# Patient Record
Sex: Male | Born: 1940 | Race: White | Hispanic: No | Smoking: Former smoker
Health system: Southern US, Community
[De-identification: ages and names within clinical notes are randomized; demographics above are authoritative.]

## PROBLEM LIST (undated history)

## (undated) DIAGNOSIS — I509 Heart failure, unspecified: Secondary | ICD-10-CM

## (undated) DIAGNOSIS — M1990 Unspecified osteoarthritis, unspecified site: Secondary | ICD-10-CM

## (undated) DIAGNOSIS — I2510 Atherosclerotic heart disease of native coronary artery without angina pectoris: Secondary | ICD-10-CM

## (undated) DIAGNOSIS — H9190 Unspecified hearing loss, unspecified ear: Secondary | ICD-10-CM

## (undated) DIAGNOSIS — E119 Type 2 diabetes mellitus without complications: Secondary | ICD-10-CM

## (undated) DIAGNOSIS — N189 Chronic kidney disease, unspecified: Secondary | ICD-10-CM

## (undated) DIAGNOSIS — R600 Localized edema: Secondary | ICD-10-CM

## (undated) HISTORY — PX: RETINAL DETACHMENT SURGERY: SHX105

## (undated) HISTORY — DX: Atherosclerotic heart disease of native coronary artery without angina pectoris: I25.10

## (undated) HISTORY — PX: CATARACT EXTRACTION: SUR2

## (undated) HISTORY — DX: Chronic kidney disease, unspecified: N18.9

---
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
## 2010-05-30 ENCOUNTER — Ambulatory Visit: Payer: Self-pay | Admitting: Internal Medicine

## 2010-06-16 ENCOUNTER — Ambulatory Visit: Payer: Self-pay | Admitting: Unknown Physician Specialty

## 2010-06-24 ENCOUNTER — Ambulatory Visit: Payer: Self-pay | Admitting: Internal Medicine

## 2010-06-26 LAB — PSA

## 2010-06-27 LAB — PROT IMMUNOELECTROPHORES(ARMC)

## 2010-06-30 ENCOUNTER — Ambulatory Visit: Payer: Self-pay | Admitting: Internal Medicine

## 2010-07-30 ENCOUNTER — Ambulatory Visit: Payer: Self-pay | Admitting: Internal Medicine

## 2011-01-06 ENCOUNTER — Encounter (HOSPITAL_COMMUNITY)
Admission: RE | Admit: 2011-01-06 | Discharge: 2011-01-06 | Disposition: A | Discharge status: Home / Self Care | Payer: Medicare Other | Source: Ambulatory Visit | Attending: Neurosurgery | Admitting: Neurosurgery

## 2011-01-06 DIAGNOSIS — Z01812 Encounter for preprocedural laboratory examination: Secondary | ICD-10-CM | POA: Insufficient documentation

## 2011-01-06 DIAGNOSIS — Z01810 Encounter for preprocedural cardiovascular examination: Secondary | ICD-10-CM | POA: Insufficient documentation

## 2011-01-06 LAB — CBC
Platelets: 221 10*3/uL (ref 150–400)
RBC: 5.21 MIL/uL (ref 4.22–5.81)
RDW: 12.5 % (ref 11.5–15.5)
WBC: 9.1 10*3/uL (ref 4.0–10.5)

## 2011-01-06 LAB — BASIC METABOLIC PANEL
Chloride: 95 mEq/L — ABNORMAL LOW (ref 96–112)
GFR calc non Af Amer: >60 mL/min (ref >60)
Potassium: 4.1 mEq/L (ref 3.5–5.1)
Sodium: 131 mEq/L — ABNORMAL LOW (ref 135–145)

## 2011-01-06 LAB — SURGICAL PCR SCREEN
MRSA, PCR: NEGATIVE
Staphylococcus aureus: NEGATIVE

## 2011-01-11 ENCOUNTER — Ambulatory Visit (HOSPITAL_COMMUNITY)
Admission: RE | Admit: 2011-01-11 | Discharge: 2011-01-11 | Disposition: A | Discharge status: Home / Self Care | Payer: Medicare Other | Source: Ambulatory Visit | Attending: Neurosurgery | Admitting: Neurosurgery

## 2011-01-11 DIAGNOSIS — Z01812 Encounter for preprocedural laboratory examination: Secondary | ICD-10-CM | POA: Insufficient documentation

## 2011-01-11 DIAGNOSIS — Z01810 Encounter for preprocedural cardiovascular examination: Secondary | ICD-10-CM | POA: Insufficient documentation

## 2011-01-11 DIAGNOSIS — Z538 Procedure and treatment not carried out for other reasons: Secondary | ICD-10-CM | POA: Insufficient documentation

## 2011-01-11 DIAGNOSIS — M5137 Other intervertebral disc degeneration, lumbosacral region: Secondary | ICD-10-CM | POA: Insufficient documentation

## 2011-01-11 DIAGNOSIS — M51379 Other intervertebral disc degeneration, lumbosacral region without mention of lumbar back pain or lower extremity pain: Secondary | ICD-10-CM | POA: Insufficient documentation

## 2011-01-11 LAB — GLUCOSE, CAPILLARY: Glucose-Capillary: 330 mg/dL — ABNORMAL HIGH (ref 70–99)

## 2011-02-14 ENCOUNTER — Ambulatory Visit: Payer: Self-pay | Admitting: Family Medicine

## 2011-02-28 ENCOUNTER — Ambulatory Visit: Payer: Self-pay | Admitting: Family Medicine

## 2011-03-31 ENCOUNTER — Ambulatory Visit: Payer: Self-pay | Admitting: Family Medicine

## 2011-10-11 IMAGING — NM NUCLEAR MEDICINE WHOLE BODY BONE SCINTIGRAPHY
1 series · 2 of 2 positions shown · non-contrast
Comparison: none

REASON FOR EXAM: abnormal L2 lesion on MRI  back pain leg weakness  eval
bone mets
COMMENTS:

[Series 1000: 3 hr wholebody · 2.40mm/px · 2 of 2 frames shown]
[frame 1/2]
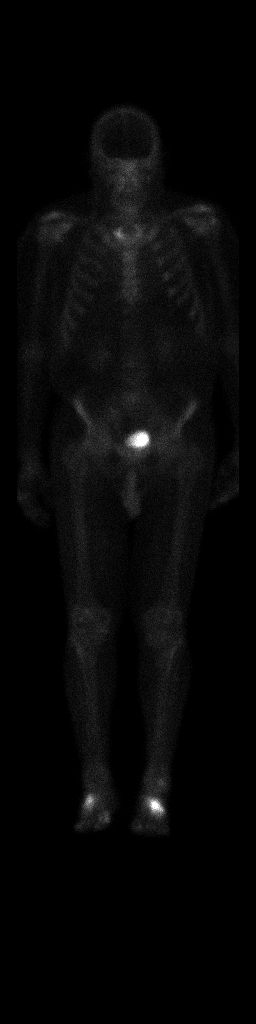
[frame 2/2]
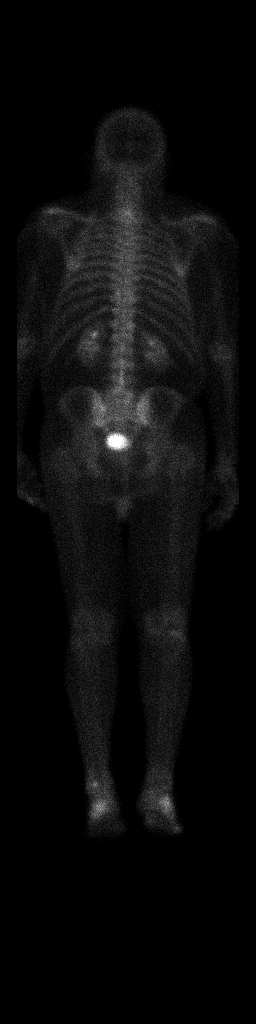

[2 of 2 positions shown; findings below may reference images not displayed]

PROCEDURE:     NM  - NM BONE WB 3 HR [DATE]  [DATE]

RESULT:     Following intravenous administration of 20.4 mCi technetium 99m
MDP, total body bone scan was performed. Following intravenous
administration of 28.4 mCi technetium 99m MDP, total body bone scan was
performed. There is observed a normal distribution of tracer activity
throughout the skeletal system. The patient has an area of abnormal signal
noted at L2 on prior MR. There is a normal distribution of tracer activity
in this region on the current bone scan which would be against an active
bony lesion such as metastatic disease. Tracer activity is visualized in
both kidneys and in the urinary bladder. There is observed increased tracer
activity in both feet consistent with arthritic change.
IMPRESSION: 1. No findings suspicious for metastatic disease are identified. Particular
attention to the L2 level of the lumbar spine shows a normal distribution of
tracer activity in that region.
2. There is increased tracer activity in both feet consistent with arthritic
change.

## 2012-12-25 ENCOUNTER — Ambulatory Visit: Payer: Self-pay | Admitting: Ophthalmology

## 2012-12-25 DIAGNOSIS — I499 Cardiac arrhythmia, unspecified: Secondary | ICD-10-CM

## 2012-12-25 LAB — BASIC METABOLIC PANEL
Anion Gap: 8 (ref 7–16)
Co2: 25 mmol/L (ref 21–32)
Creatinine: 0.86 mg/dL (ref 0.60–1.30)
EGFR (Non-African Amer.): >60
Sodium: 140 mmol/L (ref 136–145)

## 2013-01-01 ENCOUNTER — Ambulatory Visit: Payer: Self-pay | Admitting: Ophthalmology

## 2014-10-11 ENCOUNTER — Emergency Department: Payer: Self-pay | Admitting: Emergency Medicine

## 2014-10-16 ENCOUNTER — Encounter: Payer: Self-pay | Admitting: Surgery

## 2014-10-20 ENCOUNTER — Ambulatory Visit: Payer: Self-pay | Admitting: Podiatry

## 2014-10-20 LAB — WOUND AEROBIC CULTURE

## 2014-10-28 ENCOUNTER — Inpatient Hospital Stay: Payer: Self-pay | Admitting: Internal Medicine

## 2014-10-28 LAB — CBC WITH DIFFERENTIAL/PLATELET
BASOS ABS: 0 10*3/uL (ref 0.0–0.1)
BASOS PCT: 0.5 %
EOS PCT: 1.4 %
Eosinophil #: 0.1 10*3/uL (ref 0.0–0.7)
HCT: 40.4 % (ref 40.0–52.0)
HGB: 13.2 g/dL (ref 13.0–18.0)
Lymphocyte #: 1.3 10*3/uL (ref 1.0–3.6)
Lymphocyte %: 14.6 %
MCH: 28.9 pg (ref 26.0–34.0)
MCHC: 32.8 g/dL (ref 32.0–36.0)
MCV: 88 fL (ref 80–100)
Monocyte #: 0.5 x10 3/mm (ref 0.2–1.0)
Monocyte %: 5.6 %
NEUTROS ABS: 6.8 10*3/uL — AB (ref 1.4–6.5)
NEUTROS PCT: 77.9 %
Platelet: 249 10*3/uL (ref 150–440)
RBC: 4.58 10*6/uL (ref 4.40–5.90)
RDW: 13.4 % (ref 11.5–14.5)
WBC: 8.7 10*3/uL (ref 3.8–10.6)

## 2014-10-28 LAB — COMPREHENSIVE METABOLIC PANEL
ALBUMIN: 3.6 g/dL (ref 3.4–5.0)
ANION GAP: 3 — AB (ref 7–16)
AST: 14 U/L — AB (ref 15–37)
Alkaline Phosphatase: 69 U/L
BILIRUBIN TOTAL: 0.4 mg/dL (ref 0.2–1.0)
BUN: 20 mg/dL — AB (ref 7–18)
CREATININE: 0.92 mg/dL (ref 0.60–1.30)
Calcium, Total: 8.6 mg/dL (ref 8.5–10.1)
Chloride: 102 mmol/L (ref 98–107)
Co2: 31 mmol/L (ref 21–32)
EGFR (African American): >60
EGFR (Non-African Amer.): >60
Glucose: 121 mg/dL — ABNORMAL HIGH (ref 65–99)
OSMOLALITY: 276 (ref 275–301)
POTASSIUM: 4.7 mmol/L (ref 3.5–5.1)
SGPT (ALT): 14 U/L
SODIUM: 136 mmol/L (ref 136–145)
TOTAL PROTEIN: 7.3 g/dL (ref 6.4–8.2)

## 2014-10-28 LAB — HEMOGLOBIN A1C: Hemoglobin A1C: 7.4 % — ABNORMAL HIGH (ref 4.2–6.3)

## 2014-10-29 LAB — CBC WITH DIFFERENTIAL/PLATELET
Basophil #: 0 10*3/uL (ref 0.0–0.1)
Basophil %: 0.6 %
EOS PCT: 2.2 %
Eosinophil #: 0.2 10*3/uL (ref 0.0–0.7)
HCT: 40.4 % (ref 40.0–52.0)
HGB: 13.5 g/dL (ref 13.0–18.0)
LYMPHS ABS: 1.9 10*3/uL (ref 1.0–3.6)
Lymphocyte %: 26.1 %
MCH: 29.3 pg (ref 26.0–34.0)
MCHC: 33.4 g/dL (ref 32.0–36.0)
MCV: 88 fL (ref 80–100)
MONO ABS: 0.5 x10 3/mm (ref 0.2–1.0)
MONOS PCT: 7 %
NEUTROS ABS: 4.7 10*3/uL (ref 1.4–6.5)
Neutrophil %: 64.1 %
PLATELETS: 236 10*3/uL (ref 150–440)
RBC: 4.6 10*6/uL (ref 4.40–5.90)
RDW: 13.7 % (ref 11.5–14.5)
WBC: 7.4 10*3/uL (ref 3.8–10.6)

## 2014-10-29 LAB — BASIC METABOLIC PANEL
ANION GAP: 7 (ref 7–16)
BUN: 18 mg/dL (ref 7–18)
CHLORIDE: 105 mmol/L (ref 98–107)
Calcium, Total: 8.7 mg/dL (ref 8.5–10.1)
Co2: 21 mmol/L (ref 21–32)
Creatinine: 0.74 mg/dL (ref 0.60–1.30)
EGFR (African American): >60
GLUCOSE: 112 mg/dL — AB (ref 65–99)
Osmolality: 269 (ref 275–301)
POTASSIUM: 5.2 mmol/L — AB (ref 3.5–5.1)
Sodium: 133 mmol/L — ABNORMAL LOW (ref 136–145)

## 2014-10-30 ENCOUNTER — Encounter: Payer: Self-pay | Admitting: Surgery

## 2014-11-02 LAB — CULTURE, BLOOD (SINGLE)

## 2014-11-30 ENCOUNTER — Encounter: Payer: Self-pay | Admitting: Surgery

## 2015-02-19 NOTE — Op Note (Signed)
PATIENT NAME:  Aaron Sosa, Aaron Sosa MR#:  [id number] DATE OF BIRTH:  July 16, 1941  DATE OF PROCEDURE:  01/01/2013  PROCEDURES PERFORMED: 1. Pars plana vitrectomy of the left eye.  2. Panretinal photocoagulation of the left eye.  3. Phacoemulsification and intraocular lens insertion of the left eye.  4. Indirect panretinal photocoagulation of the right eye.   PREOPERATIVE DIAGNOSES: 1. Proliferative diabetic retinopathy, both eyes.   2. Vitreous hemorrhage of the left eye.   PRIMARY SURGEON: Aron Baba, M.D.   ANESTHESIA: General endotracheal anesthesia and a supplemental retrobulbar block of the left eye.   COMPLICATIONS: None.   ESTIMATED BLOOD LOSS: Less than 1 mL.   INDICATIONS FOR PROCEDURE: This is a patient who came to my office and had undergone multiple lasers in his left eye for multiple rounds of panretinal photocoagulation of his  left eye. The patient had a repeat vitreous hemorrhage and continued proliferation of proliferative diabetic membranes. The patient also has proliferative diabetic retinopathy in the right eye and extreme anxiety regarding laser in the office.   Risks, benefits and alternatives of the above procedures were discussed, and the patient wished to proceed.   DETAILS: After informed consent was obtained, the patient was brought to the operative suite at [facility]. The patient was placed in supine position, was induced by the anesthesia team without complication. Indirect panretinal photocoagulation was performed on the right eye with a total pulsation of 2982 spots in a 360-degree pattern from the temporal arcades out to the ora serrata for 360 degrees.   Attention was turned to the left eye.    A retrobulbar block was performed on the left eye by the primary surgeon without complications. The left eye was prepped and draped in a sterile manner. After a lid speculum was inserted, a side-port wound was created at approximately 10:30  in the cornea. DisCoVisc was injected into the anterior chamber in order to maintain it. A main corneal wound was created at approximately 12 o'clock. A cystotome was introduced in the eye, and a continuous 360-degree anterior capsulorrhexis was created. The lens was hydrodissected and rotated for 90 degrees. The lens was broken into 4 quadrants with the phacoemulsification wand and each of the quadrants was removed. Remnant cortical material was removed using INA. DisCoVisc was injected into the capsular bag. A 26.5-diopter Tecnis ZCB00 lens, serial #[id number], was introduced into the capsular bag and rotated into position. A 10-0 nylon stitch was placed in the main corneal wound. The DisCoVisc was removed using INA, and the suture was tied and the knot was rotated into the cornea. The side-port wound was hydrated, and the wounds were noted to be watertight.   Attention was turned to the pars plana vitrectomy portion of the case.   A 23-gauge trocar was placed inferotemporally through displaced conjunctiva 3 mm beyond the limbus in an oblique fashion. The infusion cannula was turned on and inserted through the trocar and secured into position with Steri-Strips. Two more trocars were placed in a similar fashion, superotemporally and superonasally. The vitreous cutter and light pipe were introduced in the eye, and a core vitrectomy was performed. The vitreous face was attempted to be elevated off of the retina without any success, given adherence due to multiple lasers. Preservative-free triamcinolone was injected into the vitreous chamber and removed in order to stain the vitreous face. A combination of forceps and vitreous cutter were utilized in order to elevate the vitreous face off of the retina completely. Extreme  care was taken to avoid any tears in the retina at the points of adhesion due to the proliferative membranes. These membranes were isolated and trimmed down. Once the vitreous face was  completely elevated and removed for 360 degrees, panretinal photocoagulation was performed for 360 degrees, with a total of 1651 spots. It should be noted that there was already extensive laser in the eye. Once this was complete, a scleral depressed exam was performed for 360 degrees, and no signs of any breaks, tears or retinal detachment could be identified. A partial air-fluid exchange was performed. The trocars were removed and noted to be airtight. Pressure in the eye was confirmed to be approximately 15 mmHg, and 5 mg of dexamethasone was given into the inferior fornix. The lid speculum was removed and the eye was cleaned. TobraDex was placed in the eye. A patch and shield were placed over the eye, and the patient was reversed from anesthesia. The patient was taken to postanesthesia care with instructions to remain head-up.     ____________________________ Ignacia Felling. Champ Mungo, MD mfa:dm D: 01/01/2013 10:45:00 ET T: 01/01/2013 11:13:25 ET JOB#: [id number]  cc: Ignacia Felling. Champ Mungo, MD, <Dictator> Cline Cools MD ELECTRONICALLY SIGNED 01/15/2013 9:27

## 2015-02-20 NOTE — H&P (Signed)
PATIENT NAME:  Aaron Sosa, Aaron Sosa MR#:  [id number] DATE OF BIRTH:  04-05-1941  DATE OF ADMISSION:  10/28/2014  PRIMARY CARE PHYSICIAN: Teena Irani. Terance Hart, MD  REFERRING PHYSICIAN: Zollie BeckersAnnamarie Major, III, MD at the wound care center.  CHIEF COMPLAINT: Nonhealing left foot wound.   HISTORY OF PRESENT ILLNESS: This very pleasant 74 year old man with a past medical history of uncontrolled diabetes presents today from the wound care center upon the request of Dr. Lawerance Bach for admission due to a nonhealing left foot wound. The patient reports that he has had this wound for about 7 to 8 months. He was hospitalized in [facility] 2 to 3 weeks ago where he was treated with IV antibiotics and had x-rays of his foot. He was not admitted, only treated in the Emergency Room. He was discharged with clindamycin. He then followed up with Dr. Lawerance Bach at the wound care center, who has been treating him with Keflex  and bactrim for the past 2 weeks. The patient has failed outpatient wound care and is being admitted for further evaluation and treatment. There is significant concern for osteomyelitis in this long-standing nonhealing wound. The patient has had subjective chills and fevers, no measured temperatures. He also reports that he has had diarrhea for the past few days with multiple loose stools daily. No hematochezia or melena. No nausea or vomiting. He does report 5/10 pain in the foot which has decreased his physical activity.   PAST MEDICAL HISTORY:  1.  Diabetes mellitus type 2, uncontrolled.  2.  Cataracts.  3.  History of back problems.   PAST SURGICAL HISTORY: Repair of left wrist at age 86.   SOCIAL HISTORY: The patient lives alone. He does have a daughter who lives nearby. He is a retired [employer] architect. He does not use a cane, walker, or oxygen.  CODE STATUS: He is a full code.   FAMILY MEDICAL HISTORY: The patient has a brother with coronary artery disease and atrial fibrillation. His father had a stroke.  He does not know anything about his mother's medical history.   ALLERGIES: PATIENT IS ALLERGIC TO CODEINE, SHELLFISH.   OUTPATIENT MEDICATIONS: 1.  Metformin 500 mg twice a day.  2.  Lisinopril 10 mg 0.5 tablets once a day.  3.  Keflex 1 tablet 4 times a day.  4.  Bactrim 1 tablet 2 times a day.   REVIEW OF SYSTEMS:  CONSTITUTIONAL: Positive for subjective fevers and chills. Negative for fatigue, weakness, or weight change. Positive for pain.  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 Negative for pain in either eye. No pain in the ears. 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 No sore throat, postnasal drip, difficulty swallowing.  RESPIRATORY: No cough, wheezing, hemoptysis, painful respirations. No history of COPD or TB.  CARDIOVASCULAR: No chest pain, orthopnea, edema, palpitations, or syncope.  GASTROINTESTINAL: Positive for diarrhea. Negative for nausea, vomiting, abdominal pain, hematemesis, melena, or hematochezia.  GENITOURINARY: No dysuria or frequency.  MUSCULOSKELETAL: Positive as noted before for pain in the left foot due to a nonhealing ulcer. Otherwise no new joint pains, no joint effusions, no change in activity.  NEUROLOGIC: No focal numbness or weakness. No headache, seizure, confusion.  PSYCHIATRIC: No uncontrolled anxiety or depression. No history of ADD, bipolar, or schizophrenia.   PHYSICAL EXAMINATION:  VITAL SIGNS: Temperature 97.8, pulse 72, respirations 18, blood pressure 160/84, oxygenation 98% on room  air.  GENERAL: No acute distress. The patient is sitting up comfortably conversing in the hospital bed.  HEENT: Pupils equal, round, and reactive to light. Conjunctivae are clear. Left eye does not track with the right; it deviates outward. Extraocular motion is intact. Oral mucous membranes pink and moist. Poor dentition. Posterior oropharynx is clear with no exudate, edema, or  erythema.  NECK: No cervical lymphadenopathy. Trachea midline.  RESPIRATORY: Lungs clear to auscultation bilaterally with good air movement.  CARDIOVASCULAR: Regular rate and rhythm. No murmurs, rubs, or gallops. No carotid bruit. Peripheral pulses are diminished at 1+. No peripheral edema.  ABDOMEN: Soft, nontender, nondistended. Bowel sounds are normal. No guarding or rebound, no hepatosplenomegaly.  MUSCULOSKELETAL: There are no joint effusions. Range of motion is normal in all joints. Strength is 5/5 throughout.  SKIN: There are 2 wounds on the left foot at the lateral and plantar aspect of the MTP joint area. These have recently been debrided and have no necrotic tissue. There is yellow purulent-looking material coming from the lateral wound, good granulation tissue on the wound on the plantar surface. Both wounds are about 1 x 1 cm. There is minimal surrounding erythema. There is trace edema over that area of the foot.  NEUROLOGIC: Cranial nerves II through XII are grossly intact. Strength and sensation are intact. Tone is normal.  PSYCHIATRIC: The patient is alert and oriented x 4. He has good insight into his clinical condition,   LABORATORY DATA: Sodium 136, potassium 4.7, chloride 102, bicarbonate 31, glucose 121, BUN 20, creatinine 0.92. Hemoglobin A1c is 7.4. LFTs are normal. White blood cell count 8.7, hemoglobin 13.2, platelets 249,000, MCV is 88.   IMAGING: MRI of the foot is pending.   ASSESSMENT AND PLAN:  1.  Nonhealing left foot wound, possible osteomyelitis: I have ordered blood cultures, which are pending. Wound culture pending. MRI of the foot is pending. We will start empiric antibiotics with vancomycin and Zosyn. Have ordered a wound care referral for instructions on wound dressing.  2.  Diabetes mellitus: Hemoglobin A1c is 7.4, indicating fair control. Will start sliding scale insulin while inpatient. His renal function is excellent and he could continue on metformin as an  outpatient. This wound does not seem to be due to uncontrolled diabetes.  3.  Hypertension: The patient does not recall a history of hypertension. I will go ahead and start lisinopril at 20 mg daily in this diabetic with hypertension. We will continue to monitor his blood pressure throughout his admission.  4.  Prophylaxis: The patient is not critically ill and does not need gastrointestinal prophylaxis. Will start heparin for deep vein thrombosis prophylaxis.  TIME SPENT ON ADMISSION: 40 minutes.   ____________________________ Ena Dawley. Clent Ridges, MD cpw:ST D: 10/28/2014 14:44:21 ET T: 10/28/2014 15:52:24 ET JOB#: [id number]  cc: Ena Dawley. Clent Ridges, MD, <Dictator> Gale Journey MD ELECTRONICALLY SIGNED 10/28/2014 22:04

## 2015-02-20 NOTE — Consult Note (Signed)
Admit Diagnosis:   OSTEOMYELITIS LT FOOT: Onset Date: 29-Oct-2014, Status: Active, Description: OSTEOMYELITIS LT FOOT    Diabetes:   Home Medications: Medication Instructions Status  amoxicillin-clavulanate 875 mg-125 mg oral tablet 1 tab  orally 2 times a day x 20 days Active  lisinopril 10 mg oral tablet 0.5 tab(s) orally once a day Active  metFORMIN 500 mg oral tablet 1 tab(s) orally 2 times a day Active   Lab Results: Routine Micro:  30-Dec-15 11:09   Micro Text Report BLOOD CULTURE   COMMENT                   NO GROWTH IN 18-24 HOURS   ANTIBIOTIC                       Micro Text Report BLOOD CULTURE   COMMENT                   NO GROWTH IN 18-24 HOURS   ANTIBIOTIC                       Routine Chem:  30-Dec-15 11:09   Hemoglobin A1c ([facility])  7.4 (The American Diabetes Association recommends that a primary goal of therapy should be <7% and that physicians should reevaluate the treatment regimen in patients with HbA1c values consistently >8%.)  31-Dec-15 05:18   BUN 18  Creatinine (comp) 0.74  Routine Hem:  31-Dec-15 05:18   WBC (CBC) 7.4   Radiology Results:  Radiology Results: XRay:    13-Dec-15 10:12, Foot Left Complete  Foot Left Complete  REASON FOR EXAM:    erythematous wound, tender  COMMENTS:       PROCEDURE: DXR - DXR FOOT LT COMP W/OBLIQUES  - Oct 11 2014 10:12AM     CLINICAL DATA:  large open wound at the base of his great toe with  swelling, reddness, and painful to touch or apply pressure to.  Patient scrapped foot on a nail several months ago and wound has not  gotten any better. Patient has HX of diabetes.    EXAM:  LEFT FOOT - COMPLETE 3+ VIEW    COMPARISON:  None.  FINDINGS:  Skin defect at the plantar aspect of the first MTP joint without  focal cortical loss to suggest osteomyelitis. No radiodense foreign  body. Degenerative sclerosis and dorsal spurring at the second and  third tarsometatarsal joints. Otherwise normal alignment  and  mineralization. Negative for fracture or dislocation. Calcaneal spur  at the plantar aponeurosis. Patchy arterial calcifications.     IMPRESSION:  1. Plantar soft tissue defect without radiographic evidence of  osteomyelitis or foreign body.      Electronically Signed    By: Oley Balm M.D.    On: 10/11/2014 10:35         Verified By: Philis Fendt, M.D.,  MRI:    30-Dec-15 14:50, MRI Foot Left Without Contrast  MRI Foot Left Without Contrast  REASON FOR EXAM:    non healing wound. Please see orders by Luvenia Starch   for 10/29/14  COMMENTS:       PROCEDURE: MR  - MR FOOT LEFT  WO CONTRAST  - Oct 28 2014  2:50PM     CLINICAL DATA:  Diabetic with nonhealing wound involving the plantar  aspect of the great toe pain after stepping on a nail 2 weeks ago.  Evaluate for osteomyelitis. Initial encounter.    EXAM:  MRI OF THE LEFT FOREFOOT WITHOUT CONTRAST    TECHNIQUE:  Multiplanar, multisequence MR imaging was performed. No intravenous  contrast was administered.    COMPARISON:  Radiographs 10/11/2014 and 10/12/2014.    FINDINGS:  As correlated with the prior radiographs, there is soft tissue  ulceration along the the plantar aspect of the first metatarsal  phalangeal joint. There is a small focus of susceptibility artifact  a plantar to the proximal phalanx which likely represents soft  tissue emphysema based on the prior radiographs. No definite foreign  bodies are seen in correlation with the radiographs. There may be  some soft tissue ulceration medial to metatarsal phalangeal joint as  well. There is ill-defined edema within the underlying subcutaneous  fat, but no focal fluid collection.  There are mild degenerative changes of the first metatarsal  phalangeal joint. There isno evidence of osteomyelitis. The  additional metatarsal phalangeal joints appear normal.    Moderately advanced degenerative changes are present within the  midfoot, greatest at  the second and third tarsometatarsal  articulations. The Lisfranc ligament is intact. There is no  subluxation. Degenerative changes are also present at articulation  between the medial cuneiform and the navicular.    There is generalized forefoot soft tissue edema and muscular  atrophy. No tendon abnormalities identified.     IMPRESSION:  1. Soft tissue ulceration plantar to the first metatarsal phalangeal  joint with probable focal soft tissue emphysema as correlated with  prior radiographs. No evidence of foreign body or abscess.  2. No evidence of osteomyelitis or septic joint.  3. Arthropathic changes at the first metatarsal phalangeal joint and  throughout the midfoot.      Electronically Signed    By: Roxy Horseman M.D.    On: 10/28/2014 15:08         Verified By: Gerrianne Scale, M.D.,    Codeine: Anxiety  Shellfish: Blurred Vision, Swelling  Nursing Flowsheets: **Vital Signs.:   31-Dec-15 07:45  Temperature Temperature (F) 97.9    General Aspect Asked to see pt for left foot ulceration.  Has been present for a few weeks.  Seen in wound clinic and sent to hospital for possible osteomyelitis.  Hx of DM with neuropathy.   Case History and Physical Exam:  Cardiovascular Non palpable dp/ pt pulse.   Musculoskeletal Mild edema to left foot   Neurological Grossly neuropathic to lower legs   Skin Noted granular plantar 1st mtpj ulceration with secondary ulcer to medial 1st mtpj with mixed fibrotic tissue.  No purulence.  No fould odor.  Mild erythema surrounding wound as expected.  No lymphangitic streaking.    Impression Pt with diabetic neuropathic ulceration.  Improved per family. Negative for osteomyelitis on MRI. Seeing wound care center. No need for acute debridment at this time. would recommend vascular surgery consult outpt. Has post op shoe with diabetic insert at this time.  May require further off loading outpt. OK for d/c from podiatry  standpoint. Can f/u with me in 1-2 weeks.  Pt states will f/u with wound center as well.   Electronic Signatures: Gwyneth Revels (MD)  (Signed 31-Dec-15 16:02)  Authored: Health Issues, Significant Events - History, Home Medications, Labs, Radiology Results, Allergies, Vital Signs, General Aspect/Present Illness, History and Physical Exam, Impression/Plan   Last Updated: 31-Dec-15 16:02 by Gwyneth Revels (MD)

## 2015-02-24 NOTE — Discharge Summary (Signed)
PATIENT NAME:  Aaron Sosa, HICKAM MR#:  [id number] DATE OF BIRTH:  1941/01/22  DATE OF ADMISSION:  10/28/2014 DATE OF DISCHARGE:  10/29/2014  ADMITTING PHYSICIAN:  Santina Evans P. Clent Ridges, MD  DISCHARGING PHYSICIAN:  Enid Baas, MD  PRIMARY CARE PHYSICIAN:  Teena Irani. Terance Hart, MD    CONSULTATIONS IN THE HOSPITAL:   1.  Podiatric consultation by Dr. Argentina Donovan. Fowler.  2.  ID consultation by Dr. Stann Mainland. Fitzgerald.   DISCHARGE DIAGNOSES:  1.  Left toe diabetic foot ulcer, no evidence of osteomyelitis.  2.  Non-insulin-dependent diabetes mellitus.  3.  Hypertension.  4.  Hyperkalemia in the hospital.   DISCHARGE HOME MEDICATIONS:  1.  Metformin 500 mg p.o. b.i.d.  2.  Lisinopril 5 mg p.o. daily.  3.  Augmentin 875/125 mg 1 tablet p.o. b.i.d. for 3 weeks.   DISCHARGE DIET:  ADA, 1800-calorie diet.   DISCHARGE ACTIVITY:  As tolerated.   FOLLOWUP INSTRUCTIONS: 1.  Follow up with podiatry in 1 to 2 weeks.  2.  Follow up with Dr. Sampson Goon of ID in 2 weeks.  3.  PCP followup in 2 weeks.   LABORATORIES AND IMAGING STUDIES PRIOR TO DISCHARGE:  WBC was 7.4, hemoglobin 13.5, hematocrit 40.4, and platelet count 236,000.   Sodium was 133, potassium 5.2, chloride 105, bicarbonate 21, BUN 18, creatinine 0.74, glucose 112, and calcium 8.7.   Blood cultures are negative since admission.   MRI of the left foot showed soft tissue ulceration under the first metatarsophalangeal joint on the plantar surface. No evidence of foreign body. No evidence of abscess. No evidence of osteomyelitis or septic joint noted.   BRIEF HOSPITAL COURSE:  Mr. Gater is a very pleasant 74 year old Caucasian male with past medical history significant for non-insulin-dependent diabetes mellitus and hypertension, who presented to the hospital from the wound clinic secondary to nonhealing left toe ulcer, and since he was a diabetic, they thought he needed IV antibiotics.   1.  Diabetic foot ulcer. His MRI showed no  evidence of osteomyelitis. They were not deep enough. One of the plantar surface ulcers appeared cleaned up by the wound care clinic, and there was a lateral toe ulcer on the same side, which had a little bit of pus 2 weeks ago. Cultures were growing enterococcus, but the patient was not on the appropriate antibiotics. He was seen by ID. He was initially on vancomycin and Zosyn and changed over to Augmentin. Podiatric consult is pending for superficial debridement of the ulcer and outpatient followup. The patient has not been septic. White count is normal, he is afebrile, and he is being discharged home on Augmentin.  2.  Diabetes mellitus. Metformin is being continued.  3.  Hypertension, on lisinopril.  4.  Hyperkalemia. The patient is on lisinopril and Bactrim as an outpatient. Potassium is only 5.2, so no further medications were added. Lisinopril was held for a day, and he is being discharged home.   DISCHARGE CONDITION:  Stable.   DISCHARGE DISPOSITION:  Home.   TIME SPENT ON DISCHARGE:  45 minutes.   ____________________________ Enid Baas, MD rk:nb D: 10/29/2014 15:21:14 ET T: 10/29/2014 22:44:17 ET JOB#: [id number]   cc: Enid Baas, MD, <Dictator> Teena Irani. Terance Hart, MD Stann Mainland. Sampson Goon, MD Argentina Donovan. Ether Griffins, DPM  Enid Baas MD ELECTRONICALLY SIGNED 11/16/2014 16:14

## 2015-02-24 NOTE — Consult Note (Signed)
PATIENT NAME:  Aaron Sosa, Aaron Sosa MR#:  [id number] DATE OF BIRTH:  1941-10-09  DATE OF CONSULTATION:  10/29/2014  REFERRING PHYSICIAN:  Enid Baas, MD  CONSULTING PHYSICIAN:  Stann Mainland. Sampson Goon, MD  REASON FOR CONSULTATION: Diabetic foot ulcer.   HISTORY OF PRESENT ILLNESS: This is a very pleasant 74 year old gentleman with decently controlled diabetes, as well as hypertension, who has been following at the wound center for several months with a nonhealing wound on his left foot. He states that he initially scraped his toe on a nail many months ago. He then took a bath in his bathtub to try to clean it. Unfortunately, he had had some sewage back-up into that bathtub prior, but he had cleaned it well. He is worried that that caused some infection in the foot. He has been followed at the wound center and has had repeated debridement. He has also been seen once at [facility], where he had a days' worth of IV antibiotics. He was discharged at that time on clindamycin. He then has been treated with Keflex and Bactrim for the last 2 weeks at the wound care center. The wound has been worsening and there was concern for osteomyelitis, so he was admitted. He has also been having some mild diarrhea. He has had no real fevers or chills.    PAST MEDICAL HISTORY:  1.  Diabetes.  2.  Cataracts.  3.  Back problem.   PAST SURGICAL HISTORY: Left wrist repair.   SOCIAL HISTORY: Lives alone, has a daughter nearby, retired Naval architect.   FAMILY HISTORY: Noncontributory.   ALLERGIES: HE IS ALLERGIC TO CODEINE AND SHELLFISH.   ANTIBIOTICS SINCE ADMISSION: Include Zosyn and vancomycin. He was on Bactrim and Keflex prior.  REVIEW OF SYSTEMS: Eleven systems reviewed and negative except as per HPI.   PHYSICAL EXAMINATION: VITAL SIGNS: Temperature 97.9, pulse 71, blood pressure 106/61, respirations 18, saturation 96% on room air.  GENERAL: He is pleasant, interactive, in no acute distress.  HEENT:  Pupils equal, round and reactive to light and accommodation. Oropharynx is clear. Neck is supple. HEART: Regular.  LUNGS: Clear.  ABDOMEN: Soft, nontender, nondistended.  EXTREMITIES: On his left lower extremity, he has a wound laterally over the great toe metatarsal head. He also has one on the plantar surface. These have chronic changes. There are dry, however. There is no significant drainage. There is no surrounding erythema, but there is some desquamation of the skin around it.   LABORATORY DATA: White blood count 7.4, hemoglobin 13.5, platelets 236. LFTs normal. Renal function normal. Creatinine 0.74. Blood cultures x 2 are negative.   IMAGING STUDIES: MRI shows no evidence of osteomyelitis, but there is soft tissue ulceration with a probable focal soft tissue emphysema. No evidence of foreign body or abscess. Prior culture data from December 18th grew Enterococcus faecalis and AVM, both sensitive to amoxicillin. There was also methicillin sensitive Staphylococcus aureus and Escherichia coli, also sensitive to ampicillin.   IMPRESSION: A 75 year old gentleman with a nonhealing left foot ulcer and a diabetic. He had an initial injury on a nail several months ago. He has been treated with various antibiotics, including clindamycin, Bactrim, and Keflex.   I do not think this is a severely infected wound with no evidence of osteomyelitis. He has no fever or white count. He is likely not responding because the antibiotics he is on now would not cover enterococcus, which was isolated from his recent cultures.   RECOMMENDATIONS: 1. Podiatry consult.  2.  When he is ready for discharge, I would send him home on Augmentin 875 twice a day. I would recommend at least a 3-week course. I can see him in follow-up at that time.  3. I have discontinued his vancomycin, but continue the Zosyn until he is ready for discharge.  4. Thank you for the consult. I will be glad to follow with you     ____________________________ Stann Mainland. Sampson Goon, MD dpf:mw D: 10/29/2014 10:13:00 ET T: 10/29/2014 11:26:40 ET JOB#: [id number]  cc: Stann Mainland. Sampson Goon, MD, <Dictator> Laelah Siravo Sampson Goon MD ELECTRONICALLY SIGNED 11/01/2014 21:26

## 2015-04-28 ENCOUNTER — Encounter
Admission: RE | Admit: 2015-04-28 | Discharge: 2015-04-28 | Disposition: A | Discharge status: Home / Self Care | Payer: PPO | Source: Ambulatory Visit | Attending: Ophthalmology | Admitting: Ophthalmology

## 2015-04-28 DIAGNOSIS — Z01812 Encounter for preprocedural laboratory examination: Secondary | ICD-10-CM | POA: Diagnosis present

## 2015-04-28 DIAGNOSIS — Z01810 Encounter for preprocedural cardiovascular examination: Secondary | ICD-10-CM | POA: Insufficient documentation

## 2015-04-28 LAB — POTASSIUM: POTASSIUM: 4.1 mmol/L (ref 3.5–5.1)

## 2015-04-29 ENCOUNTER — Encounter: Payer: Self-pay | Admitting: *Deleted

## 2015-05-06 ENCOUNTER — Ambulatory Visit: Payer: PPO | Admitting: Anesthesiology

## 2015-05-06 ENCOUNTER — Ambulatory Visit
Admission: RE | Admit: 2015-05-06 | Discharge: 2015-05-06 | Disposition: A | Discharge status: Home / Self Care | Payer: PPO | Source: Ambulatory Visit | Attending: Ophthalmology | Admitting: Ophthalmology

## 2015-05-06 ENCOUNTER — Encounter
Admission: RE | Disposition: A | Discharge status: Home / Self Care | Payer: Self-pay | Source: Ambulatory Visit | Attending: Ophthalmology

## 2015-05-06 ENCOUNTER — Encounter: Payer: Self-pay | Admitting: Anesthesiology

## 2015-05-06 DIAGNOSIS — M1990 Unspecified osteoarthritis, unspecified site: Secondary | ICD-10-CM | POA: Diagnosis not present

## 2015-05-06 DIAGNOSIS — Z79899 Other long term (current) drug therapy: Secondary | ICD-10-CM | POA: Insufficient documentation

## 2015-05-06 DIAGNOSIS — E119 Type 2 diabetes mellitus without complications: Secondary | ICD-10-CM | POA: Diagnosis not present

## 2015-05-06 DIAGNOSIS — Z91013 Allergy to seafood: Secondary | ICD-10-CM | POA: Diagnosis not present

## 2015-05-06 DIAGNOSIS — I10 Essential (primary) hypertension: Secondary | ICD-10-CM | POA: Insufficient documentation

## 2015-05-06 DIAGNOSIS — H2511 Age-related nuclear cataract, right eye: Secondary | ICD-10-CM | POA: Insufficient documentation

## 2015-05-06 DIAGNOSIS — Z885 Allergy status to narcotic agent status: Secondary | ICD-10-CM | POA: Insufficient documentation

## 2015-05-06 DIAGNOSIS — Z87891 Personal history of nicotine dependence: Secondary | ICD-10-CM | POA: Diagnosis not present

## 2015-05-06 HISTORY — DX: Unspecified hearing loss, unspecified ear: H91.90

## 2015-05-06 HISTORY — DX: Type 2 diabetes mellitus without complications: E11.9

## 2015-05-06 HISTORY — DX: Unspecified osteoarthritis, unspecified site: M19.90

## 2015-05-06 HISTORY — PX: CATARACT EXTRACTION W/PHACO: SHX586

## 2015-05-06 HISTORY — DX: Localized edema: R60.0

## 2015-05-06 LAB — GLUCOSE, CAPILLARY: Glucose-Capillary: 131 mg/dL — ABNORMAL HIGH (ref 65–99)

## 2015-05-06 SURGERY — PHACOEMULSIFICATION, CATARACT, WITH IOL INSERTION
Anesthesia: Monitor Anesthesia Care | Laterality: Right

## 2015-05-06 MED ORDER — NA HYALUR & NA CHOND-NA HYALUR 0.55-0.5 ML IO KIT
PACK | INTRAOCULAR | Status: AC
  Filled 2015-05-06: qty 1.05

## 2015-05-06 MED ORDER — TETRACAINE HCL 0.5 % OP SOLN
OPHTHALMIC | Status: AC
  Filled 2015-05-06: qty 2

## 2015-05-06 MED ORDER — LIDOCAINE HCL (PF) 4 % IJ SOLN
INTRAMUSCULAR | Status: AC
  Filled 2015-05-06: qty 5

## 2015-05-06 MED ORDER — EPINEPHRINE HCL 1 MG/ML IJ SOLN
INTRAMUSCULAR | Status: AC
  Filled 2015-05-06: qty 1

## 2015-05-06 MED ORDER — MIDAZOLAM HCL 2 MG/2ML IJ SOLN
INTRAMUSCULAR | Status: DC | PRN
  Administered 2015-05-06: 1 mg via INTRAVENOUS

## 2015-05-06 MED ORDER — CYCLOPENTOLATE HCL 2 % OP SOLN
1.0000 [drp] | OPHTHALMIC | Status: AC
  Administered 2015-05-06 (×4): 1 [drp] via OPHTHALMIC

## 2015-05-06 MED ORDER — SODIUM CHLORIDE 0.9 % IV SOLN
INTRAVENOUS | Status: DC
  Administered 2015-05-06: 08:00:00 via INTRAVENOUS

## 2015-05-06 MED ORDER — PHENYLEPHRINE HCL 10 % OP SOLN
1.0000 [drp] | OPHTHALMIC | Status: AC
  Administered 2015-05-06 (×2): 1 [drp] via OPHTHALMIC

## 2015-05-06 MED ORDER — BSS IO SOLN
INTRAOCULAR | Status: DC | PRN
  Administered 2015-05-06: 150 mL via INTRAOCULAR

## 2015-05-06 MED ORDER — TRYPAN BLUE 0.06 % OP SOLN
OPHTHALMIC | Status: AC
  Filled 2015-05-06: qty 0.5

## 2015-05-06 MED ORDER — MOXIFLOXACIN HCL 0.5 % OP SOLN
OPHTHALMIC | Status: DC | PRN
Start: 2015-05-06 — End: 2015-05-06
  Administered 2015-05-06: 2 [drp]

## 2015-05-06 MED ORDER — CYCLOPENTOLATE HCL 2 % OP SOLN
OPHTHALMIC | Status: AC
  Filled 2015-05-06: qty 2

## 2015-05-06 MED ORDER — TETRACAINE HCL 0.5 % OP SOLN
1.0000 [drp] | Freq: Once | OPHTHALMIC | Status: AC
  Administered 2015-05-06: 1 [drp] via OPHTHALMIC

## 2015-05-06 MED ORDER — LIDOCAINE HCL (PF) 4 % IJ SOLN
INTRAMUSCULAR | Status: DC | PRN
  Administered 2015-05-06: 1 mL

## 2015-05-06 MED ORDER — CEFUROXIME OPHTHALMIC INJECTION 1 MG/0.1 ML
INJECTION | OPHTHALMIC | Status: DC | PRN
  Administered 2015-05-06: 0.1 mL via INTRACAMERAL

## 2015-05-06 MED ORDER — LACTATED RINGERS IV SOLN: INTRAVENOUS | Status: DC

## 2015-05-06 MED ORDER — LIDOCAINE HCL (PF) 1 % IJ SOLN: INTRAOCULAR | Status: DC | PRN

## 2015-05-06 MED ORDER — CEFUROXIME OPHTHALMIC INJECTION 1 MG/0.1 ML
INJECTION | OPHTHALMIC | Status: AC
  Filled 2015-05-06: qty 0.1

## 2015-05-06 MED ORDER — PHENYLEPHRINE HCL 10 % OP SOLN
OPHTHALMIC | Status: AC
  Filled 2015-05-06: qty 5

## 2015-05-06 MED ORDER — MOXIFLOXACIN HCL 0.5 % OP SOLN
OPHTHALMIC | Status: AC
  Filled 2015-05-06: qty 3

## 2015-05-06 MED ORDER — FENTANYL CITRATE (PF) 100 MCG/2ML IJ SOLN
INTRAMUSCULAR | Status: DC | PRN
  Administered 2015-05-06: 50 ug via INTRAVENOUS

## 2015-05-06 MED ORDER — MOXIFLOXACIN HCL 0.5 % OP SOLN
1.0000 [drp] | OPHTHALMIC | Status: AC
  Administered 2015-05-06 (×3): 1 [drp] via OPHTHALMIC

## 2015-05-06 MED ORDER — NA HYALUR & NA CHOND-NA HYALUR 0.4-0.35 ML IO KIT
PACK | INTRAOCULAR | Status: DC | PRN
  Administered 2015-05-06: .75 mL via INTRAOCULAR

## 2015-05-06 MED ORDER — NEOMYCIN-POLYMYXIN-DEXAMETH 3.5-10000-0.1 OP OINT
TOPICAL_OINTMENT | OPHTHALMIC | Status: DC | PRN
  Administered 2015-05-06: 1 via OPHTHALMIC

## 2015-05-06 SURGICAL SUPPLY — 23 items
CUP MEDICINE 2OZ PLAST GRAD ST (MISCELLANEOUS) ×3 IMPLANT
EYE SHIELD UNIVERSAL CLEAR (GAUZE/BANDAGES/DRESSINGS) ×3 IMPLANT
GLOVE BIO SURGEON STRL SZ7 (GLOVE) ×3 IMPLANT
GLOVE SURG LX 6.5 MICRO (GLOVE) ×4
GLOVE SURG LX STRL 6.5 MICRO (GLOVE) ×2 IMPLANT
GOWN STRL REUS W/ TWL LRG LVL3 (GOWN DISPOSABLE) ×2 IMPLANT
GOWN STRL REUS W/TWL LRG LVL3 (GOWN DISPOSABLE) ×4
LENS IOL ACRSF IQ PC 20.0 (Intraocular Lens) ×1 IMPLANT
LENS IOL ACRYSOF IQ POST 20.0 (Intraocular Lens) ×3 IMPLANT
NEEDLE FILTER BLUNT 18X 1/2SAF (NEEDLE) ×2
NEEDLE FILTER BLUNT 18X1 1/2 (NEEDLE) ×1 IMPLANT
PACK CATARACT (MISCELLANEOUS) ×3 IMPLANT
PACK CATARACT BRASINGTON LX (MISCELLANEOUS) ×3 IMPLANT
PACK EYE AFTER SURG (MISCELLANEOUS) ×3 IMPLANT
SOL BSS BAG (MISCELLANEOUS) ×3
SOL PREP PVP 2OZ (MISCELLANEOUS) ×3
SOLUTION BSS BAG (MISCELLANEOUS) ×1 IMPLANT
SOLUTION PREP PVP 2OZ (MISCELLANEOUS) ×1 IMPLANT
SYR 3ML LL SCALE MARK (SYRINGE) ×6 IMPLANT
SYR 5ML LL (SYRINGE) ×3 IMPLANT
SYR TB 1ML 27GX1/2 LL (SYRINGE) ×3 IMPLANT
WATER STERILE IRR 1000ML POUR (IV SOLUTION) ×3 IMPLANT
WIPE NON LINTING 3.25X3.25 (MISCELLANEOUS) ×3 IMPLANT

## 2015-05-06 NOTE — Anesthesia Postprocedure Evaluation (Signed)
  Anesthesia Post-op Note  Patient: Arion Bufkin Malecki  Procedure(s) Performed: Procedure(s) with comments: CATARACT EXTRACTION PHACO AND INTRAOCULAR LENS PLACEMENT (IOC) (Right) - [place] 1:14.7         AP   11.2          CDE   8.43    cassette lot #[id number]  Anesthesia type:MAC  Patient location short stay  Post pain: Pain level controlled  Post assessment: Post-op Vital signs reviewed, Patient's Cardiovascular Status Stable, Respiratory Function Stable, Patent Airway and No signs of Nausea or vomiting  Post vital signs: Reviewed and stable  Last Vitals:  Filed Vitals:   05/06/15 0742  BP: 175/108  Pulse: 67  Temp: 36.4 C  Resp: 18    Level of consciousness: awake, alert  and patient cooperative  Complications: No apparent anesthesia complications

## 2015-05-06 NOTE — Discharge Instructions (Signed)
POST OPERATIVE INSTRUCTIONS  °DAY OF CATARACT SURGERY °Your surgery went well.  °Today, take it easy and protect the eye.  °Don’t bend over at the waist. °Don’t lift objects heavier than a jug of milk (10lbs). °Don’t let anything get in the eye other than the drops we give you. °Keep the eye shield on at all times except to put in the drops. °You may have a mild headache, soreness or scratchy sensation after surgery. °EYE DROPS AFTER SURGERY ° °        Durezol °Steroid (SHAKE WELL) Ilevro °Anti-inflammatory Vigamox °Antibiotic °  °    2 times a day Once daily 4 times a day  °   ° Wait 5 minutes between drops  °If you have questions, call [facility] °We will see you tomorrow in the eye clinic. ° °

## 2015-05-06 NOTE — Anesthesia Preprocedure Evaluation (Signed)
Anesthesia Evaluation  Patient identified by MRN, date of birth, ID band Patient awake    Reviewed: Allergy & Precautions, NPO status , Patient's Chart, lab work & pertinent test results, reviewed documented beta blocker date and time   Airway Mallampati: II  TM Distance: >3 FB     Dental  (+) Upper Dentures, Lower Dentures   Pulmonary former smoker,          Cardiovascular hypertension,     Neuro/Psych    GI/Hepatic   Endo/Other  diabetes, Well Controlled, Type 2  Renal/GU      Musculoskeletal  (+) Arthritis -, Osteoarthritis,    Abdominal   Peds  Hematology   Anesthesia Other Findings   Reproductive/Obstetrics                             Anesthesia Physical Anesthesia Plan  ASA: III  Anesthesia Plan: MAC   Post-op Pain Management:    Induction:   Airway Management Planned:   Additional Equipment:   Intra-op Plan:   Post-operative Plan:   Informed Consent: I have reviewed the patients History and Physical, chart, labs and discussed the procedure including the risks, benefits and alternatives for the proposed anesthesia with the patient or authorized representative who has indicated his/her understanding and acceptance.     Plan Discussed with: CRNA  Anesthesia Plan Comments:         Anesthesia Quick Evaluation

## 2015-05-06 NOTE — Op Note (Signed)
  05/06/2015  PRE-OP DIAGNOSIS: Cataract (ICD-10 H25.11) Nuclear sclerotic catarct, RIGHT EYE  Post operative diagnosis: Cataract (ICD-10 H25.11) Nuclear sclerotic cataract, RIGHT EYE  Procedure: Phacoemulsification with introcular lens [id number])   SURGEON: Surgeon(s) and Role:    * Lia Hopping, MD - Primary  ANESTHESIA: Choice   ESTIMATED BLOOD LOSS: MINIMAL  COMPLICATIONS: None  OPERATIVE DESCRIPTION:   Therapeutic options were discussed with the patient preoperatively, including a discussion of risks and benefits of surgery.  Informed consent was obtained. A dilated fundus exam was performed within 6 months.   The patient was premedicated and brought to the operating room and placed on the operating table in the supine position.  Topical tetracaine was instilled.  After adequate anesthesia, the patient was prepped and draped in the usual fashion.  A wire lid speculum was inserted and the microscope was positioned.  A sideport was used to create a paracentesis site and a mixture of preservative-free lidocaine, BSS, and epinephrine was was instilled into the anterior chamber, followed by viscoelastic.  A clear corneal incision was created using a keratome blade.  Capsulorrhexis was then performed.  In situ phacoemulsification was performed.  Cortical material was removed with the irrigation-aspiration unit.  Viscoelastic was instilled to open the capsular bag.  A posterior chamber intraocular lens, model 20.0 diopters, was inserted and positioned.  Irrigation-aspiration was used to remove all viscoelastic. Intracameral cefuroxime was injected into the eye. Wounds were checked for leakage and confirmed to be secure.  Lid speculum was removed and a shield was placed over the eye.  Patient was returned to the recovery room in stable condition. IMPLANTS:   Implant Name Type Inv. Item Serial No. Manufacturer Lot No. LRB No. Used  IMPLANT LENS - [id number] Intraocular Lens IMPLANT  LENS [id number] ALCON   Right 1     Postoperative care and discharge medication counseling was discussed with the patient or the parents prior to discharge

## 2015-05-06 NOTE — Transfer of Care (Signed)
Immediate Anesthesia Transfer of Care Note  Patient: Aaron Sosa  Procedure(s) Performed: Procedure(s) with comments: CATARACT EXTRACTION PHACO AND INTRAOCULAR LENS PLACEMENT (IOC) (Right) - [place] 1:14.7         AP   11.2          CDE   8.43    cassette lot #[id number]  Patient Location: Short Stay  Anesthesia Type:MAC  Level of Consciousness: awake, alert  and oriented  Airway & Oxygen Therapy: Patient Spontanous Breathing and Patient connected to nasal cannula oxygen  Post-op Assessment: Report given to RN and Post -op Vital signs reviewed and stable  Post vital signs: Reviewed and stable  Last Vitals: 140/72 63 hr 97% 96.5 18 r Filed Vitals:   05/06/15 0742  BP: 175/108  Pulse: 67  Temp: 36.4 C  Resp: 18    Complications: No apparent anesthesia complications

## 2015-05-06 NOTE — H&P (Signed)
The history and physical was faxed to the hospital. The history and physical was reviewed by me and no changes have occurred.   

## 2015-11-03 DIAGNOSIS — H35373 Puckering of macula, bilateral: Secondary | ICD-10-CM | POA: Diagnosis not present

## 2015-11-03 DIAGNOSIS — E113511 Type 2 diabetes mellitus with proliferative diabetic retinopathy with macular edema, right eye: Secondary | ICD-10-CM | POA: Diagnosis not present

## 2015-11-18 DIAGNOSIS — Z961 Presence of intraocular lens: Secondary | ICD-10-CM | POA: Diagnosis not present

## 2015-11-24 DIAGNOSIS — R609 Edema, unspecified: Secondary | ICD-10-CM | POA: Diagnosis not present

## 2015-11-24 DIAGNOSIS — E785 Hyperlipidemia, unspecified: Secondary | ICD-10-CM | POA: Diagnosis not present

## 2015-11-24 DIAGNOSIS — I10 Essential (primary) hypertension: Secondary | ICD-10-CM | POA: Diagnosis not present

## 2015-11-24 DIAGNOSIS — E119 Type 2 diabetes mellitus without complications: Secondary | ICD-10-CM | POA: Diagnosis not present

## 2015-12-31 DIAGNOSIS — E113593 Type 2 diabetes mellitus with proliferative diabetic retinopathy without macular edema, bilateral: Secondary | ICD-10-CM | POA: Diagnosis not present

## 2015-12-31 DIAGNOSIS — H35373 Puckering of macula, bilateral: Secondary | ICD-10-CM | POA: Diagnosis not present

## 2016-01-24 IMAGING — CR DG FOOT COMPLETE 3+V*L*
1 series · 3 of 3 positions shown · non-contrast
Comparison: None.

CLINICAL DATA: large open wound at the base of his great toe with
swelling, reddness, and painful to touch or apply pressure to.
Patient scrapped foot on a nail several months ago and wound has not
gotten any better. Patient has HX of diabetes.

EXAM:
LEFT FOOT - COMPLETE 3+ VIEW

[Series 1: ap · 0.17mm/px · 3 of 3 slices shown]
[im 1/3]
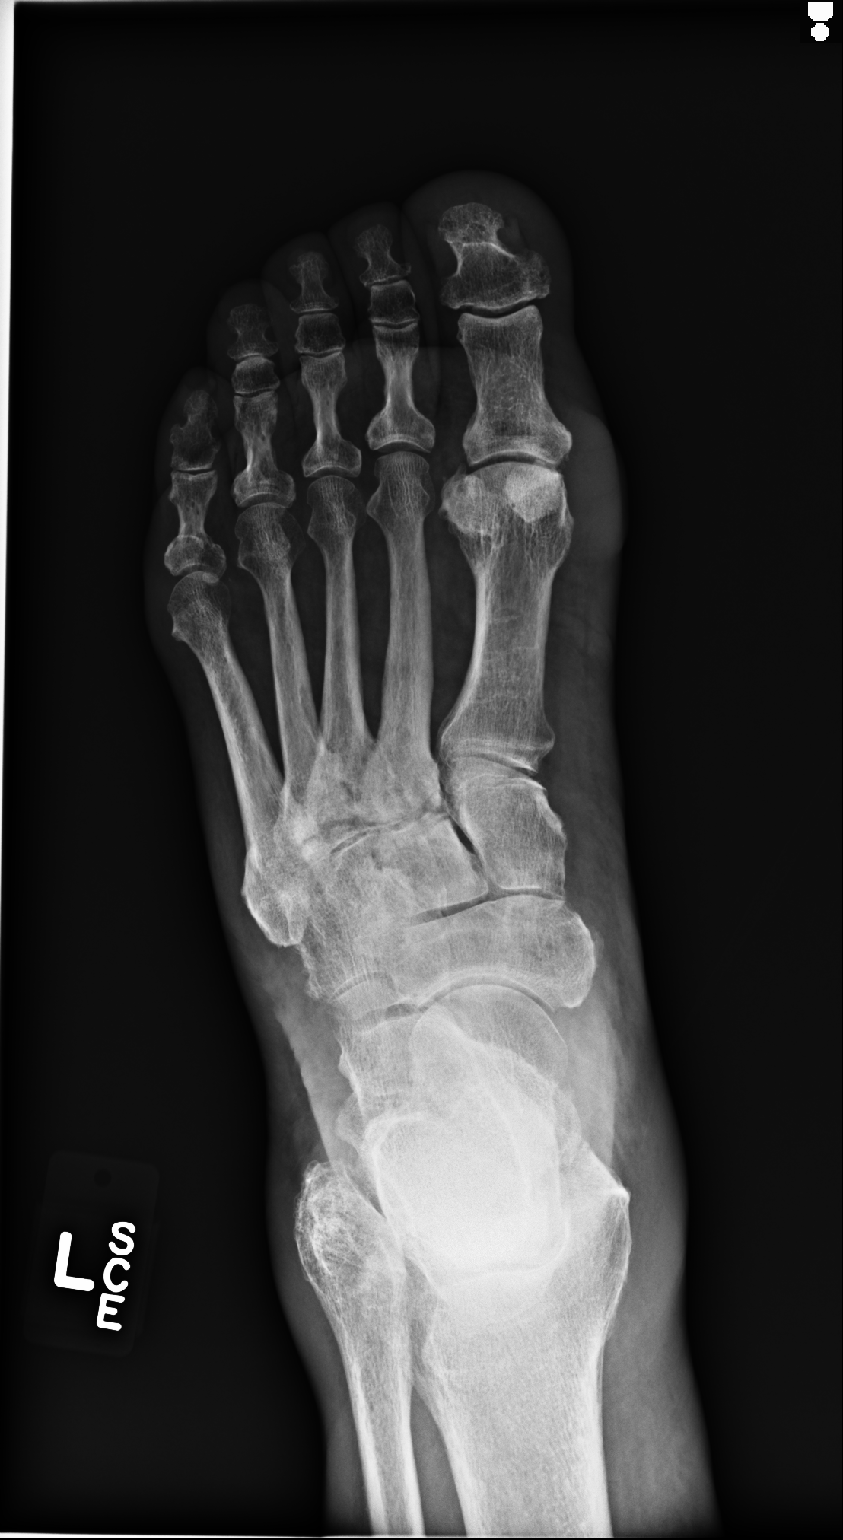
[im 2/3]
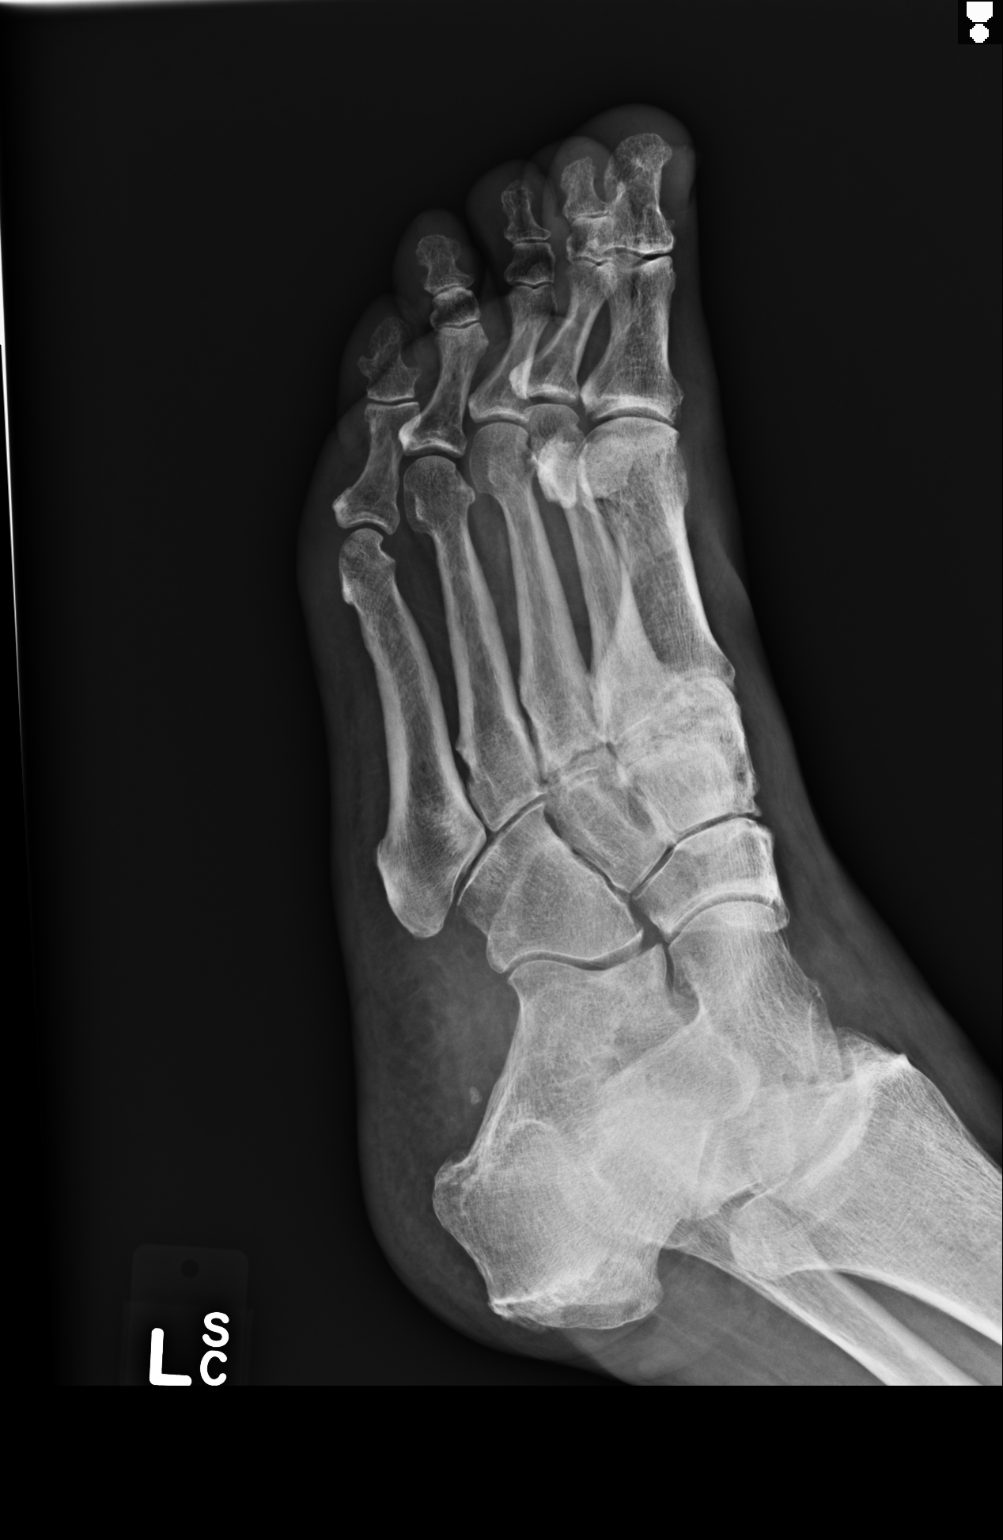
[im 3/3]
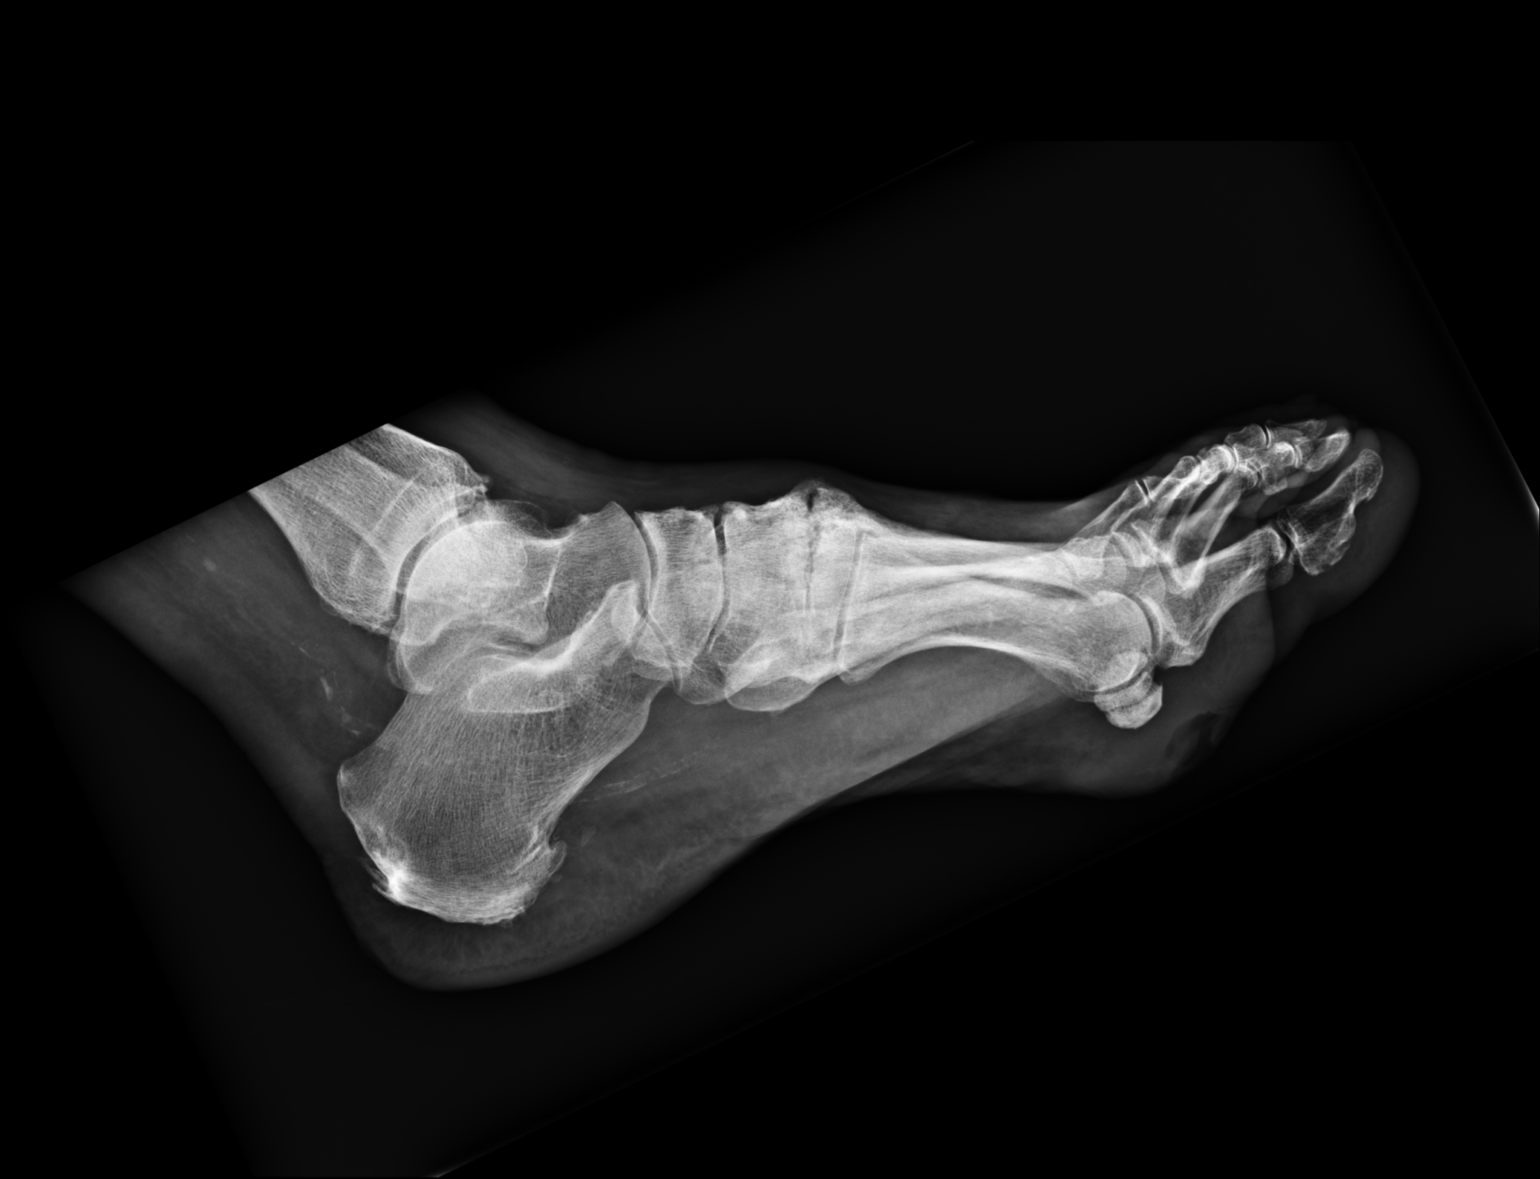

[3 of 3 positions shown; findings below may reference images not displayed]

FINDINGS: Skin defect at the plantar aspect of the first MTP joint without
focal cortical loss to suggest osteomyelitis. No radiodense foreign
body. Degenerative sclerosis and dorsal spurring at the second and
third tarsometatarsal joints. Otherwise normal alignment and
mineralization. Negative for fracture or dislocation. Calcaneal spur
at the plantar aponeurosis. Patchy arterial calcifications.
IMPRESSION: 1. Plantar soft tissue defect without radiographic evidence of
osteomyelitis or foreign body.

## 2016-03-06 DIAGNOSIS — R7302 Impaired glucose tolerance (oral): Secondary | ICD-10-CM | POA: Diagnosis not present

## 2016-03-31 DIAGNOSIS — E113593 Type 2 diabetes mellitus with proliferative diabetic retinopathy without macular edema, bilateral: Secondary | ICD-10-CM | POA: Diagnosis not present

## 2016-05-05 DIAGNOSIS — E113511 Type 2 diabetes mellitus with proliferative diabetic retinopathy with macular edema, right eye: Secondary | ICD-10-CM | POA: Diagnosis not present

## 2016-05-19 DIAGNOSIS — R399 Unspecified symptoms and signs involving the genitourinary system: Secondary | ICD-10-CM | POA: Diagnosis not present

## 2016-05-24 DIAGNOSIS — E785 Hyperlipidemia, unspecified: Secondary | ICD-10-CM | POA: Diagnosis not present

## 2016-05-24 DIAGNOSIS — Z0000 Encounter for general adult medical examination without abnormal findings: Secondary | ICD-10-CM | POA: Diagnosis not present

## 2016-05-24 DIAGNOSIS — Z125 Encounter for screening for malignant neoplasm of prostate: Secondary | ICD-10-CM | POA: Diagnosis not present

## 2016-05-24 DIAGNOSIS — E119 Type 2 diabetes mellitus without complications: Secondary | ICD-10-CM | POA: Diagnosis not present

## 2016-05-24 DIAGNOSIS — Z23 Encounter for immunization: Secondary | ICD-10-CM | POA: Diagnosis not present

## 2016-05-24 DIAGNOSIS — I10 Essential (primary) hypertension: Secondary | ICD-10-CM | POA: Diagnosis not present

## 2016-06-28 DIAGNOSIS — E119 Type 2 diabetes mellitus without complications: Secondary | ICD-10-CM | POA: Diagnosis not present

## 2016-06-28 DIAGNOSIS — Z125 Encounter for screening for malignant neoplasm of prostate: Secondary | ICD-10-CM | POA: Diagnosis not present

## 2016-06-28 DIAGNOSIS — I10 Essential (primary) hypertension: Secondary | ICD-10-CM | POA: Diagnosis not present

## 2016-06-28 DIAGNOSIS — E785 Hyperlipidemia, unspecified: Secondary | ICD-10-CM | POA: Diagnosis not present

## 2016-08-09 DIAGNOSIS — E113511 Type 2 diabetes mellitus with proliferative diabetic retinopathy with macular edema, right eye: Secondary | ICD-10-CM | POA: Diagnosis not present

## 2016-11-07 DIAGNOSIS — E113511 Type 2 diabetes mellitus with proliferative diabetic retinopathy with macular edema, right eye: Secondary | ICD-10-CM | POA: Diagnosis not present

## 2016-12-08 DIAGNOSIS — I10 Essential (primary) hypertension: Secondary | ICD-10-CM | POA: Diagnosis not present

## 2016-12-08 DIAGNOSIS — E785 Hyperlipidemia, unspecified: Secondary | ICD-10-CM | POA: Diagnosis not present

## 2016-12-08 DIAGNOSIS — E119 Type 2 diabetes mellitus without complications: Secondary | ICD-10-CM | POA: Diagnosis not present

## 2017-03-14 DIAGNOSIS — E113511 Type 2 diabetes mellitus with proliferative diabetic retinopathy with macular edema, right eye: Secondary | ICD-10-CM | POA: Diagnosis not present

## 2017-08-08 DIAGNOSIS — Z0000 Encounter for general adult medical examination without abnormal findings: Secondary | ICD-10-CM | POA: Diagnosis not present

## 2017-08-08 DIAGNOSIS — I10 Essential (primary) hypertension: Secondary | ICD-10-CM | POA: Diagnosis not present

## 2017-08-08 DIAGNOSIS — E785 Hyperlipidemia, unspecified: Secondary | ICD-10-CM | POA: Diagnosis not present

## 2017-08-08 DIAGNOSIS — Z125 Encounter for screening for malignant neoplasm of prostate: Secondary | ICD-10-CM | POA: Diagnosis not present

## 2017-08-08 DIAGNOSIS — E119 Type 2 diabetes mellitus without complications: Secondary | ICD-10-CM | POA: Diagnosis not present

## 2017-08-13 DIAGNOSIS — E113511 Type 2 diabetes mellitus with proliferative diabetic retinopathy with macular edema, right eye: Secondary | ICD-10-CM | POA: Diagnosis not present

## 2017-10-26 DIAGNOSIS — J209 Acute bronchitis, unspecified: Secondary | ICD-10-CM | POA: Diagnosis not present

## 2017-10-26 DIAGNOSIS — J019 Acute sinusitis, unspecified: Secondary | ICD-10-CM | POA: Diagnosis not present

## 2017-10-26 DIAGNOSIS — B9689 Other specified bacterial agents as the cause of diseases classified elsewhere: Secondary | ICD-10-CM | POA: Diagnosis not present

## 2018-08-14 ENCOUNTER — Encounter: Payer: Self-pay | Admitting: Family Medicine

## 2018-08-14 ENCOUNTER — Ambulatory Visit (INDEPENDENT_AMBULATORY_CARE_PROVIDER_SITE_OTHER): Payer: PPO | Admitting: Family Medicine

## 2018-08-14 ENCOUNTER — Other Ambulatory Visit: Payer: Self-pay | Admitting: Family Medicine

## 2018-08-14 VITALS — BP 138/78 | HR 73 | Temp 98.4°F | Resp 16 | Ht 68.0 in | Wt 229.4 lb

## 2018-08-14 DIAGNOSIS — H9193 Unspecified hearing loss, bilateral: Secondary | ICD-10-CM | POA: Diagnosis not present

## 2018-08-14 DIAGNOSIS — E1136 Type 2 diabetes mellitus with diabetic cataract: Secondary | ICD-10-CM | POA: Diagnosis not present

## 2018-08-14 DIAGNOSIS — Z0000 Encounter for general adult medical examination without abnormal findings: Secondary | ICD-10-CM

## 2018-08-14 DIAGNOSIS — R600 Localized edema: Secondary | ICD-10-CM | POA: Diagnosis not present

## 2018-08-14 DIAGNOSIS — I10 Essential (primary) hypertension: Secondary | ICD-10-CM

## 2018-08-14 DIAGNOSIS — E785 Hyperlipidemia, unspecified: Secondary | ICD-10-CM

## 2018-08-14 DIAGNOSIS — E1169 Type 2 diabetes mellitus with other specified complication: Secondary | ICD-10-CM | POA: Diagnosis not present

## 2018-08-14 DIAGNOSIS — E119 Type 2 diabetes mellitus without complications: Secondary | ICD-10-CM | POA: Insufficient documentation

## 2018-08-14 DIAGNOSIS — Z7689 Persons encountering health services in other specified circumstances: Secondary | ICD-10-CM | POA: Diagnosis not present

## 2018-08-14 DIAGNOSIS — R351 Nocturia: Secondary | ICD-10-CM

## 2018-08-14 NOTE — Progress Notes (Signed)
Subjective:    Patient ID: Aaron Sosa, male    DOB: [birth year], 77 y.o.   MRN: [id number]  [name] is a 77 y.o. male presenting on 08/14/2018 for Hypertension and Diabetes  Previous PCP Dr Dorothey Baseman at [facility], now here to establish care. Last visit 1 year ago for yearly w/ labs.  HPI  CHRONIC DM, Type 2: Reports previously treated by PCP, his last A1c was up to 6.9, in 07/2017. They wanted him to keep taking Metformin but he has had GI intolerance and stopped this for while now months. He has tried Glipizide in past, had chest pains and stopped this. CBGs: he does not check regularly Meds: None currently (off Metformin, Glipizide) Currently on ACEi Lifestyle: - Diet (Limited diet with reduced starch and carb, sugar)  - Exercise (walking regularly 6-7k daily at home, follows a walking path) - History of s/p cataract surgery in eyes, previously followed by [facility] Dr Willey Blade, and then he left their office. Denies hypoglycemia, polyuria, visual changes, numbness or tingling.  CHRONIC HTN: Reports recently some elevated BP at times, admits to some anxiety w/ doctors visit.  Current Meds - Lisinopril 2.5mg    Reports good compliance, took meds today. Tolerating well, w/o complaints. Denies CP, dyspnea, HA, edema, dizziness / lightheadedness  HYPERLIPIDEMIA: - Reports prior concerns of taking Atorvastatin 20mg  in past. Last lipid panel 11/2016, mild elevated LDL >120, HDL 50, and TG 153 - OFF Atorvastatin 20mg  due to myalgias >6-12 months or longer  Muscle Cramping Taking Magnesium supplement OTC  Chronic Hearing Loss, Worse L>R Reports chronic history of gradual hearing loss bilateral, he used to drive truck and attributed it to that. He describes several years ago having this problem, and saw [facility], and has one hearing aid in Left ear, with some relief. Overall still difficulty with hearing. No clear cause of hearing loss by his report.  Health  Maintenance: Due for Flu Shot, declines today despite counseling on benefits - he has concerns of side effects on Flu  Due for initial pneumonia vaccine at age 36 - he has not had it before, but declines still.  Depression screen PHQ 2/9 08/14/2018  Decreased Interest 0  Down, Depressed, Hopeless 0  PHQ - 2 Score 0    Past Medical History:  Diagnosis Date  . Arthritis   . HOH (hard of hearing)   . Lower extremity edema    Past Surgical History:  Procedure Laterality Date  . CATARACT EXTRACTION Left   . CATARACT EXTRACTION W/PHACO Right 05/06/2015   Procedure: CATARACT EXTRACTION PHACO AND INTRAOCULAR LENS PLACEMENT (IOC);  Surgeon: Lia Hopping, MD;  Location: [facility] ORS;  Service: Ophthalmology;  Laterality: Right;  [place] 1:14.7         AP   11.2          CDE   8.43    cassette lot #[id number]  . RETINAL DETACHMENT SURGERY    . WRIST FRACTURE SURGERY  1958   Social History   Socioeconomic History  . Marital status: Widowed    Spouse name: Not on file  . Number of children: Not on file  . Years of education: [employer]  . Highest education level: High school graduate  Occupational History  . Not on file  Social Needs  . Financial resource strain: Not on file  . Food insecurity:    Worry: Not on file    Inability: Not on file  . Transportation needs:  Medical: Not on file    Non-medical: Not on file  Tobacco Use  . Smoking status: Former Smoker    Packs/day: 2.00    Years: 5.00    Pack years: 10.00    Types: Cigarettes  . Smokeless tobacco: Former Engineer, water and Sexual Activity  . Alcohol use: Yes    Alcohol/week: 1.0 standard drinks    Types: 1 Cans of beer per week  . Drug use: Never  . Sexual activity: Not on file  Lifestyle  . Physical activity:    Days per week: Not on file    Minutes per session: Not on file  . Stress: Not on file  Relationships  . Social connections:    Talks on phone: Not on file    Gets together: Not on file    Attends  religious service: Not on file    Active member of club or organization: Not on file    Attends meetings of clubs or organizations: Not on file    Relationship status: Not on file  . Intimate partner violence:    Fear of current or ex partner: Not on file    Emotionally abused: Not on file    Physically abused: Not on file    Forced sexual activity: Not on file  Other Topics Concern  . Not on file  Social History Narrative  . Not on file   History reviewed. No pertinent family history. Current Outpatient Medications on File Prior to Visit  Medication Sig  . acetaminophen (TYLENOL) 325 MG tablet Take by mouth.  Marland Kitchen lisinopril (PRINIVIL,ZESTRIL) 2.5 MG tablet Take 2.5 mg by mouth daily.  . magnesium 30 MG tablet Take 30 mg by mouth once.  Marland Kitchen ibuprofen (ADVIL,MOTRIN) 200 MG tablet Take 200 mg by mouth every 6 (six) hours as needed.  . Multiple Vitamin (MULTIVITAMIN) capsule Take 1 capsule by mouth daily.   No current facility-administered medications on file prior to visit.     Review of Systems Per HPI unless specifically indicated above     Objective:    BP 138/78 (BP Location: Left Arm, Cuff Size: Normal)   Pulse 73   Temp 98.4 F (36.9 C) (Oral)   Resp 16   Ht 5\' 8"  (1.727 m)   Wt 229 lb 6.4 oz (104.1 kg)   BMI 34.88 kg/m   Wt Readings from Last 3 Encounters:  08/14/18 229 lb 6.4 oz (104.1 kg)  05/06/15 213 lb (96.6 kg)    Physical Exam  Constitutional: He is oriented to person, place, and time. He appears well-developed and well-nourished. No distress.  Well-appearing, comfortable, cooperative  HENT:  Head: Normocephalic and atraumatic.  Mouth/Throat: Oropharynx is clear and moist.  Hard of hearing conversationally. Has hearing aid in L ear  Eyes: Conjunctivae are normal. Right eye exhibits no discharge. Left eye exhibits no discharge.  Cardiovascular: Normal rate.  Pulmonary/Chest: Effort normal.  Musculoskeletal: He exhibits edema (R>L lower extremity edema,  pitting +2, non tender).  Neurological: He is alert and oriented to person, place, and time.  Skin: Skin is warm and dry. No rash noted. He is not diaphoretic. No erythema.  Psychiatric: He has a normal mood and affect. His behavior is normal.  Well groomed, good eye contact, normal speech and thoughts  Nursing note and vitals reviewed.    Diabetic Foot Exam - Simple   Simple Foot Form Diabetic Foot exam was performed with the following findings:  Yes 08/14/2018  3:39 PM  Visual  Inspection See comments:  Yes Sensation Testing Intact to touch and monofilament testing bilaterally:  Yes Pulse Check Posterior Tibialis and Dorsalis pulse intact bilaterally:  Yes Comments Bilateral toenails with increased thickness without deformity. Mild callus formation bilateral heels. No ulceration. Intact monofilament. Some mild edema bilateral lower extremity and feet R>L     Results for orders placed or performed during the hospital encounter of 05/06/15  Glucose, capillary  Result Value Ref Range   Glucose-Capillary 131 (H) 65 - 99 mg/dL      Assessment & Plan:   Problem List Items Addressed This Visit    Essential hypertension    Elevated initial BP, repeat manual check improved to nearly goal range. - Home BP readings none currently - can check though  No known complications    Plan:  1. Continue current BP regimen - Lisinopril 2.5mg  daily - discuss may adjust dose in future if indicated based on home readings 2. Encourage improved lifestyle - low sodium diet, regular exercise 3. Start monitor BP outside office, bring readings to next visit, if persistently >140/90 or new symptoms notify office sooner 4. Follow-up 4-6 weeks labs yearly       Hearing loss of both ears    Chronic stable problem Affecting his function, but somewhat improved w/ hearing aid No interested in further treatment, he has seen [facility] in past      Hyperlipidemia associated with type 2 diabetes mellitus  (HCC)    Due for fasting lipid panel, previous statin intolerance w/ myalgia Last lipid panel >1 year ago Calculated ASCVD 10 yr risk score elevated as Diabetic  Plan: 1. Remain off statin currently for now - check fasting lipid 4 weeks - discuss switch to Rosuvastatin or other option vs lower dose 2. 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 Currently remain off medication - check A1c with upcoming labs - discussed alternative meds since he declines Metformin - consider GLP1 agent most likely vs DPP4 - will have him call insurance to find out cost/coverage and pick med at next visit after A1c 2. Encourage improved lifestyle - low carb, low sugar diet, reduce portion size, continue improving regular exercise 3. Check CBG, bring log to next visit for review 4. Continue ACEi - discuss future restart Statin - check lipids first 5. DM Foot exam done today / Advised to schedule DM ophtho exam, send record - handout given, he self discharged from [facility] 6. Follow-up 4-6 weeks        Other Visit Diagnoses    Encounter to establish care with new doctor          No orders of the defined types were placed in this encounter.   Follow up plan: Return in about 4 weeks (around 09/11/2018) for 4-6 weeks for Annual  Physical.  Future labs ordered for 09/17/18  Saralyn Pilar, DO [place]  [name] 08/14/2018, 4:06 PM

## 2018-08-14 NOTE — Assessment & Plan Note (Addendum)
Previously controlled T2DM w/ A1c in 6-7 range, last 1 year ago No known hypoglycemia or hyperglycemia Complications - cataracts bilateral (s/p surgery), other including hyperlipidemia specifically obesity - increases risk of future cardiovascular complications  Failed Metformin (GI intolerance), Glipizide (side effect)  Plan:  1. Currently remain off medication - check A1c with upcoming labs - discussed alternative meds since he declines Metformin - consider GLP1 agent most likely vs DPP4 - will have him call insurance to find out cost/coverage and pick med at next visit after A1c 2. Encourage improved lifestyle - low carb, low sugar diet, reduce portion size, continue improving regular exercise 3. Check CBG, bring log to next visit for review 4. Continue ACEi - discuss future restart Statin - check lipids first 5. DM Foot exam done today / Advised to schedule DM ophtho exam, send record - handout given, he self discharged from [name] 6. Follow-up 4-6 weeks

## 2018-08-14 NOTE — Assessment & Plan Note (Signed)
Episodic R>L edema, uncertain exact etiology Follow-up upcoming labs and discussion may warrant further work-up with imaging, doppler ABI etc

## 2018-08-14 NOTE — Patient Instructions (Addendum)
Thank you for coming to the office today.  Call insurance find cost and coverage of the following  1. Ozempic (Semaglutide injection) - start 0.25mg  weekly for 4 weeks then increase to 0.5mg  weekly - This one has best benefit of weight loss and reducing Cardiovascular events  2. Bydureon BCise (Exenatide ER) - once weekly - this is my preference, very good medicine well tolerated, less side effects of nausea, upset stomach. No dose changes. Cost and coverage is the problem, but we may be able to get it with the coupon card  3. Trulicity (Dulaglutide) - once weekly - this is very good one, usually one of my top choices as well, two doses, 0.75 (likely we would start) and 1.5 max dose. We can use coupon card here too  4. Victoza (Liraglutide) - once DAILY - 3 dose changes 0.6, 1.2 and 1.8, side effects nausea, upset stomach higher on this one but it is still very effective medicine  ------------------------------------------  Januvia (Sitagliptin) ask about this medicine as well, this is oral pill instead of injectable.  ------------------------  Your provider would like to you have your annual eye exam. Please contact your current eye doctor or here are some good options for you to contact.   [name]   Address: [address] [place], [place] [address] Phone: [phone]  Website: [email or web address]  [facility]  Address: [address] [place], [place], [place] [address] Phone: [phone]   [name] [address] [place], [place] [address] Phone: [phone]  [facility] Address: [address] [place], [place], [place] [address]  Phone: [phone]  DUE for FASTING BLOOD WORK (no food or drink after midnight before the lab appointment, only water or coffee without cream/sugar on the morning of)  SCHEDULE "Lab Only" visit in the morning at the clinic for lab draw in 4 weeks  - Make sure Lab Only appointment is at about 1 week before your next  appointment, so that results will be available  For Lab Results, once available within 2-3 days of blood draw, you can can log in to MyChart online to view your results and a brief explanation. Also, we can discuss results at next follow-up visit.   Please schedule a Follow-up Appointment to: Return in about 4 weeks (around 09/11/2018) for 4-6 weeks for Annual Physical.  If you have any other questions or concerns, please feel free to call the office or send a message through MyChart. You may also schedule an earlier appointment if necessary.  Additionally, you may be receiving a survey about your experience at our office within a few days to 1 week by e-mail or mail. We value your feedback.  Saralyn Pilar, DO [facility], [place]

## 2018-08-14 NOTE — Assessment & Plan Note (Signed)
Elevated initial BP, repeat manual check improved to nearly goal range. - Home BP readings none currently - can check though  No known complications    Plan:  1. Continue current BP regimen - Lisinopril 2.5mg  daily - discuss may adjust dose in future if indicated based on home readings 2. Encourage improved lifestyle - low sodium diet, regular exercise 3. Start monitor BP outside office, bring readings to next visit, if persistently >140/90 or new symptoms notify office sooner 4. Follow-up 4-6 weeks labs yearly

## 2018-08-14 NOTE — Assessment & Plan Note (Signed)
Chronic stable problem Affecting his function, but somewhat improved w/ hearing aid No interested in further treatment, he has seen [facility] in past

## 2018-08-14 NOTE — Assessment & Plan Note (Signed)
Due for fasting lipid panel, previous statin intolerance w/ myalgia Last lipid panel >1 year ago Calculated ASCVD 10 yr risk score elevated as Diabetic  Plan: 1. Remain off statin currently for now - check fasting lipid 4 weeks - discuss switch to Rosuvastatin or other option vs lower dose 2. Encourage improved lifestyle - low carb/cholesterol, reduce portion size, continue improving regular exercise Follow-up 4-6 weeks annual labs

## 2018-09-17 ENCOUNTER — Other Ambulatory Visit: Payer: PPO

## 2018-09-17 DIAGNOSIS — Z0000 Encounter for general adult medical examination without abnormal findings: Secondary | ICD-10-CM

## 2018-09-17 DIAGNOSIS — E1169 Type 2 diabetes mellitus with other specified complication: Secondary | ICD-10-CM

## 2018-09-17 DIAGNOSIS — E785 Hyperlipidemia, unspecified: Secondary | ICD-10-CM | POA: Diagnosis not present

## 2018-09-17 DIAGNOSIS — I10 Essential (primary) hypertension: Secondary | ICD-10-CM | POA: Diagnosis not present

## 2018-09-17 DIAGNOSIS — R600 Localized edema: Secondary | ICD-10-CM

## 2018-09-17 DIAGNOSIS — R351 Nocturia: Secondary | ICD-10-CM

## 2018-09-17 DIAGNOSIS — E1136 Type 2 diabetes mellitus with diabetic cataract: Secondary | ICD-10-CM

## 2018-09-18 LAB — CBC WITH DIFFERENTIAL/PLATELET
BASOS PCT: 0.3 %
Basophils Absolute: 20 cells/uL (ref 0–200)
EOS ABS: 272 {cells}/uL (ref 15–500)
Eosinophils Relative: 4 %
HCT: 40.7 % (ref 38.5–50.0)
HEMOGLOBIN: 13.7 g/dL (ref 13.2–17.1)
Lymphs Abs: 1612 cells/uL (ref 850–3900)
MCH: 28.5 pg (ref 27.0–33.0)
MCHC: 33.7 g/dL (ref 32.0–36.0)
MCV: 84.6 fL (ref 80.0–100.0)
MONOS PCT: 6.8 %
MPV: 10.4 fL (ref 7.5–12.5)
NEUTROS ABS: 4434 {cells}/uL (ref 1500–7800)
Neutrophils Relative %: 65.2 %
PLATELETS: 234 10*3/uL (ref 140–400)
RBC: 4.81 10*6/uL (ref 4.20–5.80)
RDW: 12.4 % (ref 11.0–15.0)
TOTAL LYMPHOCYTE: 23.7 %
WBC: 6.8 10*3/uL (ref 3.8–10.8)
WBCMIX: 462 {cells}/uL (ref 200–950)

## 2018-09-18 LAB — LIPID PANEL
CHOL/HDL RATIO: 4.3 (calc) (ref <5.0)
Cholesterol: 212 mg/dL — ABNORMAL HIGH (ref <200)
HDL: 49 mg/dL (ref >40)
LDL CHOLESTEROL (CALC): 143 mg/dL — AB
NON-HDL CHOLESTEROL (CALC): 163 mg/dL — AB (ref <130)
Triglycerides: 93 mg/dL (ref <150)

## 2018-09-18 LAB — COMPLETE METABOLIC PANEL WITH GFR
AG Ratio: 1.5 (calc) (ref 1.0–2.5)
ALKALINE PHOSPHATASE (APISO): 82 U/L (ref 40–115)
ALT: 9 U/L (ref 9–46)
AST: 12 U/L (ref 10–35)
Albumin: 4 g/dL (ref 3.6–5.1)
BUN: 17 mg/dL (ref 7–25)
CALCIUM: 9.3 mg/dL (ref 8.6–10.3)
CO2: 27 mmol/L (ref 20–32)
CREATININE: 0.84 mg/dL (ref 0.70–1.18)
Chloride: 100 mmol/L (ref 98–110)
GFR, EST NON AFRICAN AMERICAN: 84 mL/min/{1.73_m2} (ref >=60)
GFR, Est African American: 98 mL/min/{1.73_m2} (ref >=60)
GLUCOSE: 244 mg/dL — AB (ref 65–99)
Globulin: 2.7 g/dL (calc) (ref 1.9–3.7)
Potassium: 5 mmol/L (ref 3.5–5.3)
Sodium: 136 mmol/L (ref 135–146)
Total Bilirubin: 0.6 mg/dL (ref 0.2–1.2)
Total Protein: 6.7 g/dL (ref 6.1–8.1)

## 2018-09-18 LAB — HEMOGLOBIN A1C
Hgb A1c MFr Bld: 12.2 % of total Hgb — ABNORMAL HIGH (ref <5.7)
MEAN PLASMA GLUCOSE: 303 (calc)
eAG (mmol/L): 16.8 (calc)

## 2018-09-18 LAB — PSA: PSA: 0.5 ng/mL (ref <=4.0)

## 2018-09-24 ENCOUNTER — Encounter: Payer: Self-pay | Admitting: Family Medicine

## 2018-09-24 ENCOUNTER — Ambulatory Visit (INDEPENDENT_AMBULATORY_CARE_PROVIDER_SITE_OTHER): Payer: PPO | Admitting: Family Medicine

## 2018-09-24 VITALS — BP 130/70 | HR 85 | Temp 98.5°F | Resp 16 | Ht 68.0 in | Wt 230.0 lb

## 2018-09-24 DIAGNOSIS — E785 Hyperlipidemia, unspecified: Secondary | ICD-10-CM | POA: Diagnosis not present

## 2018-09-24 DIAGNOSIS — E1136 Type 2 diabetes mellitus with diabetic cataract: Secondary | ICD-10-CM | POA: Diagnosis not present

## 2018-09-24 DIAGNOSIS — E1169 Type 2 diabetes mellitus with other specified complication: Secondary | ICD-10-CM | POA: Diagnosis not present

## 2018-09-24 DIAGNOSIS — I10 Essential (primary) hypertension: Secondary | ICD-10-CM | POA: Diagnosis not present

## 2018-09-24 DIAGNOSIS — Z0000 Encounter for general adult medical examination without abnormal findings: Secondary | ICD-10-CM

## 2018-09-24 MED ORDER — ROSUVASTATIN CALCIUM 10 MG PO TABS: 10.0000 mg | ORAL_TABLET | Freq: Every day | ORAL | 5 refills | Status: DC

## 2018-09-24 MED ORDER — SEMAGLUTIDE(0.25 OR 0.5MG/DOS) 2 MG/1.5ML ~~LOC~~ SOPN: 0.2500 mg | PEN_INJECTOR | SUBCUTANEOUS | 0 refills | Status: DC

## 2018-09-24 NOTE — Patient Instructions (Addendum)
Thank you for coming to the office today.  A1c 12.2, very elevated, uncontrolled diabetes.  I am concerned that we do not have any good options for diabetes. We can consider financial assistance - we will reach out to your insurance and see if we can find an option for Ozempic.  Stay tuned for phone call for financial assistance.  START Sample Ozempic 0.25mg  weekly for 4 weeks then increase to 0.5mg  on dial, use this weekly for remaining 2 weeks on sample, then we will hopefully have assistance for you.  Please schedule a Follow-up Appointment to: Return in about 3 months (around 12/25/2018) for DM A1c, med adjust.  If you have any other questions or concerns, please feel free to call the office or send a message through MyChart. You may also schedule an earlier appointment if necessary.  Additionally, you may be receiving a survey about your experience at our office within a few days to 1 week by e-mail or mail. We value your feedback.  Saralyn Pilar, DO [facility], [place]

## 2018-09-24 NOTE — Progress Notes (Signed)
Subjective:    Patient ID: Aaron Sosa, male    DOB: 13-Aug-1941, 77 y.o.   MRN: [id number]  Aaron Sosa is a 77 y.o. male presenting on 09/24/2018 for Annual Exam   HPI   Here for Annual Physical and Lab Review  CHRONIC DM, Type 2: - He checked into cost and coverage of various medications GLP1, SGLT2 since last visit and cost was too high, after health insurance coverage prices were $45 a month up to >$90 for 3 months Recent A1c was significantly elevated >12 - off medications CBGs: he does not check regularly Meds: None currently (off Metformin, Glipizide) Currently on ACEi Lifestyle: - Diet (Limited diet with reduced starch and carb, sugar)  - Exercise (walking regularly 6-7k daily at home, follows a walking path) - History of s/p cataract surgery in eyes, previously followed by [facility] Dr Willey Blade, and then he left their office. He cannot afford new eye doctor for annual exam  Denies hypoglycemia  Additional complaint Still has some loose stools, taking probiotic and anti diarrhea med, still has episodes every few weeks. He thinks may be related to metformin as well.  CHRONIC HTN: History of elevated BP Current Meds - Lisinopril 2.5mg    Reports good compliance, took meds today. Tolerating well, w/o complaints.  HYPERLIPIDEMIA: Last lab 08/2018, still elevated LDL. Previously on Atorvastatin 20mg , failed due to myalgia. 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 He describes several years ago having this problem, and saw [facility], and has one hearing aid in Left ear, with some relief. Overall still difficulty with hearing. No clear cause of hearing loss by his report.  Health Maintenance: Due for Flu Shot, declines today despite counseling on benefits - he has concerns of side effects on Flu  Due for initial pneumonia  vaccine at age 38 - he has not had it before, but declines still.  Depression screen [facility] 2/9 09/24/2018 08/14/2018  Decreased Interest 0 0  Down, Depressed, Hopeless 0 0  PHQ - 2 Score 0 0    Past Medical History:  Diagnosis Date  . Arthritis   . HOH (hard of hearing)   . Lower extremity edema    Past Surgical History:  Procedure Laterality Date  . CATARACT EXTRACTION Left   . CATARACT EXTRACTION W/PHACO Right 05/06/2015   Procedure: CATARACT EXTRACTION PHACO AND INTRAOCULAR LENS PLACEMENT (IOC);  Surgeon: Lia Hopping, MD;  Location: [facility] ORS;  Service: Ophthalmology;  Laterality: Right;  [place] 1:14.7         AP   11.2          CDE   8.43    cassette lot #[id number]  . RETINAL DETACHMENT SURGERY    . WRIST FRACTURE SURGERY  1958   Social History   Socioeconomic History  . Marital status: Widowed    Spouse name: Not on file  . Number of children: Not on file  . Years of education: [employer]  . Highest education level: High school graduate  Occupational History  . Not on file  Social Needs  . Financial resource strain: Not on file  . Food insecurity:    Worry: Not on file    Inability: Not on file  . Transportation needs:    Medical: Not on file    Non-medical: Not on file  Tobacco Use  . Smoking  status: Former Smoker    Packs/day: 2.00    Years: 5.00    Pack years: 10.00    Types: Cigarettes  . Smokeless tobacco: Former Engineer, water and Sexual Activity  . Alcohol use: Yes    Alcohol/week: 1.0 standard drinks    Types: 1 Cans of beer per week  . Drug use: Never  . Sexual activity: Not on file  Lifestyle  . Physical activity:    Days per week: Not on file    Minutes per session: Not on file  . Stress: Not on file  Relationships  . Social connections:    Talks on phone: Not on file    Gets together: Not on file    Attends religious service: Not on file    Active member of club or organization: Not on file    Attends meetings of clubs or organizations:  Not on file    Relationship status: Not on file  . Intimate partner violence:    Fear of current or ex partner: Not on file    Emotionally abused: Not on file    Physically abused: Not on file    Forced sexual activity: Not on file  Other Topics Concern  . Not on file  Social History Narrative  . Not on file   History reviewed. No pertinent family history. Current Outpatient Medications on File Prior to Visit  Medication Sig  . acetaminophen (TYLENOL) 325 MG tablet Take by mouth.  Marland Kitchen ibuprofen (ADVIL,MOTRIN) 200 MG tablet Take 200 mg by mouth every 6 (six) hours as needed.  Marland Kitchen lisinopril (PRINIVIL,ZESTRIL) 2.5 MG tablet Take 2.5 mg by mouth daily.  . magnesium 30 MG tablet Take 30 mg by mouth once.  . Multiple Vitamin (MULTIVITAMIN) capsule Take 1 capsule by mouth daily.   No current facility-administered medications on file prior to visit.     Review of Systems  Constitutional: Negative for activity change, appetite change, chills, diaphoresis, fatigue and fever.  HENT: Positive for hearing loss. Negative for congestion.   Eyes: Negative for visual disturbance.  Respiratory: Negative for apnea, cough, chest tightness, shortness of breath and wheezing.   Cardiovascular: Negative for chest pain, palpitations and leg swelling.  Gastrointestinal: Negative for abdominal pain, anal bleeding, blood in stool, constipation, diarrhea, nausea and vomiting.  Endocrine: Negative for cold intolerance.  Genitourinary: Negative for decreased urine volume, dysuria, frequency, hematuria and urgency.  Musculoskeletal: Negative for arthralgias, back pain and neck pain.  Skin: Negative for rash.  Allergic/Immunologic: Negative for environmental allergies.  Neurological: Negative for dizziness, weakness, light-headedness, numbness and headaches.  Hematological: Negative for adenopathy.  Psychiatric/Behavioral: Negative for behavioral problems, dysphoric mood and sleep disturbance. The patient is not  nervous/anxious.    Per HPI unless specifically indicated above     Objective:    BP 130/70 (BP Location: Left Arm, Cuff Size: Normal)   Pulse 85   Temp 98.5 F (36.9 C) (Oral)   Resp 16   Ht 5\' 8"  (1.727 m)   Wt 230 lb (104.3 kg)   BMI 34.97 kg/m   Wt Readings from Last 3 Encounters:  09/24/18 230 lb (104.3 kg)  08/14/18 229 lb 6.4 oz (104.1 kg)  05/06/15 213 lb (96.6 kg)    Physical Exam  Constitutional: He is oriented to person, place, and time. He appears well-developed and well-nourished. No distress.  Well-appearing, comfortable, cooperative  HENT:  Head: Normocephalic and atraumatic.  Mouth/Throat: Oropharynx is clear and moist.  Hard of hearing  Eyes: Pupils are equal, round, and reactive to light. Conjunctivae and EOM are normal. Right eye exhibits no discharge. Left eye exhibits no discharge.  Neck: Normal range of motion. Neck supple. No thyromegaly present.  Cardiovascular: Normal rate, regular rhythm, normal heart sounds and intact distal pulses.  No murmur heard. Pulmonary/Chest: Effort normal and breath sounds normal. No respiratory distress. He has no wheezes. He has no rales.  Abdominal: Soft. Bowel sounds are normal. He exhibits no distension and no mass. There is no tenderness.  Musculoskeletal: Normal range of motion. He exhibits no edema or tenderness.  Upper / Lower Extremities: - Normal muscle tone, strength bilateral upper extremities 5/5, lower extremities 5/5  Lymphadenopathy:    He has no cervical adenopathy.  Neurological: He is alert and oriented to person, place, and time.  Distal sensation intact to light touch all extremities  Skin: Skin is warm and dry. No rash noted. He is not diaphoretic. No erythema.  Psychiatric: He has a normal mood and affect. His behavior is normal.  Well groomed, good eye contact, normal speech and thoughts  Nursing note and vitals reviewed.  Results for orders placed or performed in visit on 09/17/18  PSA    Result Value Ref Range   PSA 0.5 < OR = 4.0 ng/mL  Lipid panel  Result Value Ref Range   Cholesterol 212 (H) <200 mg/dL   HDL 49 >16 mg/dL   Triglycerides 93 <109 mg/dL   LDL Cholesterol (Calc) 143 (H) mg/dL (calc)   Total CHOL/HDL Ratio 4.3 <5.0 (calc)   Non-HDL Cholesterol (Calc) 163 (H) <130 mg/dL (calc)  COMPLETE METABOLIC PANEL WITH GFR  Result Value Ref Range   Glucose, Bld 244 (H) 65 - 99 mg/dL   BUN 17 7 - 25 mg/dL   Creat 6.04 5.40 - 9.81 mg/dL   GFR, Est Non African American 84 > OR = 60 mL/min/1.54m2   GFR, Est African American 98 > OR = 60 mL/min/1.49m2   BUN/Creatinine Ratio NOT APPLICABLE 6 - 22 (calc)   Sodium 136 135 - 146 mmol/L   Potassium 5.0 3.5 - 5.3 mmol/L   Chloride 100 98 - 110 mmol/L   CO2 27 20 - 32 mmol/L   Calcium 9.3 8.6 - 10.3 mg/dL   Total Protein 6.7 6.1 - 8.1 g/dL   Albumin 4.0 3.6 - 5.1 g/dL   Globulin 2.7 1.9 - 3.7 g/dL (calc)   AG Ratio 1.5 1.0 - 2.5 (calc)   Total Bilirubin 0.6 0.2 - 1.2 mg/dL   Alkaline phosphatase (APISO) 82 40 - 115 U/L   AST 12 10 - 35 U/L   ALT 9 9 - 46 U/L  CBC with Differential/Platelet  Result Value Ref Range   WBC 6.8 3.8 - 10.8 Thousand/uL   RBC 4.81 4.20 - 5.80 Million/uL   Hemoglobin 13.7 13.2 - 17.1 g/dL   HCT 19.1 47.8 - 29.5 %   MCV 84.6 80.0 - 100.0 fL   MCH 28.5 27.0 - 33.0 pg   MCHC 33.7 32.0 - 36.0 g/dL   RDW 62.1 30.8 - 65.7 %   Platelets 234 140 - 400 Thousand/uL   MPV 10.4 7.5 - 12.5 fL   Neutro Abs 4,434 1,500 - 7,800 cells/uL   Lymphs Abs 1,612 850 - 3,900 cells/uL   WBC mixed population 462 200 - 950 cells/uL   Eosinophils Absolute 272 15 - 500 cells/uL   Basophils Absolute 20 0 - 200 cells/uL   Neutrophils Relative % 65.2 %  Total Lymphocyte 23.7 %   Monocytes Relative 6.8 %   Eosinophils Relative 4.0 %   Basophils Relative 0.3 %  Hemoglobin A1c  Result Value Ref Range   Hgb A1c MFr Bld 12.2 (H) <5.7 % of total Hgb   Mean Plasma Glucose 303 (calc)   eAG (mmol/L) 16.8 (calc)       Assessment & Plan:    Problem List Items Addressed This Visit    Essential hypertension  Controlled HTN No complication Continue lisinopril low dose and monitor BP    Relevant Medications   rosuvastatin (CRESTOR) 10 MG tablet   Hyperlipidemia associated with type 2 diabetes mellitus (HCC)  Elevated LDL, uncontrolled, as diabetic and elevated ASCVD Failed Atorvastatin in past  Plan Start new statin Rosuvastatin 10mg  nightly - instructions for taper dose in future to half vs intermittent if needed Follow up   Relevant Medications   rosuvastatin (CRESTOR) 10 MG tablet   Semaglutide,0.25 or 0.5MG /DOS, (OZEMPIC, 0.25 OR 0.5 MG/DOSE,) 2 MG/1.5ML SOPN   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 Hyperglycemia. - off medications No hypoglycemia Complications - cataracts bilateral (s/p surgery), other including hyperlipidemia specifically obesity - increases risk of future cardiovascular complications  Failed Metformin (GI intolerance), Glipizide (side effect)  Plan:  1. Very limited options due to med side effects and cost, despite having coverage HTA ins with some tier 3 copay on Ozepmic $45 monthly - he is financially unable to afford - limited options, he had history of side effect on Actos, cannot tolerate metformin, Sulfonylurea (not ideal for age and level of A1c), SGLT2 cost prohibitive as well. Declines insulin therapy - never on before - He was given sample ozempic GLP1 new start 0.25mg  weekly x 4 weeks then up to 0.5mg  weekly x 2 week, 1 pen = 6 weeks  **After visit spoke with [employer] rep [place] regarding financial assistance and samples, and determined patient was good candidate for samples for now to trial Ozempic, despite coverage w/ [employer] having tier 3 and cost $45 per month, also she states in 10/2018 there should be expanded coverage for financial assistance and she would help patient apply at that  time.  Encourage improved lifestyle - low carb, low sugar diet, reduce portion size, continue improving regular exercise Continue ACEi, RESTART Statin Unable to schedule DM Eye due to cost/coverage, advised importance - he will reconsider Follow-up 3 month DM      Relevant Medications   rosuvastatin (CRESTOR) 10 MG tablet   Semaglutide,0.25 or 0.5MG /DOS, (OZEMPIC, 0.25 OR 0.5 MG/DOSE,) 2 MG/1.5ML SOPN    Other Visit Diagnoses    Annual physical exam    -  Primary     Updated Health Maintenance information Reviewed recent lab results with patient Encouraged improvement to lifestyle with diet and exercise - Goal of weight loss   Meds ordered this encounter  Medications  . rosuvastatin (CRESTOR) 10 MG tablet    Sig: Take 1 tablet (10 mg total) by mouth daily.    Dispense:  30 tablet    Refill:  5  . Semaglutide,0.25 or 0.5MG /DOS, (OZEMPIC, 0.25 OR 0.5 MG/DOSE,) 2 MG/1.5ML SOPN    Sig: Inject 0.25 mg into the skin once a week. For first 4 weeks. Then increase dose to 0.5mg  weekly    Dispense:  1 pen    Refill:  0    Follow up plan: Return in about 3 months (around 12/25/2018) for DM A1c, med  adjust.  Saralyn Pilar, DO [facility] 09/24/2018, 2:29 PM

## 2018-09-25 NOTE — Assessment & Plan Note (Signed)
Dramatically elevated A1c >12 now uncontrolled. Hyperglycemia. - off medications No hypoglycemia Complications - cataracts bilateral (s/p surgery), other including hyperlipidemia specifically obesity - increases risk of future cardiovascular complications  Failed Metformin (GI intolerance), Glipizide (side effect)  Plan:  1. Very limited options due to med side effects and cost, despite having coverage HTA ins with some tier 3 copay on Ozepmic $45 monthly - he is financially unable to afford - limited options, he had history of side effect on Actos, cannot tolerate metformin, Sulfonylurea (not ideal for age and level of A1c), SGLT2 cost prohibitive as well. Declines insulin therapy - never on before - He was given sample ozempic GLP1 new start 0.25mg  weekly x 4 weeks then up to 0.5mg  weekly x 2 week, 1 pen = 6 weeks  **After visit spoke with [employer] rep [place] regarding financial assistance and samples, and determined patient was good candidate for samples for now to trial Ozempic, despite coverage w/ [employer] having tier 3 and cost $45 per month, also she states in 10/2018 there should be expanded coverage for financial assistance and she would help patient apply at that time.  Encourage improved lifestyle - low carb, low sugar diet, reduce portion size, continue improving regular exercise Continue ACEi, RESTART Statin Unable to schedule DM Eye due to cost/coverage, advised importance - he will reconsider Follow-up 3 month DM

## 2018-09-30 ENCOUNTER — Other Ambulatory Visit: Payer: Self-pay | Admitting: Family Medicine

## 2018-10-10 DIAGNOSIS — I10 Essential (primary) hypertension: Secondary | ICD-10-CM

## 2018-10-11 MED ORDER — LISINOPRIL 2.5 MG PO TABS: 2.5000 mg | ORAL_TABLET | Freq: Every day | ORAL | 1 refills | Status: DC

## 2018-12-26 ENCOUNTER — Telehealth: Payer: Self-pay | Admitting: Family Medicine

## 2018-12-26 DIAGNOSIS — I10 Essential (primary) hypertension: Secondary | ICD-10-CM

## 2018-12-26 MED ORDER — LISINOPRIL-HYDROCHLOROTHIAZIDE 20-12.5 MG PO TABS: 1.0000 | ORAL_TABLET | Freq: Every day | ORAL | 1 refills | Status: DC

## 2018-12-26 NOTE — Telephone Encounter (Signed)
Med refill - dose incorrect on his lisinopril, at start of care established 08/2018 he provided med list with lisinopril 2.5mg  daily, now he says actually was taking lisinopril-hctz 20-12.5mg  daily - he needs new rx now  Saralyn Pilar, DO [facility] 12/26/2018, 2:33 PM

## 2018-12-30 ENCOUNTER — Encounter: Payer: Self-pay | Admitting: Family Medicine

## 2018-12-30 ENCOUNTER — Ambulatory Visit (INDEPENDENT_AMBULATORY_CARE_PROVIDER_SITE_OTHER): Payer: PPO | Admitting: Family Medicine

## 2018-12-30 VITALS — BP 160/84 | HR 75 | Temp 98.3°F | Resp 16 | Ht 68.0 in | Wt 230.0 lb

## 2018-12-30 DIAGNOSIS — I10 Essential (primary) hypertension: Secondary | ICD-10-CM

## 2018-12-30 DIAGNOSIS — K591 Functional diarrhea: Secondary | ICD-10-CM

## 2018-12-30 DIAGNOSIS — E1136 Type 2 diabetes mellitus with diabetic cataract: Secondary | ICD-10-CM

## 2018-12-30 DIAGNOSIS — E669 Obesity, unspecified: Secondary | ICD-10-CM | POA: Diagnosis not present

## 2018-12-30 DIAGNOSIS — E66811 Obesity, class 1: Secondary | ICD-10-CM | POA: Insufficient documentation

## 2018-12-30 LAB — POCT GLYCOSYLATED HEMOGLOBIN (HGB A1C): Hemoglobin A1C: 11.8 % — AB (ref 4.0–5.6)

## 2018-12-30 NOTE — Assessment & Plan Note (Addendum)
Elevated BP still off medication, has not restarted correct Lisinopril-HCTZ med yet, he has at home - Home BP readings none currently No known complications     Plan:  1. Continue current BP regimen - Lisinopril-HCTZ 20-12.5mg  daily - note prior confusion of his med rec was for Lisinopril 2.5mg  daily only in past, and now recently we learned he actually was taking this med, and new rx was ordered recently 2. Encourage improved lifestyle - low sodium diet, regular exercise 3. Start monitor BP outside office, bring readings to next visit, if persistently >140/90 or new symptoms notify office sooner 4. Follow-up 3 months

## 2018-12-30 NOTE — Patient Instructions (Addendum)
Thank you for coming to the office today.  We will notify you when we have samples back in stock for 1 month.  Start Trulicity 0.75mg  injection ONCE A WEEK - call if there is a problem with the medication  CALL in 3 weeks if doing well, we will order your own prescription to pharmacy - you can pick it up and continue med.  Go ahead and start taking Lisinopril-HCTZ 20-12.5mg  once a day.  ------------------------------------------------  Try to limit Peppermint / Hard Candy and Coffee - these can cause some diarrhea.  May try fiber supplement - Metamucil powder daily.   ------------------  Your provider would like to you have your annual eye exam. Please contact your current eye doctor or here are some good options for you to contact.   [facility]   Address: [address] [place], [place] [address] Phone: [phone]  Website: [email or web address]   [facility] [address] [place], [place] [address] Phone: [phone] [email or web address]  [facility]  Address: [address] [place], [place], [place] [address] Phone: [phone]   [facility] [address] [place], [place] [address] Phone: [phone]  [facility] Address: [address] [place], [place], [place] [address]  Phone: [phone]   Please schedule a Follow-up Appointment to: Return in about 3 months (around 04/01/2019) for DM A1c.  If you have any other questions or concerns, please feel free to call the office or send a message through MyChart. You may also schedule an earlier appointment if necessary.  Additionally, you may be receiving a survey about your experience at our office within a few days to 1 week by e-mail or mail. We value your feedback.  Saralyn Pilar, DO [facility], [place]

## 2018-12-30 NOTE — Assessment & Plan Note (Signed)
Weight stable Improve diet, lifestyle GLP1 to help w/ wt

## 2018-12-30 NOTE — Assessment & Plan Note (Signed)
Slightly improved but still severely uncontrolled A1c 11.8 now - seems inability to use the Ozempic injection pen as ordered for him and some doses were ineffective question if accuracy of injection No hypoglycemia Complications - hyperglycemia, cataracts bilateral (s/p surgery), other including hyperlipidemia specifically obesity - increases risk of future cardiovascular complications  Failed Metformin (GI intolerance), Glipizide (side effect), Ozempic (unable to use)  Plan:  1. DC Ozempic - unable to use the pen, due to difficulty with device - SWITCH to New GLP1 agent - Trulicity 0.75mg  weekly injection - demo done in office and he feels more comfortable with this device. No samples available, new ones ordered, will call patient once samples arrive in office this week, notify patient to pick up - 1 month supply = 4 pens of 0.75mg  weekly  Encourage improved lifestyle - low carb, low sugar diet, reduce portion size, continue improving regular exercise  Continue ACEi, RESTART Statin  Unable to schedule DM Eye due to cost/coverage, advised importance - he will reconsider again and try to schedule  Follow-up 3 month DM A1c med adjust  Will order Trulicity rx to his pharmacy after 3 months if able to use pen and tolerating

## 2018-12-30 NOTE — Progress Notes (Signed)
Subjective:    Patient ID: Aaron Sosa, male    DOB: January 23, 1941, 78 y.o.   MRN: [id number]  Aaron Sosa is a 78 y.o. male presenting on 12/30/2018 for Diabetes   HPI   CHRONIC DM, Type 2 / Obesity BMI >34 Last visit 08/2018, he was started on GLP1 medication Ozempic, but unfortunately he had difficulty with the injection pen and ultimately he was not successful getting much or any medication, was ineffective when he did get the injection of medicine. Past A1c >12. Now due today. CBGs:he does not check regularly Meds:Ozempic 0.25mg  to 0.5mg  weekly - but non adherence due to difficulty with med injection (off Metformin, Glipizide) Currently on ACEi Lifestyle: - Diet (Still doing well with reduced starch and carb, sugar)  - Exercise (walking regularly 6-7k daily at home) - History of s/p cataract surgery in eyes, previously followed by [facility] Dr Willey Blade, and then he left their office. He cannot afford new eye doctor for annual exam  Denies hypoglycemia, polyuria, visual changes, numbness or tingling.  CHRONIC HTN: History of elevated BP in past. There was confusion on his HTN. Current Meds -CURRENTLY NOT TAKING - Lisinopril-HCTZ 20-12.5mg daily - recently ordered on 2/27 after notified we did not have correct BP med on file for him. Reports good compliance, took meds today. Tolerating well, w/o complaints. Denies CP, dyspnea, HA, edema, dizziness / lightheadedness  Diarrhea He had side effects from medicine, including metformin in past, has been off metformin due to diarrhea, then stopped this. And still has diarrhea episodes. He has tried imodium with constipation then. He tried pepto with improvement. - He does drink coffee and hard candy peppermint often    Depression screen [facility] 2/9 09/24/2018 08/14/2018  Decreased Interest 0 0  Down, Depressed, Hopeless 0 0  PHQ - 2 Score 0 0    Social History   Tobacco Use  . Smoking status: Former Smoker    Packs/day:  2.00    Years: 5.00    Pack years: 10.00    Types: Cigarettes  . Smokeless tobacco: Former Engineer, water Use Topics  . Alcohol use: Yes    Alcohol/week: 1.0 standard drinks    Types: 1 Cans of beer per week  . Drug use: Never    Review of Systems Per HPI unless specifically indicated above     Objective:    BP (!) 160/84 (BP Location: Left Arm, Cuff Size: Normal)   Pulse 75   Temp 98.3 F (36.8 C) (Oral)   Resp 16   Ht 5\' 8"  (1.727 m)   Wt 230 lb (104.3 kg)   BMI 34.97 kg/m   Wt Readings from Last 3 Encounters:  12/30/18 230 lb (104.3 kg)  09/24/18 230 lb (104.3 kg)  08/14/18 229 lb 6.4 oz (104.1 kg)    Physical Exam Vitals signs and nursing note reviewed.  Constitutional:      General: He is not in acute distress.    Appearance: He is well-developed. He is not diaphoretic.     Comments: Well-appearing, comfortable, cooperative  HENT:     Head: Normocephalic and atraumatic.     Comments: Hard of hearing Eyes:     General:        Right eye: No discharge.        Left eye: No discharge.     Conjunctiva/sclera: Conjunctivae normal.  Neck:     Musculoskeletal: Normal range of motion and neck supple.     Thyroid:  No thyromegaly.  Cardiovascular:     Rate and Rhythm: Normal rate and regular rhythm.     Heart sounds: Normal heart sounds. No murmur.  Pulmonary:     Effort: Pulmonary effort is normal. No respiratory distress.     Breath sounds: Normal breath sounds. No wheezing or rales.  Musculoskeletal: Normal range of motion.  Lymphadenopathy:     Cervical: No cervical adenopathy.  Skin:    General: Skin is warm and dry.     Findings: No erythema or rash.  Neurological:     Mental Status: He is alert and oriented to person, place, and time.  Psychiatric:        Behavior: Behavior normal.     Comments: Well groomed, good eye contact, normal speech and thoughts      Recent Labs    09/17/18 0834 12/30/18 0934  HGBA1C 12.2* 11.8*    Results for  orders placed or performed in visit on 12/30/18  POCT HgB A1C  Result Value Ref Range   Hemoglobin A1C 11.8 (A) 4.0 - 5.6 %      Assessment & Plan:   Problem List Items Addressed This Visit    Essential hypertension    Elevated BP still off medication, has not restarted correct Lisinopril-HCTZ med yet, he has at home - Home BP readings none currently No known complications     Plan:  1. Continue current BP regimen - Lisinopril-HCTZ 20-12.5mg  daily - note prior confusion of his med rec was for Lisinopril 2.5mg  daily only in past, and now recently we learned he actually was taking this med, and new rx was ordered recently 2. Encourage improved lifestyle - low sodium diet, regular exercise 3. Start monitor BP outside office, bring readings to next visit, if persistently >140/90 or new symptoms notify office sooner 4. 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 DC Ozempic - unable to use the pen, due to difficulty with device - SWITCH to New GLP1 agent - Trulicity 0.75mg  weekly injection - demo done in office and he feels more comfortable with this device. No samples available, new ones ordered, will call patient once samples arrive in office this week, notify patient to pick up - 1 month supply = 4 pens of 0.75mg  weekly  Encourage improved lifestyle - low carb, low sugar diet,  reduce portion size, continue improving regular exercise  Continue ACEi, RESTART Statin  Unable to schedule DM Eye due to cost/coverage, advised importance - he will reconsider again and try to schedule  Follow-up 3 month DM A1c med adjust  Will order Trulicity rx to his pharmacy after 3 months if able to use pen and tolerating      Relevant Orders   POCT HgB A1C (Completed)    Other Visit Diagnoses    Functional diarrhea       Suspected secondary to prior metformin, now may be coffee/caffeine and  hard candy use causing worse diarrhea, trial Imodium PRN      No orders of the defined types were placed in this encounter.   Follow up plan: Return in about 3 months (around 04/01/2019) for DM A1c.   Saralyn Pilar, DO [facility] 12/30/2018, 9:30 AM

## 2019-01-03 ENCOUNTER — Telehealth: Payer: Self-pay | Admitting: Family Medicine

## 2019-01-03 DIAGNOSIS — E1136 Type 2 diabetes mellitus with diabetic cataract: Secondary | ICD-10-CM

## 2019-01-03 MED ORDER — DULAGLUTIDE 0.75 MG/0.5ML ~~LOC~~ SOAJ: 0.7500 mg | SUBCUTANEOUS | 0 refills | Status: DC

## 2019-01-03 NOTE — Telephone Encounter (Signed)
Please notify patient that he may come by to pick up Trulicity samples - 0.75mg  dose - 2 boxes - each box has 2 pens, that equals 1 month supply total.  Also he should get a Trulicity Savings Card for copay to take to pharmacy. He should call us in about 3 weeks to continue with Trulicity rx that we can send to pharmacy.  Saralyn Pilar, DO [facility] 01/03/2019, 8:15 AM

## 2019-01-03 NOTE — Telephone Encounter (Signed)
Patient came up by office and picked it up.

## 2019-01-20 ENCOUNTER — Telehealth: Payer: Self-pay | Admitting: Family Medicine

## 2019-01-20 DIAGNOSIS — E1136 Type 2 diabetes mellitus with diabetic cataract: Secondary | ICD-10-CM

## 2019-01-20 MED ORDER — DULAGLUTIDE 0.75 MG/0.5ML ~~LOC~~ SOAJ: 0.7500 mg | SUBCUTANEOUS | 3 refills | Status: DC

## 2019-01-20 NOTE — Telephone Encounter (Signed)
Pt. called said that Trulicity was working for him wanted you to call a prescription in for medication

## 2019-01-20 NOTE — Telephone Encounter (Signed)
Sent rx Trulicity 0.75 to pharmacy.  Saralyn Pilar, DO [facility] 01/20/2019, 4:54 PM

## 2019-03-04 ENCOUNTER — Telehealth: Payer: Self-pay | Admitting: Family Medicine

## 2019-03-04 DIAGNOSIS — E1136 Type 2 diabetes mellitus with diabetic cataract: Secondary | ICD-10-CM

## 2019-03-04 NOTE — Telephone Encounter (Signed)
Thank you. I will follow for their note

## 2019-03-04 NOTE — Telephone Encounter (Signed)
Received fax from [facility], patient cannot afford Trulicity and had intolerance to metformin with GI upset.  Patient is very hard of hearing - but can you please call patient to notify him that one of our Clinical Pharmacist - Duanne Moron [facility] will call him soon to discuss his Diabetes medications and see if we can get the Trulicity covered for him.  Referral to Chronic Care Management regarding medication assistance financial unable to afford trulicity, failed metformin due to GI intolerance. Other barrier significant hearing loss affects his health literacy at times and may benefit from med rec and med management. Ultimately he did well on Trulicity samples, but may benefit from patient assistance to continue on this, unless an alternative cost effective med is available.   Saralyn Pilar, DO [facility] 03/04/2019, 5:21 PM

## 2019-03-05 ENCOUNTER — Telehealth: Payer: Self-pay

## 2019-03-05 ENCOUNTER — Ambulatory Visit: Payer: Self-pay | Admitting: Pharmacist

## 2019-03-05 DIAGNOSIS — E1136 Type 2 diabetes mellitus with diabetic cataract: Secondary | ICD-10-CM

## 2019-03-05 DIAGNOSIS — I10 Essential (primary) hypertension: Secondary | ICD-10-CM

## 2019-03-05 DIAGNOSIS — E1169 Type 2 diabetes mellitus with other specified complication: Secondary | ICD-10-CM

## 2019-03-05 DIAGNOSIS — E785 Hyperlipidemia, unspecified: Secondary | ICD-10-CM

## 2019-03-05 NOTE — Telephone Encounter (Signed)
Spoke to patient's DPR authorized person Hope Budds [phone]. Advised her that [facility] pharmacist from [place] will reach out to her for his medication management and financial assistant for medication to get approved or for cheaper rate. Patient returned my phone call states Trulicity is giving him muscle pain upper extremities but right side is worst and same complain for metformin.

## 2019-03-05 NOTE — Chronic Care Management (AMB) (Deleted)
  Chronic Care Management   Note  03/05/2019 Name: BREKYN VANGORDER MRN: [id number] DOB: Oct 25, 1941     Follow up plan: {CCM FOLLOW UP TXMI:[id number]}  SIGNATURE

## 2019-03-05 NOTE — Chronic Care Management (AMB) (Signed)
  Care Management Note   Aaron Sosa is a 78 y.o. year old male who is a primary care patient of Aaron Cords, DO. The CM team was consulted for assistance with chronic disease management and care coordination for medication assistance for Trulicity  Note that CMA Aaron Sosa reached out to patient this afternoon, who reported that Trulicity has been giving him muscle pain in his upper extremities  I reached out to Aaron Sosa by phone today.   Diabetes  Mr. Schaver reports that he last injected the Trulicity on 02/24/19, as this was his last dose. Reports that he has been having ongoing muscle weakness, particularly in his right leg (from his hip to knee) while on Trullicity. Reports that he now attributes this weakness to the Trulicity as the weakness seems to have improved since Sosa (03/03/19), being off of the medication. Denies pain with the weakness. Note patient reports that he had been giving the injections in the thigh as directed (last injected in right thigh).  Reports unable to tolerate metformin due to diarrhea side effect. Reports that he still has ongoing issues with diarrhea, including episodes severe enough to make it difficult for him to leave his home, which he attributes to the medication despite having discontinued taking it a year ago.  Denies checking blood sugar at home. States "everytime I check it, it depresses me".   Hypertension  Reports not taking prescribed lisinopril-hydrochlorothiazide as expresses dissatisfaction that tablet does not look the same as previous dose. Note per PCP note in chart from 12/30/18, provider indicated patient was previously on lisinopril 2.5 mg daily. Counsel patient on the importance of blood pressure control and encourage patient to take medication as directed. Patient verbalizes understanding, but states regarding his blood pressure: "I'm not worried about it."   Hyperlipidemia  Patient denies taking his rosuvastatin.  Reports that he stopped because it made his 'legs feel bad - achy". Note per allergy list in chart, patient has a history of myalgia with atorvastatin.    Plan:  1) Place a coordination of care call to CM Social Worker to discuss concerns about depression. 2) CM Social Worker will reach out to the patient over the next 8 days.  3) Will follow up with provider re: patient's upper leg weakness, complaint of ongoing diarrhea and concern about patient depression and medication adherence.   Aaron Sosa, PharmD, [facility] Clinical Pharmacist [facility] [employer] [phone]

## 2019-03-05 NOTE — Telephone Encounter (Signed)
Left message for patient to call back  

## 2019-03-07 ENCOUNTER — Telehealth: Payer: Self-pay

## 2019-03-10 ENCOUNTER — Telehealth: Payer: Self-pay

## 2019-03-10 NOTE — Telephone Encounter (Signed)
-----   Message from [name], DO sent at 03/07/2019  4:44 PM EDT ----- Regarding: Need apt for Depression screening Our pharmacist, Gentry Fitz, has identified that patient has reported depression symptoms to her.  We think this is why he is having problem taking his medications.  Could you schedule patient for a Virtual Visit by telephone only if he is able to do this within 1-2 weeks to discuss his Depressed Mood?  He can keep his apt for 04/07/19 for Diabetes.  Saralyn Pilar, DO [facility] 03/07/2019, 4:45 PM

## 2019-03-10 NOTE — Telephone Encounter (Signed)
I spoke with the patient. Appt schedule for Wednesday, May 13th @ 10:20am.

## 2019-03-12 ENCOUNTER — Other Ambulatory Visit: Payer: Self-pay

## 2019-03-12 ENCOUNTER — Ambulatory Visit (INDEPENDENT_AMBULATORY_CARE_PROVIDER_SITE_OTHER): Payer: PPO | Admitting: Family Medicine

## 2019-03-12 ENCOUNTER — Encounter: Payer: Self-pay | Admitting: Family Medicine

## 2019-03-12 DIAGNOSIS — M791 Myalgia, unspecified site: Secondary | ICD-10-CM | POA: Diagnosis not present

## 2019-03-12 DIAGNOSIS — T466X5A Adverse effect of antihyperlipidemic and antiarteriosclerotic drugs, initial encounter: Secondary | ICD-10-CM | POA: Insufficient documentation

## 2019-03-12 DIAGNOSIS — E1136 Type 2 diabetes mellitus with diabetic cataract: Secondary | ICD-10-CM

## 2019-03-12 NOTE — Progress Notes (Addendum)
Virtual Visit via Telephone The purpose of this virtual visit is to provide medical care while limiting exposure to the novel coronavirus (COVID19) for both patient and office staff.  Consent was obtained for phone visit:  Yes.   Answered questions that patient had about telehealth interaction:  Yes.   I discussed the limitations, risks, security and privacy concerns of performing an evaluation and management service by telephone. I also discussed with the patient that there may be a patient responsible charge related to this service. The patient expressed understanding and agreed to proceed.  Patient Location: Home Provider Location: [facility])  ---------------------------------------------------------------------- Chief Complaint  Patient presents with  . Diabetes    S: Reviewed CMA documentation. I have called patient and gathered additional HPI as follows:  CHRONIC DM, Type 2 Last visit 12/2018, recent updates has had uncontrolled elevated A1c >10-11. He has failed Ozempic and Trulicity due to cost and side effects, see prior note, he was injecting trulicity into thigh and developed aching and side effect had discontinued it, it had gradually started improving but still weak - Interval update - he has demonstrated frustration with his medications and side effects to myself and Duanne Moron [name] with Chronic Care Management team regarding trying to help manage his diabetes, particularly with GLP1 injections (muscle ache) and Metformin (diarrhea) and also Statin and BP medication - He also admits significant financial barrier, he says if copay is $45 approximately or more he cannot afford it for the new medication CBGs:he does not check regularly Meds: NONE currently - he self discontinued Trulicity 0.75 and Metformin Currently on ACEi - but he discontinued Lisinopril-HCTZ as well Lifestyle: - Diet (Still trying to improve diet) - Exercise (walking regularly  6-7k daily at home) Denies hypoglycemia, polyuria, visual changes, numbness or tingling.  Additionally - He does not have a known diagnosis of depression - but he continues to report frustration with his health and medication side effects, he says the medication are "worse than the problem" - Refuse PHQ - He had myalgia on statin  Denies any high risk travel to areas of current concern for COVID19. Denies any known or suspected exposure to person with or possibly with COVID19.  Denies any fevers, chills, sweats, body ache, cough, shortness of breath, sinus pain or pressure, headache, abdominal pain, diarrhea  Past Medical History:  Diagnosis Date  . Arthritis   . HOH (hard of hearing)   . Lower extremity edema    Social History   Tobacco Use  . Smoking status: Former Smoker    Packs/day: 2.00    Years: 5.00    Pack years: 10.00    Types: Cigarettes  . Smokeless tobacco: Former Engineer, water Use Topics  . Alcohol use: Yes    Alcohol/week: 1.0 standard drinks    Types: 1 Cans of beer per week  . Drug use: Never    Current Outpatient Medications:  .  acetaminophen (TYLENOL) 325 MG tablet, Take by mouth., Disp: , Rfl:  .  magnesium 30 MG tablet, Take 30 mg by mouth once., Disp: , Rfl:  .  Multiple Vitamin (MULTIVITAMIN) capsule, Take 1 capsule by mouth daily., Disp: , Rfl:  .  lisinopril-hydrochlorothiazide (ZESTORETIC) 20-12.5 MG tablet, Take 1 tablet by mouth daily. (Patient not taking: Reported on 03/12/2019), Disp: 90 tablet, Rfl: 1  Depression screen [facility] 2/9 09/24/2018 08/14/2018  Decreased Interest 0 0  Down, Depressed, Hopeless 0 0  PHQ - 2 Score 0 0  GAD 7 : Generalized Anxiety Score 03/12/2019  Nervous, Anxious, on Edge (No Data)    -------------------------------------------------------------------------- O: No physical exam performed due to remote telephone encounter.  Lab results reviewed.  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 Discussed options again - he is frustrated with medication side effects - but willing to try another once weekly injection if it may have reduced side effects. - Offered Bydureon BCise  once weekly injection - he has financial barrier said cannot pay >$45 copay, he would need financial assistance. - We are out of stock samples, we ordered more today will call patient when Bydureon is back in stock and he can come by for a nurse visit demo for his first injection - RECOMMEND ABDOMINAL injection, not thigh - should have reduced side effects, better tolerability, may take few 4-8 weeks prior to significant benefit. Also advised him we could consider other meds - SGLT2, DPP4, Insulin  Encourage improved lifestyle - low carb, low sugar diet, reduce portion size, continue improving regular exercise  Continue ACEi - failed statin due to myalgia - discontinued  Unable to schedule DM Eye due to cost/coverage, advised importance - he will reconsider again and try to schedule  Follow-up 1 month DM A1c med adjust        #Frustration w/ medication - Not consistent with clinical  depression Limited data from patient on this issue, refused PHQ9 and seems to not have clinical history of depression rather it is new problem due to difficulty with med side effects. - Discussed options, he is not interested in treating mood at this time, prefers less medicines - He would be willing to try other med for DM as above  Forward note to Duanne Moron [facility] (CCM) to review - for ongoing discussion, will likely need financial aid assistance for Bydureon BCise if he tolerates it well.  **NOTE - received update later today 03/12/19 approx 530pm by Duanne Moron [facility] regarding possibility of financial assistance with Bydureon, she does not think patient will meet their criteria, due to [place] requires patients to spend 3% of their household income out of pocket each calendar year before they can apply. Mr. Kane would not meet this requirement as he currently takes little prescription medication.  - She recommended consider Jardiance as next option - will pursue this** He may NOT need to come in Bydureon samples in near future once they arrive now, this is TBD  No orders of the defined types were placed in this encounter.   Follow-up: - Return on 04/07/19 for DM A1c  Patient verbalizes understanding with the above medical recommendations including the limitation of remote medical advice.  Specific  follow-up and call-back criteria were given for patient to follow-up or seek medical care more urgently if needed.   - Time spent in direct consultation with patient on phone: 8 minutes   Saralyn Pilar, DO [facility] 03/12/2019, 10:18 AM

## 2019-03-12 NOTE — Assessment & Plan Note (Signed)
Inadequate medication regimen currently due to med side effects / cost Last A1c uncontrolled 11.8 No hypoglycemia Complications - hyperglycemia, cataracts bilateral (s/p surgery), other including hyperlipidemia specifically obesity - increases risk of future cardiovascular complications  Failed Metformin (GI intolerance), Glipizide (side effect), Ozempic (unable to use), Trulicity (in thigh, muscle ache and weak)  Plan:  1. Discussed options again - he is frustrated with medication side effects - but willing to try another once weekly injection if it may have reduced side effects. - Offered Bydureon BCise 2mg  once weekly injection - he has financial barrier said cannot pay >$45 copay, he would need financial assistance. - We are out of stock samples, we ordered more today will call patient when Bydureon is back in stock and he can come by for a nurse visit demo for his first injection - RECOMMEND ABDOMINAL injection, not thigh - should have reduced side effects, better tolerability, may take few 4-8 weeks prior to significant benefit. Also advised him we could consider other meds - SGLT2, DPP4, Insulin  Encourage improved lifestyle - low carb, low sugar diet, reduce portion size, continue improving regular exercise  Continue ACEi - failed statin due to myalgia - discontinued  Unable to schedule DM Eye due to cost/coverage, advised importance - he will reconsider again and try to schedule  Follow-up 1 month DM A1c med adjust

## 2019-03-12 NOTE — Patient Instructions (Addendum)
We have ordered new injectable once weekly medicine samples -It is called Bydureon BCise  Similar injection but the pen looks different.  We will call you when it is in stock and you can come in for a demo injection - if it works then we can order more and try to get it at a discounted rate  Please schedule a Follow-up Appointment to: Return in about 26 days (around 04/07/2019) for diabetes.  If you have any other questions or concerns, please feel free to call the office or send a message through MyChart. You may also schedule an earlier appointment if necessary.  Additionally, you may be receiving a survey about your experience at our office within a few days to 1 week by e-mail or mail. We value your feedback.  Saralyn Pilar, DO [facility], [place]

## 2019-03-13 ENCOUNTER — Ambulatory Visit: Payer: Self-pay | Admitting: Pharmacist

## 2019-03-13 DIAGNOSIS — E1136 Type 2 diabetes mellitus with diabetic cataract: Secondary | ICD-10-CM

## 2019-03-13 NOTE — Chronic Care Management (AMB) (Signed)
  Chronic Care Management   Note  03/13/2019 Name: Aaron Sosa MRN: [id number] DOB: [birth year]  Aaron Sosa is a 78 y.o. year old male who is a primary care patient of [name], DO. The CM team was consulted for assistance with chronic disease management and care coordination for medication assistance.  Was unable to reach patient via telephone today and have left HIPAA compliant voicemail asking patient to return my call. (unsuccessful outreach #1)  Follow up plan: The CM team will reach out to the patient again over the next 5 days.   Duanne Moron, PharmD, [facility] Clinical Pharmacist [facility] [employer] [phone]

## 2019-03-13 NOTE — Patient Instructions (Signed)
Thank you allowing the Chronic Care Management Team to be a part of your care! It was a pleasure speaking with you today!     CCM (Chronic Care Management) Team    Janci Minor RN, BSN Nurse Care Coordinator  [phone]   Harlow Asa PharmD  Clinical Pharmacist  [phone]   Eula Fried LCSW Clinical Social Worker [phone]  Visit Information  Goals Addressed            This Visit's Progress   . Medication Assistance       Current Barriers:  . Financial . Limited hearing  Pharmacist Clinical Goal: Over the next 30 days, patient will work with CM Pharmacist to address needs related to medication assistance    Interventions: . Collaborate with PCP regarding diabetes medication management for patient o Per chart review, patients has failed: Metformin (GI intolerance), glipizide (side effect), Ozempic (unable to use) and Trulicity (side effect- muscle weakness) o Discuss options given patient's latest A1C of 11.8 (12/30/2018)  . Interview patient to assess eligibility for patient assistance/extra help programs o Patient not eligible for extra help through Social Security based on reported income o Patient eligible for [employer] patient assistance program based on reported income.  Collaborate with PCP and CM Social Worker  Patient Self Care Activities:   Attends all scheduled provider appointments  Calls pharmacy for medication refills   Initial goal documentation         Aaron Sosa was given information about Chronic Care Management services today including:  1. CCM service includes personalized support from designated clinical staff supervised by his physician, including individualized plan of care and coordination with other care providers 2. 24/7 contact phone numbers for assistance for urgent and routine care needs. 3. Service will only be billed when office clinical staff spend 20 minutes or more in a month to coordinate  care. 4. Only one practitioner may furnish and bill the service in a calendar month. 5. The patient may stop CCM services at any time (effective at the end of the month) by phone call to the office staff. 6. The patient will be responsible for cost sharing (co-pay) of up to 20% of the service fee (after annual deductible is met).  Patient agreed to services and verbal consent obtained.   The patient verbalized understanding of instructions provided today and declined a print copy of patient instruction materials.   Follow up with provider re: availability of samples of Jardiance  Harlow Asa, PharmD, [place] [phone]

## 2019-03-13 NOTE — Chronic Care Management (AMB) (Signed)
Chronic Care Management   Note  03/13/2019 Name: Aaron Sosa MRN: [id number] DOB: 1941-08-20   Subjective:   Aaron Sosa is a 78 y.o. year old male who is a primary care patient of Olin Hauser, DO. The CM team was consulted for assistance with chronic disease management and care coordination. Past medical history includes but is not limited to: hypertension, type 2 diabetes, hyperlipidemia, hearing loss of both ears, lower extremity edema and obesity.  I reached out to [name] by phone today.   Mr. Snedden was given information about Chronic Care Management services today including:  1. CCM service includes personalized support from designated clinical staff supervised by his physician, including individualized plan of care and coordination with other care providers 2. 24/7 contact phone numbers for assistance for urgent and routine care needs. 3. Service will only be billed when office clinical staff spend 20 minutes or more in a month to coordinate care. 4. Only one practitioner may furnish and bill the service in a calendar month. 5. The patient may stop CCM services at any time (effective at the end of the month) by phone call to the office staff. 6. The patient will be responsible for cost sharing (co-pay) of up to 20% of the service fee (after annual deductible is met).  Patient agreed to services and verbal consent obtained.   Review of patient status, including review of consultants reports, laboratory and other test data, was performed as part of comprehensive evaluation and provision of chronic care management services.   Objective: Lab Results  Component Value Date   CREATININE 0.84 09/17/2018   CREATININE 0.74 10/29/2014   CREATININE 0.92 10/28/2014    Lab Results  Component Value Date   HGBA1C 11.8 (A) 12/30/2018    Lipid Panel     Component Value Date/Time   CHOL 212 (H) 09/17/2018 0834   TRIG 93 09/17/2018 0834   HDL 49 09/17/2018 0834    CHOLHDL 4.3 09/17/2018 0834   LDLCALC 143 (H) 09/17/2018 0834    BP Readings from Last 3 Encounters:  12/30/18 (!) 160/84  09/24/18 130/70  08/14/18 138/78    Allergies  Allergen Reactions  . Atorvastatin Other (See Comments)    myalgia  . Codeine   . Shrimp [Shellfish Allergy]     Medications Reviewed Today    Reviewed by Olin Hauser, DO (Physician) on 03/12/19 at 1256  Med List Status: <None>  Medication Order Taking? Sig Documenting Provider Last Dose Status Informant  acetaminophen (TYLENOL) 325 MG tablet [id number] Yes Take by mouth. [provider] Taking Active   lisinopril-hydrochlorothiazide (ZESTORETIC) 20-12.5 MG tablet [id number] No Take 1 tablet by mouth daily.  Patient not taking:  Reported on 03/12/2019   Olin Hauser, DO Not Taking Active   magnesium 30 MG tablet [id number] Yes Take 30 mg by mouth once. [provider] Taking Active   Multiple Vitamin (MULTIVITAMIN) capsule [id number] Yes Take 1 capsule by mouth daily. [provider] Taking Active            Assessment:   Goals Addressed            This Visit's Progress   . Medication Assistance       Current Barriers:  . Financial . Limited hearing . Poor medication adherence - reluctance to take medication  Pharmacist Clinical Goal: Over the next 30 days, patient will work with CM Pharmacist to address needs related to medication assistance  Interventions: . Collaborate with PCP regarding diabetes medication management for patient o Per chart review, patients has failed: Metformin (GI intolerance), glipizide (side effect), Ozempic (unable to use) and Trulicity (side effect- muscle weakness) o Discuss options given patient's latest A1C of 11.8 (12/30/2018) with financial barrier and poor medication adherence . Interview patient to assess eligibility for patient assistance/extra help programs o Patient not eligible for extra help based on reported  income o Patient eligible for [employer] patient assistance program based on reported income.  Collaborate with PCP and CM Social Worker regarding concerns about patient's mood  Patient Self Care Activities:   Attends all scheduled provider appointments  Calls pharmacy for medication refills   Initial goal documentation         Plan:  1) Will follow up with provider re: samples for starting patient on Jardiance 2) CM team to follow up with patient in the next 7 days.  Harlow Asa, PharmD, [place] [employer] [phone]

## 2019-03-14 ENCOUNTER — Ambulatory Visit: Payer: Self-pay

## 2019-03-14 ENCOUNTER — Ambulatory Visit: Payer: Self-pay | Admitting: Pharmacist

## 2019-03-14 DIAGNOSIS — I10 Essential (primary) hypertension: Secondary | ICD-10-CM

## 2019-03-14 DIAGNOSIS — E1136 Type 2 diabetes mellitus with diabetic cataract: Secondary | ICD-10-CM

## 2019-03-14 NOTE — Chronic Care Management (AMB) (Signed)
  Chronic Care Management   Follow Up Note   03/14/2019 Name: Aaron Sosa MRN: [id number] DOB: 1941/06/02  Referred by: [name], DO Reason for referral : No chief complaint on file.   Aaron Sosa is a 78 y.o. year old male who is a primary care patient of [name], DO. The CCM team was consulted for assistance with chronic disease management and care coordination needs.  Patient with a past medical history including but not limited to: hypertension, type 2 diabetes, hyperlipidemia, hearing loss of both ears, lower extremity edema and obesity.  I reached out to [name] by phone today.   Review of patient status, including review of consultants reports, relevant laboratory and other test results, and collaboration with appropriate care team members and the patient's provider was performed as part of comprehensive patient evaluation and provision of chronic care management services.    Goals Addressed            This Visit's Progress   . "It is depressing to check my blood sugar"       Current Barriers:  . Financial . Limited hearing . Poor medication adherence - reluctance to take medication  Pharmacist Clinical Goal: Over the next 30 days, patient will work with CM Pharmacist to address needs related to medication assistance and medication adherence  Interventions: . Collaborate with PCP regarding diabetes medication management for patient o Per chart review, patients has failed: Metformin (GI intolerance), glipizide (side effect), Ozempic (unable to use) and Trulicity (side effect- muscle weakness) o Discuss options given patient's latest A1C of 11.8 (12/30/2018) with financial barrier and poor medication adherence o Samples of Jardiance unavailable  Will collaborate with [facility] CPhT to assist patient with patient assistance application for Jardiance made by [employer] patient on the importance of medication adherence   Patient not currently taking lisinopril-hydrochlorothiazide  Address patient's questions regarding lisinopril-hydrochlorothiazide and encourage patient to take as directed. Discuss benefits of adherence to this medication. Patient verbalizes understanding  Patient denies currently checking his blood sugar or knowing if his glucometer works.  Counsel patient about the importance of being able to check his blood sugar.  Request patient attempt using his meter and advise CM Pharmacist if meter not working  Inquire about patient's annual eye exam - Patient denies having had exam due to concerns about cost  Advise patient about plan benefit. Per [employer] summary of benefits, annual routine eye exam is a $0 copay service for in-network providers  Patient reports that he is interested in having appointment with [facility] once COVID-19 risk has decreased. Confirm for patient that this provider is included in the plan's network per plan website.  Patient Self Care Activities:   Patient currently NOT taking medications as prescribed  Attends all scheduled provider appointments  Patient to contact [name] about future scheduling for eye exam  Calls pharmacy for medication refills  Patient currently NOT checking blood sugar  Patient to try using glucometer to determine if meter works  Please see past updates related to this goal by clicking on the "Past Updates" button in the selected goal          Plan  CM Pharmacist will reach out to the patient again over the next 7 days.   Duanne Moron, PharmD, [name] Clinical Pharmacist [facility] [employer] [phone]

## 2019-03-14 NOTE — Patient Instructions (Signed)
Thank you allowing the Chronic Care Management Team to be a part of your care! It was a pleasure speaking with you today!     CCM (Chronic Care Management) Team    Janci Minor RN, BSN Nurse Care Coordinator  [phone]   Duanne Moron PharmD  Clinical Pharmacist  [phone]   Dickie La LCSW Clinical Social Worker [phone]  Visit Information  Goals Addressed            This Visit's Progress   . "It is depressing to check my blood sugar"       Current Barriers:  . Financial . Limited hearing  Pharmacist Clinical Goal: Over the next 30 days, patient will work with CM Pharmacist to address needs related to medication assistance and medication adherence  Interventions: . Collaborate with PCP regarding diabetes medication management for patient o Per chart review, patients has failed: Metformin (GI intolerance), glipizide (side effect), Ozempic (unable to use) and Trulicity (side effect- muscle weakness) o Samples of Jardiance unavailable  Will collaborate with [facility] CPhT to assist patient with patient assistance application for Jardiance made by [employer] patient on the importance of medication adherence  Patient not currently taking lisinopril-hydrochlorothiazide  Address patient's questions regarding lisinopril-hydrochlorothiazide and encourage patient to take as directed. Discuss benefits of adherence to this medication. Patient verbalizes understanding  Patient denies currently checking his blood sugar or knowing if his glucometer works.  Counsel patient about the importance of being able to check his blood sugar.  Request patient attempt using his meter and advise CM Pharmacist if meter not working  Inquire about patient's annual eye exam - Patient denies having had exam due to concerns about cost  Advise patient about plan benefit. Per [employer] summary of benefits, annual routine eye exam is a $0 copay service for  in-network providers  Patient reports that he is interested in having appointment with [facility] once COVID-19 risk has decreased. Confirm for patient that this provider is included in the plan's network per plan website.  Patient Self Care Activities:   Patient currently NOT taking medications as prescribed  Attends all scheduled provider appointments  Patient to contact [facility] about future scheduling for eye exam  Calls pharmacy for medication refills  Patient currently NOT checking blood sugar  Patient to try using glucometer to determine if meter works  Please see past updates related to this goal by clicking on the "Past Updates" button in the selected goal          The patient verbalized understanding of instructions provided today and declined a print copy of patient instruction materials.   The CM team will reach out to the patient again over the next 7 days.   Duanne Moron, PharmD, [facility] Clinical Pharmacist [facility] Medical [employer] [phone]

## 2019-03-18 ENCOUNTER — Other Ambulatory Visit: Payer: Self-pay | Admitting: Pharmacy Technician

## 2019-03-18 ENCOUNTER — Other Ambulatory Visit: Payer: Self-pay | Admitting: Family Medicine

## 2019-03-18 DIAGNOSIS — E1136 Type 2 diabetes mellitus with diabetic cataract: Secondary | ICD-10-CM

## 2019-03-18 NOTE — Patient Outreach (Signed)
[facility]) Care Management  03/18/2019  [place] Art Betten 03-Aug-1941 [id number]                          Medication Assistance Referral  Referral From: [facility] Vallery [facility])  Medication/Company: [employer] / [employer] Patient application portion:  Mailed Provider application portion: Faxed  to Dr. Althea Charon   Follow up:  Will follow up with patient in 5-10 business days to confirm application(s) have been received.  Rosario Kushner P. Jorge Retz, CPhT [employer] Care Management [phone]

## 2019-03-20 ENCOUNTER — Ambulatory Visit: Payer: Self-pay | Admitting: Licensed Clinical Social Worker

## 2019-03-20 ENCOUNTER — Telehealth: Payer: Self-pay | Admitting: Family Medicine

## 2019-03-20 ENCOUNTER — Telehealth: Payer: Self-pay

## 2019-03-20 NOTE — Chronic Care Management (AMB) (Signed)
  Chronic Care Management    Clinical Social Work General Note  03/20/2019 Name: [name] MRN: [id number] DOB: 03-Dec-1940  Aaron Sosa is a 78 y.o. year old male who is a primary care patient of [name], DO. The CCM was consulted to assist the patient with Mental Health Counseling and Resources. LCSW completed initial outreach attempt by phone today but was unable to reach him successfully. HIPPA compliant voice message left encouraging patient to return call once available. LCSW rescheduled outreach appointment as well.  Review of patient status, including review of consultants reports, relevant laboratory and other test results, and collaboration with appropriate care team members and the patient's provider was performed as part of comprehensive patient evaluation and provision of chronic care management services.    Follow Up Plan: SW will follow up with patient by phone over the next 2 weeks      Dickie La, BSW, MSW, [employer] [facility] Care Management [place]  [facility] [email or web address] Phone: [phone]

## 2019-03-20 NOTE — Telephone Encounter (Signed)
Pt  daughter called have question about pt. Mediation. [name] call back # is  [phone]

## 2019-03-20 NOTE — Telephone Encounter (Signed)
I spoke with the pt daughter and she was questioning what did Dr. Althea Charon prescribe for the patient blood pressure, because she state her father is not taking the medication. The pt said he is not going to take the medication because it looks different. I explained to her that it could be that the pharmacy changed purchasing manufacturers which sometimes change appearance. I advise her to contact the patient pharmacy to verify. She verbalize understanding.

## 2019-03-20 NOTE — Telephone Encounter (Signed)
Patient was working with Duanne Moron [facility] CCM pharmacy on this issue.  She has already addressed it with him.  This is from her documentation  Reports not taking prescribed lisinopril-hydrochlorothiazide as expresses dissatisfaction that tablet does not look the same as previous dose. Note per PCP note in chart from 12/30/18, provider indicated patient was previously on lisinopril 2.5 mg daily. Counsel patient on the importance of blood pressure control and encourage patient to take medication as directed  He should be on Lisinopril-HCTZ 20-12.5mg  once daily.  Saralyn Pilar, DO [name] 03/20/2019, 1:13 PM

## 2019-03-21 ENCOUNTER — Ambulatory Visit (INDEPENDENT_AMBULATORY_CARE_PROVIDER_SITE_OTHER): Payer: PPO | Admitting: Pharmacist

## 2019-03-21 DIAGNOSIS — E1136 Type 2 diabetes mellitus with diabetic cataract: Secondary | ICD-10-CM

## 2019-03-21 DIAGNOSIS — I10 Essential (primary) hypertension: Secondary | ICD-10-CM

## 2019-03-21 NOTE — Patient Instructions (Signed)
Thank you allowing the Chronic Care Management Team to be a part of your care! It was a pleasure speaking with you today!     CCM (Chronic Care Management) Team    Janci Minor RN, BSN Nurse Care Coordinator  [phone]   Duanne Moron PharmD  Clinical Pharmacist  [phone]   Dickie La LCSW Clinical Social Worker [phone]  Visit Information  Goals Addressed            This Visit's Progress   . "It is depressing to check my blood sugar"       Current Barriers:  . Financial . Limited hearing . Poor medication adherence - reluctance to take medication . Lack of blood pressure or blood sugar results for clinical team  Pharmacist Clinical Goal: Over the next 30 days, patient will work with CM Pharmacist to address needs related to medication assistance and medication adherence  Interventions: Perform chart review Note that patient's daughter, Dewayne Hatch, called clinic yesterday regarding questions about why the patient's blood pressure medication looks different.  Follow up call to patient's daughter today regarding patient's blood pressure management. Counsel [name] about the importance of blood pressure control. Dewayne Hatch reports that after speaking with her father, patient restarted taking his lisinopril-hydrochlorthiazde 20-12.5 mg, but only 1/2 tablet daily.   Reports patient only willing to take 1/2 tablet daily as this was the dose that he believes that his previous PCP prescribed.  Counsel Mr. Blankenburg again on the importance of medication adherence and blood pressure monitoring  Patient reports that he is taking lisinopril-hydrochlorthiazde 20-12.5 mg, but only 1/2 tablet daily.   Patient denies checking his blood pressure, but agrees to start checking once daily and keeping a log  Collaborate with [name] CPhT to assist patient with patient assistance application for Jardiance made by [employer]  Mr. Diedrich confirms that he received the application and  has started working on completing it  Patient denies having located his glucometer to see if it works  Psychologist, sport and exercise patient about the importance of being able to check his blood sugar.  Again request patient locate and attempt using his meter and advise CM Pharmacist if meter not working  Patient Self Care Activities:   Patient currently NOT taking medications as prescribed  Attends all scheduled provider appointments  Patient to contact [facility] about future scheduling for eye exam  Calls pharmacy for medication refills  Patient currently NOT checking blood sugar  Patient to try using glucometer to determine if meter works  Patient to check blood pressure daily until our next call  Please see past updates related to this goal by clicking on the "Past Updates" button in the selected goal          The patient verbalized understanding of instructions provided today and declined a print copy of patient instruction materials.   The CM team will reach out to the patient again over the next 7 days.   Duanne Moron, PharmD, [facility] Clinical Pharmacist [facility] [employer] [phone]

## 2019-03-21 NOTE — Chronic Care Management (AMB) (Signed)
Chronic Care Management   Follow Up Note   03/21/2019 Name: Aaron Sosa MRN: [id number] DOB: August 20, 1941  Referred by: Aaron Cords, DO Reason for referral : Chronic Care Management (Patient Phone Call)   Aaron Sosa is a 78 y.o. year old male who is a primary care patient of Aaron Cords, DO. The CCM team was consulted for assistance with chronic disease management and care coordination needs. Patient with a past medical history including but not limited to: hypertension, type 2 diabetes, hyperlipidemia, hearing loss of both ears, lower extremity edema and obesity.  I reach out to patient's daughter, Aaron Sosa (listed on Designated Party Release in chart), today to address her questions about patient's blood pressure medication.  I reached out to Aaron Sosa by phone today.   Review of patient status, including review of consultants reports, relevant laboratory and other test results, and collaboration with appropriate care team members and the patient's provider was performed as part of comprehensive patient evaluation and provision of chronic care management services.    Goals Addressed            This Visit's Progress    "It is depressing to check my blood sugar"       Current Barriers:   Financial  Limited hearing  Poor medication adherence - reluctance to take medication  Lack of blood pressure or blood sugar results for clinical team  Pharmacist Clinical Goal: Over the next 30 days, patient will work with CM Pharmacist to address needs related to medication assistance and medication adherence  Interventions:  Perform chart review  Note that patients daughter, Aaron Sosa, called clinic yesterday regarding questions about why the patients blood pressure medication looks different.  Follow up call to patient's daughter today regarding patient's blood pressure management. Counsel Aaron Sosa about the importance of blood pressure control. Aaron Sosa reports  that after speaking with her father, patient restarted taking his lisinopril-hydrochlorthiazde 20-12.5 mg, but only 1/2 tablet daily.   Reports patient only willing to take 1/2 tablet daily as this was the dose that he believes that his previous PCP prescribed.  Counsel Aaron Sosa again on the importance of medication adherence and blood pressure monitoring  Patient reports that he is taking lisinopril-hydrochlorthiazde 20-12.5 mg, but only 1/2 tablet daily.   Patient denies checking his blood pressure, but agrees to start checking once daily and keeping a log  Collaborate with [facility] CPhT to assist patient with patient assistance application for Jardiance made by [employer]  Aaron Sosa confirms that he received the application and has started working on completing it  Patient denies having located his glucometer to see if it works  Psychologist, sport and exercise patient about the importance of being able to check his blood sugar.  Again request patient locate and attempt using his meter and advise CM Pharmacist if meter not working  Patient Self Care Activities:   Patient currently NOT taking medications as prescribed  Attends all scheduled provider appointments  Patient to contact [facility] about future scheduling for eye exam  Calls pharmacy for medication refills  Patient currently NOT checking blood sugar  Patient to try using glucometer to determine if meter works  Patient to check blood pressure daily until our next call  Please see past updates related to this goal by clicking on the "Past Updates" button in the selected goal          Plan  The CM team will reach out to the patient again over  the next 7 days.   Aaron Sosa, PharmD, [facility] Clinical Pharmacist [facility] [employer] [phone]

## 2019-03-25 ENCOUNTER — Ambulatory Visit: Payer: Self-pay | Admitting: Pharmacist

## 2019-03-25 DIAGNOSIS — I10 Essential (primary) hypertension: Secondary | ICD-10-CM | POA: Diagnosis not present

## 2019-03-25 DIAGNOSIS — E1136 Type 2 diabetes mellitus with diabetic cataract: Secondary | ICD-10-CM

## 2019-03-25 NOTE — Chronic Care Management (AMB) (Signed)
Chronic Care Management   Follow Up Note   03/25/2019 Name: Aaron Sosa MRN: [id number] DOB: 02/24/1941  Referred by: [name], DO Reason for referral : Chronic Care Management (Patient Phone Call)   PRAVIN RUMMEL is a 78 y.o. year old male who is a primary care patient of [name], DO. The CCM team was consulted for assistance with chronic disease management and care coordination needs.  Patient with a past medical history including but not limited to: hypertension, type 2 diabetes, hyperlipidemia, hearing loss of both ears, lower extremity edema and obesity.  Receive a voicemail from patient's daughter, Hope Budds (listed on [employer] in chart). Outreach to [place] to address her questions about the [name] patient assistance application.  I also reached out to [name] by phone today.   Review of patient status, including review of consultants reports, relevant laboratory and other test results, and collaboration with appropriate care team members and the patient's provider was performed as part of comprehensive patient evaluation and provision of chronic care management services.    Goals Addressed            This Visit's Progress   . "It is depressing to check my blood sugar"       Current Barriers:  . Financial . Limited hearing . Poor medication adherence - reluctance to take medication . Lack of blood pressure or blood sugar results for clinical team . Lack of knowledge about type 2 diabetes disease state  Pharmacist Clinical Goal: Over the next 30 days, patient will work with CM Pharmacist to address needs related to medication assistance and medication adherence  Interventions:  Counsel Mr. Castille again on the importance of medication adherence and blood pressure monitoring  Patient reports adherence to lisinopril-hydrochlorthiazde 20-12.5 mg, but only 1/2 tablet daily.   Patient reports checking his blood pressure  this weekend. States that his readings were "pretty good", but is unable to remember the specific readings and did not record these numbers  Offer to provide patient with a blood pressure log, but patient declines, stating that he will write down the numbers on his own paper  Patient reports locating and successfully using his glucometer  States that he believes that his fasting morning blood sugar on Saturday was 281 mg/dL  Counsel patient about importance of blood sugar control.  Patient states that he does not understand how his blood sugar could be running so high when he is doing so well with his diet.   Provide patient with verbal type 2 diabetes education counseling to explain how factors aside from diet also affect blood sugar control  Collaborate with [name] to assist patient with patient assistance application for Jardiance made by [employer]  Address patient's daughter's questions regarding application. Dewayne Hatch confirms that she is assisting the patient with completing the application and will mail it back to [facility] CPhT.  Patient Self Care Activities:   Patient currently NOT taking medications as prescribed  Attends all scheduled provider appointments  Patient to contact [facility] about future scheduling for eye exam  Calls pharmacy for medication refills  Patient currently NOT regularly checking blood sugar  Patient to check blood pressure daily and keep log until our next call  Please see past updates related to this goal by clicking on the "Past Updates" button in the selected goal          Plan  The CM Pharmacist will reach out to the  patient again over the next 7 days.   Duanne Moron, PharmD, [facility] Clinical Pharmacist [facility] [employer] [phone]

## 2019-03-28 ENCOUNTER — Other Ambulatory Visit: Payer: Self-pay | Admitting: Pharmacy Technician

## 2019-03-28 NOTE — Patient Outreach (Signed)
[facility]) Care Management  03/28/2019  Aaron Sosa 1941/01/15 [id number]   Successful outreach call placed to patient in regards to [name] application for Jardiance.   Spoke to patient, HIPAA identifiers verified.  Patient informed he had received the paperwork and that his daughter had mailed back the application this week.  Will followup with patient I 10-14 business days if application has not been received back.  Luccia Reinheimer P. Athleen Feltner, CPhT [facility] [phone]

## 2019-04-01 ENCOUNTER — Ambulatory Visit: Payer: Self-pay | Admitting: Pharmacist

## 2019-04-01 DIAGNOSIS — E1136 Type 2 diabetes mellitus with diabetic cataract: Secondary | ICD-10-CM

## 2019-04-01 DIAGNOSIS — I10 Essential (primary) hypertension: Secondary | ICD-10-CM

## 2019-04-01 NOTE — Patient Instructions (Addendum)
Thank you allowing the Chronic Care Management Team to be a part of your care! It was a pleasure speaking with you today!     CCM (Chronic Care Management) Team    Janci Minor RN, BSN Nurse Care Coordinator  [phone]   Duanne MoronElisabeth Cathlene Gardella PharmD  Clinical Pharmacist  [phone]   Dickie LaBrooke Joyce LCSW Clinical Social Worker [phone]  Visit Information  Goals Addressed            This Visit's Progress   . "It is depressing to check my blood sugar"       Current Barriers:  . Financial . Limited hearing . Poor medication adherence - reluctance to take medication  Pharmacist Clinical Goal: Over the next 30 days, patient will work with CM Pharmacist to address needs related to medication assistance and medication adherence  Interventions:  Counsel Mr. Goodspeed again on the importance of medication adherence and blood pressure monitoring  Patient reports adherence to lisinopril-hydrochlorthiazde 20-12.5 mg, but only 1/2 tablet daily.   Patient reports checking his blood pressure on 5/25: 132/68 (HR not recorded)  Counsel patient on the importance of blood sugar control  Congratulate patient on restarting checking his blood sugar daily  Provide verbal education on diabetes and nutrition  Patient requests large-print diabetes nutrition education handout - mail to patient  [facility] to assist patient with patient assistance application for Jardiance made by [employer]  Mr. Spero GeraldsFriddle confirms that his daughter mailed his portion of the application back to [place] last week.  Patient Self Care Activities:   Patient making dietary modifications  Discontinued eating pack of crackers as daily snack  Substitution of protein, such as scrambled eggs in place of grits for breakfast  Working on portion size of oatmeal - eating one packet rather than two with a meal  Patient to take medications as prescribed  Attends all scheduled provider  appointments  [facility]  Calls pharmacy for medication refills  Patient to check blood sugar regularly and keep log Date Fasting Blood Glucose  28 - May 247  29 - May 245  30 - May 270  31 - May 201  1 - June 207  2 - June 200  Average 228    Patient to check blood pressure daily and keep log  Please see past updates related to this goal by clicking on the "Past Updates" button in the selected goal           The patient verbalized understanding of instructions provided today and declined a print copy of patient instruction materials.   The care management team will reach out to the patient again over the next 7 days.   Duanne MoronElisabeth Izmael Duross, PharmD, [facility] Clinical Pharmacist [facility] [phone]   Diabetes Mellitus and Nutrition, Adult When you have diabetes (diabetes mellitus), it is very important to have healthy eating habits because your blood sugar (glucose) levels are greatly affected by what you eat and drink. Eating healthy foods in the appropriate amounts, at about the same times every day, can help you:  Control your blood glucose.  Lower your risk of heart disease.  Improve your blood pressure.  Reach or maintain a healthy weight. Every person with diabetes is different, and each person has different needs for a meal plan. Your health care provider may recommend that you work with a diet and nutrition specialist (dietitian) to make a  meal plan that is best for you. Your meal plan may vary depending on factors such as:  The calories you need.  The medicines you take.  Your weight.  Your blood glucose, blood pressure, and cholesterol levels.  Your activity level.  Other health conditions you have, such as heart or kidney disease. How do carbohydrates affect me? Carbohydrates, also called carbs, affect your blood glucose level more than any other  type of food. Eating carbs naturally raises the amount of glucose in your blood. Carb counting is a method for keeping track of how many carbs you eat. Counting carbs is important to keep your blood glucose at a healthy level, especially if you use insulin or take certain oral diabetes medicines. It is important to know how many carbs you can safely have in each meal. This is different for every person. Your dietitian can help you calculate how many carbs you should have at each meal and for each snack. Foods that contain carbs include:  Bread, cereal, rice, pasta, and crackers.  Potatoes and corn.  Peas, beans, and lentils.  Milk and yogurt.  Fruit and juice.  Desserts, such as cakes, cookies, ice cream, and candy. How does alcohol affect me? Alcohol can cause a sudden decrease in blood glucose (hypoglycemia), especially if you use insulin or take certain oral diabetes medicines. Hypoglycemia can be a life-threatening condition. Symptoms of hypoglycemia (sleepiness, dizziness, and confusion) are similar to symptoms of having too much alcohol. If your health care provider says that alcohol is safe for you, follow these guidelines:  Limit alcohol intake to no more than 1 drink per day for nonpregnant women and 2 drinks per day for men. One drink equals 12 oz of beer, 5 oz of wine, or 1 oz of hard liquor.  Do not drink on an empty stomach.  Keep yourself hydrated with water, diet soda, or unsweetened iced tea.  Keep in mind that regular soda, juice, and other mixers may contain a lot of sugar and must be counted as carbs. What are tips for following this plan?  Reading food labels  Start by checking the serving size on the "Nutrition Facts" label of packaged foods and drinks. The amount of calories, carbs, fats, and other nutrients listed on the label is based on one serving of the item. Many items contain more than one serving per package.  Check the total grams (g) of carbs in one  serving. You can calculate the number of servings of carbs in one serving by dividing the total carbs by 15. For example, if a food has 30 g of total carbs, it would be equal to 2 servings of carbs.  Check the number of grams (g) of saturated and trans fats in one serving. Choose foods that have low or no amount of these fats.  Check the number of milligrams (mg) of salt (sodium) in one serving. Most people should limit total sodium intake to less than 2,300 mg per day.  Always check the nutrition information of foods labeled as "low-fat" or "nonfat". These foods may be higher in added sugar or refined carbs and should be avoided.  Talk to your dietitian to identify your daily goals for nutrients listed on the label. Shopping  Avoid buying canned, premade, or processed foods. These foods tend to be high in fat, sodium, and added sugar.  Shop around the outside edge of the grocery store. This includes fresh fruits and vegetables, bulk grains, fresh meats, and fresh dairy.  Cooking  Use low-heat cooking methods, such as baking, instead of high-heat cooking methods like deep frying.  Cook using healthy oils, such as olive, canola, or sunflower oil.  Avoid cooking with butter, cream, or high-fat meats. Meal planning  Eat meals and snacks regularly, preferably at the same times every day. Avoid going long periods of time without eating.  Eat foods high in fiber, such as fresh fruits, vegetables, beans, and whole grains. Talk to your dietitian about how many servings of carbs you can eat at each meal.  Eat 4-6 ounces (oz) of lean protein each day, such as lean meat, chicken, fish, eggs, or tofu. One oz of lean protein is equal to: ? 1 oz of meat, chicken, or fish. ? 1 egg. ?  cup of tofu.  Eat some foods each day that contain healthy fats, such as avocado, nuts, seeds, and fish. Lifestyle  Check your blood glucose regularly.  Exercise regularly as told by your health care provider.  This may include: ? 150 minutes of moderate-intensity or vigorous-intensity exercise each week. This could be brisk walking, biking, or water aerobics. ? Stretching and doing strength exercises, such as yoga or weightlifting, at least 2 times a week.  Take medicines as told by your health care provider.  Do not use any products that contain nicotine or tobacco, such as cigarettes and e-cigarettes. If you need help quitting, ask your health care provider.  Work with a Veterinary surgeon or diabetes educator to identify strategies to manage stress and any emotional and social challenges. Questions to ask a health care provider  Do I need to meet with a diabetes educator?  Do I need to meet with a dietitian?  What number can I call if I have questions?  When are the best times to check my blood glucose? Where to find more information:  American Diabetes Association: diabetes.org  Academy of Nutrition and Dietetics: www.eatright.[employer] of Diabetes and Digestive and Kidney Diseases (NIH): CarFlippers.tn Summary  A healthy meal plan will help you control your blood glucose and maintain a healthy lifestyle.  Working with a diet and nutrition specialist (dietitian) can help you make a meal plan that is best for you.  Keep in mind that carbohydrates (carbs) and alcohol have immediate effects on your blood glucose levels. It is important to count carbs and to use alcohol carefully. This information is not intended to replace advice given to you by your health care provider. Make sure you discuss any questions you have with your health care provider. Document Released: 07/13/2005 Document Revised: 05/16/2017 Document Reviewed: 11/20/2016 Elsevier Interactive Patient Education  2019 [employer].

## 2019-04-01 NOTE — Chronic Care Management (AMB) (Signed)
  Chronic Care Management   Follow Up Note   04/01/2019 Name: Aaron Sosa MRN: [id number] DOB: 09/01/1941  Referred by: [name], DO Reason for referral : Chronic Care Management (Patient Phone Call)   Aaron Sosa is a 78 y.o. year old male who is a primary care patient of [name], DO. The CCM team was consulted for assistance with chronic disease management and care coordination needs.  Patient with a past medical history including but not limited to: hypertension, type 2 diabetes, hyperlipidemia, hearing loss of both ears, lower extremity edema and obesity.  I reached out to Jerrye Beavers Scovill by phone today to follow up regarding medication adherence, medication assistance and diabetes education.  Review of patient status, including review of consultants reports, relevant laboratory and other test results, and collaboration with appropriate care team members and the patient's provider was performed as part of comprehensive patient evaluation and provision of chronic care management services.    Goals Addressed            This Visit's Progress   . "It is depressing to check my blood sugar"       Current Barriers:  . Financial . Limited hearing . Poor medication adherence - reluctance to take medication . Lack of knowledge about type 2 diabetes disease state  Pharmacist Clinical Goal: Over the next 30 days, patient will work with CM Pharmacist to address needs related to medication assistance and medication adherence  Interventions:  Counsel Mr. Daris again on the importance of medication adherence and blood pressure monitoring  Patient reports adherence to lisinopril-hydrochlorthiazde 20-12.5 mg, but only 1/2 tablet daily.   Patient reports checking his blood pressure on 5/25: 132/68 (HR not recorded)  Counsel patient on the importance of blood sugar control  Congratulate patient on restarting checking his blood sugar daily  Provide verbal  education on diabetes and nutrition  Patient requests large-print diabetes nutrition education handout - mail to patient  Collaborate with [name] CPhT to assist patient with patient assistance application for Jardiance made by [employer]  Mr. Serratore confirms that his daughter mailed his portion of the application back to [place] last week.  Patient Self Care Activities:   Patient making dietary modifications  Discontinued eating pack of crackers as daily snack  Substitution of protein, such as scrambled eggs in place of grits for breakfast  Working on portion size of oatmeal - eating one packet rather than two with a meal  Patient to take medications as prescribed  Attends all scheduled provider appointments  Patient to [name] about future scheduling for eye exam  Calls pharmacy for medication refills  Patient to check blood sugar regularly and keep log Date Fasting Blood Glucose  28 - May 247  29 - May 245  30 - May 270  31 - May 201  1 - June 207  2 - June 200  Average 228    Patient to check blood pressure daily and keep log  Please see past updates related to this goal by clicking on the "Past Updates" button in the selected goal          Plan  The care management team will reach out to the patient again over the next 7 days.   Aaron Sosa, PharmD, [facility] Clinical Pharmacist [facility] [employer] [phone]

## 2019-04-03 ENCOUNTER — Other Ambulatory Visit: Payer: Self-pay

## 2019-04-03 ENCOUNTER — Ambulatory Visit: Payer: PPO | Admitting: Licensed Clinical Social Worker

## 2019-04-03 DIAGNOSIS — E1136 Type 2 diabetes mellitus with diabetic cataract: Secondary | ICD-10-CM

## 2019-04-03 NOTE — Chronic Care Management (AMB) (Signed)
  Care Management Note   Aaron Sosa is a 78 y.o. year old male who is a primary care patient of [name], DO. The CM team was consulted for assistance with chronic disease management and care coordination.   I reached out to [name] by phone today.   Review of patient status, including review of consultants reports, relevant laboratory and other test results, and collaboration with appropriate care team members and the patient's provider was performed as part of comprehensive patient evaluation and provision of chronic care management services.   Goals Addressed    . "I want to imrove my depression but don't want to do therapy" (pt-stated)       Current Barriers:  . Limited social support . ADL IADL limitations . Social Isolation . Limited access to caregiver . Lacks knowledge of community resource: available mental health support resource options within the area  Clinical Social Work Clinical Goal(s):  Marland Kitchen Over the next 90 days, client will work with SW to address concerns related to depression management  Interventions: . Patient interviewed and appropriate assessments performed . Provided mental health counseling with regard to depression (mental health diagnosis or concern) . Provided patient with information about ways to combat depressive symptoms when they arise. LCSW provided education on deep breathing and relaxation techniques to implement into his daily routine to combat stressors  . Discussed plans with patient for ongoing care management follow up and provided patient with direct contact information for care management team . Advised patient to consider gaining mental health support such as free individual grief counseling with [facility] or considering psychotropic medication treatment but patient declined ALL mental health support . Assisted patient/caregiver with obtaining information about health plan benefits  Patient Self Care Activities:  .  Attends all scheduled provider appointments . Calls provider office for new concerns or questions  Initial goal documentation   Follow Up Plan: The care management team will reach out to the patient again over the next 30 days.   Dickie La, BSW, MSW, LCSW [facility] [place]  [facility] [email or web address] Phone: [phone]

## 2019-04-07 ENCOUNTER — Ambulatory Visit: Payer: PPO | Admitting: Family Medicine

## 2019-04-08 ENCOUNTER — Telehealth: Payer: Self-pay

## 2019-04-08 ENCOUNTER — Ambulatory Visit: Payer: Self-pay | Admitting: Pharmacist

## 2019-04-08 ENCOUNTER — Other Ambulatory Visit: Payer: Self-pay | Admitting: Pharmacy Technician

## 2019-04-08 DIAGNOSIS — E1136 Type 2 diabetes mellitus with diabetic cataract: Secondary | ICD-10-CM

## 2019-04-08 NOTE — Patient Outreach (Signed)
[facility]) Care Management  04/08/2019  Aaron Sosa 06/29/1941 [id number]    Received all necessary documents and signatures from both patient and provider for [employer] patient assistance for Jardiance.  Submitted completed application via fax to [facility].  Will follow up with BI in 7-10 business days to inquire on status of application.  [name] P. Petros Ahart, [place] Management [phone]

## 2019-04-08 NOTE — Patient Instructions (Signed)
Thank you allowing the Chronic Care Management Team to be a part of your care! It was a pleasure speaking with you today!     CCM (Chronic Care Management) Team    Janci Minor RN, BSN Nurse Care Coordinator  [phone]   Harlow Asa PharmD  Clinical Pharmacist  [phone]   Eula Fried LCSW Clinical Social Worker [phone]  Visit Information  Goals Addressed            This Visit's Progress   . "It is depressing to check my blood sugar"       Current Barriers:  . Financial . Limited hearing . Poor medication adherence - reluctance to take medication . Lack of knowledge about type 2 diabetes disease state  Pharmacist Clinical Goal: Over the next 30 days, patient will work with CM Pharmacist to address needs related to medication assistance and medication adherence  Interventions:  Collaborate with [name] to assist patient with patient assistance application for Jardiance made by [employer]  Per chart, [name] received completed application back from patient via mail today and faxed application (patient and provider portions) onto [place]  Patient Self Care Activities:   Patient to take medications as prescribed  Attends all scheduled provider appointments  Patient to contact [facility] about future scheduling for eye exam  Calls pharmacy for medication refills  Patient to check blood sugar regularly and keep log  Patient to check blood pressure daily and keep log  Please see past updates related to this goal by clicking on the "Past Updates" button in the selected goal          The patient verbalized understanding of instructions provided today and declined a print copy of patient instruction materials.   Telephone follow up appointment with CM Pharmacist rescheduled for: 04/10/19 at 2 pm  Harlow Asa, PharmD, [place] [phone]

## 2019-04-08 NOTE — Chronic Care Management (AMB) (Signed)
  Chronic Care Management   Follow Up Note   04/08/2019 Name: Aaron Sosa MRN: [id number] DOB: 1941/10/22  Referred by: Olin Hauser, DO Reason for referral : Chronic Care Management (Patient Phone Call)   Aaron Sosa is a 78 y.o. year old male who is a primary care patient of Olin Hauser, DO. The CCM team was consulted for assistance with chronic disease management and care coordination needs.  Patient with a past medical history including but not limited to: hypertension, type 2 diabetes, hyperlipidemia, hearing loss of both ears, lower extremity edema and obesity.  I reached out to [place] by phone today to follow up regarding medication adherence, medication assistance and diabetes education.  Aaron Sosa reports that he is doing well. Patient lets me know that he is currently not home. Reschedule appointment with patient.  Review of patient status, including review of consultants reports, relevant laboratory and other test results, and collaboration with appropriate care team members and the patient's provider was performed as part of comprehensive patient evaluation and provision of chronic care management services.    Goals Addressed            This Visit's Progress   . "It is depressing to check my blood sugar"       Current Barriers:  . Financial . Limited hearing . Poor medication adherence - reluctance to take medication . Lack of knowledge about type 2 diabetes disease state  Pharmacist Clinical Goal: Over the next 30 days, patient will work with CM Pharmacist to address needs related to medication assistance and medication adherence  Interventions:  Collaborate with [name] to assist patient with patient assistance application for Jardiance made by [employer]  Per chart, [name] received completed application back from patient via mail today and faxed application (patient and provider portions) onto [place]   Patient Self Care Activities:   Patient making dietary modifications  Discontinued eating pack of crackers as daily snack  Substitution of protein, such as scrambled eggs in place of grits for breakfast  Working on portion size of oatmeal - eating one packet rather than two with a meal  Patient to take medications as prescribed  Attends all scheduled provider appointments  Patient to contact [name] about future scheduling for eye exam  Calls pharmacy for medication refills  Patient to check blood sugar regularly and keep log  Patient to check blood pressure daily and keep log  Please see past updates related to this goal by clicking on the "Past Updates" button in the selected goal          Plan  Telephone follow up appointment with CM Pharmacist rescheduled for: 04/10/19 at 2 pm  Aaron Sosa, PharmD, [place] [phone]

## 2019-04-10 ENCOUNTER — Ambulatory Visit (INDEPENDENT_AMBULATORY_CARE_PROVIDER_SITE_OTHER): Payer: PPO | Admitting: Pharmacist

## 2019-04-10 DIAGNOSIS — I10 Essential (primary) hypertension: Secondary | ICD-10-CM | POA: Diagnosis not present

## 2019-04-10 DIAGNOSIS — E1136 Type 2 diabetes mellitus with diabetic cataract: Secondary | ICD-10-CM

## 2019-04-10 NOTE — Patient Instructions (Signed)
Thank you allowing the Chronic Care Management Team to be a part of your care! It was a pleasure speaking with you today!     CCM (Chronic Care Management) Team    Janci Minor RN, BSN Nurse Care Coordinator  [phone]   Harlow Asa PharmD  Clinical Pharmacist  [phone]   Eula Fried LCSW Clinical Social Worker [phone]  Visit Information  Goals Addressed            This Visit's Progress   . "It is depressing to check my blood sugar"       Current Barriers:  . Financial . Limited hearing . Poor medication adherence - reluctance to take medication . Lack of knowledge about type 2 diabetes disease state  Pharmacist Clinical Goal: Over the next 30 days, patient will work with CM Pharmacist to address needs related to medication assistance and medication adherence  Interventions:  Discuss with patient the importance of medication adherence and blood pressure monitoring  Patient reports adherence to lisinopril-hydrochlorthiazde 20-12.5 mg, but only 1/2 tablet daily  Reports that his blood pressure this morning was 124/68, HR 70  Counsel patient on the importance of blood sugar control  Patient reports continuing to focus on controlling his carbohydrate intake  Expresses enthusiasm for starting Jardiance.  Address patient's questions about common side effects with Jardiance  Collaborate with [facility] CPhT to assist patient with patient assistance application for Jardiance made by [employer]  Per chart, [place] received completed application back from patient and faxed application (patient and provider portions) onto [place]  Patient Self Care Activities:   Patient making dietary modifications  Working on controlling portion size of carbohydrates  Patient to take medications as prescribed  Attends all scheduled provider appointments  Patient to contact [facility] about future scheduling for eye exam  Calls  pharmacy for medication refills  Patient to check blood sugar regularly and keep log Date Fasting Blood Glucose  8 - June 213  9 - June 191  10 - June 195  11 - June 223   Patient to check blood pressure daily and keep log  Please see past updates related to this goal by clicking on the "Past Updates" button in the selected goal          The patient verbalized understanding of instructions provided today and declined a print copy of patient instruction materials.   Telephone follow up appointment with care management team member scheduled for: 6/25 at 1 pm  Harlow Asa, PharmD, [place] [phone]

## 2019-04-10 NOTE — Chronic Care Management (AMB) (Signed)
  Chronic Care Management   Follow Up Note   04/10/2019 Name: Aaron Sosa MRN: [id number] DOB: 01/21/1941  Referred by: Olin Hauser, DO Reason for referral : Chronic Care Management (Patient Phone Call)   Aaron Sosa is a 78 y.o. year old male who is a primary care patient of Olin Hauser, DO. The CCM team was consulted for assistance with chronic disease management and care coordination needs.  Patient with a past medical history including but not limited to: hypertension, type 2 diabetes, hyperlipidemia, hearing loss of both ears, lower extremity edema and obesity.  I reached out to [name] by phone today to follow up regarding medication adherence, medication assistance and diabetes education.  Review of patient status, including review of consultants reports, relevant laboratory and other test results, and collaboration with appropriate care team members and the patient's provider was performed as part of comprehensive patient evaluation and provision of chronic care management services.    Goals Addressed            This Visit's Progress   . "It is depressing to check my blood sugar"       Current Barriers:  . Financial . Limited hearing . Poor medication adherence - reluctance to take medication . Lack of knowledge about type 2 diabetes disease state  Pharmacist Clinical Goal: Over the next 30 days, patient will work with CM Pharmacist to address needs related to medication assistance and medication adherence  Interventions:  Discuss with patient the importance of medication adherence and blood pressure monitoring  Patient reports adherence to lisinopril-hydrochlorthiazde 20-12.5 mg, but only 1/2 tablet daily  Reports that his blood pressure this morning was 124/68, HR 70  Counsel patient on the importance of blood sugar control  Patient reports continuing to focus on controlling his carbohydrate intake  Expresses enthusiasm for  starting Jardiance.  Address patient's questions about common side effects with Jardiance  Collaborate with [name] to assist patient with patient assistance application for Jardiance made by [employer]  Per chart, [name] received completed application back from patient and faxed application (patient and provider portions) onto [name]  Patient Self Care Activities:   Patient making dietary modifications  Working on controlling portion size of carbohydrates  Patient to take medications as prescribed  Attends all scheduled provider appointments  Patient to contact [name] about future scheduling for eye exam  Calls pharmacy for medication refills  Patient to check blood sugar regularly and keep log Date Fasting Blood Glucose  8 - June 213  9 - June 191  10 - June 195  11 - June 223   Patient to check blood pressure daily and keep log  Please see past updates related to this goal by clicking on the "Past Updates" button in the selected goal          Plan  Telephone follow up appointment with care management team member scheduled for: 6/25 at 1 pm  Harlow Asa, PharmD, [place] [phone]

## 2019-04-18 ENCOUNTER — Other Ambulatory Visit: Payer: Self-pay | Admitting: Pharmacy Technician

## 2019-04-18 ENCOUNTER — Ambulatory Visit: Payer: Self-pay | Admitting: Pharmacist

## 2019-04-18 DIAGNOSIS — E1136 Type 2 diabetes mellitus with diabetic cataract: Secondary | ICD-10-CM

## 2019-04-18 NOTE — Patient Outreach (Signed)
[facility] [facility]) Care Management  04/18/2019  Aaron Sosa June 30, 1941 [id number]    Care coordination call placed to [employer] in regards to patient's application for Jardiance.  Spoke to Aaron Sosa who said patient was temporarily approved. She informed patient needs to apply for LIS. She informed if patient is denied then they will need a copy of the LIS letter as well as if patient receives partial LIS. With the partial LIS patient will need to provide a copy of the copay of the medication either from the insurance company or from the pharmacy.  [name] informed a supply of medication was shipped out on 04/11/2019. She was unsure of the quantity that was sent but usually they send out a 90 days supply.  The tracking number for that shipment is [phone].  Will route note to embedded [facility] Harlow Asa to assist patient in applying for LIS.  [name] P. [name], [place] Management [phone]

## 2019-04-21 NOTE — Chronic Care Management (AMB) (Signed)
Chronic Care Management   Follow Up Note   04/21/2019 Name: Aaron Sosa MRN: [id number] DOB: 1941/04/07  Referred by: Olin Hauser, DO Reason for referral : Chronic Care Management (Patient Phone Call)   Aaron Sosa is a 78 y.o. year old male who is a primary care patient of Olin Hauser, DO. The CCM team was consulted for assistance with chronic disease management and care coordination needs.  Patient with a past medical history including but not limited to: hypertension, type 2 diabetes, hyperlipidemia, hearing loss of both ears, lower extremity edema and obesity.  Receive [employer] message from [place] letting me know that Aaron Sosa has been conditionally approved for patient assistance for Jardiance and the medication has been shipped out to the patient. Patient is required to apply for Extra Help from Social Security.   Outreach calls to both Aaron Sosa and his daugher, Aaron Sosa, regarding medication assistance  Review of patient status, including review of consultants reports, relevant laboratory and other test results, and collaboration with appropriate care team members and the patient's provider was performed as part of comprehensive patient evaluation and provision of chronic care management services.    Goals Addressed            This Visit's Progress   . "It is depressing to check my blood sugar"       Current Barriers:  . Financial . Limited hearing . Poor medication adherence - reluctance to take medication . Lack of knowledge about type 2 diabetes disease state  Pharmacist Clinical Goal: Over the next 30 days, patient will work with CM Pharmacist to address needs related to medication assistance and medication adherence  Interventions:  Collaborate with [name] to assist patient with patient assistance application for Jardiance made by [employer] message from [place] per [employer]	[place] patient assistance program representative, Mr. Colin has been conditionally approved for assistance for assistance for Jardiance and the medication has been shipped to the patient. Patient is required to apply for Extra Help from Social Security. If patient is denied or only approved for partial extra help, then they will need a copy of the letter, for Aaron Sosa to be able to continue to receive assistance from the program.  Let patient know about the conditional approval for patient assistance for Jardiance and the need to complete the Extra Help application. Patient denies having yet received the medicaiton in the mail  Outreach call to patient's daughter, Aaron Sosa, to advise her that patient received the conditional approval for patient assistance for Jardiance and the need to complete the Extra Help application. Explain to Aaron Sosa how to assist patient with completing the Extra Help through [facility], [facility].  Counsel patient on starting Jardiance, including administration, potential side effects and importance of staying hydrated  Inquire about patient's blood pressure and blood sugar. Aaron Sosa reports that his blood pressure has been "good" and his blood sugar this morning was around 195 mg/dL.  Patient Self Care Activities:   Patient making dietary modifications  Working on controlling portion size of carbohydrates  Patient to take medications as prescribed  Attends all scheduled provider appointments  Patient to contact [facility] about future scheduling for eye exam  Calls pharmacy for medication refills  Patient to check blood sugar regularly and keep log  Patient to check blood pressure daily and keep log  Please see past updates related to this goal by  clicking on the "Past Updates" button in the selected goal          Plan  Telephone follow up appointment with care management team member scheduled for: 6/25 at 1 pm  Duanne Moron,  PharmD, [facility] Clinical Pharmacist [facility] [employer] [phone]

## 2019-04-21 NOTE — Patient Instructions (Addendum)
Thank you allowing the Chronic Care Management Team to be a part of your care! It was a pleasure speaking with you today!     CCM (Chronic Care Management) Team    Janci Minor RN, BSN Nurse Care Coordinator  [phone]   Harlow Asa PharmD  Clinical Pharmacist  [phone]   Eula Fried LCSW Clinical Social Worker [phone]  Visit Information  Goals Addressed            This Visit's Progress   . "It is depressing to check my blood sugar"       Current Barriers:  . Financial . Limited hearing . Poor medication adherence - reluctance to take medication . Lack of knowledge about type 2 diabetes disease state  Pharmacist Clinical Goal: Over the next 30 days, patient will work with CM Pharmacist to address needs related to medication assistance and medication adherence  Interventions:  Collaborate with [name] to assist patient with patient assistance application for Jardiance made by [employer] message from [place] per [employer] patient assistance program representative, Mr. Tse has been conditionally approved for assistance for assistance for Jardiance and the medication has been shipped to the patient. Patient is required to apply for Extra Help from Social Security. If patient is denied or only approved for partial extra help, then they will need a copy of the letter, for Mr. Distler to be able to continue to receive assistance from the program.  Let patient know about the conditional approval for patient assistance for Jardiance and the need to complete the Extra Help application. Patient denies having yet received the medicaiton in the mail  Outreach call to patient's daughter, [name], to advise her that patient received the conditional approval for patient assistance for Jardiance and the need to complete the Extra Help application. Explain to [name] how to assist patient with completing the Extra Help  through [facility].  Counsel patient on starting Jardiance, including administration, potential side effects and importance of staying hydrated  Inquire about patient's blood pressure and blood sugar. Mr. Quiles reports that his blood pressure has been "good" and his blood sugar this morning was around 195 mg/dL.  Patient Self Care Activities:   Patient making dietary modifications  Working on controlling portion size of carbohydrates  Patient to take medications as prescribed  Attends all scheduled provider appointments  Patient to contact [facility] about future scheduling for eye exam  Calls pharmacy for medication refills  Patient to check blood sugar regularly and keep log  Patient to check blood pressure daily and keep log  Please see past updates related to this goal by clicking on the "Past Updates" button in the selected goal          The patient verbalized understanding of instructions provided today and declined a print copy of patient instruction materials.   Telephone follow up appointment with care management team member scheduled for: 6/25 at 1 pm  Harlow Asa, PharmD, [place] [phone]

## 2019-04-23 ENCOUNTER — Other Ambulatory Visit: Payer: Self-pay | Admitting: Pharmacy Technician

## 2019-04-23 NOTE — Patient Outreach (Signed)
[place] [facility] [facility]) Care Management  04/23/2019  Dillian Feig Seipp 1941/10/14 [id number]  Successful outreach call placed to patient in regards to [facility] application for Jardiance.  Spoke to patient, HIPAA identifiers verified.  Patient informed he received 90 days supply of Jardiance. Patient also informed that his daughter had helped him apply for LIS. Informed patient to make sure he kept the copy of the LIS letter because we would need a copy of that letter to extend his enrollment. Patient verbalized understanding. Patient informed he would call either myself or embedded [place] when he receives the letter.  Will route note to [place].  Rhyatt Muska P. Jace Fermin, [place] Management [phone]

## 2019-04-24 ENCOUNTER — Ambulatory Visit: Payer: Self-pay | Admitting: Pharmacist

## 2019-04-24 DIAGNOSIS — E1136 Type 2 diabetes mellitus with diabetic cataract: Secondary | ICD-10-CM | POA: Diagnosis not present

## 2019-04-24 DIAGNOSIS — I10 Essential (primary) hypertension: Secondary | ICD-10-CM | POA: Diagnosis not present

## 2019-04-24 NOTE — Patient Instructions (Signed)
Thank you allowing the Chronic Care Management Team to be a part of your care! It was a pleasure speaking with you today!     CCM (Chronic Care Management) Team    Janci Minor RN, BSN Nurse Care Coordinator  [phone]   Harlow Asa PharmD  Clinical Pharmacist  [phone]   Eula Fried LCSW Clinical Social Worker [phone]  Visit Information  Goals Addressed            This Visit's Progress   . "It is depressing to check my blood sugar"       Current Barriers:  . Financial . Limited hearing . Poor medication adherence - reluctance to take medication . Lack of knowledge about type 2 diabetes disease state  Pharmacist Clinical Goal: Over the next 30 days, patient will work with CM Pharmacist to address needs related to diabetes management and medication adherence  Interventions:  Collaborate with [name] to assist patient with patient assistance application for Jardiance made by [place] message from [facility] CPhT Susy Frizzle - Mr. Rietz received a 90 day supply of Jardiance from the patient assistance program and confirmed that he submitted the Extra Help application with his daughter.  Counsel patient again regarding Jardiance, including administration, potential side effects and importance of staying hydrated   Mr. Shuman reports started Jardiance 10 mg once daily yesterday when he received it  Counsel patient regarding symptoms of low blood sugar and that patient might feel these symptoms with start of Jardiance due to the relative change in average blood sugar. Discuss how to manage potential symptoms. Patient verbalizes understanding.  Inquire about patient's blood pressure and blood sugar. Mr. Etchison reports the following blood pressures from the past week, but denies writing down dates for the readings: 122/68, HR 75; 134/73, HR 74; 133/73, HR 74  Ask Mr. Minion to start recording dates with his  readings  Patient states that he scheduled his eye exam for 7/22  Counsel patient regarding importance of safety with current COVID-19 pandemic. Patient confirms that he has a facemask that he will wear, including to his upcoming eye exam appointment  Patient Self Care Activities:   Patient making dietary modifications  Working on controlling portion size of carbohydrates  Patient to take medications as prescribed  Attends all scheduled provider appointments  Appointment for eye exam scheduled with [facility] for 7/22  Calls pharmacy for medication refills  Patient to check blood sugar regularly and keep log Date Fasting Blood Glucose Notes  18 - June 193   19 - June 195   20 - June 208   21 - June 197   22 - June 230   23 - June 183   24 - June 193 *Started Jardiance 10 mg once daily*  25 - June 156      Patient to check blood pressure daily and keep log  Patient to check mail for response from [employer] regarding extra help application, retain letter when received and notify CM Pharmacist/[name]  Please see past updates related to this goal by clicking on the "Past Updates" button in the selected goal          The patient verbalized understanding of instructions provided today and declined a print copy of patient instruction materials.   Telephone follow up appointment with care management team member scheduled for: 7/2 at 1:30 pm  Harlow Asa, PharmD, [facility] [phone]

## 2019-04-24 NOTE — Chronic Care Management (AMB) (Signed)
Chronic Care Management   Follow Up Note   04/24/2019 Name: Aaron Sosa MRN: [id number] DOB: Nov 13, 1940  Referred by: Aaron Cords, DO Reason for referral : Chronic Care Management (Patient Phone Call)   Aaron Sosa is a 78 y.o. year old male who is a primary care patient of Aaron Cords, DO. The CCM team was consulted for assistance with chronic disease management and care coordination needs.  Patient with a past medical history including but not limited to: hypertension, type 2 diabetes, hyperlipidemia, hearing loss of both ears, lower extremity edema and obesity.  I reached out to Aaron Sosa by phone today to follow up regarding medication adherence, medication assistance and diabetes education.  Review of patient status, including review of consultants reports, relevant laboratory and other test results, and collaboration with appropriate care team members and the patient's provider was performed as part of comprehensive patient evaluation and provision of chronic care management services.    Goals Addressed            This Visit's Progress   . "It is depressing to check my blood sugar"       Current Barriers:  . Financial . Limited hearing . Poor medication adherence - reluctance to take medication . Lack of knowledge about type 2 diabetes disease state  Pharmacist Clinical Goal: Over the next 30 days, patient will work with CM Pharmacist to address needs related to diabetes management and medication adherence  Interventions:  Collaborate with Aaron Sosa to assist patient with patient assistance application for Jardiance made by Aaron Sosa - Completed  Receive [employer] message from Aaron Sosa Sosa Aaron Sosa - Aaron Sosa received a 90 day supply of Jardiance from the patient assistance program and confirmed that he submitted the Extra Help application with his daughter.  Counsel patient again regarding Jardiance, including administration,  potential side effects and importance of staying hydrated   Aaron Sosa reports started Jardiance 10 mg once daily yesterday when he received it  Counsel patient regarding symptoms of low blood sugar and that patient might feel these symptoms with start of Jardiance due to the relative change in average blood sugar. Discuss how to manage symptoms. Patient verbalizes understanding.  Inquire about patient's blood pressure and blood sugar. Aaron Sosa reports the following blood pressures from the past week, but denies writing down dates for the readings: 122/68, HR 75; 134/73, HR 74; 133/73, HR 74  Ask Aaron Sosa to start recording dates with his readings  Patient excited to tell me that he has scheduled his eye exam for 7/22  Counsel patient regarding importance of safety with current COVID-19 pandemic. Patient confirms that he has a facemask that he will wear, including to his upcoming eye exam appointment  Patient Self Care Activities:   Patient making dietary modifications  Working on controlling portion size of carbohydrates  Patient to take medications as prescribed  Attends all scheduled provider appointments  Appointment for eye exam scheduled with [facility] for 7/22  Calls pharmacy for medication refills  Patient to check blood sugar regularly and keep log Date Fasting Blood Glucose Notes  18 - June 193   19 - June 195   20 - June 208   21 - June 197   22 - June 230   23 - June 183   24 - June 193 *Started Jardiance 10 mg once daily*  25 - June 156     Patient to check blood pressure daily  and keep log  Patient to check mail for response from [employer] regarding extra help application, retain letter when received and notify CM Pharmacist/Aaron Sosa  Please see past updates related to this goal by clicking on the "Past Updates" button in the selected goal          Plan  Telephone follow up appointment with care management team member scheduled  for: 7/2 at 1:30 pm  Aaron Sosa, PharmD, [place] [phone]

## 2019-05-01 ENCOUNTER — Ambulatory Visit: Payer: Self-pay | Admitting: Licensed Clinical Social Worker

## 2019-05-01 ENCOUNTER — Ambulatory Visit: Payer: PPO | Admitting: Pharmacist

## 2019-05-01 ENCOUNTER — Telehealth: Payer: Self-pay

## 2019-05-01 DIAGNOSIS — E1136 Type 2 diabetes mellitus with diabetic cataract: Secondary | ICD-10-CM

## 2019-05-01 DIAGNOSIS — I10 Essential (primary) hypertension: Secondary | ICD-10-CM

## 2019-05-01 NOTE — Chronic Care Management (AMB) (Signed)
Chronic Care Management   Follow Up Note   05/01/2019 Name: Aaron Sosa MRN: [id number] DOB: [birth year]  Referred by: Aaron Hauser, DO Reason for referral : Chronic Care Management (Patient Phone Call)   Aaron Sosa is a 78 y.o. year old male who is a primary care patient of Aaron Hauser, DO. The CCM team was consulted for assistance with chronic disease management and care coordination needs.  Patient with a past medical history including but not limited to: hypertension, type 2 diabetes, hyperlipidemia, hearing loss of both ears, lower extremity edema and obesity.  I reached out to [place] by phone today to follow up regarding medication adherence, medication assistance and diabetes education.  Review of patient status, including review of consultants reports, relevant laboratory and other test results, and collaboration with appropriate care team members and the patient's provider was performed as part of comprehensive patient evaluation and provision of chronic care management services.    Goals Addressed            This Visit's Progress   . "It is depressing to check my blood sugar"       Current Barriers:  . Financial . Limited hearing . Poor medication adherence - reluctance to take medication . Lack of knowledge about type 2 diabetes disease state  Pharmacist Clinical Goal: Over the next 30 days, patient will work with CM Pharmacist to address needs related to diabetes management and medication adherence  Interventions:  Collaborate with [name] to assist patient with patient assistance application for Jardiance made by [employer] - conditional approval received  Patient needs denial letter or evidence of partial extra help to continue to receive assistance from [employer]   Aaron Sosa reports that he received a letter back from [employer] today indicating that he has previously applied for extra help and  provided a decision on this application  Collaborate with RPh with patient's health plan, [employer], for patient's extra help status  Per Aaron Sosa, patient as partial extra help, level 4. His current copayment for Jardiance would be his tier 3 copayment  Will collaborate with PCP/patient's pharmacy for documentation of patient's copayment  Follow up with patient's daughter, Aaron Sosa, regarding patient's extra help status and next steps for meeting requirements of patient assistance program   Review with Aaron Sosa his recent blood sugar results  Counsel patient about the continued importance of medication adherence and dietary adherence  Aaron Sosa expresses interest in having tighter blood sugar control in the future  Note patient has upcoming visit with PCP on 7/13. Advise patient to discuss further with provider at that time  Review with Aaron Sosa his recent blood pressure results  Aaron Sosa reports having arthritis symptoms in joints - Advise patient to follow up with PCP regarding this pain at his upcoming appointment.  Patient Self Care Activities:   Patient making dietary modifications  Working on controlling portion size of carbohydrates  Patient to take medications as prescribed  Attends all scheduled provider appointments  Appointment for eye exam scheduled with [facility] for 7/22  Calls pharmacy for medication refills  Patient to check blood sugar regularly and keep log Date Fasting Blood Glucose  26- June -  27 - June 143  28 - June 153  29 - June 137  30 - June 150  1 - July 130  2 - July 144  Average 143    Patient to check blood pressure daily and  keep log Date Notes  26- June 112/69, HR 87  27 - June   28 - June   29 - June 122/66, HR 71  30 - June   1 - July   2 - July 122/65, GD92    Please see past updates related to this goal by clicking on the "Past Updates" button in the selected goal          Plan  Telephone  follow up appointment with care management team member scheduled for: 7/16 at 2 pm  Aaron Sosa, PharmD, [facility] Clinical Pharmacist [facility] [phone]

## 2019-05-01 NOTE — Patient Instructions (Signed)
Thank you allowing the Chronic Care Management Team to be a part of your care! It was a pleasure speaking with you today!     CCM (Chronic Care Management) Team    Janci Minor RN, BSN Nurse Care Coordinator  [phone]   Duanne Moron PharmD  Clinical Pharmacist  [phone]   Dickie La LCSW Clinical Social Worker [phone]  Visit Information  Goals Addressed            This Visit's Progress   . "It is depressing to check my blood sugar"       Current Barriers:  . Financial . Limited hearing . Poor medication adherence - reluctance to take medication . Lack of knowledge about type 2 diabetes disease state  Pharmacist Clinical Goal: Over the next 30 days, patient will work with CM Pharmacist to address needs related to diabetes management and medication adherence  Interventions:  Collaborate with [name] to assist patient with patient assistance application for Jardiance made by [employer] - conditional approval received  Patient needs denial letter or evidence of partial extra help to continue to receive assistance from [employer]   Mr. Felgar reports that he received a letter back from [employer] today indicating that he has previously applied for extra help and provided a decision at that time  Collaborate with RPh with patient's health plan, [employer], for patient's extra help status  Per Deanna, patient as partial extra help, level 4. His current copayment for Jardiance would be his tier 3 copayment  Will collaborate with PCP/patient's pharmacy for documentation of patient's copayment  Follow up with patient's daughter, Dewayne Hatch, regarding patient's extra help status and next steps for meeting requirements of patient assistance program   Review with Mr. Roh his recent blood sugar results  Counsel patient about the continued importance of medication adherence and dietary adherence  Mr. Henckel expresses  interest in having tighter blood sugar control in the future  Note patient has upcoming visit with PCP on 7/13. Advise patient to discuss further with provider at that time  Review with Mr. Venhaus his recent blood pressure results  Mr. Milhoan reports having arthritis symptoms in joints - Advise patient to follow up with PCP regarding this pain at his upcoming appointment.  Patient Self Care Activities:   Patient making dietary modifications  Working on controlling portion size of carbohydrates  Patient to take medications as prescribed  Attends all scheduled provider appointments  Appointment for eye exam scheduled with [facility] for 7/22  Calls pharmacy for medication refills  Patient to check blood sugar regularly and keep log Date Fasting Blood Glucose  26- June -  27 - June 143  28 - June 153  29 - June 137  30 - June 150  1 - July 130  2 - July 144  Average 143    Patient to check blood pressure daily and keep log Date Notes  26- June 112/69, HR 87  27 - June   28 - June   29 - June 122/66, HR 71  30 - June   1 - July   2 - July 122/65, VU02    Please see past updates related to this goal by clicking on the "Past Updates" button in the selected goal          The patient verbalized understanding of instructions provided today and declined a print copy of patient instruction materials.   Telephone follow up appointment with care  management team member scheduled for: 7/16 at 2 pm  Harlow Asa, PharmD, [place] [phone]

## 2019-05-01 NOTE — Chronic Care Management (AMB) (Signed)
  Chronic Care Management    Clinical Social Work Follow Up Note  05/01/2019 Name: TED GOODNER MRN: [id number] DOB: 1941/05/13  Johnney Ou Mortellaro is a 78 y.o. year old male who is a primary care patient of Olin Hauser, DO. The CCM team was consulted for assistance with Mental Health Counseling and Resources and Grief Counseling.   Review of patient status, including review of consultants reports, other relevant assessments, and collaboration with appropriate care team members and the patient's provider was performed as part of comprehensive patient evaluation and provision of chronic care management services.     LCSW completed CCM outreach attempt today but was unable to reach patient successfully. A HIPPA compliant voice message was left encouraging patient to return call once available. LCSW rescheduled CCM SW appointment as well.  Follow Up Plan: SW will follow up with patient by phone over the next 30 days  Eula Fried, [employer], MSW, [email or web address] Phone: [phone]

## 2019-05-06 ENCOUNTER — Other Ambulatory Visit: Payer: Self-pay | Admitting: Pharmacy Technician

## 2019-05-06 NOTE — Patient Outreach (Signed)
[facility] Care Management  05/06/2019  Kayson Tasker Delisi 11/01/1940 [id number]    Received letter from patient's insurance company stating patient has LIS level 4 and the copay of the medication.  Faxed letter to [facility] to inquire if patient's enrollment can be extended.  Will followup with BI in 5-10 business days,  Benoit Meech P. [place], [place] Management [phone]

## 2019-05-12 ENCOUNTER — Ambulatory Visit (INDEPENDENT_AMBULATORY_CARE_PROVIDER_SITE_OTHER): Payer: PPO | Admitting: Family Medicine

## 2019-05-12 ENCOUNTER — Other Ambulatory Visit: Payer: Self-pay

## 2019-05-12 ENCOUNTER — Encounter: Payer: Self-pay | Admitting: Family Medicine

## 2019-05-12 ENCOUNTER — Ambulatory Visit: Payer: Self-pay | Admitting: Pharmacist

## 2019-05-12 ENCOUNTER — Other Ambulatory Visit: Payer: Self-pay | Admitting: Pharmacy Technician

## 2019-05-12 DIAGNOSIS — E1136 Type 2 diabetes mellitus with diabetic cataract: Secondary | ICD-10-CM

## 2019-05-12 MED ORDER — METFORMIN HCL ER 500 MG PO TB24: 500.0000 mg | ORAL_TABLET | Freq: Every day | ORAL | 1 refills | Status: DC

## 2019-05-12 MED ORDER — METFORMIN HCL 500 MG PO TABS: 500.0000 mg | ORAL_TABLET | Freq: Two times a day (BID) | ORAL | 1 refills | Status: DC

## 2019-05-12 NOTE — Patient Outreach (Signed)
[facility]) Care Management  05/12/2019  Aaron Sosa Aaron Sosa, Aaron Sosa [id number]  Care coordination call placed to [facility] in regards to patient's Jardiance application.  Spoke to [place] who informed they had received the letter stating patient has LIS level 4. However in the letter it does not specify what the tiered copay is. [name] informed in order for the renewal to be extended patient would need to provide the out of pocket cost of the medication by sending in a prinout from the pharmacy.  Will route note to Aaron Sosa with assistance in obtaining that information.  Marisal Swarey P. Reyna Lorenzi, [place] Management [phone]

## 2019-05-12 NOTE — Chronic Care Management (AMB) (Signed)
Chronic Care Management   Follow Up Note   05/12/2019 Name: Aaron Aaron Sosa MRN: [id number] DOB: [birth year]  Referred by: Aaron Cords, DO Reason for referral : Chronic Care Management (Patient Phone Call)   Aaron Aaron Sosa is a 78 y.o. year old male who is a primary care patient of Aaron Cords, DO. The CCM team was consulted for assistance with chronic disease management and care coordination needs.  Patient with a past medical history including but not limited to: hypertension, type 2 diabetes, hyperlipidemia, hearing loss of both ears, lower extremity edema and obesity.  Receive In[employer]asket message from PCP letting me know that Aaron Aaron Sosa is intolerant to Jardiance due to joint pain. Patient to stop Jardiance and retry metformin therapy, metformin 500 mg twice daily.  Place coordination of care call with [facility] regarding metformin product availability  Coordinate care with PCP regarding patient's diabetes medication managment  I reached out to Aaron Aaron Sosa by phone today.   Review of patient status, including review of consultants reports, relevant laboratory and other test results, and collaboration with appropriate care team members and the patient's provider was performed as part of comprehensive patient evaluation and provision of chronic care management services.    Goals Addressed            This Visit's Progress   . "It is depressing to check my blood sugar"       Current Barriers:  . Financial . Limited hearing . Poor medication adherence - reluctance to take medication . Lack of knowledge about type 2 diabetes disease state  Pharmacist Clinical Goal: Over the next 30 days, patient will work with CM Pharmacist to address needs related to diabetes management and medication adherence  Interventions:  Collaborate with Aaron Aaron Sosa  Receive Aaron Aaron Sosa message from PCP advising that Aaron Aaron Sosa is intolerant to Jardiance due to joint  pain. Patient to retry metformin therapy, metformin 500 mg twice daily.  Particularly given patient's previous intolerance to metformin due to stomach side effects, recommend prescription change to the extended release formulation. Also, recommend that patient start with 1 tablet once daily with breakfast for 1-2 weeks before increasing up to twice daily dosing.  Coordination of care call to [facility] to confirm pharmacy currently able to stock metformin ER 500 mg, despite the current recall of this product from some manufacturers. RPh confirms that the pharmacy currently has been able to stock metformin ER that has not been part of the recall.  Follow up with Aaron Aaron Sosa regarding his diabetes medication management  Aaron Aaron Sosa how he feels like his current joint pains are different from his arthritis pain.   Reports that he took his last dose of Jardiance this morning  Reports his blood sugar this morning was 150 mg/dL  Counsel patient regarding importance of slow titration and consistent adherence for tolerance of stomach side effects of metformin  Patient to start with 1 tablet once daily with breakfast for 1-2 weeks before increasing up to twice daily dosing (starting tomorrow)  Reports that he has picked up the metformin IR 500 mg prescription from his pharmacy.   Counsel patient that his PCP has called in an updated prescription for the ER form that he can take in place of the immediate release form to improve tolerability.  Encourage patient to maintain dietary changes that he has made to improve his blood sugar  Patient Self Care Activities:   Patient making dietary modifications  Working  on controlling portion size of carbohydrates  Patient to take medications as prescribed  Attends all scheduled provider appointments  Appointment for eye exam scheduled with Aaron Aaron Sosa for 7/22  Calls pharmacy for medication refills  Patient to check blood  sugar regularly and keep log  Patient to check blood pressure daily and keep log   Please see past updates related to this goal by clicking on the "Past Updates" button in the selected goal          Plan Telephone follow up appointment with CM Pharmacist scheduled for: 7/16 at 2 pm  Aaron Aaron Sosa, PharmD, [place] [phone]

## 2019-05-12 NOTE — Progress Notes (Signed)
Virtual Visit via Telephone The purpose of this virtual visit is to provide medical care while limiting exposure to the novel coronavirus (COVID19) for both patient and office staff.  Consent was obtained for phone visit:  Yes.   Answered questions that patient had about telehealth interaction:  Yes.   I discussed the limitations, risks, security and privacy concerns of performing an evaluation and management service by telephone. I also discussed with the patient that there may be a patient responsible charge related to this service. The patient expressed understanding and agreed to proceed.  Patient Location: Home Provider Location: [facility])  ---------------------------------------------------------------------- Chief Complaint  Patient presents with  . Diabetes    as per patient JARDIANCE is giving him a joint pain wants alterante wants to go back on TRULICITY also refused depression screenig    S: Reviewed CMA documentation. I have called patient and gathered additional HPI as follows:  CHRONIC DM, Type 2 - Last visit with me 03/12/19, for last visit same problem, treated with taken off Trulicity and working with [facility] to determine coverage from manufacturer for financial asst on meds, see prior notes for background information. - Interval update with has initiated Jardiance SGLT2 after DC'd off Trulicity. - Today patient reports now he has side effect on Jardiance and has discontinued this. He has self discontinued both Trulicity and Jardiance now. Jardiance due to pain in his joints, and Trulicity was causing pain affecting his muscles. He says he did best on metformin the past even though he had some diarrhea issues. - Last A1c on file March 2020 was 11.8, previously >12 - He has failed in past Ozempic, Trulicity, Jardiance, also Statin and BP med in past also due to side effects - He continues to follow by phone with Duanne Moron Navos for Chronic  Care Management team - he has significant financial barriers and med adherence issues based on chart review - he is not followed by Endocrinology CBGs:improved cbg checking, avg ranges 140-150 based on his latest report Meds: NONE currently - self discontinued Jardiance Currently on ACEi Lifestyle: - Diet (Still trying to improve diet) - Exercise (walking regularly 6-7k daily at home) Denies hypoglycemia, polyuria, visual changes, numbness or tingling.   Past Medical History:  Diagnosis Date  . Arthritis   . HOH (hard of hearing)   . Lower extremity edema    Social History   Tobacco Use  . Smoking status: Former Smoker    Packs/day: 2.00    Years: 5.00    Pack years: 10.00    Types: Cigarettes  . Smokeless tobacco: Former Engineer, water Use Topics  . Alcohol use: Yes    Alcohol/week: 1.0 standard drinks    Types: 1 Cans of beer per week  . Drug use: Never    Current Outpatient Medications:  .  acetaminophen (TYLENOL) 325 MG tablet, Take by mouth., Disp: , Rfl:  .  lisinopril-hydrochlorothiazide (ZESTORETIC) 20-12.5 MG tablet, Take 1 tablet by mouth daily., Disp: 90 tablet, Rfl: 1 .  magnesium 30 MG tablet, Take 30 mg by mouth once., Disp: , Rfl:  .  Multiple Vitamin (MULTIVITAMIN) capsule, Take 1 capsule by mouth daily., Disp: , Rfl:  .  metFORMIN (GLUCOPHAGE-XR) 500 MG 24 hr tablet, Take 1 tablet (500 mg total) by mouth daily with breakfast., Disp: 90 tablet, Rfl: 1  Depression screen [facility] 2/9 09/24/2018 08/14/2018  Decreased Interest 0 0  Down, Depressed, Hopeless 0 0  PHQ - 2 Score  0 0    GAD 7 : Generalized Anxiety Score 03/12/2019  Nervous, Anxious, on Edge (No Data)    -------------------------------------------------------------------------- O: No physical exam performed due to remote telephone encounter.  Lab results reviewed.  No results found for this or any previous visit (from the past 2160 hour(s)).  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 Restart Metformin, Switch back to Metformin XR 500mg  daily, may titrate up gradually to avoid GI intolerance, initially sent IR but switched back to XR for better GI tolerance 2. Remain off Jardiance, Trulicity 3. Encourage improved lifestyle - low carb, low sugar diet, reduce portion size, continue improving regular exercise  Continue ACEi - failed statin due to myalgia - discontinued  Unable to schedule DM Eye due to cost/coverage, advised importance - he will reconsider again and try to schedule  Follow-up with [facility] in future, keep working on med adherence  Future consider DPP4 Januvia as option to add to Metformin if needed  Also advised that I am concerned if his A1c does not improve he may warrant Endocrinology consultation in future, defer for now      Relevant Medications   metFORMIN (GLUCOPHAGE-XR) 500 MG 24 hr tablet      Meds ordered this encounter  Medications  . DISCONTD: metFORMIN (GLUCOPHAGE) 500 MG tablet    Sig: Take 1 tablet (500 mg total) by mouth 2 (two) times daily with a meal.    Dispense:  180 tablet    Refill:  1  . metFORMIN (GLUCOPHAGE-XR) 500 MG 24 hr tablet    Sig: Take 1 tablet (500 mg total) by mouth daily with breakfast.     Dispense:  90 tablet    Refill:  1    Change from Metformin IR 500 over to ER 500mg  if available    Follow-up: - Return in 3 months for Diabetes  Patient verbalizes understanding with the above medical recommendations including the limitation of remote medical advice.  Specific follow-up and call-back criteria were given for patient to follow-up or seek medical care more urgently if needed.  - Time spent in direct consultation with patient on phone: 11 minutes  Nobie Putnam, [facility] 05/12/2019, 11:38 AM

## 2019-05-12 NOTE — Patient Instructions (Addendum)
Stop jardiance if having side effect.  Switch back to Metformin 1 pill twice a day - in future can increase to 2 pills twice a day. Need to take with food to reduce symptoms of diarrhea upset stomach.  As discussed, my recommendation is to refer you to Endocrinologist Diabetes specialist. I am concerned your blood sugar will not be well enough controlled alone on Metformin.  Please schedule a Follow-up Appointment to: Return in about 3 months (around 08/12/2019) for DM A1c.  If you have any other questions or concerns, please feel free to call the office or send a message through [place]. You may also schedule an earlier appointment if necessary.  Additionally, you may be receiving a survey about your experience at our office within a few days to 1 week by e-mail or mail. We value your feedback.  Nobie Putnam, DO [place]

## 2019-05-12 NOTE — Patient Instructions (Signed)
Thank you allowing the Chronic Care Management Team to be a part of your care! It was a pleasure speaking with you today!     CCM (Chronic Care Management) Team    Janci Minor RN, BSN Nurse Care Coordinator  [phone]   Aaron Sosa PharmD  Clinical Pharmacist  [phone]   Eula Fried LCSW Clinical Social Worker [phone]  Visit Information  Goals Addressed            This Visit's Progress   . "It is depressing to check my blood sugar"       Current Barriers:  . Financial . Limited hearing . Poor medication adherence - reluctance to take medication . Lack of knowledge about type 2 diabetes disease state  Pharmacist Clinical Goal: Over the next 30 days, patient will work with CM Pharmacist to address needs related to diabetes management and medication adherence  Interventions:  Collaborate with Dr. Parks Ranger  Receive [facility] message from PCP advising that Aaron Sosa is intolerant to Jardiance due to joint pain. Patient to retry metformin therapy, metformin 500 mg twice daily.  Particularly given patient's previous intolerance to metformin due to stomach side effects, recommend prescription change to the extended release formulation. Also, recommend patient start with 1 tablet once daily with breakfast for 1-2 weeks before increasing up to twice daily dosing.  Coordination of care call to [facility] to confirm pharmacy currently able to stock metformin ER 500 mg, despite the current recall of this product from some manufacturers. RPh confirms that the pharmacy currently has been able to stock metformin ER that has not been part of the recall.  Follow up with Aaron Sosa regarding his diabetes medication management  Aaron Sosa describes how he feels like his current joint pains are different from his arthritis pain.   Reports that he took his last dose of Jardiance this morning  Reports his blood sugar this morning was 150  mg/dL  Counsel patient regarding importance of slow titration and consistent adherence for tolerance of stomach side effects of metformin  Patient to start with 1 tablet once daily with breakfast for 1-2 weeks before increasing up to twice daily dosing (starting tomorrow)  Reports that he has picked up the metformin IR 500 mg prescription from his pharmacy.   Counsel patient that his PCP has called in an updated prescription for the ER form that he can take in place of the immediate release form to improve tolerability.  Encourage patient to maintain dietary changes that he has made to improve his blood sugar  Patient Self Care Activities:   Patient making dietary modifications  Working on controlling portion size of carbohydrates  Patient to take medications as prescribed  Attends all scheduled provider appointments  Appointment for eye exam scheduled with [facility] for 7/22  Calls pharmacy for medication refills  Patient to check blood sugar regularly and keep log  Patient to check blood pressure daily and keep log   Please see past updates related to this goal by clicking on the "Past Updates" button in the selected goal          The patient verbalized understanding of instructions provided today and declined a print copy of patient instruction materials.   Telephone follow up appointment with care management team member scheduled for: 7/16  Aaron Sosa, PharmD, [place] [employer] [phone]

## 2019-05-12 NOTE — Assessment & Plan Note (Signed)
Uncontrolled DM previous A1c 11.8, overdue for repeat Failed jardiance due to side effect Overall has shown improved CBG control and improved med adherence No hypoglycemia Complications - hyperglycemia, cataracts bilateral (s/p surgery), other including hyperlipidemia specifically obesity - increases risk of future cardiovascular complications  Failed Metformin (GI intolerance), Glipizide (side effect), Ozempic (unable to use), Trulicity (in thigh, muscle ache and weak), Jardiance (joint ache)  Plan:  1. Restart Metformin, Switch back to Metformin XR 500mg  daily, may titrate up gradually to avoid GI intolerance, initially sent IR but switched back to XR for better GI tolerance 2. Remain off Jardiance, Trulicity 3. Encourage improved lifestyle - low carb, low sugar diet, reduce portion size, continue improving regular exercise  Continue ACEi - failed statin due to myalgia - discontinued  Unable to schedule DM Eye due to cost/coverage, advised importance - he will reconsider again and try to schedule  Follow-up with [facility] in future, keep working on med adherence  Future consider DPP4 Januvia as option to add to Metformin if needed  Also advised that I am concerned if his A1c does not improve he may warrant Endocrinology consultation in future, defer for now

## 2019-05-13 ENCOUNTER — Other Ambulatory Visit: Payer: Self-pay | Admitting: Pharmacy Technician

## 2019-05-13 NOTE — Patient Outreach (Signed)
[name]) Care Management  05/13/2019  Aaron Sosa 06-10-1941 [id number]    Received inbasket message from embedded [facility] RPh Harlow Asa that patient's Jaridance through [name] patient assistance has been discontinued.  Will remove myself from care team.  [name], [place] Management [phone]

## 2019-05-15 ENCOUNTER — Ambulatory Visit: Payer: Self-pay | Admitting: Pharmacist

## 2019-05-15 DIAGNOSIS — I10 Essential (primary) hypertension: Secondary | ICD-10-CM

## 2019-05-15 DIAGNOSIS — E1136 Type 2 diabetes mellitus with diabetic cataract: Secondary | ICD-10-CM

## 2019-05-15 NOTE — Patient Instructions (Signed)
Thank you allowing the Chronic Care Management Team to be a part of your care! It was a pleasure speaking with you today!     CCM (Chronic Care Management) Team    Janci Minor RN, BSN Nurse Care Coordinator  [phone]   Harlow Asa PharmD  Clinical Pharmacist  [phone]   Eula Fried LCSW Clinical Social Worker [phone]  Visit Information  Goals Addressed            This Visit's Progress   . "It is depressing to check my blood sugar"       Current Barriers:  . Financial . Limited hearing . Poor medication adherence - reluctance to take medication . Lack of knowledge about type 2 diabetes disease state  Pharmacist Clinical Goal: Over the next 30 days, patient will work with CM Pharmacist to address needs related to diabetes management and medication adherence  Interventions:  Follow up with [name] regarding his diabetes medication management  Patient reports that he has been taking his metformin 500 mg once daily with breakfast as directed since 7/14  Reports has had some stomach upset with starting the metformin, but has been tolerable and denies having felt the need to pick up the extended release prescription as this time.  Counsel on importance of staying hydrated  Counsel patient regarding importance of slow titration and consistent adherence for tolerance of stomach side effects of metformin  Patient verbalizes understanding and states that he is planning to increase to twice daily (taking the second dose with supper) next week  Follow up with patient regarding his blood pressure control (see numbers below)  Counsel on importance of medication adherence. [name] denies any missed doses.  Address patient's questions regarding obtaining a flu shot in the Fall. Encourage patient to plan to receive the flu vaccine.  Confirm with [name] Sosa his plan to attend his upcoming appointment with his eye doctor on 7/22.   Counsel patient  regarding continued importance of safety with current COVID-19 pandemic.  Patient Self Care Activities:   Patient making dietary modifications  Continuing to work on controlling portion size of carbohydrates  Patient to take medications as prescribed  Attends all scheduled provider appointments  Appointment for eye exam scheduled with [facility] for 7/22  Calls pharmacy for medication refills  Patient to check blood sugar regularly and keep log Date Fasting Blood Glucose Notes  10 - July 170   11 - July 140   12 - July 144   13 - July 150 *Last day of Jardiance  14 - July 148 *First day of metformin - 500 mg daily with breakfast  15 - July 143   16 - July 161   Average 151     Patient to check blood pressure daily and keep log Date Blood Pressure  10 - July   11 - July   12 - July   13 - July 103/64, HR 79  14 - July   15 - July   16 - July 139/67, HR 74    Please see past updates related to this goal by clicking on the "Past Updates" button in the selected goal          The patient verbalized understanding of instructions provided today and declined a print copy of patient instruction materials.   Telephone follow up appointment with care management team member scheduled for: 7/21 at 2 pm  Harlow Asa, PharmD, [place]  [employer] [phone]

## 2019-05-15 NOTE — Chronic Care Management (AMB) (Signed)
Chronic Care Management   Follow Up Note   05/15/2019 Name: Aaron Sosa MRN: [id number] DOB: 04/07/1941  Referred by: [name], DO Reason for referral : Chronic Care Management (Patient Phone Call)   Aaron Sosa is a 78 y.o. year old male who is a primary care patient of [name], DO. The CCM team was consulted for assistance with chronic disease management and care coordination needs. Patient with a past medical history including but not limited to: hypertension, type 2 diabetes, hyperlipidemia, hearing loss of both ears, lower extremity edema and obesity.  I reached out to Aaron Sosa by phone today.   Review of patient status, including review of consultants reports, relevant laboratory and other test results, and collaboration with appropriate care team members and the patient's provider was performed as part of comprehensive patient evaluation and provision of chronic care management services.    Goals Addressed            This Visit's Progress    "It is depressing to check my blood sugar"       Current Barriers:   Financial  Limited hearing  Poor medication adherence - reluctance to take medication  Lack of knowledge about type 2 diabetes disease state  Pharmacist Clinical Goal: Over the next 30 days, patient will work with CM Pharmacist to address needs related to diabetes management and medication adherence  Interventions:  Follow up with Aaron Sosa regarding his diabetes medication management  Patient reports that he has been taking his metformin 500 mg once daily with breakfast as directed since 7/14  Reports has had some stomach upset with starting the metformin, but has been tolerable and denies having felt the need to pick up the extended release prescription as this time.  Counsel on importance of staying hydrated  Counsel patient regarding importance of slow titration and consistent adherence for tolerance of stomach  side effects of metformin  Patient verbalizes understanding and states that he is planning to increase to twice daily (taking the second dose with supper) next week  Follow up with patient regarding his blood pressure control (see numbers below)  Counsel on importance of medication adherence. Aaron Sosa denies any missed doses.  Address patient's questions regarding obtaining a flu shot in the Fall. Encourage patient to plan to receive the flu vaccine.  Confirm with Aaron Sosa his plan to attend his upcoming appointment with his eye doctor on 7/22.   Counsel patient regarding continued importance of safety with current COVID-19 pandemic.  Patient Self Care Activities:   Patient making dietary modifications  Continuing to work on controlling portion size of carbohydrates  Patient to take medications as prescribed  Attends all scheduled provider appointments  Appointment for eye exam scheduled with [name] for 7/22  Calls pharmacy for medication refills  Patient to check blood sugar regularly and keep log Date Fasting Blood Glucose Notes  10 - July 170   11 - July 140   12 - July 144   13 - July 150 *Last day of Jardiance  14 - July 148 *First day of metformin - 500 mg daily with breakfast  15 - July 143   16 - July 161   Average 151     Patient to check blood pressure daily and keep log Date Blood Pressure  10 - July   11 - July   12 - July   13 - July 103/64, HR 79  14 - July  15 - July   16 - July 139/67, HR 74    Please see past updates related to this goal by clicking on the "Past Updates" button in the selected goal          Plan  Telephone follow up appointment with care management team member scheduled for: 7/21 at 2 pm  Harlow Asa, PharmD, [place] [phone]

## 2019-05-20 ENCOUNTER — Ambulatory Visit: Payer: Self-pay | Admitting: Licensed Clinical Social Worker

## 2019-05-20 ENCOUNTER — Ambulatory Visit: Payer: PPO | Admitting: Pharmacist

## 2019-05-20 DIAGNOSIS — E1136 Type 2 diabetes mellitus with diabetic cataract: Secondary | ICD-10-CM

## 2019-05-20 NOTE — Patient Instructions (Signed)
Thank you allowing the Chronic Care Management Team to be a part of your care! It was a pleasure speaking with you today!     CCM (Chronic Care Management) Team    Janci Minor RN, BSN Nurse Care Coordinator  [phone]   Harlow Asa PharmD  Clinical Pharmacist  [phone]   Eula Fried LCSW Clinical Social Worker [phone]  Visit Information  Goals Addressed            This Visit's Progress   . "It is depressing to check my blood sugar"       Current Barriers:  . Financial . Limited hearing . Poor medication adherence - reluctance to take medication . Lack of knowledge about type 2 diabetes disease state  Pharmacist Clinical Goal: Over the next 30 days, patient will work with CM Pharmacist to address needs related to diabetes management and medication adherence  Interventions:  Follow up with Aaron Sosa regarding his diabetes medication management  Patient reports that he inreased his metformin 500 mg to twice daily (with breakfast and supper) as directed yesterday, 7/20  Reports stomach side effects have been tolerable and much better than he expected  Follow up with patient regarding his blood pressure control (see numbers below)  Counsel on importance of medication adherence. Aaron Sosa denies any missed doses.  From chart review note that CM Social Worker reached out to patient today and left message. Aaron Sosa reports that he did not answer because he has not yet saved [name]'s number into his phone, but that he did call back and leave a message.  Patient to have his daughter aid him with adding [name]'s phone number to his phone saved contacts  Patient Self Care Activities:   Patient making dietary modifications  Continuing to work on controlling portion size of carbohydrates  Patient to take medications as prescribed  Attends all scheduled provider appointments  Appointment for eye exam scheduled with [facility] for  7/22  Calls pharmacy for medication refills  Patient to check blood sugar regularly and keep log Date Fasting Blood Glucose Notes  17 - July 170   18 - July 166   19 - July 166   20 - July 163 *Increased metformin to 500 mg twice daily   21 - July 148       Patient to check blood pressure daily and keep log Date Blood Pressure  17 - July   18 - July   19 - July   20 - July 111/68, HR 76  21 - July 127/65, HR 84    Please see past updates related to this goal by clicking on the "Past Updates" button in the selected goal          The patient verbalized understanding of instructions provided today and declined a print copy of patient instruction materials.   Telephone follow up appointment with care management team member scheduled for: 7/30 at 3 pm  Harlow Asa, PharmD, [place] [phone]

## 2019-05-20 NOTE — Chronic Care Management (AMB) (Signed)
  Chronic Care Management    Clinical Social Work Follow Up Note  05/20/2019 Name: AIRAM HEIDECKER MRN: [id number] DOB: Sep 09, 1941  Johnney Ou Lehrmann is a 78 y.o. year old male who is a primary care patient of Olin Hauser, DO. The CCM team was consulted for assistance with Mental Health Counseling and Resources.   Review of patient status, including review of consultants reports, other relevant assessments, and collaboration with appropriate care team members and the patient's provider was performed as part of comprehensive patient evaluation and provision of chronic care management services.    LCSW completed CCM outreach attempt today but was unable to reach patient successfully. A HIPPA compliant voice message was left encouraging patient to return call once available. LCSW rescheduled CCM SW appointment as well.  Follow Up Plan: SW will follow up with patient by phone over the next 60 days  Eula Fried, [employer], MSW, [email or web address] Phone: [phone]

## 2019-05-20 NOTE — Chronic Care Management (AMB) (Signed)
  Chronic Care Management   Follow Up Note   05/20/2019 Name: Aaron Sosa MRN: [id number] DOB: [birth year]  Referred by: Aaron Hauser, DO Reason for referral : Chronic Care Management (Patient Phone Call)   Aaron Sosa is a 78 y.o. year old male who is a primary care patient of Aaron Hauser, DO. The CCM team was consulted for assistance with chronic disease management and care coordination needs.  Patient with a past medical history including but not limited to: hypertension, type 2 diabetes, hyperlipidemia, hearing loss of both ears, lower extremity edema and obesity.  I reached out to Aaron Sosa by phone today.   Review of patient status, including review of consultants reports, relevant laboratory and other test results, and collaboration with appropriate care team members and the patient's provider was performed as part of comprehensive patient evaluation and provision of chronic care management services.    Goals Addressed            This Visit's Progress   . "It is depressing to check my blood sugar"       Current Barriers:  . Financial . Limited hearing . Poor medication adherence - reluctance to take medication . Lack of knowledge about type 2 diabetes disease state  Pharmacist Clinical Goal: Over the next 30 days, patient will work with CM Pharmacist to address needs related to diabetes management and medication adherence  Interventions:  Follow up with Aaron Sosa regarding his diabetes medication management  Patient reports that he inreased his metformin 500 mg to twice daily (with breakfast and supper) as directed yesterday, 7/20  Reports stomach side effects have been tolerable and much better than he expected  Follow up with patient regarding his blood pressure control (see numbers below)  Counsel on importance of medication adherence. Aaron Sosa denies any missed doses.  From chart review note that CM Social Worker reached out to  patient today and left message. Aaron Sosa reports that he did not answer because he has not yet saved Aaron Sosa's number into his phone, but that he did call back and leave a message.  Patient to have his daughter aid him with adding Aaron Sosa's phone number to his phone saved contacts  Patient Self Care Activities:   Patient making dietary modifications  Continuing to work on controlling portion size of carbohydrates  Patient to take medications as prescribed  Attends all scheduled provider appointments  Appointment for eye exam scheduled with [facility] for 7/22  Calls pharmacy for medication refills  Patient to check blood sugar regularly and keep log Date Fasting Blood Glucose Notes  17 - July 170   18 - July 166   19 - July 166   20 - July 163 *Increased metformin to 500 mg twice daily   21 - July 148       Patient to check blood pressure daily and keep log Date Blood Pressure  17 - July   18 - July   19 - July   20 - July 111/68, HR 76  21 - July 127/65, HR 84    Please see past updates related to this goal by clicking on the "Past Updates" button in the selected goal          Plan  Telephone follow up appointment with care management team member scheduled for: 7/30 at 3 pm  Aaron Sosa, PharmD, [place] [phone]

## 2019-05-21 DIAGNOSIS — E113291 Type 2 diabetes mellitus with mild nonproliferative diabetic retinopathy without macular edema, right eye: Secondary | ICD-10-CM | POA: Diagnosis not present

## 2019-05-21 DIAGNOSIS — E113212 Type 2 diabetes mellitus with mild nonproliferative diabetic retinopathy with macular edema, left eye: Secondary | ICD-10-CM | POA: Diagnosis not present

## 2019-05-21 LAB — HM DIABETES EYE EXAM

## 2019-05-23 ENCOUNTER — Encounter: Payer: Self-pay | Admitting: Family Medicine

## 2019-05-23 DIAGNOSIS — E11319 Type 2 diabetes mellitus with unspecified diabetic retinopathy without macular edema: Secondary | ICD-10-CM | POA: Insufficient documentation

## 2019-05-29 ENCOUNTER — Ambulatory Visit: Payer: Self-pay | Admitting: Pharmacist

## 2019-05-29 ENCOUNTER — Telehealth: Payer: Self-pay

## 2019-05-29 ENCOUNTER — Ambulatory Visit: Payer: PPO | Admitting: Licensed Clinical Social Worker

## 2019-05-29 ENCOUNTER — Other Ambulatory Visit: Payer: Self-pay

## 2019-05-29 DIAGNOSIS — E1136 Type 2 diabetes mellitus with diabetic cataract: Secondary | ICD-10-CM

## 2019-05-29 NOTE — Chronic Care Management (AMB) (Signed)
  Chronic Care Management   Follow Up Note   05/29/2019 Name: Aaron Sosa MRN: [id number] DOB: Dec 05, 1940  Referred by: Olin Hauser, DO Reason for referral : Chronic Care Management (Patient Phone Call)   Aaron Sosa is a 78 y.o. year old male who is a primary care patient of Olin Hauser, DO. The CCM team was consulted for assistance with chronic disease management and care coordination needs. Patient with a past medical history including but not limited to: hypertension, type 2 diabetes, hyperlipidemia, hearing loss of both ears, lower extremity edema and obesity.  Was unable to reach patient via telephone today and have left HIPAA compliant voicemail asking patient to return my call.  Plan  The care management team will reach out to the patient again over the next 7 days.   Harlow Asa, PharmD, [place] [employer] [phone]

## 2019-05-29 NOTE — Chronic Care Management (AMB) (Signed)
  Chronic Care Management    Clinical Social Work Follow Up Note  05/29/2019 Name: Aaron Sosa MRN: [id number] DOB: 03/15/1941  Aaron Sosa is a 78 y.o. year old male who is a primary care patient of Olin Hauser, DO. The CCM team was consulted for assistance with Mental Health Counseling and Resources.   Review of patient status, including review of consultants reports, other relevant assessments, and collaboration with appropriate care team members and the patient's provider was performed as part of comprehensive patient evaluation and provision of chronic care management services.     Goals Addressed    . "I want to imrove my depression but don't want to do therapy" (pt-stated)       Current Barriers:  . Limited social support . ADL IADL limitations . Social Isolation . Limited access to caregiver . Lacks knowledge of community resource: available mental health support resource options within the area  Clinical Social Work Clinical Goal(s):  Marland Kitchen Over the next 90 days, client will work with SW to address concerns related to depression management  Interventions: . Patient interviewed and appropriate assessments performed . Provided mental health counseling with regard to his daily difficulty with mobility/weakness/right hip pain/balance. LCSW used active and reflective listening and implemented appropriate interventions to help suppport patient and his emotional needs. Advised patient to implement deep breathing/grounding/meditation/self-care exercises into his daily routine to combat stressors. Education provided.  . Provided patient with information about ways to combat depressive symptoms when they arise. LCSW provided education on deep breathing and relaxation techniques to implement into his daily routine to combat stressors  . Discussed plans with patient for ongoing care management follow up and provided patient with direct contact information for care management team .  Advised patient to consider gaining mental health support such as free individual grief counseling with [facility] or considering psychotropic medication treatment but patient declined ALL mental health support again on 05/29/2019. Marland Kitchen Assisted patient/caregiver with obtaining information about health plan benefits . Educated patient on what healthy self-care looks like. Examples provided. Patient admits that he use to go out and walk to gain daily exercise and now he is not able to do so because of his pain. Emotional support provided.  Marland Kitchen LCSW was informed that his daughter was currently visiting him. He reports that she will be getting him a storm door.   Patient Self Care Activities:  . Attends all scheduled provider appointments . Calls provider office for new concerns or questions  Please see past updates related to this goal by clicking on the "Past Updates" button in the selected goal      Follow Up Plan: SW will follow up with patient by phone over the next month  Eula Fried, [place], MSW, [email or web address] Phone: [phone]

## 2019-05-29 NOTE — Chronic Care Management (AMB) (Signed)
  Chronic Care Management   Follow Up Note   05/29/2019 Name: [name] MRN: [id number] DOB: 04/22/1941  Referred by: Karamalegos, Alexander J, DO Reason for referral : Chronic Care Management (Patient Phone Call)   Aaron Sosa is a 77 y.o. year old male who is a primary care patient of Karamalegos, Alexander J, DO. The CCM team was consulted for assistance with chronic disease management and care coordination needs. Patient with a past medical history including but not limited to: hypertension, type 2 diabetes, hyperlipidemia, hearing loss of both ears, lower extremity edema and obesity.  Was unable to reach patient via telephone today and have left HIPAA compliant voicemail asking patient to return my call.  Plan  The care management team will reach out to the patient again over the next 7 days.   Prerna Harold, PharmD, BCACP Clinical Pharmacist [facility] [phone] 

## 2019-06-02 ENCOUNTER — Telehealth: Payer: Self-pay

## 2019-06-02 ENCOUNTER — Ambulatory Visit: Payer: Self-pay | Admitting: Pharmacist

## 2019-06-02 DIAGNOSIS — E1136 Type 2 diabetes mellitus with diabetic cataract: Secondary | ICD-10-CM

## 2019-06-03 NOTE — Chronic Care Management (AMB) (Signed)
  Chronic Care Management   Follow Up Note   06/03/2019 Name: Aaron Sosa MRN: [id number] DOB: 01-25-1941  Referred by: Olin Hauser, DO Reason for referral : Chronic Care Management (Patient Phone Call)   Aaron Sosa is a 78 y.o. year old male who is a primary care patient of Olin Hauser, DO. The CCM team was consulted for assistance with chronic disease management and care coordination needs. Patient with a past medical history including but not limited to: hypertension, type 2 diabetes, hyperlipidemia, hearing loss of both ears, lower extremity edema and obesity.  Was unable to reach patient via telephone today and have left HIPAA compliant voicemail asking patient to return my call. Outreach attempt #2.  Plan  The care management team will reach out to the patient again over the next 7 days.   Harlow Asa, PharmD, [place] [employer] [phone]

## 2019-06-09 ENCOUNTER — Ambulatory Visit: Payer: PPO | Admitting: Pharmacist

## 2019-06-09 DIAGNOSIS — E1136 Type 2 diabetes mellitus with diabetic cataract: Secondary | ICD-10-CM

## 2019-06-09 DIAGNOSIS — I10 Essential (primary) hypertension: Secondary | ICD-10-CM

## 2019-06-09 NOTE — Chronic Care Management (AMB) (Signed)
Chronic Care Management   Follow Up Note   06/09/2019 Name: Aaron Sosa MRN: [id number] DOB: 23-Aug-1941  Referred by: [name], DO Reason for referral : Chronic Care Management (Patient Phone Call)   Aaron Sosa is a 78 y.o. year old male who is a primary care patient of [name], DO. The CCM team was consulted for assistance with chronic disease management and care coordination needs.  Patient with a past medical history including but not limited to: hypertension, type 2 diabetes, hyperlipidemia, hearing loss of both ears, lower extremity edema and obesity.  Was unable to reach patient via telephone today and have left HIPAA compliant voicemail asking patient to return my call. Also call and speak with patient's daughter, Aaron Sosa, as I have tried to reach Aaron Sosa now three times without success.   Receive call back from Aaron Sosa who reports that he had not heard my call and has discovered that the ringer volume on his phone was turned down.  Review of patient status, including review of consultants reports, relevant laboratory and other test results, and collaboration with appropriate care team members and the patient's provider was performed as part of comprehensive patient evaluation and provision of chronic care management services.    Goals Addressed            This Visit's Progress   . "It is depressing to check my blood sugar"       Current Barriers:  . Financial . Limited hearing . Poor medication adherence - reluctance to take medication  Pharmacist Clinical Goal: Over the next 30 days, patient will work with CM Pharmacist to address needs related to diabetes management and medication adherence  Interventions:  Follow up with Aaron Sosa regarding his diabetes medication management  Review recent blood sugars with Aaron Sosa (see numbers below)  Confirms continuing to take metformin 500 mg to twice daily (with breakfast and  supper) as directed  Aaron Sosa asks about whether his metformin dose can be increased for further blood sugar control.  Follow up with patient regarding his blood pressure control (see numbers below)  Aaron Sosa reports that he had a low blood pressure on 7/31 of 87/47, HR 90 and then self- discontinued his lisinopril-hydrochlorothiazide.   Advise patient to check blood pressure regularly and keep log  Patient reports that he has been having joint pain in his right hip, right knee as well as some less severe pain in his left knee.   Advise patient to make a follow up visit with his PCP to discuss this joint pain.   Will collaborate with patient's PCP regarding patient's blood pressure management and blood sugar management  Patient Self Care Activities:   Patient making dietary modifications  Continuing to work on controlling portion size of carbohydrates  Patient to take medications as prescribed  Attends all scheduled provider appointments  Calls pharmacy for medication refills  Patient to check blood sugar regularly and keep log Date Fasting Blood Glucose  3 - August 171  4 - August 161  5 - August 171  6 - August 189  7 - August 183  8 - August 153  9 - August 158  10 - August 158  Average 168    Patient to check blood pressure daily and keep log Date Notes  31 - August  87/47, HR 90  1 - August   2 - August   3 - August   4 - August  123/63, HR 86  5 - August   6 - August   7 - August   8 - August 134/73, HR 91  9 - August   10 - August    Please see past updates related to this goal by clicking on the "Past Updates" button in the selected goal          Plan  The care management team will reach out to the patient again over the next 7 days.   Aaron Sosa, PharmD, [place] [employer] [phone]

## 2019-06-09 NOTE — Patient Instructions (Signed)
Thank you allowing the Chronic Care Management Team to be a part of your care! It was a pleasure speaking with you today!     CCM (Chronic Care Management) Team    Janci Minor RN, BSN Nurse Care Coordinator  [phone]   Harlow Asa PharmD  Clinical Pharmacist  [phone]   Eula Fried LCSW Clinical Social Worker [phone]  Visit Information  Goals Addressed            This Visit's Progress   . "It is depressing to check my blood sugar"       Current Barriers:  . Financial . Limited hearing . Poor medication adherence - reluctance to take medication  Pharmacist Clinical Goal: Over the next 30 days, patient will work with CM Pharmacist to address needs related to diabetes management and medication adherence  Interventions:  Follow up with [name] Sosa regarding his diabetes medication management  Review recent blood sugars with [name] Sosa (see numbers below)  Confirms continuing to take metformin 500 mg to twice daily (with breakfast and supper) as directed  [name] Sosa asks about whether his metformin dose can be increased for further blood sugar control.  Follow up with patient regarding his blood pressure control (see numbers below)  [name] Sosa reports that he had a low blood pressure on 7/31 of 87/47, HR 90 and then self- discontinued his lisinopril-hydrochlorothiazide.   Advise patient to check blood pressure regularly and keep log  Patient reports that he has been having joint pain in his right hip, right knee as well as some less severe pain in his left knee.   Advise patient to make a follow up visit with his PCP to discuss this joint pain.   Will collaborate with patient's PCP regarding patient's blood pressure management and blood sugar management  Patient Self Care Activities:   Patient making dietary modifications  Continuing to work on controlling portion size of carbohydrates  Patient to take medications as  prescribed  Attends all scheduled provider appointments  Calls pharmacy for medication refills  Patient to check blood sugar regularly and keep log Date Fasting Blood Glucose  3 - August 171  4 - August 161  5 - August 171  6 - August 189  7 - August 183  8 - August 153  9 - August 158  10 - August 158  Average 168    Patient to check blood pressure daily and keep log Date Notes  31 - August  87/47, HR 90  1 - August   2 - August   3 - August   4 - August 123/63, HR 86  5 - August   6 - August   7 - August   8 - August 134/73, HR 91  9 - August   10 - August    Please see past updates related to this goal by clicking on the "Past Updates" button in the selected goal          The patient verbalized understanding of instructions provided today and declined a print copy of patient instruction materials.   The care management team will reach out to the patient again over the next 7 days.   Harlow Asa, PharmD, [place] [employer] [phone]

## 2019-06-10 ENCOUNTER — Other Ambulatory Visit: Payer: Self-pay | Admitting: Family Medicine

## 2019-06-10 ENCOUNTER — Ambulatory Visit: Payer: Self-pay | Admitting: Pharmacist

## 2019-06-10 DIAGNOSIS — I10 Essential (primary) hypertension: Secondary | ICD-10-CM

## 2019-06-10 DIAGNOSIS — E1136 Type 2 diabetes mellitus with diabetic cataract: Secondary | ICD-10-CM

## 2019-06-10 MED ORDER — METFORMIN HCL 500 MG PO TABS: ORAL_TABLET | ORAL | 1 refills | Status: DC

## 2019-06-10 NOTE — Chronic Care Management (AMB) (Signed)
  Chronic Care Management   Follow Up Note   06/10/2019 Name: Aaron Sosa MRN: [id number] DOB: 04/28/1941  Referred by: Aaron Hauser, DO Reason for referral : Chronic Care Management (Patient Phone Call)   Aaron Sosa is a 78 y.o. year old male who is a primary care patient of Aaron Hauser, DO. The CCM team was consulted for assistance with chronic disease management and care coordination needs.  Patient with a past medical history including but not limited to: hypertension, type 2 diabetes, hyperlipidemia, hearing loss of both ears, lower extremity edema and obesity.  I reached out to Aaron Sosa by phone today.   Review of patient status, including review of consultants reports, relevant laboratory and other test results, and collaboration with appropriate care team members and the patient's provider was performed as part of comprehensive patient evaluation and provision of chronic care management services.    Goals Addressed            This Visit's Progress   . "It is depressing to check my blood sugar"       Current Barriers:  . Financial . Limited hearing . Poor medication adherence - reluctance to take medication  Pharmacist Clinical Goal: Over the next 30 days, patient will work with CM Pharmacist to address needs related to diabetes management and medication adherence  Interventions:  Collaborate with patient's PCP regarding patient's blood pressure management and blood sugar management  Dr. Parks Ranger agrees with increasing patient's metformin dose to:  Metformin 500 mg - 2 tablets (1000 mg) QAM with breakfast and 1 tablet (500 mg) QPM with supper  Follow up with Aaron Sosa regarding his diabetes medication management  Advise patient regarding metformin dose increase by PCP. Patient verbalizes understanding via teach back method that he is to increase his metformin dose to 2 tablets (1000 mg) QAM with breakfast and 1 tablet (500 mg) QPM  with supper  Remind patient of the importance of taking each dose with meals  Advise patient to check blood pressure regularly and keep log  Encourage patient to make a follow up visit with his PCP to discuss this joint pain.   Patient Self Care Activities:   Patient making dietary modifications  Continuing to work on controlling portion size of carbohydrates  Patient to take medications as prescribed  Attends all scheduled provider appointments  Calls pharmacy for medication refills  Patient to check blood sugar regularly and keep log  Patient to check blood pressure daily and keep log  Please see past updates related to this goal by clicking on the "Past Updates" button in the selected goal          Plan  Telephone follow up appointment with care management team member scheduled for: 8/18 at 2 pm  Harlow Asa, PharmD, [place] [phone]

## 2019-06-10 NOTE — Patient Instructions (Signed)
Thank you allowing the Chronic Care Management Team to be a part of your care! It was a pleasure speaking with you today!     CCM (Chronic Care Management) Team    Janci Minor RN, BSN Nurse Care Coordinator  [phone]   Harlow Asa PharmD  Clinical Pharmacist  [phone]   Eula Fried LCSW Clinical Social Worker [phone]  Visit Information  Goals Addressed            This Visit's Progress   . "It is depressing to check my blood sugar"       Current Barriers:  . Financial . Limited hearing . Poor medication adherence - reluctance to take medication  Pharmacist Clinical Goal: Over the next 30 days, patient will work with CM Pharmacist to address needs related to diabetes management and medication adherence  Interventions:  Collaborate with patient's PCP regarding patient's blood pressure management and blood sugar management  Dr. Parks Ranger agrees with increasing patient's metformin dose to:  Metformin 500 mg - 2 tablets (1000 mg) QAM with breakfast and 1 tablet (500 mg) QPM with supper  Follow up with Mr. Mackins regarding his diabetes medication management  Advise patient regarding metformin dose increase by PCP. Patient verbalizes understanding via teach back method that he is to increase his metformin dose to 2 tablets (1000 mg) QAM with breakfast and 1 tablet (500 mg) QPM with supper  Remind patient of the importance of taking each dose with meals  Advise patient to check blood pressure regularly and keep log  Encourage patient to make a follow up visit with his PCP to discuss this joint pain.   Patient Self Care Activities:   Patient making dietary modifications  Continuing to work on controlling portion size of carbohydrates  Patient to take medications as prescribed  Attends all scheduled provider appointments  Calls pharmacy for medication refills  Patient to check blood sugar regularly and keep log  Patient to check blood  pressure daily and keep log  Please see past updates related to this goal by clicking on the "Past Updates" button in the selected goal          The patient verbalized understanding of instructions provided today and declined a print copy of patient instruction materials.   Telephone follow up appointment with care management team member scheduled for: 8/18 at 2 pm  Harlow Asa, PharmD, [place] [phone]

## 2019-06-13 ENCOUNTER — Telehealth: Payer: Self-pay | Admitting: Family Medicine

## 2019-06-13 DIAGNOSIS — E1136 Type 2 diabetes mellitus with diabetic cataract: Secondary | ICD-10-CM

## 2019-06-13 MED ORDER — ONETOUCH ULTRA VI STRP: ORAL_STRIP | 12 refills | Status: DC

## 2019-06-13 NOTE — Telephone Encounter (Signed)
Pt  Called requesting refill on one touch ultra 50 strips. Pt call back  # is  [phone]

## 2019-06-17 ENCOUNTER — Ambulatory Visit: Payer: Self-pay | Admitting: Pharmacist

## 2019-06-17 DIAGNOSIS — I10 Essential (primary) hypertension: Secondary | ICD-10-CM

## 2019-06-17 DIAGNOSIS — E1136 Type 2 diabetes mellitus with diabetic cataract: Secondary | ICD-10-CM

## 2019-06-17 NOTE — Chronic Care Management (AMB) (Signed)
Chronic Care Management   Follow Up Note   06/17/2019 Name: Aaron Sosa MRN: [id number] DOB: [birth year]  Referred by: Aaron Hauser, DO Reason for referral : Chronic Care Management (Patient Phone Call)   Aaron Sosa is a 78 y.o. year old male who is a primary care patient of Aaron Hauser, DO. The CCM team was consulted for assistance with chronic disease management and care coordination needs. Patient with a past medical history including but not limited to: hypertension, type 2 diabetes, hyperlipidemia, hearing loss of both ears, lower extremity edema and obesity.  I reached out to Aaron Sosa by phone today.   Review of patient status, including review of consultants reports, relevant laboratory and other test results, and collaboration with appropriate care team members and the patient's provider was performed as part of comprehensive patient evaluation and provision of chronic care management services.    Outpatient Encounter Medications as of 06/17/2019  Medication Sig  . metFORMIN (GLUCOPHAGE) 500 MG tablet Take 2 tablets each morning with breakfast and 1 tablet each evening with supper  . acetaminophen (TYLENOL) 325 MG tablet Take by mouth.  Marland Kitchen lisinopril-hydrochlorothiazide (ZESTORETIC) 20-12.5 MG tablet Take 1 tablet by mouth daily. (Patient not taking: Reported on 06/09/2019)  . magnesium 30 MG tablet Take 30 mg by mouth once.  . Multiple Vitamin (MULTIVITAMIN) capsule Take 1 capsule by mouth daily.  [name] ULTRA test strip Use to check blood sugar up to 2 x daily   No facility-administered encounter medications on file as of 06/17/2019.     Goals Addressed            This Visit's Progress   . "It is depressing to check my blood sugar"       Current Barriers:  . Financial . Limited hearing . Reluctance to take medication  Pharmacist Clinical Goal: Over the next 30 days, patient will work with CM Pharmacist to address needs related to  diabetes management and medication adherence  Interventions:  Follow up with [name] regarding his diabetes medication management  Patient confirms increasing his metformin dose to 2 tablets (1000 mg) QAM with breakfast and 1 tablet (500 mg) QPM with supper, starting last Wednesday  Denies issues with tolerating dose increase  Review recent blood sugars  Counsel on continued importance diet in blood sugar control - address specific dietary questions from patient.  Advise patient to check blood pressure regularly and keep log  Denies checking blood pressure since 8/8  Counsel on importance of monitoring and monitoring technique  Request patient check blood pressure today. Call back for result: 138/81, HR 86  Currently off of lisinopril-hydrocholorothiazide (patient self-discontinued 8/1 following episode of hypotension)  Advise patient against climbing ladder to roof himself. [name] reports planning to complete work on his roof. Patient agrees to instead discuss with his daughter to ask her and his son-in-law for assistance with completing the project.  Patient Self Care Activities:   Patient making dietary modifications  Continuing to work on controlling portion size of carbohydrates  Patient to take medications as prescribed  Attends all scheduled provider appointments  Calls pharmacy for medication refills  Patient to check blood sugar regularly and keep log Date Fasting Blood Glucose Notes  12 - August 160 *Increased metformin to 1000 mg QAM and 500 mg QPM  13 - August 163   14 - August 165   15 - August 147   16 - August 149   17 -  August 155   18 - August 150   Average 156     Patient to check blood pressure daily and keep log  Please see past updates related to this goal by clicking on the "Past Updates" button in the selected goal          Plan  Telephone follow up appointment with care management team member scheduled for: 8/25 at 1:30 pm   Duanne Moron, PharmD, [name] Clinical Pharmacist [facility] [phone]

## 2019-06-17 NOTE — Patient Instructions (Signed)
Thank you allowing the Chronic Care Management Team to be a part of your care! It was a pleasure speaking with you today!     CCM (Chronic Care Management) Team    Janci Minor RN, BSN Nurse Care Coordinator  [phone]   Harlow Asa PharmD  Clinical Pharmacist  [phone]   Eula Fried LCSW Clinical Social Worker [phone]  Visit Information  Goals Addressed            This Visit's Progress   . "It is depressing to check my blood sugar"       Current Barriers:  . Financial . Limited hearing . Reluctance to take medication  Pharmacist Clinical Goal: Over the next 30 days, patient will work with CM Pharmacist to address needs related to diabetes management and medication adherence  Interventions:  Follow up with Mr. Glaspy regarding his diabetes medication management  Patient confirms increasing his metformin dose to 2 tablets (1000 mg) QAM with breakfast and 1 tablet (500 mg) QPM with supper, starting last Wednesday  Denies issues with tolerating dose increase  Review recent blood sugars  Counsel on continued importance diet in blood sugar control - address specific dietary questions from patient.  Advise patient to check blood pressure regularly and keep log  Denies checking blood pressure since 8/8  Counsel on importance of monitoring and monitoring technique  Request patient check blood pressure today. Call back for result: 138/81, HR 86  Currently off of lisinopril-hydrocholorothiazide (patient self-discontinued 8/1 following episode of hypotension)  Advise patient against climbing ladder to roof himself. Mr. Goodwine reports planning to complete work on his roof. Patient agrees to instead discuss with his daughter to ask her and his son-in-law for assistance with completing the project.  Patient Self Care Activities:   Patient making dietary modifications  Continuing to work on controlling portion size of carbohydrates  Patient to  take medications as prescribed  Attends all scheduled provider appointments  Calls pharmacy for medication refills  Patient to check blood sugar regularly and keep log Date Fasting Blood Glucose Notes  12 - August 160 *Increased metformin to 1000 mg QAM and 500 mg QPM  13 - August 163   14 - August 165   15 - August 147   16 - August 149   17 - August 155   18 - August 150   Average 156     Patient to check blood pressure daily and keep log  Please see past updates related to this goal by clicking on the "Past Updates" button in the selected goal          The patient verbalized understanding of instructions provided today and declined a print copy of patient instruction materials.   Telephone follow up appointment with care management team member scheduled for: 8/25 at 1:30 pm  Harlow Asa, PharmD, [place] [phone]

## 2019-06-24 ENCOUNTER — Ambulatory Visit (INDEPENDENT_AMBULATORY_CARE_PROVIDER_SITE_OTHER): Payer: PPO | Admitting: Pharmacist

## 2019-06-24 DIAGNOSIS — E1169 Type 2 diabetes mellitus with other specified complication: Secondary | ICD-10-CM

## 2019-06-24 DIAGNOSIS — E785 Hyperlipidemia, unspecified: Secondary | ICD-10-CM

## 2019-06-24 DIAGNOSIS — I10 Essential (primary) hypertension: Secondary | ICD-10-CM

## 2019-06-24 DIAGNOSIS — E1136 Type 2 diabetes mellitus with diabetic cataract: Secondary | ICD-10-CM | POA: Diagnosis not present

## 2019-06-24 NOTE — Patient Instructions (Signed)
Thank you allowing the Chronic Care Management Team to be a part of your care! It was a pleasure speaking with you today!     CCM (Chronic Care Management) Team    Janci Minor RN, BSN Nurse Care Coordinator  [phone]   Harlow Asa PharmD  Clinical Pharmacist  [phone]   Eula Fried LCSW Clinical Social Worker [phone]  Visit Information  Goals Addressed            This Visit's Progress   . "It is depressing to check my blood sugar"       Current Barriers:  . Financial . Limited hearing . Reluctance to take medication  Pharmacist Clinical Goal: Over the next 30 days, patient will work with CM Pharmacist to address needs related to diabetes management and medication adherence  Interventions:  Follow up with Mr. Oommen regarding his diabetes medication management  Patient confirms taking his metformin dose to 2 tablets (1000 mg) QAM with breakfast and 1 tablet (500 mg) QPM with supper as directed  Review recent blood sugars  Counsel on continued importance diet in blood sugar control   Advise patient to check blood pressure regularly and keep log  Review recent blood pressure readings  Patient attributes high reading on 8/23 to having more salt in his diet  Counsel patient regarding relationship between dietary salt and blood pressure  Had been off of lisinopril-HCTZ (patient self-discontinued 8/1 following episode of hypotension)  Reports since this high reading on 8/23 restarted the lisinopril-HCTZ, but only wants to take as needed  Will follow up with PCP regarding patient's recent blood sugar results and his medication management.  Discuss with patient again the importance of cholesterol control, particularly as he has type 2 diabetes mellitus. Patient verbalizes understanding, but states that he will not consider taking cholesterol medication again due to the muscle side effects that he previously experienced.  Patient Self Care  Activities:   Patient making dietary modifications  Continuing to work on controlling portion size of carbohydrates  Patient to take medications as prescribed  Attends all scheduled provider appointments  Calls pharmacy for medication refills  Patient to check blood sugar regularly and keep log Date Fasting Blood Glucose Notes  19 - August  *Obtained new test strips  20 - August 161   21 - August 139   22 - August 118   23 - August 123   24 - August 133   25 - August - *Meter needed new battery - now replaced  Average 135     Patient to check blood pressure daily and keep log Date Blood Pressure Notes  19 - August 125/65, HR 81   20 - August 131/67, HR 84   21 - August    22 - August    23 - August 171/91, HR 77 *Attributes to having more salt in diet this day  24 - August 143/81, HR 86   25 - August 130/67, HR 79     Please see past updates related to this goal by clicking on the "Past Updates" button in the selected goal          The patient verbalized understanding of instructions provided today and declined a print copy of patient instruction materials.   The care management team will reach out to the patient again over the next 14 days.   Harlow Asa, PharmD, [place] [employer] [phone]

## 2019-06-24 NOTE — Chronic Care Management (AMB) (Signed)
Chronic Care Management   Follow Up Note   06/24/2019 Name: Aaron Sosa MRN: [id number] DOB: 1941-07-09  Referred by: [name], DO Reason for referral : Chronic Care Management (Patient Phone Call)   Aaron Sosa is a 78 y.o. year old male who is a primary care patient of [name], DO. The CCM team was consulted for assistance with chronic disease management and care coordination needs.  Patient with a past medical history including but not limited to: hypertension, type 2 diabetes, hyperlipidemia, hearing loss of both ears, lower extremity edema and obesity.  I reached out to Aaron Sosa by phone today.   Review of patient status, including review of consultants reports, relevant laboratory and other test results, and collaboration with appropriate care team members and the patient's provider was performed as part of comprehensive patient evaluation and provision of chronic care management services.    Outpatient Encounter Medications as of 06/24/2019  Medication Sig  . acetaminophen (TYLENOL) 325 MG tablet Take by mouth.  Marland Kitchen lisinopril-hydrochlorothiazide (ZESTORETIC) 20-12.5 MG tablet Take 1 tablet by mouth daily. (Patient not taking: Reported on 06/09/2019)  . magnesium 30 MG tablet Take 30 mg by mouth once.  . metFORMIN (GLUCOPHAGE) 500 MG tablet Take 2 tablets each morning with breakfast and 1 tablet each evening with supper  . Multiple Vitamin (MULTIVITAMIN) capsule Take 1 capsule by mouth daily.  Letta Pate ULTRA test strip Use to check blood sugar up to 2 x daily   No facility-administered encounter medications on file as of 06/24/2019.     Goals Addressed            This Visit's Progress   . "It is depressing to check my blood sugar"       Current Barriers:  . Financial . Limited hearing . Reluctance to take medication  Pharmacist Clinical Goal: Over the next 30 days, patient will work with CM Pharmacist to address needs related to  diabetes management and medication adherence  Interventions:  Follow up with Aaron Sosa regarding his diabetes medication management  Patient confirms taking his metformin dose to 2 tablets (1000 mg) QAM with breakfast and 1 tablet (500 mg) QPM with supper as directed  Review recent blood sugars  Counsel on continued importance diet in blood sugar control   Advise patient to check blood pressure regularly and keep log  Review recent blood pressure readings  Patient attributes high reading on 8/23 to having more salt in his diet  Counsel patient regarding relationship between dietary salt and blood pressure  Had been off of lisinopril-HCTZ (patient self-discontinued 8/1 following episode of hypotension)  Reports since this high reading on 8/23 restarted the lisinopril-HCTZ, but only wants to take as needed  Will follow up with PCP regarding patient's recent blood sugar results and his medication management.  Discuss with patient again the importance of cholesterol control, particularly as he has type 2 diabetes mellitus. Patient verbalizes understanding, but states that he will not consider taking cholesterol medication again due to the muscle side effects that he previously experienced.  Patient Self Care Activities:   Patient making dietary modifications  Continuing to work on controlling portion size of carbohydrates  Patient to take medications as prescribed  Attends all scheduled provider appointments  Calls pharmacy for medication refills  Patient to check blood sugar regularly and keep log Date Fasting Blood Glucose Notes  19 - August  *Obtained new test strips  20 - August 161  21 - August 139   22 - August 118   23 - August 123   24 - August 133   25 - August - *Meter needed new battery - now replaced  Average 135     Patient to check blood pressure daily and keep log Date Blood Pressure Notes  19 - August 125/65, HR 81   20 - August 131/67, HR 84    21 - August    22 - August    23 - August 171/91, HR 77 *Attributes to having more salt in diet this day  24 - August 143/81, HR 86   25 - August 130/67, HR 79     Please see past updates related to this goal by clicking on the "Past Updates" button in the selected goal         Plan   The care management team will reach out to the patient again over the next 14 days.   Harlow Asa, PharmD, [place] [employer] [phone]

## 2019-06-25 ENCOUNTER — Other Ambulatory Visit: Payer: Self-pay | Admitting: Family Medicine

## 2019-06-25 ENCOUNTER — Ambulatory Visit: Payer: Self-pay | Admitting: Pharmacist

## 2019-06-25 DIAGNOSIS — I10 Essential (primary) hypertension: Secondary | ICD-10-CM

## 2019-06-25 MED ORDER — LISINOPRIL 10 MG PO TABS: 10.0000 mg | ORAL_TABLET | Freq: Every day | ORAL | 1 refills | Status: DC

## 2019-06-25 NOTE — Chronic Care Management (AMB) (Signed)
  Chronic Care Management   Follow Up Note   06/25/2019 Name: Aaron Sosa MRN: [id number] DOB: 06/19/1941  Referred by: Aaron Hauser, DO Reason for referral : Chronic Care Management (Patient Phone Call)   Aaron Sosa is a 78 y.o. year old male who is a primary care patient of Aaron Hauser, DO. The CCM team was consulted for assistance with chronic disease management and care coordination needs.  Patient with a past medical history including but not limited to: hypertension, type 2 diabetes, hyperlipidemia, hearing loss of both ears, lower extremity edema and obesity.  I reached out to Aaron Sosa by phone today.   Review of patient status, including review of consultants reports, relevant laboratory and other test results, and collaboration with appropriate care team members and the patient's provider was performed as part of comprehensive patient evaluation and provision of chronic care management services.    Outpatient Encounter Medications as of 06/25/2019  Medication Sig  . acetaminophen (TYLENOL) 325 MG tablet Take by mouth.  Marland Kitchen lisinopril-hydrochlorothiazide (ZESTORETIC) 20-12.5 MG tablet Take 1 tablet by mouth daily. (Patient not taking: Reported on 06/09/2019)  . magnesium 30 MG tablet Take 30 mg by mouth once.  . metFORMIN (GLUCOPHAGE) 500 MG tablet Take 2 tablets each morning with breakfast and 1 tablet each evening with supper  . Multiple Vitamin (MULTIVITAMIN) capsule Take 1 capsule by mouth daily.  Aaron Sosa ULTRA test strip Use to check blood sugar up to 2 x daily   No facility-administered encounter medications on file as of 06/25/2019.     Goals Addressed            This Visit's Progress   . "It is depressing to check my blood sugar"       Current Barriers:  . Financial . Limited hearing . Reluctance to take medication  Pharmacist Clinical Goal: Over the next 30 days, patient will work with CM Pharmacist to address needs related to  diabetes management and medication adherence  Interventions:  Collaborated with PCP regarding patient's recent blood sugar results and his medication management  PCP agrees with adjusting patient's hypertension medication regimen. Aaron Sosa to stop taking lisinopril-HCTZ and start taking lisinopril 10 mg once daily.  Follow up call to Aaron Sosa  Counsel Aaron Sosa to STOP taking lisinopril-HCTZ and to START taking lisinopril 10 mg once daily as directed by PCP  Patient verbalized understanding and states that he will follow up with his pharmacy tomorrow about picking up the lisinopril  Advise patient to check blood pressure once daily and keep log  Follow up with PCP to request new lisinopril 10 mg once daily Rx be called into [facility]'s [facility].  Patient Self Care Activities:   Patient making dietary modifications  Continuing to work on controlling portion size of carbohydrates  Patient to take medications as prescribed  Attends all scheduled provider appointments  Calls pharmacy for medication refills  Patient to check blood sugar regularly and keep log  Patient to check blood pressure daily and keep log   Please see past updates related to this goal by clicking on the "Past Updates" button in the selected goal          Plan  Telephone follow up appointment with care management team member scheduled for: 9/2 at 1:30 pm  Harlow Asa, PharmD, [place] [phone]

## 2019-06-25 NOTE — Patient Instructions (Signed)
Thank you allowing the Chronic Care Management Team to be a part of your care! It was a pleasure speaking with you today!     CCM (Chronic Care Management) Team    Janci Minor RN, BSN Nurse Care Coordinator  [phone]   Harlow Asa PharmD  Clinical Pharmacist  [phone]   Eula Fried LCSW Clinical Social Worker [phone]  Visit Information  Goals Addressed            This Visit's Progress   . "It is depressing to check my blood sugar"       Current Barriers:  . Financial . Limited hearing . Reluctance to take medication  Pharmacist Clinical Goal: Over the next 30 days, patient will work with CM Pharmacist to address needs related to diabetes management and medication adherence  Interventions:  Collaborated with PCP regarding patient's recent blood sugar results and his medication management  PCP agrees with adjusting patient's hypertension medication regimen. Mr. Illes to stop taking lisinopril-HCTZ and start taking lisinopril 10 mg once daily.  Follow up call to [name]  Counsel Mr. Mutchler to STOP taking lisinopril-HCTZ and to START taking lisinopril 10 mg once daily as directed by PCP  Patient verbalized understanding and states that he will follow up with his pharmacy tomorrow about picking up the lisinopril  Advise patient to check blood pressure once daily and keep log  Follow up with PCP to request new lisinopril 10 mg once daily Rx be called into [facility].  Patient Self Care Activities:   Patient making dietary modifications  Continuing to work on controlling portion size of carbohydrates  Patient to take medications as prescribed  Attends all scheduled provider appointments  Calls pharmacy for medication refills  Patient to check blood sugar regularly and keep log  Patient to check blood pressure daily and keep log   Please see past updates related to this goal by clicking on the "Past Updates"  button in the selected goal          The patient verbalized understanding of instructions provided today and declined a print copy of patient instruction materials.   Telephone follow up appointment with care management team member scheduled for: 9/2 at 1:30 pm  Harlow Asa, PharmD, [place] [phone]

## 2019-06-26 ENCOUNTER — Ambulatory Visit: Payer: Self-pay | Admitting: Licensed Clinical Social Worker

## 2019-06-26 ENCOUNTER — Telehealth: Payer: Self-pay

## 2019-06-26 NOTE — Chronic Care Management (AMB) (Signed)
  Chronic Care Management    Clinical Social Work Follow Up Note  06/26/2019 Name: Aaron Sosa MRN: [id number] DOB: 04/27/1941  Aaron Sosa is a 78 y.o. year old male who is a primary care patient of Olin Hauser, DO. The CCM team was consulted for assistance with Mental Health Counseling and Resources.   Review of patient status, including review of consultants reports, other relevant assessments, and collaboration with appropriate care team members and the patient's provider was performed as part of comprehensive patient evaluation and provision of chronic care management services.     Outpatient Encounter Medications as of 06/26/2019  Medication Sig  . acetaminophen (TYLENOL) 325 MG tablet Take by mouth.  Marland Kitchen lisinopril (ZESTRIL) 10 MG tablet Take 1 tablet (10 mg total) by mouth daily.  . magnesium 30 MG tablet Take 30 mg by mouth once.  . metFORMIN (GLUCOPHAGE) 500 MG tablet Take 2 tablets each morning with breakfast and 1 tablet each evening with supper  . Multiple Vitamin (MULTIVITAMIN) capsule Take 1 capsule by mouth daily.  [name] ULTRA test strip Use to check blood sugar up to 2 x daily   No facility-administered encounter medications on file as of 06/26/2019.     LCSW completed CCM outreach attempt today but was unable to reach patient successfully. A HIPPA compliant voice message was left encouraging patient to return call once available. LCSW rescheduled CCM SW appointment as well.  Follow Up Plan: SW will follow up with patient by phone over the next 60 days  Eula Fried, [employer], MSW, [email or web address] Phone: [phone]

## 2019-07-02 ENCOUNTER — Ambulatory Visit: Payer: PPO | Admitting: Pharmacist

## 2019-07-02 DIAGNOSIS — E1136 Type 2 diabetes mellitus with diabetic cataract: Secondary | ICD-10-CM

## 2019-07-02 DIAGNOSIS — E785 Hyperlipidemia, unspecified: Secondary | ICD-10-CM

## 2019-07-02 DIAGNOSIS — E1169 Type 2 diabetes mellitus with other specified complication: Secondary | ICD-10-CM

## 2019-07-02 DIAGNOSIS — I10 Essential (primary) hypertension: Secondary | ICD-10-CM

## 2019-07-02 NOTE — Patient Instructions (Signed)
Thank you allowing the Chronic Care Management Team to be a part of your care! It was a pleasure speaking with you today!     CCM (Chronic Care Management) Team    Janci Minor RN, BSN Nurse Care Coordinator  [phone]   Harlow Asa PharmD  Clinical Pharmacist  [phone]   Eula Fried LCSW Clinical Social Worker [phone]  Visit Information  Goals Addressed            This Visit's Progress   . "It is depressing to check my blood sugar"       Current Barriers:  . Financial . Limited hearing . Reluctance to take medication  Pharmacist Clinical Goal: Over the next 30 days, patient will work with CM Pharmacist and PCP to address needs related to diabetes management and medication adherence  Interventions: . Follow up with Aaron Sosa regarding his diabetes medication management o Patient confirms taking his metformin dose to 2 tablets (1000 mg) QAM with breakfast and 1 tablet (500 mg) QPM with supper as directed o Review recent blood sugars (see below) o Counsel on continued importance of dietary choices in blood sugar control  . Follow up with patient regarding his blood pressure management  o Review recent blood pressure readings (see below) o Confirms taking lisinopril 10 mg once daily, starting on 8/28 . Discuss with patient again the importance of cholesterol control, including impact of dietary choices.  o Patient verbalizes understanding and states that he has been thinking about his cholesterol; while not ready to restart a statin medication today, would like to discuss this option again at our next telephone appointment . 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 Aaron Sosa states that he feels like this has improved. Encourage patient again to follow up with PCP about this issue in the future  Patient Self Care Activities:   Patient making dietary modifications  Continuing to work on controlling portion size of  carbohydrates  Patient to take medications as prescribed  Attends all scheduled provider appointments  Calls pharmacy for medication refills  Patient to check blood sugar regularly and keep log Date Fasting Blood Glucose  26 - August 134  27 - August 151  28 - August 146  29 - August 143  30 - August 134  31 - August 158  1 - September 143  2 - September 153  Average 145    Patient to check blood pressure daily and keep log  Date Blood Pressure Notes  26 - August -   27 - August -   28 - August - *Started lisinopril 10 mg daily  29 - August 132/72, HR 86   30 - August -   31 - August 134/84, HR 75   1 - September 141/79, HR 85   2 - September -    Please see past updates related to this goal by clicking on the "Past Updates" button in the selected goal          The patient verbalized understanding of instructions provided today and declined a print copy of patient instruction materials.   Telephone follow up appointment with care management team member scheduled for: 9/16 at 1 pm  Harlow Asa, PharmD, [place] [phone]

## 2019-07-02 NOTE — Chronic Care Management (AMB) (Signed)
Chronic Care Management   Follow Up Note   07/02/2019 Name: Aaron Sosa MRN: [id number] DOB: [birth year]  Referred by: Aaron Cords, DO Reason for referral : Chronic Care Management (Patient Phone Call)   Aaron Sosa is a 78 y.o. year old male who is a primary care patient of Aaron Cords, DO. The CCM team was consulted for assistance with chronic disease management and care coordination needs.  Patient with a past medical history including but not limited to: hypertension, type 2 diabetes, hyperlipidemia, hearing loss of both ears, lower extremity edema and obesity.  I reached out to Aaron Sosa by phone today.   Review of patient status, including review of consultants reports, relevant laboratory and other test results, and collaboration with appropriate care team members and the patient's provider was performed as part of comprehensive patient evaluation and provision of chronic care management services.     Outpatient Encounter Medications as of 07/02/2019  Medication Sig  . lisinopril (ZESTRIL) 10 MG tablet Take 1 tablet (10 mg total) by mouth daily.  . metFORMIN (GLUCOPHAGE) 500 MG tablet Take 2 tablets each morning with breakfast and 1 tablet each evening with supper  . acetaminophen (TYLENOL) 325 MG tablet Take by mouth.  . magnesium 30 MG tablet Take 30 mg by mouth once.  . Multiple Vitamin (MULTIVITAMIN) capsule Take 1 capsule by mouth daily.  Aaron Sosa ULTRA test strip Use to check blood sugar up to 2 x daily   No facility-administered encounter medications on file as of 07/02/2019.     Goals Addressed            This Visit's Progress   . "It is depressing to check my blood sugar"       Current Barriers:  . Financial . Limited hearing . Reluctance to take medication  Pharmacist Clinical Goal: Over the next 30 days, patient will work with CM Pharmacist and PCP to address needs related to diabetes management and medication adherence   Interventions: . Follow up with Aaron Sosa regarding his diabetes medication management o Patient confirms taking his metformin dose to 2 tablets (1000 mg) QAM with breakfast and 1 tablet (500 mg) QPM with supper as directed o Review recent blood sugars (see below) o Counsel on continued importance of dietary choices in blood sugar control  . Follow up with patient regarding his blood pressure management  o Review recent blood pressure readings (see below) o Confirms taking lisinopril 10 mg once daily, starting on 8/28 . Discuss with patient again the importance of cholesterol control, including impact of dietary choices.  o Patient verbalizes understanding and states that he has been thinking about his cholesterol; while not ready to restart a statin medication today, would like to discuss this option again at our next telephone appointment . 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 Aaron Sosa states that he feels like this has improved. Encourage patient again to follow up with PCP about this issue in the future  Patient Self Care Activities:   Patient making dietary modifications  Continuing to work on controlling portion size of carbohydrates  Patient to take medications as prescribed  Attends all scheduled provider appointments  Calls pharmacy for medication refills  Patient to check blood sugar regularly and keep log Date Fasting Blood Glucose  26 - August 134  27 - August 151  28 - August 146  29 - August 143  30 - August 134  31 - August  158  1 - September 143  2 - September 153  Average 145    Patient to check blood pressure daily and keep log  Date Blood Pressure Notes  26 - August -   27 - August -   28 - August - *Started lisinopril 10 mg daily  29 - August 132/72, HR 86   30 - August -   31 - August 134/84, HR 75   1 - September 141/79, HR 85   2 - September -    Please see past updates related to this goal by clicking on the "Past  Updates" button in the selected goal          Plan  Telephone follow up appointment with care management team member scheduled for: 9/16 at 1 pm  Harlow Asa, PharmD, [place] [phone]

## 2019-07-16 ENCOUNTER — Ambulatory Visit: Payer: Self-pay | Admitting: Pharmacist

## 2019-07-16 DIAGNOSIS — I10 Essential (primary) hypertension: Secondary | ICD-10-CM

## 2019-07-16 DIAGNOSIS — E1136 Type 2 diabetes mellitus with diabetic cataract: Secondary | ICD-10-CM

## 2019-07-16 DIAGNOSIS — E1169 Type 2 diabetes mellitus with other specified complication: Secondary | ICD-10-CM

## 2019-07-16 NOTE — Chronic Care Management (AMB) (Signed)
Chronic Care Management   Follow Up Note   07/16/2019 Name: Aaron Sosa MRN: [id number] DOB: 06-Apr-1941  Referred by: Aaron Cords, DO Reason for referral : Chronic Care Management (Patient Phone Call)   Aaron Sosa is a 78 y.o. year old male who is a primary care patient of Aaron Cords, DO. The CCM team was consulted for assistance with chronic disease management and care coordination needs.  Patient with a past medical history including but not limited to: hypertension, type 2 diabetes, hyperlipidemia, hearing loss of both ears, lower extremity edema and obesity.  I reached out to Aaron Sosa by phone today.   Review of patient status, including review of consultants reports, relevant laboratory and other test results, and collaboration with appropriate care team members and the patient's provider was performed as part of comprehensive patient evaluation and provision of chronic care management services.    SDOH (Social Determinants of Health) screening performed today: Physical Activity. See Care Plan for related entries.   Outpatient Encounter Medications as of 07/16/2019  Medication Sig  . lisinopril (ZESTRIL) 10 MG tablet Take 1 tablet (10 mg total) by mouth daily.  . metFORMIN (GLUCOPHAGE) 500 MG tablet Take 2 tablets each morning with breakfast and 1 tablet each evening with supper  . acetaminophen (TYLENOL) 325 MG tablet Take by mouth.  . magnesium 30 MG tablet Take 30 mg by mouth once.  . Multiple Vitamin (MULTIVITAMIN) capsule Take 1 capsule by mouth daily.  Aaron Sosa ULTRA test strip Use to check blood sugar up to 2 x daily   No facility-administered encounter medications on file as of 07/16/2019.     Goals Addressed            This Visit's Progress   . "It is depressing to check my blood sugar"       Current Barriers:  . Financial . Limited hearing . Reluctance to take medication  Pharmacist Clinical Goal: Over the next 30 days,  patient will work with CM Pharmacist and PCP to address needs related to diabetes management and medication adherence  Interventions: . Follow up with Aaron Sosa regarding his diabetes medication management o Patient confirms taking his metformin dose to 2 tablets (1000 mg) QAM with breakfast and 1 tablet (500 mg) QPM with supper as directed o Review recent blood sugars (see below) o Counsel on continued importance of dietary choices in blood sugar control  . Follow up with patient regarding his blood pressure management  o Confirms taking lisinopril 10 mg once daily o Review recent blood pressure readings (see below) o Reports concern that he may need a new blood pressure monitor as his current monitor is ~81-49 years old. Encourage patient to obtain new upper arm monitor . Counsel on role of exercise in blood sugar and blood pressure management.  o Mr. Scaccia reports that his ability to exercise has been limited for some time due to joint pain. However, reports that he has started trying to walk around more in his yard - Today denies his pain being related to arthritis. Attributes his pain to side effects from medications that he has not taken in months-years. 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 Have encouraged patient to follow up with PCP about this joint pain . Discussed with patient at last visit the importance of cholesterol control, including impact of dietary choices.  o Patient had requested  further discussiion of restart of a statin medication at this visit. Aaron Sosa reports that he is not interested today  Patient Self Care Activities:   Patient making dietary modifications  Continuing to work on controlling portion size of carbohydrates  Patient to take medications as prescribed  Attends all scheduled provider appointments  Calls pharmacy for medication refills  Patient to check blood sugar  regularly and keep log Date Fasting Blood Glucose  10 - September 148  11 - September 138  12 - September 144  13 - September 132  14 - September 133  15 - September 134  16 - September 147  Average 139    Patient to check blood pressure daily and keep log Date Blood Pressure  7 - September 120/74, HR 68  8 - September   9 - September   10 - September   11 - September   12 - September   13 - September   14 - September   15 - September 114/61, HR 81  16 - September     Please see past updates related to this goal by clicking on the "Past Updates" button in the selected goal          Plan  Telephone follow up appointment with care management team member scheduled for: 9/30 at 1 pm  Harlow Asa, PharmD, [address] [phone]

## 2019-07-16 NOTE — Patient Instructions (Signed)
Thank you allowing the Chronic Care Management Team to be a part of your care! It was a pleasure speaking with you today!     CCM (Chronic Care Management) Team    Janci Minor RN, BSN Nurse Care Coordinator  [phone]   Harlow Asa PharmD  Clinical Pharmacist  [phone]   Eula Fried LCSW Clinical Social Worker [phone]  Visit Information  Goals Addressed            This Visit's Progress   . "It is depressing to check my blood sugar"       Current Barriers:  . Financial . Limited hearing . Reluctance to take medication  Pharmacist Clinical Goal: Over the next 30 days, patient will work with CM Pharmacist and PCP to address needs related to diabetes management and medication adherence  Interventions: . Follow up with Mr. Vanblarcom regarding his diabetes medication management o Patient confirms taking his metformin dose to 2 tablets (1000 mg) QAM with breakfast and 1 tablet (500 mg) QPM with supper as directed o Review recent blood sugars (see below) o Counsel on continued importance of dietary choices in blood sugar control  . Follow up with patient regarding his blood pressure management  o Confirms taking lisinopril 10 mg once daily o Review recent blood pressure readings (see below) o Reports concern that he may need a new blood pressure monitor as his current monitor is ~85-65 years old. Encourage patient to obtain new upper arm monitor . Counsel on role of exercise in blood sugar and blood pressure management.  o Mr. Loh reports that his ability to exercise has been limited for some time due to joint pain. However, reports that he has started trying to walk around more in his yard . Have encouraged patient to follow up with PCP about this joint pain . Discussed with patient at last visit the importance of cholesterol control, including impact of dietary choices.  o Patient had requested further discussion of restart of a statin medication at  this visit. Mr. Berrett reports that he is not interested today  Patient Self Care Activities:   Patient making dietary modifications  Continuing to work on controlling portion size of carbohydrates  Patient to take medications as prescribed  Attends all scheduled provider appointments  Calls pharmacy for medication refills  Patient to check blood sugar regularly and keep log Date Fasting Blood Glucose  10 - September 148  11 - September 138  12 - September 144  13 - September 132  14 - September 133  15 - September 134  16 - September 147  Average 139    Patient to check blood pressure daily and keep log Date Blood Pressure  7 - September 120/74, HR 68  8 - September   9 - September   10 - September   11 - September   12 - September   13 - September   14 - September   15 - September 114/61, HR 81  16 - September     Please see past updates related to this goal by clicking on the "Past Updates" button in the selected goal          The patient verbalized understanding of instructions provided today and declined a print copy of patient instruction materials.   Telephone follow up appointment with care management team member scheduled for: 9/30 at [place], PharmD, [place] [phone]

## 2019-07-30 ENCOUNTER — Ambulatory Visit (INDEPENDENT_AMBULATORY_CARE_PROVIDER_SITE_OTHER): Payer: PPO | Admitting: Pharmacist

## 2019-07-30 DIAGNOSIS — I10 Essential (primary) hypertension: Secondary | ICD-10-CM

## 2019-07-30 DIAGNOSIS — E1136 Type 2 diabetes mellitus with diabetic cataract: Secondary | ICD-10-CM

## 2019-07-30 NOTE — Chronic Care Management (AMB) (Addendum)
Chronic Care Management   Follow Up Note   07/30/2019 Name: SAXON Sosa MRN: [id number] DOB: 09-02-1941  Referred by: [name], DO Reason for referral : Chronic Care Management (Patient Phone Call)   Aaron Sosa is a 78 y.o. year old male who is a primary care patient of [name], DO. The CCM team was consulted for assistance with chronic disease management and care coordination needs.  Patient with a past medical history including but not limited to: hypertension, type 2 diabetes, hyperlipidemia, hearing loss of both ears, lower extremity edema and obesity.  I reached out to [name] by phone today.   Review of patient status, including review of consultants reports, relevant laboratory and other test results, and collaboration with appropriate care team members and the patient's provider was performed as part of comprehensive patient evaluation and provision of chronic care management services.    Outpatient Encounter Medications as of 07/30/2019  Medication Sig  . lisinopril (ZESTRIL) 10 MG tablet Take 1 tablet (10 mg total) by mouth daily.  . metFORMIN (GLUCOPHAGE) 500 MG tablet Take 2 tablets each morning with breakfast and 1 tablet each evening with supper  . acetaminophen (TYLENOL) 325 MG tablet Take by mouth.  . magnesium 30 MG tablet Take 30 mg by mouth once.  . Multiple Vitamin (MULTIVITAMIN) capsule Take 1 capsule by mouth daily.  Letta Pate ULTRA test strip Use to check blood sugar up to 2 x daily   No facility-administered encounter medications on file as of 07/30/2019.     Goals Addressed            This Visit's Progress   . PharmD - "It is depressing to check my blood sugar"       Current Barriers:  . Financial . Limited hearing . Reluctance to take medication  Pharmacist Clinical Goal: Over the next 30 days, patient will work with CM Pharmacist and PCP to address needs related to diabetes management and medication  adherence  Interventions: . Follow up with Mr. Bias regarding his diabetes medication management o Patient confirms taking his metformin as directed o Review recent blood sugars (see below) o Provide counseling regarding general diabetes disease state and on effect of diet on blood sugar control . Follow up with patient regarding his blood pressure management  o Confirms taking lisinopril 10 mg once daily o Review recent blood pressure readings (see below) o Reports that he obtained a new upper arm monitor . Counsel on role of exercise in blood sugar and blood pressure management.  o Mr. Xu reports he is continuing to trying to walk around more in his yard . Discussed with patient at last visit the importance of cholesterol control, including impact of dietary choices.  . Note patient due for next visit with PCP ~10/13. Will reach out to office regarding scheduling this appointment . Encourage patient to obtain influenza vaccine for 2020-2021 flu season.  o Patient expresses resistance due to limited ability of vaccine to cover all strains   Patient Self Care Activities:   Patient making dietary modifications  Continuing to work on controlling portion size of carbohydrates  Patient to take medications as prescribed  Attends all scheduled provider appointments  Calls pharmacy for medication refills  Patient to check blood sugar regularly and keep log Date Fasting Blood Glucose  24 - September 137  25 - September 147  26 - September 146  27 - September 148  28 - September 136  02 - September 162  30 - September 142  Average 145    Patient to check blood pressure daily and keep log Date Blood Pressure  20 - September 122/78, HR 82  21 - September   22 - September   23 - September   24 - September   25 - September   26 - September   27 - September   28 - September 127/89, HR 89  29 - September   30 - September 125/78, HR 85   Please see past updates related  to this goal by clicking on the "Past Updates" button in the selected goal          Plan  Telephone follow up appointment with care management team member scheduled for: 10/28 at 1 pm  Harlow Asa, PharmD, [place] [phone]

## 2019-07-30 NOTE — Patient Instructions (Signed)
Thank you allowing the Chronic Care Management Team to be a part of your care! It was a pleasure speaking with you today!     CCM (Chronic Care Management) Team    Janci Minor RN, BSN Nurse Care Coordinator  [phone]   Harlow Asa PharmD  Clinical Pharmacist  [phone]   Eula Fried LCSW Clinical Social Worker [phone]  Visit Information  Goals Addressed            This Visit's Progress   . PharmD - "It is depressing to check my blood sugar"       Current Barriers:  . Financial . Limited hearing . Reluctance to take medication  Pharmacist Clinical Goal: Over the next 30 days, patient will work with CM Pharmacist and PCP to address needs related to diabetes management and medication adherence  Interventions: . Follow up with Mr. Desilets regarding his diabetes medication management o Patient confirms taking his metformin as directed o Review recent blood sugars (see below) o Provide counseling regarding general diabetes disease state and on effect of diet on blood sugar control . Follow up with patient regarding his blood pressure management  o Confirms taking lisinopril 10 mg once daily o Review recent blood pressure readings (see below) o Reports that he obtained a new upper arm monitor . Counsel on role of exercise in blood sugar and blood pressure management.  o Mr. Enslin reports he is continuing to trying to walk around more in his yard . Discussed with patient at last visit the importance of cholesterol control, including impact of dietary choices.  . Note patient due for next visit with PCP ~10/13. Will reach out to office regarding scheduling this appointment . Encourage patient to obtain influeza vaccine for 2020-2021 flu season.  o Patient expresses resistance due to limited ability of vaccine to cover all strains   Patient Self Care Activities:   Patient making dietary modifications  Continuing to work on controlling portion size  of carbohydrates  Patient to take medications as prescribed  Attends all scheduled provider appointments  Calls pharmacy for medication refills  Patient to check blood sugar regularly and keep log Date Fasting Blood Glucose  24 - September 137  25 - September 147  26 - September 146  27 - September 148  28 - September 136  29 - September 162  30 - September 142  Average 145    Patient to check blood pressure daily and keep log Date Blood Pressure  20 - September 122/78, HR 82  21 - September   22 - September   23 - September   24 - September   25 - September   26 - September   27 - September   28 - September 127/89, HR 89  29 - September   30 - September 125/78, HR 85   Please see past updates related to this goal by clicking on the "Past Updates" button in the selected goal          The patient verbalized understanding of instructions provided today and declined a print copy of patient instruction materials.   Telephone follow up appointment with care management team member scheduled for: 10/28 at 1 pm  Harlow Asa, PharmD, [place] [phone]

## 2019-08-21 ENCOUNTER — Ambulatory Visit: Payer: Self-pay | Admitting: Licensed Clinical Social Worker

## 2019-08-21 ENCOUNTER — Telehealth: Payer: Self-pay

## 2019-08-21 NOTE — Chronic Care Management (AMB) (Addendum)
  Care Management   Follow Up Note   08/21/2019 Name: Aaron Sosa MRN: [id number] DOB: 1941-03-29  Referred by: Olin Hauser, DO Reason for referral : Care Coordination   Aaron Sosa is a 78 y.o. year old male who is a primary care patient of Olin Hauser, DO. The care management team was consulted for assistance with care management and care coordination needs.    Review of patient status, including review of consultants reports, relevant laboratory and other test results, and collaboration with appropriate care team members and the patient's provider was performed as part of comprehensive patient evaluation and provision of chronic care management services.    LCSW completed CCM outreach attempt today but was unable to reach patient successfully. A HIPPA compliant voice message was left encouraging patient to return call once available. LCSW rescheduled CCM SW appointment as well.  A HIPPA compliant phone message was left for the patient providing contact information and requesting a return call.   Eula Fried, BSW, MSW, [email or web address] Phone: [phone]

## 2019-08-27 ENCOUNTER — Ambulatory Visit (INDEPENDENT_AMBULATORY_CARE_PROVIDER_SITE_OTHER): Payer: PPO | Admitting: Pharmacist

## 2019-08-27 DIAGNOSIS — E1136 Type 2 diabetes mellitus with diabetic cataract: Secondary | ICD-10-CM

## 2019-08-27 DIAGNOSIS — I10 Essential (primary) hypertension: Secondary | ICD-10-CM | POA: Diagnosis not present

## 2019-08-27 NOTE — Chronic Care Management (AMB) (Signed)
Chronic Care Management   Follow Up Note   08/27/2019 Name: Aaron Sosa MRN: [id number] DOB: 09/02/1941  Referred by: Olin Hauser, DO Reason for referral : Chronic Care Management (Patient Phone Call)   Aaron Sosa is a 78 y.o. year old male who is a primary care patient of Olin Hauser, DO. The CCM team was consulted for assistance with chronic disease management and care coordination needs.  Patient with a past medical history including but not limited to: hypertension, type 2 diabetes, hyperlipidemia, hearing loss of both ears, lower extremity edema and obesity.  I reached out to Aaron Sosa by phone today.   Review of patient status, including review of consultants reports, relevant laboratory and other test results, and collaboration with appropriate care team members and the patient's provider was performed as part of comprehensive patient evaluation and provision of chronic care management services.     Outpatient Encounter Medications as of 08/27/2019  Medication Sig  . lisinopril (ZESTRIL) 10 MG tablet Take 1 tablet (10 mg total) by mouth daily.  . metFORMIN (GLUCOPHAGE) 500 MG tablet Take 2 tablets each morning with breakfast and 1 tablet each evening with supper  . acetaminophen (TYLENOL) 325 MG tablet Take by mouth.  . magnesium 30 MG tablet Take 30 mg by mouth once.  . Multiple Vitamin (MULTIVITAMIN) capsule Take 1 capsule by mouth daily.  Aaron Sosa ULTRA test strip Use to check blood sugar up to 2 x daily   No facility-administered encounter medications on file as of 08/27/2019.     Goals Addressed            This Visit's Progress   . PharmD - "It is depressing to check my blood sugar"       Current Barriers:  . Financial . Limited hearing . Reluctance to take medication  Pharmacist Clinical Goal: Over the next 30 days, patient will work with CM Pharmacist and PCP to address needs related to diabetes management and medication  adherence  Interventions: . Follow up with Aaron Sosa regarding his diabetes medication management o Patient confirms taking his metformin as directed o Review recent blood sugars (see below) o Provide counseling regarding general diabetes disease state and on effect of diet on blood sugar control . Follow up with patient regarding his blood pressure management  o Confirms taking lisinopril 10 mg once daily o Review recent blood pressure readings (see below) o Aaron Sosa states that he wants to try stopping his lisinopril to see if it could be causing his joint (wrists) pain - Advise patient against self-discontinuing his medication. . Again, encourage patient to see PCP for evaluation of joint pain . Coordination of care: Have previously advised patient to call office to schedule visit with PCP for both diabetes follow up and to discuss ongoing joint pain o Office staff has also attempted to reach patient to schedule this visit o Schedule face to face visit with PCP for patient now while on the phone o Remind patient of importance of COVID-19 precautions when visiting office. Aaron Sosa confirms that he will wear a mask to his visit.  Patient Self Care Activities:   Patient making dietary modifications  Continuing to work on controlling portion size of carbohydrates  Patient to take medications as prescribed  Attends all scheduled provider appointments  Calls pharmacy for medication refills  Patient to check blood sugar regularly and keep log Date Fasting Blood Glucose  22 - October 151  23 -  October 134  24 - October 137  25 - October 147  26 - October 146  27 - October 144  28 - October 162  Average 146    Patient to check blood pressure daily and keep log Date Blood Pressure  21 - October 115/73, HR 93  22 - October   23 - October   24 - October   25 - October   26 - October 124/78, HR 88  27 - October   28 - October 123/83, HR 93    Please see past  updates related to this goal by clicking on the "Past Updates" button in the selected goal          Plan  Telephone follow up appointment with care management team member scheduled for:11/23 at 1:30 pm Next PCP appointment scheduled for:  11/4 at 2:20 pm  Aaron Sosa, PharmD, [facility] Clinical Pharmacist [facility] [phone]

## 2019-08-27 NOTE — Patient Instructions (Addendum)
Thank you allowing the Chronic Care Management Team to be a part of your care! It was a pleasure speaking with you today!     CCM (Chronic Care Management) Team    Janci Minor RN, BSN Nurse Care Coordinator  [phone]   Harlow Asa PharmD  Clinical Pharmacist  [phone]   Eula Fried LCSW Clinical Social Worker [phone]  Visit Information  Goals Addressed            This Visit's Progress   . PharmD - "It is depressing to check my blood sugar"       Current Barriers:  . Financial . Limited hearing . Reluctance to take medication  Pharmacist Clinical Goal: Over the next 30 days, patient will work with CM Pharmacist and PCP to address needs related to diabetes management and medication adherence  Interventions: . Follow up with Mr. Warnell regarding his diabetes medication management o Patient confirms taking his metformin as directed o Review recent blood sugars (see below) o Provide counseling regarding general diabetes disease state and on effect of diet on blood sugar control . Follow up with patient regarding his blood pressure management  o Confirms taking lisinopril 10 mg once daily o Review recent blood pressure readings (see below) o Mr. Villamizar states that he wants to try stopping his lisinopril to see if it could be causing his joint (wrists) pain - Advise patient against self-discontinuing his medication. . Again, encourage patient to see PCP for evaluation of joint pain o Coordination of care: Schedule face to face visit with PCP for patient now while on the phone o Remind patient of importance of COVID-19 precautions when visiting office. Mr. Bond confirms that he will wear a mask to his visit.  Patient Self Care Activities:   Patient making dietary modifications  Continuing to work on controlling portion size of carbohydrates  Patient to take medications as prescribed  Attends all scheduled provider appointments  Calls  pharmacy for medication refills  Patient to check blood sugar regularly and keep log Date Fasting Blood Glucose  22 - October 151  23 - October 134  24 - October 137  25 - October 147  26 - October 146  27 - October 144  28 - October 162  Average 146    Patient to check blood pressure daily and keep log Date Blood Pressure  21 - October 115/73, HR 93  22 - October   23 - October   24 - October   25 - October   26 - October 124/78, HR 88  27 - October   28 - October 123/83, HR 93    Please see past updates related to this goal by clicking on the "Past Updates" button in the selected goal          The patient verbalized understanding of instructions provided today and declined a print copy of patient instruction materials.   Telephone follow up appointment with care management team member scheduled for: 11/23 at 1:30 pm  Harlow Asa, PharmD, [place] [phone]

## 2019-09-03 ENCOUNTER — Other Ambulatory Visit: Payer: Self-pay | Admitting: Family Medicine

## 2019-09-03 ENCOUNTER — Other Ambulatory Visit: Payer: Self-pay

## 2019-09-03 ENCOUNTER — Encounter: Payer: Self-pay | Admitting: Family Medicine

## 2019-09-03 ENCOUNTER — Ambulatory Visit (INDEPENDENT_AMBULATORY_CARE_PROVIDER_SITE_OTHER): Payer: PPO | Admitting: Family Medicine

## 2019-09-03 VITALS — BP 144/80 | HR 89 | Temp 99.0°F | Resp 16 | Ht 68.0 in | Wt 227.0 lb

## 2019-09-03 DIAGNOSIS — E1136 Type 2 diabetes mellitus with diabetic cataract: Secondary | ICD-10-CM

## 2019-09-03 DIAGNOSIS — E669 Obesity, unspecified: Secondary | ICD-10-CM

## 2019-09-03 DIAGNOSIS — R351 Nocturia: Secondary | ICD-10-CM

## 2019-09-03 DIAGNOSIS — I10 Essential (primary) hypertension: Secondary | ICD-10-CM | POA: Diagnosis not present

## 2019-09-03 DIAGNOSIS — E1169 Type 2 diabetes mellitus with other specified complication: Secondary | ICD-10-CM

## 2019-09-03 DIAGNOSIS — Z0000 Encounter for general adult medical examination without abnormal findings: Secondary | ICD-10-CM

## 2019-09-03 LAB — POCT GLYCOSYLATED HEMOGLOBIN (HGB A1C): Hemoglobin A1C: 8 % — AB (ref 4.0–5.6)

## 2019-09-03 NOTE — Assessment & Plan Note (Signed)
Elevated BP currently - previous home readings had been normal. Recheck improve but still elevated No known complications     Plan:  1. DISCONTINUE Lisinopril 10mg  - given potential side effect that has now resolved after he stopped med, not entirely consistent with arthritis symptoms, otherwise limited explanation - discussed MSK was more common cause, but now that he is monitoring BP we can see how his BP does off medicine since he has improved lifestyle. If still elevated over next 2-4 weeks >140/90, on home readings - will correspond with CCM Pharmacy - then he agrees to try new med - likely low dose Amlodipine 5-10mg  2. Encourage improved lifestyle - low sodium diet, regular exercise 3. Continue monitor BP outside office, bring readings to next visit, if persistently >140/90 or new symptoms notify office sooner 4. Follow-up 3 months labs/physical  Will forward chart to [place] for review and BP follow-up

## 2019-09-03 NOTE — Assessment & Plan Note (Signed)
Significantly improved DM down to 8 (from prior 11-12) Overall has shown improved CBG control and improved med adherence No hypoglycemia Complications - hyperglycemia, retinopathy, cataracts bilateral (s/p surgery), other including hyperlipidemia specifically obesity - increases risk of future cardiovascular complications  Failed  Glipizide (side effect), Ozempic (unable to use), Trulicity (in thigh, muscle ache and weak), Jardiance (joint ache)  Plan:  1. Continue Metformin IR 500mg  - take 2 am / 1 pm  2. Encourage improved lifestyle - low carb, low sugar diet, reduce portion size, continue improving regular exercise 3. OFF Lisinopril now see A&P - we can consider monitoring urine microalbumin in future 4. Failed statin due to myalgia - discontinued 5. DM Eye - retinopathy  Follow-up with CCM Pharmacy in future, keep working on med adherence  Future consider DPP4 Januvia as option to add to Metformin if needed. For now we agree to focus on BP given issue with lisinopril - then if needed we will return to adjusting DM medication if indicated.

## 2019-09-03 NOTE — Progress Notes (Signed)
Subjective:    Patient ID: Aaron Sosa, male    DOB: 05-27-1941, 78 y.o.   MRN: [id number]  Aaron Sosa is a 78 y.o. male presenting on 09/03/2019 for Diabetes and Joint Pain   HPI   CHRONIC DM, Type 2 - Last visit with me 04/2019, for diabetes. Also reviewed CCM pharmacy documentation. He has been on variety of Diabetic medications over past year including Jardiance, Trulicity, Ozempic due to side effects etc. - He has resumed Metformin IR 500mg  x 2 in AM and x 1 in PM, monotherapy now and had prior A1c >11-12. Now today result of A1c shows 8.0 with significant improvement. He attributes this to improved diet and medication metformin. He eats reduced calories and portions. - He continues to follow by phone with Aaron Sosa [facility] for Chronic Care Management team CBGs:Reviewed last CBG logs Meds:NONE currently - self discontinued Jardiance Previously on ACEi Lifestyle: - Diet (Stilltrying to improve diet) - Exercise (walking regularly 6-7k daily at home) Denies hypoglycemia, polyuria, visual changes, numbness or tingling.  CHRONIC HTN: Reports recent issue with Lisinopril medication, he had side effect by his report with sharp pains in wrist and shoulders, said he took that med for a while, few year, now the dose was changed in past from 2.5 up to 10mg , and he did not tolerate that well the new dose had given him significant side effects. 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 Now has mostly resolved Current Meds - None (currently off Lisinopril 10mg )   Not been on CCB Amlodipine in past based on chart review. But he has taken thiazide in past. Denies CP, dyspnea, HA, edema, dizziness / lightheadedness   Health Maintenance: Due for Flu Shot, declines today despite counseling on benefits   Depression screen [facility]  2/9 09/03/2019 09/24/2018 08/14/2018  Decreased Interest 0 0 0  Down, Depressed, Hopeless 0 0 0  PHQ - 2 Score 0 0 0    Social History   Tobacco Use  . Smoking status: Former Smoker    Packs/day: 2.00    Years: 5.00    Pack years: 10.00    Types: Cigarettes  . Smokeless tobacco: Former Network engineer Use Topics  . Alcohol use: Yes    Alcohol/week: 1.0 standard drinks    Types: 1 Cans of beer per week  . Drug use: Never    Review of Systems Per HPI unless specifically indicated above     Objective:    BP (!) 144/80 (BP Location: Left Arm, Cuff Size: Normal)   Pulse 89   Temp 99 F (37.2 C)   Resp 16   Ht 5\' 8"  (1.727 m)   Wt 227 lb (103 kg)   BMI 34.52 kg/m   Wt Readings from Last 3 Encounters:  09/03/19 227 lb (103 kg)  12/30/18 230 lb (104.3 kg)  09/24/18 230 lb (104.3 kg)    Physical Exam Vitals signs and nursing note reviewed.  Constitutional:      General: He is not in acute distress.    Appearance: He is well-developed. He is not diaphoretic.     Comments: Well-appearing, comfortable, cooperative  HENT:     Head: Normocephalic and atraumatic.     Comments: Hard of hearing Eyes:     General:  Right eye: No discharge.        Left eye: No discharge.     Conjunctiva/sclera: Conjunctivae normal.  Neck:     Musculoskeletal: Normal range of motion and neck supple.     Thyroid: No thyromegaly.  Cardiovascular:     Rate and Rhythm: Normal rate and regular rhythm.     Heart sounds: Normal heart sounds. No murmur.  Pulmonary:     Effort: Pulmonary effort is normal. No respiratory distress.     Breath sounds: Normal breath sounds. No wheezing or rales.  Musculoskeletal: Normal range of motion.  Lymphadenopathy:     Cervical: No cervical adenopathy.  Skin:    General: Skin is warm and dry.     Findings: No erythema or rash.  Neurological:     Mental Status: He is alert and oriented to person, place, and time.  Psychiatric:        Behavior:  Behavior normal.     Comments: Well groomed, good eye contact, normal speech and thoughts      Diabetic Foot Exam - Simple   Simple Foot Form Diabetic Foot exam was performed with the following findings: Yes 09/03/2019  2:58 PM  Visual Inspection See comments: Yes Sensation Testing Intact to touch and monofilament testing bilaterally: Yes Pulse Check Posterior Tibialis and Dorsalis pulse intact bilaterally: Yes Comments Bilateral toes with thickened great toenails some slight foot deformity, callus formation without ulceration.      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 Continue Metformin IR 500mg  - take 2 am / 1 pm  2. Encourage improved lifestyle - low carb, low sugar diet, reduce portion size, continue improving regular exercise 3. OFF Lisinopril now see A&P - we can consider monitoring urine microalbumin in future 4. Failed statin due to myalgia - discontinued 5. DM Eye - retinopathy  Follow-up with CCM Pharmacy in future, keep working on med adherence  Future consider DPP4 Januvia as option to add to Metformin if needed. For now we agree to focus on BP given issue with lisinopril - then if needed we  will return to adjusting DM medication if indicated.      Relevant Orders   POCT HgB A1C (Completed)   Obesity (BMI 30.0-34.9)   Essential hypertension    Elevated BP currently - previous home readings had been normal. Recheck improve but still elevated No known complications     Plan:  1. DISCONTINUE Lisinopril 10mg  - given potential side effect that has now resolved after he stopped med, not entirely consistent with arthritis symptoms, otherwise limited explanation - discussed MSK was more common cause, but now that he is monitoring BP we can see how his BP does  off medicine since he has improved lifestyle. If still elevated over next 2-4 weeks >140/90, on home readings - will correspond with [facility] Pharmacy - then he agrees to try new med - likely low dose Amlodipine 5-10mg  2. Encourage improved lifestyle - low sodium diet, regular exercise 3. Continue monitor BP outside office, bring readings to next visit, if persistently >140/90 or new symptoms notify office sooner 4. Follow-up 3 months labs/physical  Will forward chart to [facility] for review and BP follow-up         No orders of the defined types were placed in this encounter.   Follow up plan: Return in about 3 months (around 12/04/2019) for Annual Physical.  Future labs ordered for 12/01/19  Saralyn Pilar, DO [name] 09/03/2019, 2:52 PM

## 2019-09-03 NOTE — Patient Instructions (Addendum)
Thank you for coming to the office today.  Remain OFF Lisinopril 10mg  for now - we will have you check your BP more often at home, write down readings, [facility] can contact you in next few weeks, to review readings. If elevated >140/90 then we can offer a new BP medication that is different from this med that caused you side effect.  I am not too familiar with this side effect, I don't see any other explanation of your current symptom.  Recent Labs    09/17/18 0834 12/30/18 0934 09/03/19 1456  HGBA1C 12.2* 11.8* 8.0*     DUE for FASTING BLOOD WORK (no food or drink after midnight before the lab appointment, only water or coffee without cream/sugar on the morning of)  SCHEDULE "Lab Only" visit in the morning at the clinic for lab draw in 3 MONTHS   - Make sure Lab Only appointment is at about 1 week before your next appointment, so that results will be available  For Lab Results, once available within 2-3 days of blood draw, you can can log in to MyChart online to view your results and a brief explanation. Also, we can discuss results at next follow-up visit.   Please schedule a Follow-up Appointment to: Return in about 3 months (around 12/04/2019) for Annual Physical.  If you have any other questions or concerns, please feel free to call the office or send a message through [place]. You may also schedule an earlier appointment if necessary.  Additionally, you may be receiving a survey about your experience at our office within a few days to 1 week by e-mail or mail. We value your feedback.  Aaron Putnam, DO [place]

## 2019-09-12 ENCOUNTER — Ambulatory Visit: Payer: Self-pay | Admitting: Pharmacist

## 2019-09-12 ENCOUNTER — Telehealth: Payer: Self-pay

## 2019-09-12 NOTE — Chronic Care Management (AMB) (Signed)
  Chronic Care Management   Follow Up Note   09/12/2019 Name: Aaron Sosa MRN: [id number] DOB: 13-Nov-1940  Referred by: Olin Hauser, DO Reason for referral : Chronic Care Management (Patient Phone Call)   Aaron Sosa is a 78 y.o. year old male who is a primary care patient of Olin Hauser, DO. The CCM team was consulted for assistance with chronic disease management and care coordination needs.  Patient with a past medical history including but not limited to: hypertension, type 2 diabetes, hyperlipidemia, hearing loss of both ears, lower extremity edema and obesity.  Was unable to reach patient via telephone today and have left HIPAA compliant voicemail asking patient to return my call.  Plan  The care management team will reach out to the patient again over the next 14 days.   Harlow Asa, PharmD, [place] [employer] [phone]

## 2019-09-15 ENCOUNTER — Ambulatory Visit: Payer: Self-pay | Admitting: Pharmacist

## 2019-09-15 ENCOUNTER — Telehealth: Payer: Self-pay

## 2019-09-15 DIAGNOSIS — I10 Essential (primary) hypertension: Secondary | ICD-10-CM

## 2019-09-15 NOTE — Patient Instructions (Signed)
Thank you allowing the Chronic Care Management Team to be a part of your care! It was a pleasure speaking with you today!     CCM (Chronic Care Management) Team    Janci Minor RN, BSN Nurse Care Coordinator  [phone]   Harlow Asa PharmD  Clinical Pharmacist  [phone]   Eula Fried LCSW Clinical Social Worker [phone]  Visit Information  Goals Addressed            This Visit's Progress   . PharmD - "It is depressing to check my blood sugar"       Current Barriers:  . Financial . Limited hearing . Reluctance to take medication  Pharmacist Clinical Goal: Over the next 30 days, patient will work with CM Pharmacist and PCP to address needs related to diabetes management and medication adherence  Interventions: . Unable to reach Mr. Gosnell by phone . Follow up with patient's daughter, Darla Lesches o Ms. Baker messages with patient. Patient denies having checked his BP since last appointment with PCP on 11/4 o Encourage patient restart checking BP o Schedule next telephone outreach call to patient  Patient Self Care Activities:   Patient making dietary modifications  Patient to take medications as prescribed  Attends all scheduled provider appointments  Calls pharmacy for medication refills  Patient to check blood sugar regularly and keep log  Patient to check blood pressure daily and keep log   Please see past updates related to this goal by clicking on the "Past Updates" button in the selected goal          The patient verbalized understanding of instructions provided today and declined a print copy of patient instruction materials.   Telephone follow up appointment with care management team member scheduled for: 12/4 at 2 pm  Harlow Asa, PharmD, [place] [phone]

## 2019-09-15 NOTE — Chronic Care Management (AMB) (Signed)
  Chronic Care Management   Follow Up Note   09/15/2019 Name: Aaron Sosa MRN: [id number] DOB: Jun 14, 1941  Referred by: Olin Hauser, DO Reason for referral : Chronic Care Management (Patient Phone Call)   Aaron Sosa is a 78 y.o. year old male who is a primary care patient of Olin Hauser, DO. The CCM team was consulted for assistance with chronic disease management and care coordination needs.  Patient with a past medical history including but not limited to: hypertension, type 2 diabetes, hyperlipidemia, hearing loss of both ears, lower extremity edema and obesity.  Was unable to reach patient via telephone today and have left HIPAA compliant voicemail asking patient to return my call (outreach attempt #2). Also call and speak with patient's daughter, Aaron Sosa, who is listed on patient's designated party release in chart.  Review of patient status, including review of consultants reports, relevant laboratory and other test results, and collaboration with appropriate care team members and the patient's provider was performed as part of comprehensive patient evaluation and provision of chronic care management services.    Outpatient Encounter Medications as of 09/15/2019  Medication Sig  . acetaminophen (TYLENOL) 325 MG tablet Take by mouth.  . magnesium 30 MG tablet Take 30 mg by mouth once.  . metFORMIN (GLUCOPHAGE) 500 MG tablet Take 2 tablets each morning with breakfast and 1 tablet each evening with supper  . Multiple Vitamin (MULTIVITAMIN) capsule Take 1 capsule by mouth daily.  [name] ULTRA test strip Use to check blood sugar up to 2 x daily   No facility-administered encounter medications on file as of 09/15/2019.     Goals Addressed            This Visit's Progress   . PharmD - "It is depressing to check my blood sugar"       Current Barriers:  . Financial . Limited hearing . Reluctance to take medication  Pharmacist Clinical Goal: Over  the next 30 days, patient will work with CM Pharmacist and PCP to address needs related to diabetes management and medication adherence  Interventions: . Unable to reach Mr. Pernell by phone . Follow up with patient's daughter, Aaron Sosa o Ms. Baker messages with patient. Patient denies having checked his BP since last appointment with PCP on 11/4 o Encourage patient restart checking BP o Schedule next telephone outreach call to patient  Patient Self Care Activities:   Patient making dietary modifications  Patient to take medications as prescribed  Attends all scheduled provider appointments  Calls pharmacy for medication refills  Patient to check blood sugar regularly and keep log  Patient to check blood pressure daily and keep log   Please see past updates related to this goal by clicking on the "Past Updates" button in the selected goal          Plan  Telephone follow up appointment with care management team member scheduled for: 12/4 at 2 pm  Harlow Asa, PharmD, [place] [phone]

## 2019-09-22 ENCOUNTER — Telehealth: Payer: Self-pay

## 2019-10-03 ENCOUNTER — Ambulatory Visit (INDEPENDENT_AMBULATORY_CARE_PROVIDER_SITE_OTHER): Payer: PPO | Admitting: Pharmacist

## 2019-10-03 DIAGNOSIS — E1136 Type 2 diabetes mellitus with diabetic cataract: Secondary | ICD-10-CM | POA: Diagnosis not present

## 2019-10-03 DIAGNOSIS — I10 Essential (primary) hypertension: Secondary | ICD-10-CM | POA: Diagnosis not present

## 2019-10-03 NOTE — Chronic Care Management (AMB) (Signed)
Chronic Care Management   Follow Up Note   10/03/2019 Name: ZAKARY KIMURA MRN: [id number] DOB: 1941/07/17  Referred by: [name], DO Reason for referral : Chronic Care Management (Patient Phone Call)   BREVIN MCFADDEN is a 78 y.o. year old male who is a primary care patient of [name], DO. The CCM team was consulted for assistance with chronic disease management and care coordination needs.  Patient with a past medical history including but not limited to: hypertension, type 2 diabetes, hyperlipidemia, hearing loss of both ears, lower extremity edema and obesity.  I reached out to [name] by phone today.   Review of patient status, including review of consultants reports, relevant laboratory and other test results, and collaboration with appropriate care team members and the patient's provider was performed as part of comprehensive patient evaluation and provision of chronic care management services.     Outpatient Encounter Medications as of 10/03/2019  Medication Sig  . metFORMIN (GLUCOPHAGE) 500 MG tablet Take 2 tablets each morning with breakfast and 1 tablet each evening with supper (Patient taking differently: 500 mg 2 (two) times daily with a meal. Take 2 tablets each morning with breakfast and 1 tablet each evening with supper )  . acetaminophen (TYLENOL) 325 MG tablet Take by mouth.  . magnesium 30 MG tablet Take 30 mg by mouth once.  . Multiple Vitamin (MULTIVITAMIN) capsule Take 1 capsule by mouth daily.  Letta Pate ULTRA test strip Use to check blood sugar up to 2 x daily   No facility-administered encounter medications on file as of 10/03/2019.     Goals Addressed            This Visit's Progress   . PharmD - "It is depressing to check my blood sugar"       Current Barriers:  . Financial . Limited hearing . Reluctance to take medication  Pharmacist Clinical Goal: Over the next 30 days, patient will work with CM Pharmacist and PCP  to address needs related to diabetes management and medication adherence  Interventions:  Follow up with patient regarding his blood pressure management  ? Note patient currently off of blood pressure medicine ? Review recent blood pressure readings (see below) ? Reports sharp pains in wrist improved since stopping lisinopril.  Reports had a fall since we last spoke, but unable to tell me when it occurred.  ? States he was sitting in a chair, reached for an item on the floor and fell from the chair in trying to pick it up. ? Denies dizziness related to fall. States he was trying to pick up something that he knew he shouldn't ? Reports fell on right side and had pain in right shoulder and arm  ? Denies having been seen for medical care since fall ? Encourage patient to see PCP about fall/pain. Patient declines ? States pain greatly improved today ? Patient denies interest in having life alert or similar safety device  Follow up with Mr. Vigen regarding his diabetes medication management ? Patient reports self-decreased his metformin dose, currently taking 500 mg twice daily, as he wondered if this medication could also be causing his wrist pain. ? Review recent blood sugars (see below) ? Patient attributes his current blood sugar control to diet/limiting carbohydrate portion size  Patient Self Care Activities:   Patient making dietary modifications  Patient to take medications as prescribed  Attends all scheduled provider appointments  Calls pharmacy for medication refills  Patient to check blood sugar regularly and keep log Date Fasting Blood Glucose  28 - November 135  29 - November 132  30 - November 119  1 - December 126  2 - December 134  3 - December 133  4 - December 126  Average 129    Patient to check blood pressure daily and keep log Date BP  30 - November 122/78, HR 86  1 - December -  2 - December 115/74, HR 84  3 - December 114/74, HR 79  4 - December  -    Please see past updates related to this goal by clicking on the "Past Updates" button in the selected goal          Plan  Telephone follow up appointment with care management team member scheduled for: 12/18 at 1 pm  Harlow Asa, PharmD, [place] [phone]

## 2019-10-03 NOTE — Patient Instructions (Signed)
Thank you allowing the Chronic Care Management Team to be a part of your care! It was a pleasure speaking with you today!     CCM (Chronic Care Management) Team    Janci Minor RN, BSN Nurse Care Coordinator  [phone]   Harlow Asa PharmD  Clinical Pharmacist  [phone]   Eula Fried LCSW Clinical Social Worker [phone]  Visit Information  Goals Addressed            This Visit's Progress   . PharmD - "It is depressing to check my blood sugar"       Current Barriers:  . Financial . Limited hearing . Reluctance to take medication  Pharmacist Clinical Goal: Over the next 30 days, patient will work with CM Pharmacist and PCP to address needs related to diabetes management and medication adherence  Interventions:  Follow up with patient regarding his blood pressure management  ? Note patient currently off of blood pressure medicine ? Review recent blood pressure readings (see below) ? Reports sharp pains in wrist improved since stopping lisinopril.  Reports had a fall since we last spoke, but unable to tell me when it occurred.  ? States he was sitting in a chair, reached for an item on the floor and fell from the chair in trying to pick it up. ? Denies dizziness related to fall. States he was trying to pick up something that he knew he shouldn't ? Reports fell on right side and had pain in right shoulder and arm  ? Denies having been seen for medical care since fall ? Encourage patient to see PCP about fall/pain. Patient declines ? States pain greatly improved today ? Patient denies interest in having life alert or similar safety device  Follow up with Mr. Demetro regarding his diabetes medication management ? Patient reports self-decreased his metformin dose, currently taking 500 mg twice daily, as he wondered if this medication could also be causing his wrist pain. ? Review recent blood sugars (see below) ? Patient attributes his current blood  sugar control to diet/limiting carbohydrate portion size  Patient Self Care Activities:   Patient making dietary modifications  Patient to take medications as prescribed  Attends all scheduled provider appointments  Calls pharmacy for medication refills  Patient to check blood sugar regularly and keep log Date Fasting Blood Glucose  28 - November 135  29 - November 132  30 - November 119  1 - December 126  2 - December 134  3 - December 133  4 - December 126  Average 129    Patient to check blood pressure daily and keep log Date BP  30 - November 122/78, HR 86  1 - December -  2 - December 115/74, HR 84  3 - December 114/74, HR 79  4 - December -    Please see past updates related to this goal by clicking on the "Past Updates" button in the selected goal          The patient verbalized understanding of instructions provided today and declined a print copy of patient instruction materials.   Telephone follow up appointment with care management team member scheduled for: 12/18 at 1 pm  Harlow Asa, PharmD, [place] [phone]

## 2019-10-16 ENCOUNTER — Ambulatory Visit: Payer: Self-pay | Admitting: Licensed Clinical Social Worker

## 2019-10-16 ENCOUNTER — Telehealth: Payer: Self-pay

## 2019-10-16 NOTE — Chronic Care Management (AMB) (Signed)
  Care Management   Follow Up Note   10/16/2019 Name: Aaron Sosa MRN: [id number] DOB: 03-Jun-1941  Referred by: Olin Hauser, DO Reason for referral : Chronic Care Management   Aaron Sosa is a 78 y.o. year old male who is a primary care patient of Olin Hauser, DO. The care management team was consulted for assistance with care management and care coordination needs.    Review of patient status, including review of consultants reports, relevant laboratory and other test results, and collaboration with appropriate care team members and the patient's provider was performed as part of comprehensive patient evaluation and provision of chronic care management services.   Marland Kitchen LCSW completed CCM outreach attempt today but was unable to reach patient successfully. A HIPPA compliant voice message was left encouraging patient to return call once available. LCSW rescheduled CCM SW appointment as well.  A HIPPA compliant phone message was left for the patient providing contact information and requesting a return call.   Eula Fried, BSW, MSW, Aaron Sosa Phone: [phone]

## 2019-10-17 ENCOUNTER — Ambulatory Visit: Payer: Self-pay | Admitting: Pharmacist

## 2019-10-17 DIAGNOSIS — I10 Essential (primary) hypertension: Secondary | ICD-10-CM

## 2019-10-17 DIAGNOSIS — E1136 Type 2 diabetes mellitus with diabetic cataract: Secondary | ICD-10-CM

## 2019-10-17 NOTE — Chronic Care Management (AMB) (Signed)
  Chronic Care Management   Follow Up Note   10/17/2019 Name: Aaron Sosa MRN: [id number] DOB: 13-Feb-1941  Referred by: Olin Hauser, DO Reason for referral : Chronic Care Management (Patient Phone Call)   Aaron Sosa is a 78 y.o. year old male who is a primary care patient of Olin Hauser, DO. The CCM team was consulted for assistance with chronic disease management and care coordination needs.  Patient with a past medical history including but not limited to: hypertension, type 2 diabetes, hyperlipidemia, hearing loss of both ears, lower extremity edema and obesity.  I reached out to Aaron Sosa by phone today.   Review of patient status, including review of consultants reports, relevant laboratory and other test results, and collaboration with appropriate care team members and the patient's provider was performed as part of comprehensive patient evaluation and provision of chronic care management services.    Outpatient Encounter Medications as of 10/17/2019  Medication Sig  . metFORMIN (GLUCOPHAGE) 500 MG tablet Take 2 tablets each morning with breakfast and 1 tablet each evening with supper (Patient taking differently: 500 mg 2 (two) times daily with a meal. Take 2 tablets each morning with breakfast and 1 tablet each evening with supper )  . acetaminophen (TYLENOL) 325 MG tablet Take by mouth.  . magnesium 30 MG tablet Take 30 mg by mouth once.  . Multiple Vitamin (MULTIVITAMIN) capsule Take 1 capsule by mouth daily.  [name] ULTRA test strip Use to check blood sugar up to 2 x daily   No facility-administered encounter medications on file as of 10/17/2019.    Goals Addressed   Current Barriers:  . Financial . Limited hearing . Reluctance to take medication  Pharmacist Clinical Goal: Over the next 30 days, patient will work with CM Pharmacist and PCP to address needs related to diabetes management and medication  adherence  Interventions:  Follow up with patient regarding his blood pressure management  ? Note patient currently off of blood pressure medicine ? Review recent blood pressure readings (see below)  Follow up with [name] Sosa regarding his diabetes medication management ? Patient reports taking: metformin 500 mg twice daily ? Review recent blood sugars (see below)  Discuss importance of having well-balanced diet and control of carbohydrate portion size.  Provide verbal education on reading and interpreting nutrition labels  Patient Self Care Activities:   Patient making dietary modifications  Patient to take medications as prescribed  Attends all scheduled provider appointments  Calls pharmacy for medication refills  Patient to check blood sugar regularly and keep log Date Fasting Blood Glucose  12 - December 127  13 - December 158  14 - December 143  15 - December 118  16 - December 136  17 - December 136  18 - December 142  Average 137    Patient to check blood pressure daily and keep log Date BP  14 - December 120/71, HR 79  15 - December   16 - December 129/72, HR 75  17 - December   18 - December 131/79, HR 71    Please see past updates related to this goal by clicking on the "Past Updates" button in the selected goal       Plan  Telephone follow up appointment with care management team member scheduled for: 1/15 at 1:30 pm  Harlow Asa, PharmD, [place] [phone]

## 2019-10-17 NOTE — Patient Instructions (Addendum)
Thank you allowing the Chronic Care Management Team to be a part of your care! It was a pleasure speaking with you today!     CCM (Chronic Care Management) Team    Janci Minor RN, BSN Nurse Care Coordinator  [phone]   Harlow Asa PharmD  Clinical Pharmacist  ([place] LCSW Clinical Social Worker [phone]  Visit Information  Goals Addressed   Current Barriers:  . Financial . Limited hearing . Reluctance to take medication  Pharmacist Clinical Goal: Over the next 30 days, patient will work with CM Pharmacist and PCP to address needs related to diabetes management and medication adherence  Interventions:  Follow up with patient regarding his blood pressure management  ? Note patient currently off of blood pressure medicine ? Review recent blood pressure readings (see below)  Follow up with Mr. Hagood regarding his diabetes medication management ? Patient reports taking: metformin 500 mg twice daily ? Review recent blood sugars (see below)  Discuss importance of having well-balanced diet and control of carbohydrate portion size.  Provide verbal education on reading and interpreting nutrition labels  Patient Self Care Activities:   Patient making dietary modifications  Patient to take medications as prescribed  Attends all scheduled provider appointments  Calls pharmacy for medication refills  Patient to check blood sugar regularly and keep log Date Fasting Blood Glucose  12 - December 127  13 - December 158  14 - December 143  15 - December 118  16 - December 136  17 - December 136  18 - December 142  Average 137    Patient to check blood pressure daily and keep log Date BP  14 - December 120/71, HR 79  15 - December   16 - December 129/72, HR 75  17 - December   18 - December 131/79, HR 71    Please see past updates related to this goal by clicking on the "Past Updates" button in the selected goal        The patient verbalized understanding of instructions provided today and declined a print copy of patient instruction materials.   Telephone follow up appointment with care management team member scheduled for: 1/15 at 1:30pm  Harlow Asa, PharmD, [place] [phone]

## 2019-10-20 ENCOUNTER — Other Ambulatory Visit: Payer: Self-pay | Admitting: Family Medicine

## 2019-10-20 DIAGNOSIS — E1136 Type 2 diabetes mellitus with diabetic cataract: Secondary | ICD-10-CM

## 2019-11-13 ENCOUNTER — Ambulatory Visit (INDEPENDENT_AMBULATORY_CARE_PROVIDER_SITE_OTHER): Payer: PPO | Admitting: Pharmacist

## 2019-11-13 DIAGNOSIS — E1136 Type 2 diabetes mellitus with diabetic cataract: Secondary | ICD-10-CM | POA: Diagnosis not present

## 2019-11-13 DIAGNOSIS — I10 Essential (primary) hypertension: Secondary | ICD-10-CM

## 2019-11-13 NOTE — Chronic Care Management (AMB) (Signed)
Chronic Care Management   Follow Up Note   11/13/2019 Name: Aaron Sosa MRN: [id number] DOB: 1941-06-18  Referred by: [name], DO Reason for referral : Chronic Care Management (Patient Phone Call)   Aaron Sosa is a 79 y.o. year old male who is a primary care patient of [name], DO. The CCM team was consulted for assistance with chronic disease management and care coordination needs. Patient with a past medical history including but not limited to: hypertension, type 2 diabetes, hyperlipidemia, hearing loss of both ears, lower extremity edema and obesity.  I reached out to Aaron Sosa by phone today.   Review of patient status, including review of consultants reports, relevant laboratory and other test results, and collaboration with appropriate care team members and the patient's provider was performed as part of comprehensive patient evaluation and provision of chronic care management services.      Outpatient Encounter Medications as of 11/13/2019  Medication Sig  . calcium carbonate (OSCAL) 1500 (600 Ca) MG TABS tablet Take 600 mg of elemental calcium by mouth 2 (two) times daily with a meal.  . magnesium 30 MG tablet Take 30 mg by mouth once.  . metFORMIN (GLUCOPHAGE-XR) 500 MG 24 hr tablet Take 500 mg by mouth daily.  Marland Kitchen acetaminophen (TYLENOL) 325 MG tablet Take by mouth.  Letta Pate ULTRA test strip Use to check blood sugar up to 2 x daily  . [DISCONTINUED] metFORMIN (GLUCOPHAGE) 500 MG tablet Take 1 tablet (500 mg total) by mouth 2 (two) times daily with a meal.  . [DISCONTINUED] Multiple Vitamin (MULTIVITAMIN) capsule Take 1 capsule by mouth daily.   No facility-administered encounter medications on file as of 11/13/2019.    Goals Addressed            This Visit's Progress   . PharmD - "It is depressing to check my blood sugar"       Current Barriers:  . Financial . Limited hearing . Limited eyesight - macular degeneration   . Reluctance to take medication  Pharmacist Clinical Goal: Over the next 30 days, patient will work with CM Pharmacist and PCP to address needs related to diabetes management and medication adherence  Interventions:  Follow up with patient regarding his blood pressure management  ? Note patient currently off of blood pressure medicine ? Review recent home BP readings ? 1/1: 113/72, HR 83 ? 1/8: 121/69, HR 78 ? 1/12: 120/73, HR 78  Follow up with Aaron Sosa regarding his diabetes medication management ? Patient reports taking: metformin ER 500 mg once daily with supper ? Note prescription for metformin ER 500 mg once daily sent to pharmacy by PCP in July to allow patient to try ER form if experiencing intolerance to metformin IR due to GI side effects ? Patient reports decided to start taking this dose at the beginning of January as he was concerned that his metformin may have been contributing to his joint pain. Reports feels the pain has improved with change of to extended release form and dose reduction ? Review recent blood sugars (see below)  Discuss importance of having well-balanced diet and control of carbohydrate portion size. ? Aaron Sosa reports paying close attention to the carbohydrate and sodium content of his meals  Provide counseling on COVID-19 virus and vaccination. Patient states that he is not interested in being vaccinated for COVID-19 due to concerns about the safety of the vaccine.  Patient Self Care Activities:   Patient making  dietary modifications  Patient to take medications as prescribed  Attends all scheduled provider appointments  Calls pharmacy for medication refills  Patient to check blood sugar regularly and keep log Date Fasting Blood Glucose  8 - January 152  9 - January 138  10 - January 137  11 - January 135  12 - January 132  13 - January 143  14 - January 155  Average 142    Patient to check blood pressure daily and keep  log   Please see past updates related to this goal by clicking on the "Past Updates" button in the selected goal          Plan  Telephone follow up appointment with care management team member scheduled for: 2/11 at 2 pm  Harlow Asa, PharmD, [place] [phone]

## 2019-11-13 NOTE — Patient Instructions (Signed)
Thank you allowing the Chronic Care Management Team to be a part of your care! It was a pleasure speaking with you today!     CCM (Chronic Care Management) Team    Alto Denver RN, MSN, CCM Nurse Care Coordinator  [phone]   Duanne Moron PharmD  Clinical Pharmacist  [phone]   Dickie La LCSW Clinical Social Worker [phone]  Visit Information  Goals Addressed            This Visit's Progress   . PharmD - "It is depressing to check my blood sugar"       Current Barriers:  . Financial . Limited hearing . Limited eyesight - macular degeneration  . Reluctance to take medication  Pharmacist Clinical Goal: Over the next 30 days, patient will work with CM Pharmacist and PCP to address needs related to diabetes management and medication adherence  Interventions:  Follow up with patient regarding his blood pressure management  ? Note patient currently off of blood pressure medicine ? Review recent home BP readings ? 1/1: 113/72, HR 83 ? 1/8: 121/69, HR 78 ? 1/12: 120/73, HR 78  Follow up with Mr. Tamburrino regarding his diabetes medication management ? Patient reports taking: metformin ER 500 mg once daily with supper ? Note prescription for metformin ER 500 mg once daily sent to pharmacy by PCP in July to allow patient to try ER form if experiencing intolerance to metformin IR due to GI side effects ? Patient reports decided to start taking this dose at the beginning of January as he was concerned that his metformin may have been contributing to his joint pain. Reports feels the pain has improved with change of to extended release form and dose reduction ? Review recent blood sugars (see below)  Discuss importance of having well-balanced diet and control of carbohydrate portion size. ? Mr. Kuenzel reports paying close attention to the carbohydrate and sodium content of his meals  Provide counseling on COVID-19 virus and vaccination. Patient states that he  is not interested in being vaccinated for COVID-19 due to concerns about the safety of the vaccine.  Patient Self Care Activities:   Patient making dietary modifications  Patient to take medications as prescribed  Attends all scheduled provider appointments  Calls pharmacy for medication refills  Patient to check blood sugar regularly and keep log Date Fasting Blood Glucose  8 - January 152  9 - January 138  10 - January 137  11 - January 135  12 - January 132  13 - January 143  14 - January 155  Average 142    Patient to check blood pressure daily and keep log   Please see past updates related to this goal by clicking on the "Past Updates" button in the selected goal          The patient verbalized understanding of instructions provided today and declined a print copy of patient instruction materials.   Telephone follow up appointment with care management team member scheduled for: 2/11 at 2 pm  Duanne Moron, PharmD, [facility] Clinical Pharmacist [name] [employer] [phone]

## 2019-11-14 ENCOUNTER — Telehealth: Payer: Self-pay

## 2019-11-19 ENCOUNTER — Telehealth: Payer: Self-pay | Admitting: Family Medicine

## 2019-11-19 NOTE — Telephone Encounter (Signed)
I left a message explaining that we need to change his AWV to a virtual or audio visit.  I also explained that he should still come in for labs, and I asked him to call me back. [phone]

## 2019-12-01 ENCOUNTER — Other Ambulatory Visit: Payer: PPO

## 2019-12-02 ENCOUNTER — Other Ambulatory Visit: Payer: Self-pay

## 2019-12-02 ENCOUNTER — Ambulatory Visit: Payer: PPO

## 2019-12-02 ENCOUNTER — Other Ambulatory Visit: Payer: PPO

## 2019-12-02 DIAGNOSIS — I10 Essential (primary) hypertension: Secondary | ICD-10-CM

## 2019-12-02 DIAGNOSIS — E1169 Type 2 diabetes mellitus with other specified complication: Secondary | ICD-10-CM

## 2019-12-02 DIAGNOSIS — E1136 Type 2 diabetes mellitus with diabetic cataract: Secondary | ICD-10-CM | POA: Diagnosis not present

## 2019-12-02 DIAGNOSIS — Z0000 Encounter for general adult medical examination without abnormal findings: Secondary | ICD-10-CM

## 2019-12-02 DIAGNOSIS — E785 Hyperlipidemia, unspecified: Secondary | ICD-10-CM | POA: Diagnosis not present

## 2019-12-02 DIAGNOSIS — R351 Nocturia: Secondary | ICD-10-CM

## 2019-12-03 LAB — COMPLETE METABOLIC PANEL WITH GFR
AG Ratio: 1.4 (calc) (ref 1.0–2.5)
ALT: 12 U/L (ref 9–46)
AST: 16 U/L (ref 10–35)
Albumin: 4.2 g/dL (ref 3.6–5.1)
Alkaline phosphatase (APISO): 69 U/L (ref 35–144)
BUN: 16 mg/dL (ref 7–25)
CO2: 27 mmol/L (ref 20–32)
Calcium: 9.3 mg/dL (ref 8.6–10.3)
Chloride: 104 mmol/L (ref 98–110)
Creat: 0.81 mg/dL (ref 0.70–1.18)
GFR, Est African American: 99 mL/min/{1.73_m2} (ref >=60)
GFR, Est Non African American: 85 mL/min/{1.73_m2} (ref >=60)
Globulin: 3 g/dL (calc) (ref 1.9–3.7)
Glucose, Bld: 143 mg/dL — ABNORMAL HIGH (ref 65–99)
Potassium: 4.9 mmol/L (ref 3.5–5.3)
Sodium: 140 mmol/L (ref 135–146)
Total Bilirubin: 0.6 mg/dL (ref 0.2–1.2)
Total Protein: 7.2 g/dL (ref 6.1–8.1)

## 2019-12-03 LAB — CBC WITH DIFFERENTIAL/PLATELET
Absolute Monocytes: 489 cells/uL (ref 200–950)
Basophils Absolute: 29 cells/uL (ref 0–200)
Basophils Relative: 0.4 %
Eosinophils Absolute: 226 cells/uL (ref 15–500)
Eosinophils Relative: 3.1 %
HCT: 40.2 % (ref 38.5–50.0)
Hemoglobin: 13.4 g/dL (ref 13.2–17.1)
Lymphs Abs: 1402 cells/uL (ref 850–3900)
MCH: 28.2 pg (ref 27.0–33.0)
MCHC: 33.3 g/dL (ref 32.0–36.0)
MCV: 84.6 fL (ref 80.0–100.0)
MPV: 10.1 fL (ref 7.5–12.5)
Monocytes Relative: 6.7 %
Neutro Abs: 5154 cells/uL (ref 1500–7800)
Neutrophils Relative %: 70.6 %
Platelets: 230 10*3/uL (ref 140–400)
RBC: 4.75 10*6/uL (ref 4.20–5.80)
RDW: 14.3 % (ref 11.0–15.0)
Total Lymphocyte: 19.2 %
WBC: 7.3 10*3/uL (ref 3.8–10.8)

## 2019-12-03 LAB — HEMOGLOBIN A1C
Hgb A1c MFr Bld: 7.7 % of total Hgb — ABNORMAL HIGH (ref <5.7)
Mean Plasma Glucose: 174 (calc)
eAG (mmol/L): 9.7 (calc)

## 2019-12-03 LAB — LIPID PANEL
Cholesterol: 234 mg/dL — ABNORMAL HIGH (ref <200)
HDL: 57 mg/dL (ref >=40)
LDL Cholesterol (Calc): 151 mg/dL (calc) — ABNORMAL HIGH
Non-HDL Cholesterol (Calc): 177 mg/dL (calc) — ABNORMAL HIGH (ref <130)
Total CHOL/HDL Ratio: 4.1 (calc) (ref <5.0)
Triglycerides: 140 mg/dL (ref <150)

## 2019-12-03 LAB — PSA: PSA: 0.5 ng/mL (ref <=4.0)

## 2019-12-04 ENCOUNTER — Encounter: Payer: Self-pay | Admitting: Family Medicine

## 2019-12-04 ENCOUNTER — Ambulatory Visit (INDEPENDENT_AMBULATORY_CARE_PROVIDER_SITE_OTHER): Payer: PPO | Admitting: Family Medicine

## 2019-12-04 ENCOUNTER — Other Ambulatory Visit: Payer: Self-pay

## 2019-12-04 VITALS — BP 143/67 | HR 73 | Temp 97.7°F | Resp 16 | Ht 68.0 in | Wt 227.0 lb

## 2019-12-04 DIAGNOSIS — E785 Hyperlipidemia, unspecified: Secondary | ICD-10-CM

## 2019-12-04 DIAGNOSIS — E113293 Type 2 diabetes mellitus with mild nonproliferative diabetic retinopathy without macular edema, bilateral: Secondary | ICD-10-CM

## 2019-12-04 DIAGNOSIS — Z0000 Encounter for general adult medical examination without abnormal findings: Secondary | ICD-10-CM | POA: Diagnosis not present

## 2019-12-04 DIAGNOSIS — E1136 Type 2 diabetes mellitus with diabetic cataract: Secondary | ICD-10-CM | POA: Diagnosis not present

## 2019-12-04 DIAGNOSIS — T466X5A Adverse effect of antihyperlipidemic and antiarteriosclerotic drugs, initial encounter: Secondary | ICD-10-CM

## 2019-12-04 DIAGNOSIS — M791 Myalgia, unspecified site: Secondary | ICD-10-CM

## 2019-12-04 DIAGNOSIS — E669 Obesity, unspecified: Secondary | ICD-10-CM | POA: Diagnosis not present

## 2019-12-04 DIAGNOSIS — E1169 Type 2 diabetes mellitus with other specified complication: Secondary | ICD-10-CM | POA: Diagnosis not present

## 2019-12-04 DIAGNOSIS — I10 Essential (primary) hypertension: Secondary | ICD-10-CM

## 2019-12-04 MED ORDER — METFORMIN HCL ER 750 MG PO TB24: 750.0000 mg | ORAL_TABLET | Freq: Every day | ORAL | 3 refills | Status: DC

## 2019-12-04 MED ORDER — AMLODIPINE BESYLATE 5 MG PO TABS: 5.0000 mg | ORAL_TABLET | Freq: Every day | ORAL | 2 refills | Status: DC

## 2019-12-04 NOTE — Assessment & Plan Note (Signed)
Elevated LDL previous statin intolerance w/ myalgia Calculated ASCVD 10 yr risk score elevated as Diabetic  Plan: 1. Remain off statin currently for now 2. Encourage improved lifestyle - low carb/cholesterol, reduce portion size, continue improving regular exercise

## 2019-12-04 NOTE — Progress Notes (Signed)
Subjective:    Patient ID: Aaron Sosa, male    DOB: 1941/01/06, 79 y.o.   MRN: [id number]  Aaron Sosa is a 79 y.o. male presenting on 12/04/2019 for Annual Exam   HPI   Here for Annual Physical and Lab Review  CHRONIC DM, Type 2 - Last visit with 08/2019, for diabetes. Also reviewed CCM pharmacy documentation. He has been on variety of Diabetic medications over past year including Jardiance, Trulicity, Ozempic due to side effects etc. A1c has improved last result 7.7 (2./2.21) He has taken Metformin XR 500mg  daily without any side effects. He asks about a higher dose CBGs:Reviewed last CBG logs Meds:Metformin XR 500mg  daily Previously on ACEi Lifestyle: - Diet (Improving diet, reduce carb intake, portions) - Exercise (walking regularly) Denies hypoglycemia, polyuria, visual changes, numbness or tingling.  CHRONIC HTN: Admits some elevated BP readings, not checking regularly. Previously had side effect on Lisinopril and HCTZ Current Meds - None (currently off Lisinopril 10mg )   Denies CP, dyspnea, HA, edema, dizziness / lightheadedness  HYPERLIPIDEMIA: Last lipid 12/2019 elevated LDL> he has statin induced myalgia however Not on statin   Health Maintenance: Due for Flu Shot, declines today despite counseling on benefits  PSA 0.5 negative  Depression screen [facility] 2/9 12/04/2019 09/03/2019 09/24/2018  Decreased Interest 0 0 0  Down, Depressed, Hopeless 0 0 0  PHQ - 2 Score 0 0 0  Altered sleeping 0 - -  Tired, decreased energy 0 - -  Change in appetite 0 - -  Feeling bad or failure about yourself  0 - -  Trouble concentrating 0 - -  Moving slowly or fidgety/restless 0 - -  Suicidal thoughts 0 - -  PHQ-9 Score 0 - -  Difficult doing work/chores Not difficult at all - -    Past Medical History:  Diagnosis Date  . Arthritis   . HOH (hard of hearing)   . Lower extremity edema    Past Surgical History:  Procedure Laterality Date  . CATARACT EXTRACTION Left   .  CATARACT EXTRACTION W/PHACO Right 05/06/2015   Procedure: CATARACT EXTRACTION PHACO AND INTRAOCULAR LENS PLACEMENT (IOC);  Surgeon: Lia Hopping, MD;  Location: [facility] ORS;  Service: Ophthalmology;  Laterality: Right;  [place] 1:14.7         AP   11.2          CDE   8.43    cassette lot #[id number]  . RETINAL DETACHMENT SURGERY    . WRIST FRACTURE SURGERY  1958   Social History   Socioeconomic History  . Marital status: Widowed    Spouse name: Not on file  . Number of children: Not on file  . Years of education: [employer]  . Highest education level: High school graduate  Occupational History  . Not on file  Tobacco Use  . Smoking status: Former Smoker    Packs/day: 2.00    Years: 5.00    Pack years: 10.00    Types: Cigarettes  . Smokeless tobacco: Former Engineer, water and Sexual Activity  . Alcohol use: Yes    Alcohol/week: 1.0 standard drinks    Types: 1 Cans of beer per week  . Drug use: Never  . Sexual activity: Not on file  Other Topics Concern  . Not on file  Social History Narrative  . Not on file   Social Determinants of Health   Financial Resource Strain:   . Difficulty of Paying Living Expenses: Not on file  Food Insecurity:   . Worried About Programme researcher, broadcasting/film/video in the Last Year: Not on file  . Ran Out of Food in the Last Year: Not on file  Transportation Needs:   . Lack of Transportation (Medical): Not on file  . Lack of Transportation (Non-Medical): Not on file  Physical Activity: Inactive  . Days of Exercise per Week: 0 days  . Minutes of Exercise per Session: 0 min  Stress:   . Feeling of Stress : Not on file  Social Connections:   . Frequency of Communication with Friends and Family: Not on file  . Frequency of Social Gatherings with Friends and Family: Not on file  . Attends Religious Services: Not on file  . Active Member of Clubs or Organizations: Not on file  . Attends Banker Meetings: Not on file  . Marital Status: Not on file    Intimate Partner Violence:   . Fear of Current or Ex-Partner: Not on file  . Emotionally Abused: Not on file  . Physically Abused: Not on file  . Sexually Abused: Not on file   No family history on file. Current Outpatient Medications on File Prior to Visit  Medication Sig  . acetaminophen (TYLENOL) 325 MG tablet Take by mouth.  . calcium carbonate (OSCAL) 1500 (600 Ca) MG TABS tablet Take 600 mg of elemental calcium by mouth 2 (two) times daily with a meal.  . magnesium 30 MG tablet Take 30 mg by mouth once.  Letta Pate ULTRA test strip Use to check blood sugar up to 2 x daily   No current facility-administered medications on file prior to visit.    Review of Systems  Constitutional: Negative for activity change, appetite change, chills, diaphoresis, fatigue and fever.  HENT: Negative for congestion and hearing loss.   Eyes: Negative for visual disturbance.  Respiratory: Negative for apnea, cough, chest tightness, shortness of breath and wheezing.   Cardiovascular: Negative for chest pain, palpitations and leg swelling.  Gastrointestinal: Negative for abdominal pain, constipation, diarrhea, nausea and vomiting.  Endocrine: Negative for cold intolerance.  Genitourinary: Negative for difficulty urinating, dysuria, frequency and hematuria.  Musculoskeletal: Negative for arthralgias and neck pain.  Skin: Negative for rash.  Allergic/Immunologic: Negative for environmental allergies.  Neurological: Negative for dizziness, weakness, light-headedness, numbness and headaches.  Hematological: Negative for adenopathy.  Psychiatric/Behavioral: Negative for behavioral problems, dysphoric mood and sleep disturbance. The patient is not nervous/anxious.    Per HPI unless specifically indicated above     Objective:    BP (!) 143/67   Pulse 73   Temp 97.7 F (36.5 C) (Oral)   Resp 16   Ht 5\' 8"  (1.727 m)   Wt 227 lb (103 kg)   SpO2 98%   BMI 34.52 kg/m   Wt Readings from Last 3  Encounters:  12/04/19 227 lb (103 kg)  09/03/19 227 lb (103 kg)  12/30/18 230 lb (104.3 kg)    Physical Exam Vitals and nursing note reviewed.  Constitutional:      General: He is not in acute distress.    Appearance: He is well-developed. He is not diaphoretic.     Comments: Well-appearing, comfortable, cooperative  HENT:     Head: Normocephalic and atraumatic.     Ears:     Comments: Hard of hearing Eyes:     General:        Right eye: No discharge.        Left eye: No discharge.  Conjunctiva/sclera: Conjunctivae normal.     Pupils: Pupils are equal, round, and reactive to light.  Neck:     Thyroid: No thyromegaly.  Cardiovascular:     Rate and Rhythm: Normal rate and regular rhythm.     Heart sounds: Normal heart sounds. No murmur.  Pulmonary:     Effort: Pulmonary effort is normal. No respiratory distress.     Breath sounds: Normal breath sounds. No wheezing or rales.  Abdominal:     General: Bowel sounds are normal. There is no distension.     Palpations: Abdomen is soft. There is no mass.     Tenderness: There is no abdominal tenderness.  Musculoskeletal:        General: No tenderness. Normal range of motion.     Cervical back: Normal range of motion and neck supple.     Comments: Upper / Lower Extremities: - Normal muscle tone, strength bilateral upper extremities 5/5, lower extremities 5/5  Lymphadenopathy:     Cervical: No cervical adenopathy.  Skin:    General: Skin is warm and dry.     Findings: No erythema or rash.  Neurological:     Mental Status: He is alert and oriented to person, place, and time.     Comments: Distal sensation intact to light touch all extremities  Psychiatric:        Behavior: Behavior normal.     Comments: Well groomed, good eye contact, normal speech and thoughts      Recent Labs    12/30/18 0934 09/03/19 1456 12/02/19 0817  HGBA1C 11.8* 8.0* 7.7*    Results for orders placed or performed in visit on 12/02/19  [facility] -  PSA physical  Result Value Ref Range   PSA 0.5 < OR = 4.0 ng/mL  [facility] - Lipid panel physical  Result Value Ref Range   Cholesterol 234 (H) <200 mg/dL   HDL 57 > OR = 40 mg/dL   Triglycerides 253 <664 mg/dL   LDL Cholesterol (Calc) 151 (H) mg/dL (calc)   Total CHOL/HDL Ratio 4.1 <5.0 (calc)   Non-HDL Cholesterol (Calc) 177 (H) <130 mg/dL (calc)  [facility] - CMET w/ GFR CMP Complete Metabolic Panel physical  Result Value Ref Range   Glucose, Bld 143 (H) 65 - 99 mg/dL   BUN 16 7 - 25 mg/dL   Creat 4.03 4.74 - 2.59 mg/dL   GFR, Est Non African American 85 > OR = 60 mL/min/1.50m2   GFR, Est African American 99 > OR = 60 mL/min/1.72m2   BUN/Creatinine Ratio NOT APPLICABLE 6 - 22 (calc)   Sodium 140 135 - 146 mmol/L   Potassium 4.9 3.5 - 5.3 mmol/L   Chloride 104 98 - 110 mmol/L   CO2 27 20 - 32 mmol/L   Calcium 9.3 8.6 - 10.3 mg/dL   Total Protein 7.2 6.1 - 8.1 g/dL   Albumin 4.2 3.6 - 5.1 g/dL   Globulin 3.0 1.9 - 3.7 g/dL (calc)   AG Ratio 1.4 1.0 - 2.5 (calc)   Total Bilirubin 0.6 0.2 - 1.2 mg/dL   Alkaline phosphatase (APISO) 69 35 - 144 U/L   AST 16 10 - 35 U/L   ALT 12 9 - 46 U/L  [facility] - CBC with Differential/Platelet physical  Result Value Ref Range   WBC 7.3 3.8 - 10.8 Thousand/uL   RBC 4.75 4.20 - 5.80 Million/uL   Hemoglobin 13.4 13.2 - 17.1 g/dL   HCT 56.3 87.5 - 64.3 %   MCV 84.6  80.0 - 100.0 fL   MCH 28.2 27.0 - 33.0 pg   MCHC 33.3 32.0 - 36.0 g/dL   RDW 44.0 34.7 - 42.5 %   Platelets 230 140 - 400 Thousand/uL   MPV 10.1 7.5 - 12.5 fL   Neutro Abs 5,154 1,500 - 7,800 cells/uL   Lymphs Abs 1,402 850 - 3,900 cells/uL   Absolute Monocytes 489 200 - 950 cells/uL   Eosinophils Absolute 226 15 - 500 cells/uL   Basophils Absolute 29 0 - 200 cells/uL   Neutrophils Relative % 70.6 %   Total Lymphocyte 19.2 %   Monocytes Relative 6.7 %   Eosinophils Relative 3.1 %   Basophils Relative 0.4 %  [facility] - A1c LAB Hemoglobin A1C physical  Result Value Ref Range   Hgb A1c MFr  Bld 7.7 (H) <5.7 % of total Hgb   Mean Plasma Glucose 174 (calc)   eAG (mmol/L) 9.7 (calc)      Assessment & Plan:   Problem List Items Addressed This Visit    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 INCREASE Metformin XR from 500 to 750mg  once daily - reviewed dosing and advised if side effect switch back to XR 500 2. Encourage improved lifestyle - low carb, low sugar diet, reduce portion size, continue improving regular exercise 3. Off ACE - check Microalbumin urine send out 4. Failed statin due to myalgia - discontinued 5. DM Eye - retinopathy      Relevant Medications   metFORMIN (GLUCOPHAGE-XR) 750 MG 24 hr tablet   Other Relevant Orders   Microalbumin, urine   Obesity (BMI 30.0-34.9)   Myalgia due to statin   Hyperlipidemia associated with type 2 diabetes mellitus (HCC)    Elevated LDL previous statin intolerance w/ myalgia Calculated ASCVD 10 yr risk score elevated as Diabetic  Plan: 1. Remain off statin currently for now 2. Encourage improved lifestyle - low carb/cholesterol, reduce portion size, continue improving regular exercise      Relevant Medications   metFORMIN (GLUCOPHAGE-XR) 750 MG 24 hr tablet   Essential hypertension    Elevated BP still No known complications  Failed Lisinopril HCTZ   Plan:  1. Trial on Amlodipine 5mg  daily CCB, new med to him 2. Encourage improved lifestyle - low sodium diet, regular exercise 3. Continue monitor BP outside office, bring readings to next visit, if persistently >140/90 or new symptoms notify office  sooner      Relevant Medications   amLODipine (NORVASC) 5 MG tablet   Diabetic retinopathy (HCC)   Relevant Medications   metFORMIN (GLUCOPHAGE-XR) 750 MG 24 hr tablet    Other Visit Diagnoses    Annual physical exam    -  Primary      Updated Health Maintenance information Reviewed recent lab results with patient Encouraged improvement to lifestyle with diet and exercise - Goal of weight loss   Meds ordered this encounter  Medications  . metFORMIN (GLUCOPHAGE-XR) 750 MG 24 hr tablet    Sig: Take 1 tablet (750 mg total) by mouth daily with breakfast.    Dispense:  90 tablet    Refill:  3    Dose increase from 500 to 750  . amLODipine (NORVASC) 5 MG tablet    Sig: Take 1 tablet (5 mg total) by mouth daily.    Dispense:  30 tablet    Refill:  2    Follow up plan: Return in about 6 months (around 06/02/2020) for 6 month follow-up DM A1c, HTN.  Nobie Putnam, [facility] 12/04/2019, 9:21 AM

## 2019-12-04 NOTE — Assessment & Plan Note (Signed)
Controlled type 2 DM with A1c 7.7, within 7-8 range now, on Metformin and diet improved No hypoglycemia Complications - retinopathy, cataracts bilateral (s/p surgery), other including hyperlipidemia specifically obesity - increases risk of future cardiovascular complications  Failed  Glipizide (side effect), Ozempic (unable to use), Trulicity (in thigh, muscle ache and weak), Jardiance (joint ache)  Plan:  1. INCREASE Metformin XR from 500 to 750mg  once daily - reviewed dosing and advised if side effect switch back to XR 500 2. Encourage improved lifestyle - low carb, low sugar diet, reduce portion size, continue improving regular exercise 3. Off ACE - check Microalbumin urine send out 4. Failed statin due to myalgia - discontinued 5. DM Eye - retinopathy

## 2019-12-04 NOTE — Assessment & Plan Note (Signed)
Elevated BP still No known complications  Failed Lisinopril HCTZ   Plan:  1. Trial on Amlodipine 5mg  daily CCB, new med to him 2. Encourage improved lifestyle - low sodium diet, regular exercise 3. Continue monitor BP outside office, bring readings to next visit, if persistently >140/90 or new symptoms notify office sooner

## 2019-12-04 NOTE — Patient Instructions (Addendum)
Thank you for coming to the office today.  Increase to Metformin XR 750mg  once daily - keep track of sugars, if cause side effect can go back to XR 500mg   For BP it is mildly elevated add new pill Amlodipine 5mg  daily once a day.  Please schedule a Follow-up Appointment to: Return in about 6 months (around 06/02/2020) for 6 month follow-up DM A1c, HTN.  If you have any other questions or concerns, please feel free to call the office or send a message through MyChart. You may also schedule an earlier appointment if necessary.  Additionally, you may be receiving a survey about your experience at our office within a few days to 1 week by e-mail or mail. We value your feedback.  , DO [facility], 08/02/2020

## 2019-12-05 LAB — MICROALBUMIN, URINE: Microalb, Ur: 0.5 mg/dL

## 2019-12-11 ENCOUNTER — Ambulatory Visit (INDEPENDENT_AMBULATORY_CARE_PROVIDER_SITE_OTHER): Payer: PPO | Admitting: Pharmacist

## 2019-12-11 DIAGNOSIS — E1136 Type 2 diabetes mellitus with diabetic cataract: Secondary | ICD-10-CM

## 2019-12-11 DIAGNOSIS — I10 Essential (primary) hypertension: Secondary | ICD-10-CM | POA: Diagnosis not present

## 2019-12-11 NOTE — Patient Instructions (Signed)
Thank you allowing the Chronic Care Management Team to be a part of your care! It was a pleasure speaking with you today!     CCM (Chronic Care Management) Team    Alto Denver RN, MSN, CCM Nurse Care Coordinator  [phone]   Duanne Moron PharmD  Clinical Pharmacist  [phone]   Dickie La LCSW Clinical Social Worker [phone]  Visit Information  Goals Addressed            This Visit's Progress   . PharmD - "It is depressing to check my blood sugar"       Current Barriers:  . Financial . Limited hearing . Limited eyesight - macular degeneration  . Reluctance to take medication  Pharmacist Clinical Goal: Over the next 30 days, patient will work with CM Pharmacist and PCP to address needs related to diabetes management and medication adherence  Interventions:  Follow up with patient regarding his blood pressure management  ? Reports currently taking: amlodipine 5 mg once daily ? Reports only checked BP once this week, today: 117/73, HR 64 ? Encourage patient to check regularly and continue to keep log  Follow up with Mr. Furches regarding his diabetes medication management ? Patient reports taking: metformin ER 750 mg once daily with supper ? Review recent blood sugars (see below)  Discuss factors impacting blood sugar including diet and exercise  Counsel of falls prevention ? Reports fell in past week coming in back door and "missed my step". Attributes fall to strength of his leg. States that he only skinned his knee; denies any other injury ? Denies need to be seen by PCP at this time. ? Patient reports having a cane, but not always using. Encourage more consistent use of cane.  ? Patient denies need for railing at back door as there is only one step ? Reports his daughter has recently added a side railing on his bed ? Declines referral to CCM Nurse Case Manager for education on falls prevention  Counsel patient to call the office for any new  medical question or concern, including any future fall.  Patient Self Care Activities:   Patient making dietary modifications  Patient to take medications as prescribed  Attends all scheduled provider appointments  Calls pharmacy for medication refills  Patient to check blood sugar regularly and keep log Date Fasting Blood Glucose  4 - February 138  5 - February 115  6 - February 135  7 - February 124  8 - February 142  9 - February 131  10 - February 128  11 - February 141  Average 132    Patient to check blood pressure daily and keep log   Please see past updates related to this goal by clicking on the "Past Updates" button in the selected goal          The patient verbalized understanding of instructions provided today and declined a print copy of patient instruction materials.   Telephone follow up appointment with care management team member scheduled for: 3/3 at 11:30 am  Duanne Moron, PharmD, [name] Clinical Pharmacist [name] [phone]

## 2019-12-11 NOTE — Chronic Care Management (AMB) (Signed)
Chronic Care Management   Follow Up Note   12/11/2019 Name: JOSHUAJAMES MOEHRING MRN: [id number] DOB: 1941-04-01  Referred by: Olin Hauser, DO Reason for referral : Chronic Care Management (Patient Phone Call)   HAYDEN KIHARA is a 79 y.o. year old male who is a primary care patient of Olin Hauser, DO. The CCM team was consulted for assistance with chronic disease management and care coordination needs.  Patient with a past medical history including but not limited to: hypertension, type 2 diabetes, hyperlipidemia, hearing loss of both ears, lower extremity edema and obesity.  I reached out to [name] by phone today.   Review of patient status, including review of consultants reports, relevant laboratory and other test results, and collaboration with appropriate care team members and the patient's provider was performed as part of comprehensive patient evaluation and provision of chronic care management services.     Outpatient Encounter Medications as of 12/11/2019  Medication Sig Note  . amLODipine (NORVASC) 5 MG tablet Take 1 tablet (5 mg total) by mouth daily.   . metFORMIN (GLUCOPHAGE-XR) 750 MG 24 hr tablet Take 1 tablet (750 mg total) by mouth daily with breakfast. 12/11/2019: Taking daily with supper  . acetaminophen (TYLENOL) 325 MG tablet Take by mouth.   . calcium carbonate (OSCAL) 1500 (600 Ca) MG TABS tablet Take 600 mg of elemental calcium by mouth 2 (two) times daily with a meal.   . magnesium 30 MG tablet Take 30 mg by mouth once.   [name] ULTRA test strip Use to check blood sugar up to 2 x daily    No facility-administered encounter medications on file as of 12/11/2019.    Goals Addressed            This Visit's Progress   . PharmD - "It is depressing to check my blood sugar"       Current Barriers:  . Financial . Limited hearing . Limited eyesight - macular degeneration  . Reluctance to take medication  Pharmacist Clinical Goal:  Over the next 30 days, patient will work with CM Pharmacist and PCP to address needs related to diabetes management and medication adherence  Interventions:  Follow up with patient regarding his blood pressure management  ? Reports currently taking: amlodipine 5 mg once daily ? Reports only checked BP once this week, today: 117/73, HR 64 ? Encourage patient to check regularly and continue to keep log  Follow up with Mr. Stoudt regarding his diabetes medication management ? Patient reports taking: metformin ER 750 mg once daily with supper ? Review recent blood sugars (see below)  Discuss factors impacting blood sugar including diet and exercise  Counsel of falls prevention ? Reports fell in past week coming in back door and "missed my step". Attributes fall to strength of his leg. States that he only skinned his knee; denies any other injury ? Denies need to be seen by PCP at this time. ? Patient reports having a cane, but not always using. Encourage more consistent use of cane.  ? Patient denies need for railing at back door as there is only one step ? Reports his daughter has recently added a side railing on his bed ? Declines referral to CCM Nurse Case Manager for education on falls prevention  Counsel patient to call the office for any new medical question or concern, including any future fall.  Patient Self Care Activities:   Patient making dietary modifications  Patient to take  medications as prescribed  Attends all scheduled provider appointments  Calls pharmacy for medication refills  Patient to check blood sugar regularly and keep log Date Fasting Blood Glucose  4 - February 138  5 - February 115  6 - February 135  7 - February 124  8 - February 142  9 - February 131  10 - February 128  11 - February 141  Average 132    Patient to check blood pressure daily and keep log   Please see past updates related to this goal by clicking on the "Past Updates" button  in the selected goal          Plan  Telephone follow up appointment with care management team member scheduled for: 3/3 at 11:30 am  Duanne Moron, PharmD, [facility] Clinical Pharmacist [facility] [phone]

## 2019-12-16 DIAGNOSIS — H26491 Other secondary cataract, right eye: Secondary | ICD-10-CM | POA: Diagnosis not present

## 2019-12-16 DIAGNOSIS — E113293 Type 2 diabetes mellitus with mild nonproliferative diabetic retinopathy without macular edema, bilateral: Secondary | ICD-10-CM | POA: Diagnosis not present

## 2019-12-16 LAB — HM DIABETES EYE EXAM

## 2019-12-19 ENCOUNTER — Telehealth: Payer: Self-pay

## 2019-12-19 ENCOUNTER — Encounter: Payer: Self-pay | Admitting: Family Medicine

## 2019-12-23 NOTE — Telephone Encounter (Signed)
See other message

## 2019-12-29 ENCOUNTER — Other Ambulatory Visit: Payer: PPO

## 2019-12-31 ENCOUNTER — Ambulatory Visit (INDEPENDENT_AMBULATORY_CARE_PROVIDER_SITE_OTHER): Payer: PPO | Admitting: Pharmacist

## 2019-12-31 ENCOUNTER — Other Ambulatory Visit: Payer: Self-pay | Admitting: Family Medicine

## 2019-12-31 DIAGNOSIS — E1136 Type 2 diabetes mellitus with diabetic cataract: Secondary | ICD-10-CM | POA: Diagnosis not present

## 2019-12-31 DIAGNOSIS — I10 Essential (primary) hypertension: Secondary | ICD-10-CM

## 2019-12-31 NOTE — Patient Instructions (Signed)
Thank you allowing the Chronic Care Management Team to be a part of your care! It was a pleasure speaking with you today!     CCM (Chronic Care Management) Team    Alto Denver RN, MSN, CCM Nurse Care Coordinator  [phone]   Duanne Moron PharmD  Clinical Pharmacist  [phone]   Dickie La LCSW Clinical Social Worker [phone]  Visit Information  Goals Addressed            This Visit's Progress   . PharmD - "It is depressing to check my blood sugar"       CARE PLAN ENTRY (see longitudinal plan of care for additional care plan information)  Current Barriers:  . Financial . Limited hearing . Limited eyesight - macular degeneration  . Reluctance to take medication    Pharmacist Clinical Goal: Over the next 30 days, patient will work with CM Pharmacist and PCP to address needs related to diabetes management and medication adherence  Interventions:  Follow up with patient regarding his blood pressure management  ? Reports currently taking: amlodipine 5 mg once daily ? Review recent BP results: ? 2/23: 124/71, HR 76 ? 3/1: 121/71, HR 77 ? 3/3: 121/77, HR 88 ? Encourage patient to check regularly and continue to keep log  Follow up with Mr. Dross regarding his diabetes medication management ? Patient reports taking: metformin ER 750 mg once daily with supper ? Review recent blood sugars (see below) ? Address patient's questions about metformin including medication's mechanism of action ? Patient asks about increasing metformin ER dose   Discuss factors impacting blood sugar including diet and exercise  Note patient with diabetes not currently on statin: intolerance (previous myalgias to statins)  Collaborate with provider regarding patient's recent CBGs and medication management ? Recommend increase of metormin ER 750 mg twice daily. PCP agrees  Follow up with patient regarding dose change of metformin ER to 750 mg twice daily with breakfast and  supper.  ? Patient verbalizes understanding of change via teach back method  Patient Self Care Activities:   Patient making dietary modifications  Patient to take medications as prescribed  Attends all scheduled provider appointments  Calls pharmacy for medication refills  Patient to check blood sugar regularly and keep log Date Fasting Blood Glucose  25 - February 142  26 - February 140  27 - February 140  28 - February 149  1 - March 142  2 - March 148  3 - March 140  Average 143    Patient to check blood pressure daily and keep log   Please see past updates related to this goal by clicking on the "Past Updates" button in the selected goal          Patient verbalizes understanding of instructions provided today.   Telephone follow up appointment with care management team member scheduled for: 3/17 at 2 pm  Duanne Moron, PharmD, [facility] Clinical Pharmacist [facility] [employer] [phone]

## 2019-12-31 NOTE — Chronic Care Management (AMB) (Signed)
Chronic Care Management   Follow Up Note   12/31/2019 Name: Aaron Sosa MRN: [id number] DOB: May 29, 1941  Referred by: [name], DO Reason for referral : Chronic Care Management (Patient Phone Call)   TAVI HOOGENDOORN is a 79 y.o. year old male who is a primary care patient of [name], DO. The CCM team was consulted for assistance with chronic disease management and care coordination needs.  Patient with a past medical history including but not limited to: hypertension, type 2 diabetes, hyperlipidemia, hearing loss of both ears, lower extremity edema and obesity.  I reached out to [name] by phone today.   Review of patient status, including review of consultants reports, relevant laboratory and other test results, and collaboration with appropriate care team members and the patient's provider was performed as part of comprehensive patient evaluation and provision of chronic care management services.    SDOH (Social Determinants of Health) screening performed today: Physical Activity. See Care Plan for related entries.   Objective  Lab Results  Component Value Date   HGBA1C 7.7 (H) 12/02/2019   HGBA1C 8.0 (A) 09/03/2019   HGBA1C 11.8 (A) 12/30/2018   Lab Results  Component Value Date   MICROALBUR 0.5 12/04/2019   LDLCALC 151 (H) 12/02/2019   CREATININE 0.81 12/02/2019    Outpatient Encounter Medications as of 12/31/2019  Medication Sig Note  . amLODipine (NORVASC) 5 MG tablet Take 1 tablet (5 mg total) by mouth daily.   . metFORMIN (GLUCOPHAGE-XR) 750 MG 24 hr tablet Take 1 tablet (750 mg total) by mouth daily with breakfast. 12/11/2019: Taking daily with supper  . acetaminophen (TYLENOL) 325 MG tablet Take by mouth.   . calcium carbonate (OSCAL) 1500 (600 Ca) MG TABS tablet Take 600 mg of elemental calcium by mouth 2 (two) times daily with a meal.   . magnesium 30 MG tablet Take 30 mg by mouth once.   Letta Pate ULTRA test strip Use to check  blood sugar up to 2 x daily    No facility-administered encounter medications on file as of 12/31/2019.    Goals Addressed            This Visit's Progress   . PharmD - "It is depressing to check my blood sugar"       CARE PLAN ENTRY (see longitudinal plan of care for additional care plan information)  Current Barriers:  . Financial . Limited hearing . Limited eyesight - macular degeneration  . Reluctance to take medication    Pharmacist Clinical Goal: Over the next 30 days, patient will work with CM Pharmacist and PCP to address needs related to diabetes management and medication adherence  Interventions:  Follow up with patient regarding his blood pressure management  ? Reports currently taking: amlodipine 5 mg once daily ? Review recent BP results: ? 2/23: 124/71, HR 76 ? 3/1: 121/71, HR 77 ? 3/3: 121/77, HR 88 ? Encourage patient to check regularly and continue to keep log  Follow up with Mr. Dutch regarding his diabetes medication management ? Patient reports taking: metformin ER 750 mg once daily with supper ? Review recent blood sugars (see below) ? Address patient's questions about metformin including medication's mechanism of action ? Patient asks about increasing metformin ER dose   Discuss factors impacting blood sugar including diet and exercise  Note patient with diabetes not currently on statin: intolerance (previous myalgias to statins)  Collaborate with provider regarding patient's recent CBGs and medication management ?  Recommend increase of metormin ER 750 mg twice daily. PCP agrees  Follow up with patient regarding dose change of metformin ER to 750 mg twice daily with breakfast and supper.  ? Patient verbalizes understanding of change via teach back method  Patient Self Care Activities:   Patient making dietary modifications  Patient to take medications as prescribed  Attends all scheduled provider appointments  Calls pharmacy for  medication refills  Patient to check blood sugar regularly and keep log Date Fasting Blood Glucose  25 - February 142  26 - February 140  27 - February 140  28 - February 149  1 - March 142  2 - March 148  3 - March 140  Average 143    Patient to check blood pressure daily and keep log   Please see past updates related to this goal by clicking on the "Past Updates" button in the selected goal          Plan  Telephone follow up appointment with care management team member scheduled for: 3/17 at 2 pm  Harlow Asa, PharmD, [place] [phone]

## 2020-01-01 ENCOUNTER — Ambulatory Visit: Payer: Self-pay

## 2020-01-01 NOTE — Chronic Care Management (AMB) (Signed)
  Care Management   Follow Up Note   01/01/2020 Name: ROBBEN JAGIELLO MRN: [id number] DOB: 23-Mar-1941  Referred by: [name], DO Reason for referral : Care Coordination   Ramzi ORLAN AVERSA is a 79 y.o. year old male who is a primary care patient of [name], DO. The care management team was consulted for assistance with care management and care coordination needs.    Review of patient status, including review of consultants reports, relevant laboratory and other test results, and collaboration with appropriate care team members and the patient's provider was performed as part of comprehensive patient evaluation and provision of chronic care management services.    LCSW completed CCM outreach attempt today but was unable to reach patient successfully. A HIPPA compliant voice message was left encouraging patient to return call once available. LCSW rescheduled CCM SW appointment as well.  A HIPPA compliant phone message was left for the patient providing contact information and requesting a return call.   Dickie La, BSW, MSW, LCSW [name] [place]  [facility] [email or web address] Phone: [phone]

## 2020-01-14 ENCOUNTER — Ambulatory Visit: Payer: Self-pay | Admitting: Pharmacist

## 2020-01-14 ENCOUNTER — Other Ambulatory Visit: Payer: Self-pay | Admitting: Family Medicine

## 2020-01-14 DIAGNOSIS — E1136 Type 2 diabetes mellitus with diabetic cataract: Secondary | ICD-10-CM

## 2020-01-14 DIAGNOSIS — I10 Essential (primary) hypertension: Secondary | ICD-10-CM | POA: Diagnosis not present

## 2020-01-14 MED ORDER — METFORMIN HCL ER 750 MG PO TB24: 750.0000 mg | ORAL_TABLET | Freq: Two times a day (BID) | ORAL | 3 refills | Status: DC

## 2020-01-14 NOTE — Chronic Care Management (AMB) (Signed)
Chronic Care Management   Follow Up Note   01/14/2020 Name: Aaron Sosa MRN: [id number] DOB: 15-Jul-1941  Referred by: Olin Hauser, DO Reason for referral : Chronic Care Management (Patient Phone Call)   Aaron Sosa is a 79 y.o. year old male who is a primary care patient of Olin Hauser, DO. The CCM team was consulted for assistance with chronic disease management and care coordination needs.  Patient with a past medical history including but not limited to: hypertension, type 2 diabetes, hyperlipidemia, hearing loss of both ears, lower extremity edema and obesity.  I reached out to [name] by phone today.   Review of patient status, including review of consultants reports, relevant laboratory and other test results, and collaboration with appropriate care team members and the patient's provider was performed as part of comprehensive patient evaluation and provision of chronic care management services.    SDOH (Social Determinants of Health) screening performed today: Physical Activity. See Care Plan for related entries.   Outpatient Encounter Medications as of 01/14/2020  Medication Sig  . amLODipine (NORVASC) 5 MG tablet Take 1 tablet (5 mg total) by mouth daily.  . metFORMIN (GLUCOPHAGE-XR) 750 MG 24 hr tablet Take 1 tablet (750 mg total) by mouth 2 (two) times daily with a meal.  . acetaminophen (TYLENOL) 325 MG tablet Take by mouth.  . calcium carbonate (OSCAL) 1500 (600 Ca) MG TABS tablet Take 600 mg of elemental calcium by mouth 2 (two) times daily with a meal.  . magnesium 30 MG tablet Take 30 mg by mouth once.  [name] ULTRA test strip Use to check blood sugar up to 2 x daily   No facility-administered encounter medications on file as of 01/14/2020.    Goals Addressed            This Visit's Progress   . PharmD - "It is depressing to check my blood sugar"       CARE PLAN ENTRY (see longitudinal plan of care for additional care plan  information)  Current Barriers:  . Financial . Limited hearing . Limited eyesight - macular degeneration  . Reluctance to take medication    Pharmacist Clinical Goal: Over the next 30 days, patient will work with CM Pharmacist and PCP to address needs related to diabetes management and medication adherence  Interventions:  Follow up with patient regarding his blood pressure management  ? Reports currently taking: amlodipine 5 mg once daily ? Denies checking BP since we last spoke ? Encourage patient to check regularly and keep log  Follow up with Mr. Escandon regarding his diabetes medication management ? Patient reports taking: metformin ER 750 mg twice daily with breakfast and supper ? Review recent blood sugars (see below)  ? Patient denies missed doses of medication  Discuss factors impacting blood sugar including diet and exercise  Note patient with diabetes not currently on statin: intolerance (previous myalgias to statins)  Will collaborate with provider to request new metformin Rx be sent to patient's pharmacy for 90 day supply   Patient Self Care Activities:   Patient making dietary modifications  Patient to take medications as prescribed  Attends all scheduled provider appointments  Calls pharmacy for medication refills  Patient to check blood sugar regularly and keep log Date Fasting Blood Glucose  11 - March 140  12 - March 138  13 - March 139  14 - March 144  15 - March 146  16 - March 144  17 - March 141  Average 142    Patient to check blood pressure daily and keep log   Please see past updates related to this goal by clicking on the "Past Updates" button in the selected goal          Plan  Telephone follow up appointment with care management team member scheduled for: 4/14 at 2 pm  Duanne Moron, PharmD, [facility] Clinical Pharmacist [facility] [phone]

## 2020-01-14 NOTE — Patient Instructions (Signed)
Thank you allowing the Chronic Care Management Team to be a part of your care! It was a pleasure speaking with you today!     CCM (Chronic Care Management) Team    Alto Denver RN, MSN, CCM Nurse Care Coordinator  [phone]   Duanne Moron PharmD  Clinical Pharmacist  [phone]   Dickie La LCSW Clinical Social Worker [phone]  Visit Information  Goals Addressed            This Visit's Progress   . PharmD - "It is depressing to check my blood sugar"       CARE PLAN ENTRY (see longitudinal plan of care for additional care plan information)  Current Barriers:  . Financial . Limited hearing . Limited eyesight - macular degeneration  . Reluctance to take medication    Pharmacist Clinical Goal: Over the next 30 days, patient will work with CM Pharmacist and PCP to address needs related to diabetes management and medication adherence  Interventions:  Follow up with patient regarding his blood pressure management  ? Reports currently taking: amlodipine 5 mg once daily ? Denies checking BP since we last spoke ? Encourage patient to check regularly and keep log  Follow up with Mr. Poser regarding his diabetes medication management ? Patient reports taking: metformin ER 750 mg twice daily with breakfast and supper ? Review recent blood sugars (see below)  ? Patient denies missed doses of medication  Discuss factors impacting blood sugar including diet and exercise  Note patient with diabetes not currently on statin: intolerance (previous myalgias to statins)  Will collaborate with provider to request new metformin Rx be sent to patient's pharmacy for 90 day supply   Patient Self Care Activities:   Patient making dietary modifications  Patient to take medications as prescribed  Attends all scheduled provider appointments  Calls pharmacy for medication refills  Patient to check blood sugar regularly and keep log Date Fasting Blood Glucose  11  - March 140  12 - March 138  13 - March 139  14 - March 144  15 - March 146  16 - March 144  17 - March 141  Average 142    Patient to check blood pressure daily and keep log   Please see past updates related to this goal by clicking on the "Past Updates" button in the selected goal          Patient verbalizes understanding of instructions provided today.   Telephone follow up appointment with care management team member scheduled for: 4/14 at 2 pm  Duanne Moron, PharmD, [facility] Clinical Pharmacist [name] [employer] [phone]

## 2020-02-11 ENCOUNTER — Other Ambulatory Visit: Payer: Self-pay | Admitting: Family Medicine

## 2020-02-11 ENCOUNTER — Ambulatory Visit (INDEPENDENT_AMBULATORY_CARE_PROVIDER_SITE_OTHER): Payer: PPO | Admitting: Pharmacist

## 2020-02-11 DIAGNOSIS — E1136 Type 2 diabetes mellitus with diabetic cataract: Secondary | ICD-10-CM | POA: Diagnosis not present

## 2020-02-11 DIAGNOSIS — I10 Essential (primary) hypertension: Secondary | ICD-10-CM | POA: Diagnosis not present

## 2020-02-11 NOTE — Chronic Care Management (AMB) (Signed)
Chronic Care Management   Follow Up Note   02/11/2020 Name: Aaron Sosa MRN: [id number] DOB: December 16, 1940  Referred by: Aaron Cords, DO Reason for referral : Chronic Care Management (Patient Phone Call)   Aaron Sosa is a 79 y.o. year old male who is a primary care patient of Aaron Cords, DO. The CCM team was consulted for assistance with chronic disease management and care coordination needs. Patient with a past medical history including but not limited to: hypertension, type 2 diabetes, hyperlipidemia, hearing loss of both ears, lower extremity edema and obesity.   I reached out to Aaron Sosa by phone today.   Review of patient status, including review of consultants reports, relevant laboratory and other test results, and collaboration with appropriate care team members and the patient's provider was performed as part of comprehensive patient evaluation and provision of chronic care management services.     Outpatient Encounter Medications as of 02/11/2020  Medication Sig  . metFORMIN (GLUCOPHAGE-XR) 750 MG 24 hr tablet Take 1 tablet (750 mg total) by mouth 2 (two) times daily with a meal.  . acetaminophen (TYLENOL) 325 MG tablet Take by mouth.  Marland Kitchen amLODipine (NORVASC) 5 MG tablet Take 1 tablet (5 mg total) by mouth daily. (Patient not taking: Reported on 02/11/2020)  . calcium carbonate (OSCAL) 1500 (600 Ca) MG TABS tablet Take 600 mg of elemental calcium by mouth 2 (two) times daily with a meal.  . magnesium 30 MG tablet Take 30 mg by mouth once.  Aaron Sosa ULTRA test strip Use to check blood sugar up to 2 x daily   No facility-administered encounter medications on file as of 02/11/2020.    Goals Addressed            This Visit's Progress   . PharmD - "It is depressing to check my blood sugar"       CARE PLAN ENTRY (see longitudinal plan of care for additional care plan information)  Current Barriers:  . Financial . Limited hearing . Limited  eyesight - macular degeneration  . Reluctance to take medication/seek medical care  Pharmacist Clinical Goal: Over the next 30 days, patient will work with CM Pharmacist and PCP to address needs related to diabetes management and medication adherence  Interventions:  Follow up with patient regarding his blood pressure management  ? Reports self-discontinued his amlodipine a couple of weeks ago ? Reports recent BP readings: ? 4/12 126/86, HR 81 ? 4/13 110/78, HR 88 ? 4/14 121/88, HR 83  Follow up with [name] Sosa regarding his diabetes medication management ? Patient reports taking: metformin ER 750 mg twice daily with breakfast and supper ? Review recent blood sugars (see below)   Encourage patient to follow up with PCP today ? [name] Sosa reports started to have shorness of breath 2 or more days ago ? Reports did not sleep well last night - shortness of breath worse whenever tries to lay down.  ? Feels like has pressure in stomach causing SOB ? Not sure if he had a fever, but reports he has been sweating more ? Encourage patient to follow up with PCP regarding these symptoms, but patient states "I'm not sure if I'm going to"  Collaborate with PCP to let him know of patient's recent shortness of breath and other reported symptoms  Patient Self Care Activities:   Patient making dietary modifications  Patient to take medications as prescribed  Attends all scheduled provider appointments  Calls pharmacy  for medication refills  Patient to check blood sugar regularly and keep log Date Fasting Blood Glucose  8 - April 130  9 - April 117  10 - April 124  11 - April 127  12 - April 137  13 - April 136  14 - April 162    Patient to check blood pressure daily and keep log   Please see past updates related to this goal by clicking on the "Past Updates" button in the selected goal          Plan  The care management team will reach out to the patient again over the next  30 days.   Aaron Sosa, PharmD, [place] [employer] [phone]

## 2020-02-11 NOTE — Patient Instructions (Signed)
Thank you allowing the Chronic Care Management Team to be a part of your care! It was a pleasure speaking with you today!     CCM (Chronic Care Management) Team    Alto Denver RN, MSN, CCM Nurse Care Coordinator  [phone]   Duanne Moron PharmD  Clinical Pharmacist  [phone]   Dickie La LCSW Clinical Social Worker [phone]  Visit Information  Goals Addressed            This Visit's Progress   . PharmD - "It is depressing to check my blood sugar"       CARE PLAN ENTRY (see longitudinal plan of care for additional care plan information)  Current Barriers:  . Financial . Limited hearing . Limited eyesight - macular degeneration  . Reluctance to take medication/seek medical care  Pharmacist Clinical Goal: Over the next 30 days, patient will work with CM Pharmacist and PCP to address needs related to diabetes management and medication adherence  Interventions:  Follow up with patient regarding his blood pressure management  ? Reports self-discontinued his amlodipine a couple of weeks ago ? Reports recent BP readings: ? 4/12 126/86, HR 81 ? 4/13 110/78, HR 88 ? 4/14 121/88, HR 83  Follow up with Mr. Nill regarding his diabetes medication management ? Patient reports taking: metformin ER 750 mg twice daily with breakfast and supper ? Review recent blood sugars (see below)   Encourage patient to follow up with PCP today ? Mr. Snyders reports started to have shorness of breath 2 or more days ago ? Reports did not sleep well last night - shortness of breath worse whenever tries to lay down.  ? Feels like has pressure in stomach causing SOB ? Not sure if he had a fever, but reports he has been sweating more ? Encourage patient to follow up with PCP regarding these symptoms, but patient states "I'm not sure if I'm going to"  Collaborate with PCP to let him know of patient's recent shortness of breath and other reported symptoms  Patient Self Care  Activities:   Patient making dietary modifications  Patient to take medications as prescribed  Attends all scheduled provider appointments  Calls pharmacy for medication refills  Patient to check blood sugar regularly and keep log Date Fasting Blood Glucose  8 - April 130  9 - April 117  10 - April 124  11 - April 127  12 - April 137  13 - April 136  14 - April 162    Patient to check blood pressure daily and keep log   Please see past updates related to this goal by clicking on the "Past Updates" button in the selected goal          Patient verbalizes understanding of instructions provided today.   The care management team will reach out to the patient again over the next 30 days.   Duanne Moron, PharmD, [facility] Clinical Pharmacist [facility] [employer] [phone]

## 2020-02-18 DIAGNOSIS — R0602 Shortness of breath: Secondary | ICD-10-CM | POA: Diagnosis not present

## 2020-02-18 DIAGNOSIS — R05 Cough: Secondary | ICD-10-CM | POA: Diagnosis not present

## 2020-02-18 DIAGNOSIS — J189 Pneumonia, unspecified organism: Secondary | ICD-10-CM | POA: Diagnosis not present

## 2020-02-18 DIAGNOSIS — Z1152 Encounter for screening for COVID-19: Secondary | ICD-10-CM | POA: Diagnosis not present

## 2020-02-27 ENCOUNTER — Telehealth: Payer: Self-pay

## 2020-02-27 ENCOUNTER — Ambulatory Visit: Payer: Self-pay | Admitting: Pharmacist

## 2020-02-27 NOTE — Chronic Care Management (AMB) (Signed)
  Chronic Care Management   Follow Up Note   02/27/2020 Name: Aaron Sosa MRN: [id number] DOB: 07-31-1941  Referred by: [name], DO Reason for referral : Chronic Care Management (Patient Phone Call)   Aaron Sosa is a 79 y.o. year old male who is a primary care patient of [name], DO. The CCM team was consulted for assistance with chronic disease management and care coordination needs.    Was unable to reach patient via telephone today and have left HIPAA compliant voicemail asking patient to return my call.   Plan  The care management team will reach out to the patient again over the next 30 days.   Duanne Moron, PharmD, [facility] Clinical Pharmacist [facility] [employer] [phone]

## 2020-03-03 DIAGNOSIS — R0602 Shortness of breath: Secondary | ICD-10-CM | POA: Diagnosis not present

## 2020-03-03 DIAGNOSIS — J189 Pneumonia, unspecified organism: Secondary | ICD-10-CM | POA: Diagnosis not present

## 2020-03-15 ENCOUNTER — Ambulatory Visit: Payer: Self-pay | Admitting: Pharmacist

## 2020-03-15 DIAGNOSIS — E1136 Type 2 diabetes mellitus with diabetic cataract: Secondary | ICD-10-CM

## 2020-03-15 NOTE — Chronic Care Management (AMB) (Signed)
Chronic Care Management   Follow Up Note   03/15/2020 Name: Aaron Sosa MRN: [id number] DOB: 02-Jan-1941  Referred by: [name], DO Reason for referral : Chronic Care Management (Patient Phone Call)   Aaron Sosa is a 79 y.o. year old male who is a primary care patient of [name], DO. The CCM team was consulted for assistance with chronic disease management and care coordination needs.    I reached out to [name] by phone today.   Review of patient status, including review of consultants reports, relevant laboratory and other test results, and collaboration with appropriate care team members and the patient's provider was performed as part of comprehensive patient evaluation and provision of chronic care management services.     Outpatient Encounter Medications as of 03/15/2020  Medication Sig  . acetaminophen (TYLENOL) 325 MG tablet Take by mouth.  . calcium carbonate (OSCAL) 1500 (600 Ca) MG TABS tablet Take 600 mg of elemental calcium by mouth 2 (two) times daily with a meal.  . magnesium 30 MG tablet Take 30 mg by mouth once.  . metFORMIN (GLUCOPHAGE-XR) 750 MG 24 hr tablet Take 1 tablet (750 mg total) by mouth 2 (two) times daily with a meal.  . ONETOUCH ULTRA test strip Use to check blood sugar up to 2 x daily   No facility-administered encounter medications on file as of 03/15/2020.    Goals Addressed            This Visit's Progress   . PharmD - "It is depressing to check my blood sugar"       CARE PLAN ENTRY (see longitudinal plan of care for additional care plan information)  Current Barriers:  . Financial . Limited hearing . Limited eyesight - macular degeneration  . Reluctance to take medication/seek medical care  Pharmacist Clinical Goal: Over the next 30 days, patient will work with CM Pharmacist and PCP to address needs related to diabetes management and medication adherence  Interventions:  Perform chart  review  Follow up with patient regarding recent illness ? Reports went back to Urgent Care ~ 2 weeks ago and received further 7-day course antibiotics, Augmentin and doxycycline. States has completed this course at the beginning of this week. ? Reports breathing is improved, but not yet back to normal. Also reports swelling below knees down to feet on both legs.  Encourage patient to follow up with PCP for evaluation of swelling in his legs and shortness of breath. Patient reports planning to go to [name]gent [facility] tomorrow instead  Reports planning to change to a new PCP office. ? Advise patient that accordingly I will discontinue outreach to him at this time as he intends to change his care to a new office. Patient verbalizes understanding.  Will collaborate with PCP and CCM team.  Patient Self Care Activities:   Patient making dietary modifications  Patient to take medications as prescribed  Attends all scheduled provider appointments  Calls pharmacy for medication refills  Patient to check blood sugar regularly and keep log  Patient to check blood pressure daily and keep log   Please see past updates related to this goal by clicking on the "Past Updates" button in the selected goal          Plan  CCM enrollment status changed to "previously enrolled" as per patient request on 03/15/2020 to discontinue enrollment. Case closed to case management services in primary care home.   Duanne Moron,  PharmD, BCACP Clinical Pharmacist [facility] [phone] 

## 2020-03-15 NOTE — Patient Instructions (Signed)
Thank you allowing the Chronic Care Management Team to be a part of your care! It was a pleasure speaking with you today!     CCM (Chronic Care Management) Team    Alto Denver RN, MSN, CCM Nurse Care Coordinator  [phone]   Duanne Moron PharmD  Clinical Pharmacist  [phone]   Dickie La LCSW Clinical Social Worker [phone]  Visit Information  Goals Addressed            This Visit's Progress   . PharmD - "It is depressing to check my blood sugar"       CARE PLAN ENTRY (see longitudinal plan of care for additional care plan information)  Current Barriers:  . Financial . Limited hearing . Limited eyesight - macular degeneration  . Reluctance to take medication/seek medical care  Pharmacist Clinical Goal: Over the next 30 days, patient will work with CM Pharmacist and PCP to address needs related to diabetes management and medication adherence  Interventions:  Perform chart review  Follow up with patient regarding recent illness ? Reports went back to Urgent Care ~ 2 weeks ago and received further 7-day course antibiotics, Augmentin and doxycycline. States has completed this course at the beginning of this week. ? Reports breathing is improved, but not yet back to normal. Also reports swelling below knees down to feet on both legs.  Encourage patient to follow up with PCP for evaluation of swelling in his legs and shortness of breath. Patient reports planning to go to [facility] tomorrow instead  Reports planning to change to a new PCP office. ? Advise patient that accordingly I will discontinue outreach to him at this time as he intends to change his care to a new office. Patient verbalizes understanding.  Will collaborate with PCP and CCM team.  Patient Self Care Activities:   Patient making dietary modifications  Patient to take medications as prescribed  Attends all scheduled provider appointments  Calls pharmacy for  medication refills  Patient to check blood sugar regularly and keep log  Patient to check blood pressure daily and keep log   Please see past updates related to this goal by clicking on the "Past Updates" button in the selected goal          Patient verbalizes understanding of instructions provided today.   CCM enrollment status changed to "previously enrolled" as per patient request on 03/15/2020 to discontinue enrollment. Case closed to case management services in primary care home.   Duanne Moron, PharmD, [facility] Clinical Pharmacist [facility] [employer] [phone]

## 2020-03-16 ENCOUNTER — Telehealth: Payer: Self-pay

## 2020-03-16 ENCOUNTER — Other Ambulatory Visit: Payer: Self-pay

## 2020-03-16 ENCOUNTER — Inpatient Hospital Stay
Admission: EM | Admit: 2020-03-16 | Discharge: 2020-03-18 | DRG: 280 | Disposition: A | Discharge status: Home / Self Care | Payer: PPO | Attending: Internal Medicine | Admitting: Internal Medicine

## 2020-03-16 ENCOUNTER — Emergency Department: Payer: PPO

## 2020-03-16 DIAGNOSIS — I2510 Atherosclerotic heart disease of native coronary artery without angina pectoris: Secondary | ICD-10-CM | POA: Diagnosis present

## 2020-03-16 DIAGNOSIS — Z79899 Other long term (current) drug therapy: Secondary | ICD-10-CM

## 2020-03-16 DIAGNOSIS — I214 Non-ST elevation (NSTEMI) myocardial infarction: Principal | ICD-10-CM | POA: Diagnosis present

## 2020-03-16 DIAGNOSIS — E669 Obesity, unspecified: Secondary | ICD-10-CM | POA: Diagnosis present

## 2020-03-16 DIAGNOSIS — Z885 Allergy status to narcotic agent status: Secondary | ICD-10-CM

## 2020-03-16 DIAGNOSIS — R0602 Shortness of breath: Secondary | ICD-10-CM | POA: Diagnosis not present

## 2020-03-16 DIAGNOSIS — E1169 Type 2 diabetes mellitus with other specified complication: Secondary | ICD-10-CM | POA: Diagnosis not present

## 2020-03-16 DIAGNOSIS — I255 Ischemic cardiomyopathy: Secondary | ICD-10-CM | POA: Diagnosis not present

## 2020-03-16 DIAGNOSIS — I509 Heart failure, unspecified: Secondary | ICD-10-CM

## 2020-03-16 DIAGNOSIS — Z8249 Family history of ischemic heart disease and other diseases of the circulatory system: Secondary | ICD-10-CM | POA: Diagnosis not present

## 2020-03-16 DIAGNOSIS — Z20822 Contact with and (suspected) exposure to covid-19: Secondary | ICD-10-CM | POA: Diagnosis not present

## 2020-03-16 DIAGNOSIS — R079 Chest pain, unspecified: Secondary | ICD-10-CM | POA: Diagnosis not present

## 2020-03-16 DIAGNOSIS — E785 Hyperlipidemia, unspecified: Secondary | ICD-10-CM | POA: Diagnosis present

## 2020-03-16 DIAGNOSIS — I5021 Acute systolic (congestive) heart failure: Secondary | ICD-10-CM | POA: Diagnosis not present

## 2020-03-16 DIAGNOSIS — I10 Essential (primary) hypertension: Secondary | ICD-10-CM | POA: Diagnosis present

## 2020-03-16 DIAGNOSIS — Z833 Family history of diabetes mellitus: Secondary | ICD-10-CM

## 2020-03-16 DIAGNOSIS — M1990 Unspecified osteoarthritis, unspecified site: Secondary | ICD-10-CM | POA: Diagnosis not present

## 2020-03-16 DIAGNOSIS — I272 Pulmonary hypertension, unspecified: Secondary | ICD-10-CM | POA: Diagnosis present

## 2020-03-16 DIAGNOSIS — I110 Hypertensive heart disease with heart failure: Secondary | ICD-10-CM | POA: Diagnosis present

## 2020-03-16 DIAGNOSIS — E11319 Type 2 diabetes mellitus with unspecified diabetic retinopathy without macular edema: Secondary | ICD-10-CM | POA: Diagnosis present

## 2020-03-16 DIAGNOSIS — Z888 Allergy status to other drugs, medicaments and biological substances status: Secondary | ICD-10-CM

## 2020-03-16 DIAGNOSIS — I5023 Acute on chronic systolic (congestive) heart failure: Secondary | ICD-10-CM | POA: Diagnosis not present

## 2020-03-16 DIAGNOSIS — H9190 Unspecified hearing loss, unspecified ear: Secondary | ICD-10-CM | POA: Diagnosis not present

## 2020-03-16 DIAGNOSIS — Z87891 Personal history of nicotine dependence: Secondary | ICD-10-CM

## 2020-03-16 DIAGNOSIS — Z6836 Body mass index (BMI) 36.0-36.9, adult: Secondary | ICD-10-CM

## 2020-03-16 DIAGNOSIS — Z7984 Long term (current) use of oral hypoglycemic drugs: Secondary | ICD-10-CM

## 2020-03-16 DIAGNOSIS — Z91013 Allergy to seafood: Secondary | ICD-10-CM

## 2020-03-16 DIAGNOSIS — R600 Localized edema: Secondary | ICD-10-CM | POA: Diagnosis present

## 2020-03-16 DIAGNOSIS — R778 Other specified abnormalities of plasma proteins: Secondary | ICD-10-CM

## 2020-03-16 DIAGNOSIS — R7989 Other specified abnormal findings of blood chemistry: Secondary | ICD-10-CM | POA: Diagnosis not present

## 2020-03-16 LAB — PROTIME-INR
INR: 1.1 (ref 0.8–1.2)
Prothrombin Time: 13.9 seconds (ref 11.4–15.2)

## 2020-03-16 LAB — TSH: TSH: 1.401 u[IU]/mL (ref 0.350–4.500)

## 2020-03-16 LAB — GLUCOSE, CAPILLARY: Glucose-Capillary: 135 mg/dL — ABNORMAL HIGH (ref 70–99)

## 2020-03-16 LAB — CBC
HCT: 40.9 % (ref 39.0–52.0)
Hemoglobin: 13.1 g/dL (ref 13.0–17.0)
MCH: 28.2 pg (ref 26.0–34.0)
MCHC: 32 g/dL (ref 30.0–36.0)
MCV: 88.1 fL (ref 80.0–100.0)
Platelets: 238 10*3/uL (ref 150–400)
RBC: 4.64 MIL/uL (ref 4.22–5.81)
RDW: 14.6 % (ref 11.5–15.5)
WBC: 9.5 10*3/uL (ref 4.0–10.5)
nRBC: 0 % (ref 0.0–0.2)

## 2020-03-16 LAB — BRAIN NATRIURETIC PEPTIDE: B Natriuretic Peptide: 1650.5 pg/mL — ABNORMAL HIGH (ref 0.0–100.0)

## 2020-03-16 LAB — TROPONIN I (HIGH SENSITIVITY)
Troponin I (High Sensitivity): 44 ng/L — ABNORMAL HIGH (ref <18)
Troponin I (High Sensitivity): 275 ng/L (ref <18)
Troponin I (High Sensitivity): 792 ng/L (ref <18)
Troponin I (High Sensitivity): 2091 ng/L (ref <18)
Troponin I (High Sensitivity): 2223 ng/L (ref <18)

## 2020-03-16 LAB — BASIC METABOLIC PANEL
Anion gap: 9 (ref 5–15)
BUN: 16 mg/dL (ref 8–23)
CO2: 25 mmol/L (ref 22–32)
Calcium: 8.4 mg/dL — ABNORMAL LOW (ref 8.9–10.3)
Chloride: 104 mmol/L (ref 98–111)
Creatinine, Ser: 1 mg/dL (ref 0.61–1.24)
GFR calc Af Amer: >60 mL/min (ref >60)
GFR calc non Af Amer: >60 mL/min (ref >60)
Glucose, Bld: 202 mg/dL — ABNORMAL HIGH (ref 70–99)
Potassium: 4 mmol/L (ref 3.5–5.1)
Sodium: 138 mmol/L (ref 135–145)

## 2020-03-16 LAB — LIPID PANEL
Cholesterol: 171 mg/dL (ref 0–200)
HDL: 43 mg/dL (ref >40)
LDL Cholesterol: 103 mg/dL — ABNORMAL HIGH (ref 0–99)
Total CHOL/HDL Ratio: 4 RATIO
Triglycerides: 123 mg/dL (ref <150)
VLDL: 25 mg/dL (ref 0–40)

## 2020-03-16 LAB — SARS CORONAVIRUS 2 BY RT PCR (HOSPITAL ORDER, PERFORMED IN ~~LOC~~ HOSPITAL LAB): SARS Coronavirus 2: NEGATIVE

## 2020-03-16 LAB — APTT: aPTT: 28 seconds (ref 24–36)

## 2020-03-16 MED ORDER — ONDANSETRON HCL 4 MG/2ML IJ SOLN: 4.0000 mg | Freq: Four times a day (QID) | INTRAMUSCULAR | Status: DC | PRN

## 2020-03-16 MED ORDER — SODIUM CHLORIDE 0.9% FLUSH: 3.0000 mL | Freq: Once | INTRAVENOUS | Status: DC

## 2020-03-16 MED ORDER — ASPIRIN 81 MG PO CHEW
324.0000 mg | CHEWABLE_TABLET | Freq: Once | ORAL | Status: AC
  Administered 2020-03-16: 324 mg via ORAL
  Filled 2020-03-16: qty 4

## 2020-03-16 MED ORDER — DOCUSATE SODIUM 100 MG PO CAPS: 100.0000 mg | ORAL_CAPSULE | Freq: Two times a day (BID) | ORAL | Status: DC | PRN

## 2020-03-16 MED ORDER — FUROSEMIDE 10 MG/ML IJ SOLN
40.0000 mg | Freq: Once | INTRAMUSCULAR | Status: AC
  Administered 2020-03-16: 40 mg via INTRAVENOUS
  Filled 2020-03-16: qty 4

## 2020-03-16 MED ORDER — POLYETHYLENE GLYCOL 3350 17 G PO PACK: 17.0000 g | PACK | Freq: Two times a day (BID) | ORAL | Status: DC | PRN

## 2020-03-16 MED ORDER — PNEUMOCOCCAL VAC POLYVALENT 25 MCG/0.5ML IJ INJ
0.5000 mL | INJECTION | INTRAMUSCULAR | Status: DC
  Filled 2020-03-16: qty 0.5

## 2020-03-16 MED ORDER — ACETAMINOPHEN 500 MG PO TABS: 1000.0000 mg | ORAL_TABLET | Freq: Three times a day (TID) | ORAL | Status: DC | PRN

## 2020-03-16 MED ORDER — ENOXAPARIN SODIUM 40 MG/0.4ML ~~LOC~~ SOLN: 40.0000 mg | SUBCUTANEOUS | Status: DC

## 2020-03-16 MED ORDER — INSULIN ASPART 100 UNIT/ML ~~LOC~~ SOLN
0.0000 [IU] | Freq: Three times a day (TID) | SUBCUTANEOUS | Status: DC
  Administered 2020-03-17: 2 [IU] via SUBCUTANEOUS
  Administered 2020-03-18: 3 [IU] via SUBCUTANEOUS
  Filled 2020-03-16 (×2): qty 1

## 2020-03-16 MED ORDER — CALCIUM CARBONATE ANTACID 500 MG PO CHEW: 1.0000 | CHEWABLE_TABLET | Freq: Three times a day (TID) | ORAL | Status: DC | PRN

## 2020-03-16 MED ORDER — HEPARIN (PORCINE) 25000 UT/250ML-% IV SOLN
1350.0000 [IU]/h | INTRAVENOUS | Status: DC
  Administered 2020-03-16: 1150 [IU]/h via INTRAVENOUS
  Administered 2020-03-17: 1350 [IU]/h via INTRAVENOUS
  Filled 2020-03-16 (×2): qty 250

## 2020-03-16 MED ORDER — HEPARIN BOLUS VIA INFUSION
4000.0000 [IU] | Freq: Once | INTRAVENOUS | Status: AC
  Administered 2020-03-16: 4000 [IU] via INTRAVENOUS
  Filled 2020-03-16: qty 4000

## 2020-03-16 MED ORDER — GUAIFENESIN-DM 100-10 MG/5ML PO SYRP
10.0000 mL | ORAL_SOLUTION | Freq: Four times a day (QID) | ORAL | Status: DC | PRN
  Filled 2020-03-16: qty 10

## 2020-03-16 MED ORDER — ONDANSETRON 4 MG PO TBDP: 4.0000 mg | ORAL_TABLET | Freq: Three times a day (TID) | ORAL | Status: DC | PRN

## 2020-03-16 MED ORDER — ALUM & MAG HYDROXIDE-SIMETH 200-200-20 MG/5ML PO SUSP: 15.0000 mL | Freq: Four times a day (QID) | ORAL | Status: DC | PRN

## 2020-03-16 NOTE — Progress Notes (Signed)
ANTICOAGULATION CONSULT NOTE  Pharmacy Consult for heparin Indication: chest pain/ACS  Allergies  Allergen Reactions  . Atorvastatin Other (See Comments)    myalgia  . Codeine   . Shrimp [Shellfish Allergy]     Patient Measurements: Height: 5\' 8"  (172.7 cm) Weight: 108 kg (238 lb) IBW/kg (Calculated) : 68.4 Heparin Dosing Weight: 92 kg  Vital Signs: Temp: 98.7 F (37.1 C) (05/18 0944) Temp Source: Oral (05/18 0944) BP: 130/94 (05/18 1424) Pulse Rate: 76 (05/18 1424)  Labs: Recent Labs    03/16/20 0950 03/16/20 1242 03/16/20 1448  HGB 13.1  --   --   HCT 40.9  --   --   PLT 238  --   --   CREATININE 1.00  --   --   TROPONINIHS 44* 275* 792*    Estimated Creatinine Clearance: 72.5 mL/min (by C-G formula based on SCr of 1 mg/dL).   Medical History: Past Medical History:  Diagnosis Date  . Arthritis   . Diabetes mellitus without complication (HCC)   . HOH (hard of hearing)   . Lower extremity edema     Assessment: 78 year old male admitted with SOB and chest pain, swelling in lower extremities. Cardiology consult. Pharmacy consulted to start heparin. Patient is not on anticoagulation PTA.  Goal of Therapy:  Heparin level 0.3-0.7 units/ml Monitor platelets by anticoagulation protocol: Yes   Plan:  Heparin 4000 unit bolus followed by heparin drip at 1150 units/hr. HL 5/19 at 0200. CBC daily while on heparin drip.  6/19, PharmD 03/16/2020,5:33 PM

## 2020-03-16 NOTE — Consult Note (Signed)
[facility] Cardiology Consultation Note  Patient ID: Aaron Sosa, MRN: [id number], DOB/AGE: 05-Mar-1941 79 y.o. Admit date: 03/16/2020   Date of Consult: 03/16/2020 Primary Physician: Olin Hauser, DO Primary Cardiologist: None  Chief Complaint:  Chief Complaint  Patient presents with  . Shortness of Breath   Reason for Consult: Heart failure  HPI: 79 y.o. male with known hypertension hyperlipidemia diabetes who has had a significant progression of shortness of breath lower extremity edema PND and orthopnea over the last several weeks.  He had some treatment with steroids and antibiotics but did not get any better.  When seen in the emergency room the patient did have an EKG showing normal sinus rhythm with poor R wave progression cannot rule out inferior infarct age undetermined.  Troponin level has peaked out at 792 possibly consistent with demand ischemia although will keep an eye open for further issues.  Chest x-ray shows pulmonary edema.  Exam suggest that the patient has acute congestive heart failure with possible plus or minus myocardial infarction.  Currently after intravenous Lasix the patient has had some improvements of symptoms  Past Medical History:  Diagnosis Date  . Arthritis   . Diabetes mellitus without complication ([place])   . HOH (hard of hearing)   . Lower extremity edema       Surgical History:  Past Surgical History:  Procedure Laterality Date  . CATARACT EXTRACTION Left   . CATARACT EXTRACTION W/PHACO Right 05/06/2015   Procedure: CATARACT EXTRACTION PHACO AND INTRAOCULAR LENS PLACEMENT (IOC);  Surgeon: Lyla Glassing, MD;  Location: [facility] ORS;  Service: Ophthalmology;  Laterality: Right;  [place] 1:14.7         AP   11.2          CDE   8.43    cassette lot #[id number]  . RETINAL DETACHMENT SURGERY    . WRIST FRACTURE SURGERY  1958     Home Meds: Prior to Admission medications   Medication Sig Start Date End Date Taking? Authorizing Provider   acetaminophen (TYLENOL) 325 MG tablet Take 325-650 mg by mouth every 6 (six) hours as needed for mild pain or fever.    Yes [provider]  calcium carbonate (OSCAL) 1500 (600 Ca) MG TABS tablet Take 600 mg of elemental calcium by mouth 2 (two) times daily with a meal.   Yes [provider]  magnesium 30 MG tablet Take 30 mg by mouth once.   Yes [provider]  metFORMIN (GLUCOPHAGE-XR) 750 MG 24 hr tablet Take 1 tablet (750 mg total) by mouth 2 (two) times daily with a meal. 01/14/20  Yes Karamalegos, Devonne Doughty, DO  ONETOUCH ULTRA test strip Use to check blood sugar up to 2 x daily 06/13/19  Yes Karamalegos, Devonne Doughty, DO  PROAIR HFA 108 (90 Base) MCG/ACT inhaler Inhale 2 puffs into the lungs every 4 (four) hours as needed for shortness of breath or wheezing. 03/03/20  Yes [provider]    Inpatient Medications:  . sodium chloride flush  3 mL Intravenous Once     Allergies:  Allergies  Allergen Reactions  . Atorvastatin Other (See Comments)    myalgia  . Codeine   . Shrimp [Shellfish Allergy]     Social History   Socioeconomic History  . Marital status: Widowed    Spouse name: Not on file  . Number of children: Not on file  . Years of education: [employer]  . Highest education level: High school graduate  Occupational History  . Not on file  Tobacco Use  . Smoking status: Former Smoker    Packs/day: 2.00    Years: 5.00    Pack years: 10.00    Types: Cigarettes  . Smokeless tobacco: Former Engineer, water and Sexual Activity  . Alcohol use: Yes    Alcohol/week: 1.0 standard drinks    Types: 1 Cans of beer per week  . Drug use: Never  . Sexual activity: Not on file  Other Topics Concern  . Not on file  Social History Narrative  . Not on file   Social Determinants of Health   Financial Resource Strain:   . Difficulty of Paying Living Expenses:   Food Insecurity:   . Worried About Programme researcher, broadcasting/film/video in the Last Year:   .  Barista in the Last Year:   Transportation Needs:   . Freight forwarder (Medical):   Marland Kitchen Lack of Transportation (Non-Medical):   Physical Activity: Inactive  . Days of Exercise per Week: 0 days  . Minutes of Exercise per Session: 0 min  Stress:   . Feeling of Stress :   Social Connections:   . Frequency of Communication with Friends and Family:   . Frequency of Social Gatherings with Friends and Family:   . Attends Religious Services:   . Active Member of Clubs or Organizations:   . Attends Banker Meetings:   Marland Kitchen Marital Status:   Intimate Partner Violence:   . Fear of Current or Ex-Partner:   . Emotionally Abused:   Marland Kitchen Physically Abused:   . Sexually Abused:      No family history on file.   Review of Systems Positive for shortness of breath PND Negative for: General:  chills, fever, night sweats or weight changes.  Cardiovascular: Positive for PND orthopnea negative for syncope dizziness  Dermatological skin lesions rashes Respiratory: Cough congestion Urologic: Frequent urination urination at night and hematuria Abdominal: negative for nausea, vomiting, diarrhea, bright red blood per rectum, melena, or hematemesis Neurologic: negative for visual changes, and/or hearing changes  All other systems reviewed and are otherwise negative except as noted above.  Labs: No results for input(s): CKTOTAL, CKMB, TROPONINI in the last 72 hours. Lab Results  Component Value Date   WBC 9.5 03/16/2020   HGB 13.1 03/16/2020   HCT 40.9 03/16/2020   MCV 88.1 03/16/2020   PLT 238 03/16/2020    Recent Labs  Lab 03/16/20 0950  NA 138  K 4.0  CL 104  CO2 25  BUN 16  CREATININE 1.00  CALCIUM 8.4*  GLUCOSE 202*   Lab Results  Component Value Date   CHOL 234 (H) 12/02/2019   HDL 57 12/02/2019   LDLCALC 151 (H) 12/02/2019   TRIG 140 12/02/2019   No results found for: DDIMER  Radiology/Studies:  DG Chest 2 View  Result Date: 03/16/2020 CLINICAL DATA:   Shortness of breath, chest pain. EXAM: CHEST - 2 VIEW COMPARISON:  None. FINDINGS: The heart size and mediastinal contours are within normal limits. No pneumothorax or pleural effusion is noted. Mild bibasilar subsegmental atelectasis or edema is noted. The visualized skeletal structures are unremarkable. IMPRESSION: Mild bibasilar subsegmental atelectasis or edema. Electronically Signed   By: Lupita Raider M.D.   On: 03/16/2020 10:29    EKG: Normal sinus rhythm with poor R wave progression cannot rule out inferior infarct age undetermined  Weights: Filed Weights   03/16/20 0945  Weight: 108 kg  Physical Exam: Blood pressure (!) 130/94, pulse 76, temperature 98.7 F (37.1 C), temperature source Oral, resp. rate (!) 22, height 5\' 8"  (1.727 m), weight 108 kg, SpO2 99 %. Body mass index is 36.19 kg/m. General: Well developed, well nourished, in no acute distress. Head eyes ears nose throat: Normocephalic, atraumatic, sclera non-icteric, no xanthomas, nares are without discharge. No apparent thyromegaly and/or mass  Lungs: Normal respiratory effort.  Few wheezes, letter rales, no rhonchi.  Heart: RRR with normal S1 S2. no murmur gallop, no rub, PMI is normal size and placement, carotid upstroke normal without bruit, jugular venous pressure is normal Abdomen: Soft, non-tender, non-distended with normoactive bowel sounds. No hepatomegaly. No rebound/guarding. No obvious abdominal masses. Abdominal aorta is normal size without bruit Extremities: 1+ edema. no cyanosis, no clubbing, no ulcers  Peripheral : 2+ bilateral upper extremity pulses, 2+ bilateral femoral pulses, 2+ bilateral dorsal pedal pulse Neuro: Alert and oriented. No facial asymmetry. No focal deficit. Moves all extremities spontaneously. Musculoskeletal: Normal muscle tone without kyphosis Psych:  Responds to questions appropriately with a normal affect.    Assessment: 79 year old male with acute systolic dysfunction  congestive heart failure elevated troponin possibly consistent with demand ischemia with diabetes hypertension hyperlipidemia needing further treatment options  Plan: 1.  Continue intravenous Lasix for pulmonary edema lower extremity edema and acute systolic dysfunction heart failure 2.  Echocardiogram for LV systolic dysfunction valvular heart disease contributing to above 3.  Beta-blocker ACE inhibitor as able for blood pressure control and cardiomyopathy treatment if necessary 4.  Begin ambulation and further treatment options after above  Signed, 70 M.D. [facility] Cardiology 03/16/2020, 3:49 PM

## 2020-03-16 NOTE — ED Triage Notes (Signed)
Pt c/o SOB with chest pain, swelling in LE and abd over the past 2 weeks, states he went to [facility] and completed abx and steroids. Denies hx of CHF.Marland Kitchenstates they had dx him with PNE.Marland Kitchen

## 2020-03-16 NOTE — ED Notes (Signed)
Assigned bed @ [id number], spoke with [employer].

## 2020-03-16 NOTE — ED Notes (Signed)
Pt given meal tray.

## 2020-03-16 NOTE — H&P (Addendum)
History and Physical    Aaron Sosa:[id number] DOB: September 26, 1941 DOA: 03/16/2020  PCP: [name], DO  Patient coming from: home  I have personally briefly reviewed patient's old medical records in [facility]  Chief Complaint: dyspnea  HPI: Aaron Sosa is a 79 y.o. male with medical history significant of DM2, HTN, former smoker who presented with worsening dyspnea.     Pt reported he was at his baseline health when about 3 weeks ago, he started having some cough, for which he went to the urgent care clinic on 5/5 and received some abx and steroid.  Pt said his legs swelled up and he started having dyspnea after 2 days of steroid.  Pt reported going to urgent care again and received another course of abx.  Pt's dyspnea worsened, therefore he presented to the ED.  No fever, chest pain, abdominal pain, N/V/D, dysuria.  Chronic constipation.  No hx of heart problems or CHF.    ED Course: initial vitals: afebrile, pulse 101, BP 121/71, sating 97% on room air.  Labs notable for normal WBC, BNP 1650, initial trop 44 and up trending.  EKG no acute ACS-related changes.  CXR showed "Mild bibasilar subsegmental atelectasis or edema."  Pt received ASA 324 and IV lasix 40 mg in the ED before admission.   Assessment/Plan Active Problems:   Acute exacerbation of CHF (congestive heart failure) (HCC)  # NSTEMI --Denied chest pain, but has dyspnea. --Trop 44, 275, 792. PLAN: --Due to significant rise in trop, will start heparin gtt, pharm to dose --continue to trend trop --TTE --lipid profile, TSH --NPO MN for possible heart cath in the morning  # Volume overload likely 2/2 CHF exacerbation --No prior hx of CHF, however, has severe LE edema and mild pulm edema with BNP 1650.   --Received IV Lasix 40 mg in the ED PLAN: --2nd dose of IV Lasix 40 mg --Strict I/O --TTE  # DM2 --Recent A1c 7.7.  BG 202 on presentation. --Hold home metformin --ACHS and SSI TID  # Hx of  HTN --not currently on BP meds.   --Monitor BP   DVT prophylaxis: [id number] gtt Code Status: Full code  Family Communication:   Disposition Plan: home  Consults called: cardiology Admission status: Inpatient   Review of Systems: As per HPI otherwise 10 point review of systems negative.   Past Medical History:  Diagnosis Date  . Arthritis   . Diabetes mellitus without complication (HCC)   . HOH (hard of hearing)   . Lower extremity edema     Past Surgical History:  Procedure Laterality Date  . CATARACT EXTRACTION Left   . CATARACT EXTRACTION W/PHACO Right 05/06/2015   Procedure: CATARACT EXTRACTION PHACO AND INTRAOCULAR LENS PLACEMENT (IOC);  Surgeon: Lia Hopping, MD;  Location: [facility] ORS;  Service: Ophthalmology;  Laterality: Right;  [place] 1:14.7         AP   11.2          CDE   8.43    cassette lot #[id number]  . RETINAL DETACHMENT SURGERY    . WRIST FRACTURE SURGERY  1958     reports that he has quit smoking. His smoking use included cigarettes. He has a 10.00 pack-year smoking history. He has quit using smokeless tobacco. He reports current alcohol use of about 1.0 standard drinks of alcohol per week. He reports that he does not use drugs.  Allergies  Allergen Reactions  . Atorvastatin Other (See Comments)  myalgia  . Codeine   . Shrimp [Shellfish Allergy]     Family History  Problem Relation Age of Onset  . Heart attack Mother   . Diabetes Mellitus II Mother   . Heart attack Father     Prior to Admission medications   Medication Sig Start Date End Date Taking? Authorizing Provider  acetaminophen (TYLENOL) 325 MG tablet Take 325-650 mg by mouth every 6 (six) hours as needed for mild pain or fever.    Yes [provider]  calcium carbonate (OSCAL) 1500 (600 Ca) MG TABS tablet Take 600 mg of elemental calcium by mouth 2 (two) times daily with a meal.   Yes [provider]  magnesium 30 MG tablet Take 30 mg by mouth once.   Yes [provider]  metFORMIN (GLUCOPHAGE-XR) 750 MG 24 hr tablet Take 1 tablet (750 mg total) by mouth 2 (two) times daily with a meal. 01/14/20  Yes Karamalegos, Netta Neat, DO  ONETOUCH ULTRA test strip Use to check blood sugar up to 2 x daily 06/13/19  Yes Karamalegos, Netta Neat, DO  PROAIR HFA 108 (90 Base) MCG/ACT inhaler Inhale 2 puffs into the lungs every 4 (four) hours as needed for shortness of breath or wheezing. 03/03/20  Yes [provider]    Physical Exam: Vitals:   03/16/20 1630 03/16/20 1752 03/16/20 1942 03/16/20 2022  BP: 124/85  129/90 (!) 139/91  Pulse: 70 85 87 87  Resp: (!) 23 13 18 20   Temp:    97.7 F (36.5 C)  TempSrc:    Oral  SpO2: 98% 98% 97% 96%  Weight:      Height:        Constitutional: NAD, AAOx3 HEENT: conjunctivae and lids normal, EOMI CV: RRR no M,R,G. Distal pulses +2.  No cyanosis.   RESP: CTA B/L, normal respiratory effort, on RA GI: +BS, NTND Extremities: 3+ pitting edema up to knees in BLE SKIN: warm, dry and intact Neuro: II - XII grossly intact.  Sensation intact Psych: Normal mood and affect.  Appropriate judgement and reason   Labs on Admission: I have personally reviewed following labs and imaging studies  CBC: Recent Labs  Lab 03/16/20 0950  WBC 9.5  HGB 13.1  HCT 40.9  MCV 88.1  PLT 238   Basic Metabolic Panel: Recent Labs  Lab 03/16/20 0950  NA 138  K 4.0  CL 104  CO2 25  GLUCOSE 202*  BUN 16  CREATININE 1.00  CALCIUM 8.4*   GFR: Estimated Creatinine Clearance: 72.5 mL/min (by C-G formula based on SCr of 1 mg/dL). Liver Function Tests: No results for input(s): AST, ALT, ALKPHOS, BILITOT, PROT, ALBUMIN in the last 168 hours. No results for input(s): LIPASE, AMYLASE in the last 168 hours. No results for input(s): AMMONIA in the last 168 hours. Coagulation Profile: Recent Labs  Lab 03/16/20 1747  INR 1.1   Cardiac Enzymes: No results for input(s): CKTOTAL, CKMB, CKMBINDEX, TROPONINI in the last 168  hours. BNP (last 3 results) No results for input(s): PROBNP in the last 8760 hours. HbA1C: No results for input(s): HGBA1C in the last 72 hours. CBG: No results for input(s): GLUCAP in the last 168 hours. Lipid Profile: No results for input(s): CHOL, HDL, LDLCALC, TRIG, CHOLHDL, LDLDIRECT in the last 72 hours. Thyroid Function Tests: No results for input(s): TSH, T4TOTAL, FREET4, T3FREE, THYROIDAB in the last 72 hours. Anemia Panel: No results for input(s): VITAMINB12, FOLATE, FERRITIN, TIBC, IRON, RETICCTPCT in the last  72 hours. Urine analysis: No results found for: COLORURINE, APPEARANCEUR, LABSPEC, [place], GLUCOSEU, HGBUR, BILIRUBINUR, KETONESUR, PROTEINUR, UROBILINOGEN, NITRITE, LEUKOCYTESUR  Radiological Exams on Admission: DG Chest 2 View  Result Date: 03/16/2020 CLINICAL DATA:  Shortness of breath, chest pain. EXAM: CHEST - 2 VIEW COMPARISON:  None. FINDINGS: The heart size and mediastinal contours are within normal limits. No pneumothorax or pleural effusion is noted. Mild bibasilar subsegmental atelectasis or edema is noted. The visualized skeletal structures are unremarkable. IMPRESSION: Mild bibasilar subsegmental atelectasis or edema. Electronically Signed   By: Marijo Conception M.D.   On: 03/16/2020 10:29      Enzo Bi MD [facility]  If 7PM-7AM, please contact night-coverage 03/16/2020, 9:41 PM

## 2020-03-16 NOTE — ED Provider Notes (Signed)
[facility] Emergency Department Provider Note   ____________________________________________   First MD Initiated Contact with Patient 03/16/20 1236     (approximate)  I have reviewed the triage vital signs and the nursing notes.   HISTORY  Chief Complaint Shortness of Breath    HPI ROCKWELL ZENTZ is a 79 y.o. male with possible history of hypertension, diabetes, and hyperlipidemia who presents to the ED complaining of shortness of breath.  Patient reports that he has been dealing with increased dyspnea on exertion over the past 1 to 2 weeks.  During this time, he has also noticed increased swelling to both of his legs.  He has noticed some pressure in his chest when he goes to walk, but denies any chest pain at rest or while sitting on stretcher.  He reports being seen in urgent care, where they were concerned for pneumonia and he was started on antibiotics and steroids.  His symptoms have continued to worsen since then and he is now unable to walk more than 100 feet without having to stop for air.  He denies any prior cardiac history.        Past Medical History:  Diagnosis Date  . Arthritis   . Diabetes mellitus without complication (HCC)   . HOH (hard of hearing)   . Lower extremity edema     Patient Active Problem List   Diagnosis Date Noted  . Diabetic retinopathy (HCC) 05/23/2019  . Myalgia due to statin 03/12/2019  . Obesity (BMI 30.0-34.9) 12/30/2018  . Essential hypertension 08/14/2018  . 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 Hyperlipidemia associated with type 2 diabetes mellitus (HCC) 08/14/2018  . Lower extremity edema 08/14/2018  . Hearing loss of both ears 08/14/2018    Past Surgical History:  Procedure Laterality Date  . CATARACT EXTRACTION Left   . CATARACT EXTRACTION W/PHACO Right 05/06/2015   Procedure: CATARACT EXTRACTION PHACO AND INTRAOCULAR LENS PLACEMENT (IOC);  Surgeon: Lia Hopping, MD;   Location: [facility] ORS;  Service: Ophthalmology;  Laterality: Right;  [place] 1:14.7         AP   11.2          CDE   8.43    cassette lot #[id number]  . RETINAL DETACHMENT SURGERY    . WRIST FRACTURE SURGERY  1958    Prior to Admission medications   Medication Sig Start Date End Date Taking? Authorizing Provider  acetaminophen (TYLENOL) 325 MG tablet Take 325-650 mg by mouth every 6 (six) hours as needed for mild pain or fever.    Yes [provider]  calcium carbonate (OSCAL) 1500 (600 Ca) MG TABS tablet Take 600 mg of elemental calcium by mouth 2 (two) times daily with a meal.   Yes [provider]  magnesium 30 MG tablet Take 30 mg by mouth once.   Yes [provider]  metFORMIN (GLUCOPHAGE-XR) 750 MG 24 hr tablet Take 1 tablet (750 mg total) by mouth 2 (two) times daily with a meal. 01/14/20  Yes Karamalegos, Netta Neat, DO  ONETOUCH ULTRA test strip Use to check blood sugar up to 2 x daily 06/13/19  Yes Karamalegos, Netta Neat, DO  PROAIR HFA 108 (90 Base) MCG/ACT inhaler Inhale 2 puffs into the lungs every 4 (four) hours as needed for shortness of breath or wheezing. 03/03/20  Yes [provider]    Allergies Atorvastatin, Codeine, and Shrimp [shellfish allergy]  No family history on file.  Social History Social  History   Tobacco Use  . Smoking status: Former Smoker    Packs/day: 2.00    Years: 5.00    Pack years: 10.00    Types: Cigarettes  . Smokeless tobacco: Former Network engineer Use Topics  . Alcohol use: Yes    Alcohol/week: 1.0 standard drinks    Types: 1 Cans of beer per week  . Drug use: Never    Review of Systems  Constitutional: No fever/chills Eyes: No visual changes. ENT: No sore throat. Cardiovascular: Positive for chest pain. Respiratory: Positive for shortness of breath. Gastrointestinal: No abdominal pain.  No nausea, no vomiting.  No diarrhea.  No constipation. Genitourinary: Negative for dysuria. Musculoskeletal:  Negative for back pain.  Positive for leg swelling. Skin: Negative for rash. Neurological: Negative for headaches, focal weakness or numbness.  ____________________________________________   PHYSICAL EXAM:  VITAL SIGNS: ED Triage Vitals  Enc Vitals Group     BP 03/16/20 0944 121/71     Pulse Rate 03/16/20 0944 (!) 101     Resp --      Temp 03/16/20 0944 98.7 F (37.1 C)     Temp Source 03/16/20 0944 Oral     SpO2 03/16/20 0944 97 %     Weight 03/16/20 0945 238 lb (108 kg)     Height 03/16/20 0945 5\' 8"  (1.727 m)     Head Circumference --      Peak Flow --      Pain Score 03/16/20 0957 0     Pain Loc --      Pain Edu? --      Excl. in [place]? --     Constitutional: Alert and oriented. Eyes: Conjunctivae are normal. Head: Atraumatic. Nose: No congestion/rhinnorhea. Mouth/Throat: Mucous membranes are moist. Neck: Normal ROM Cardiovascular: Normal rate, regular rhythm. Grossly normal heart sounds. Respiratory: Mild tachypnea with normal respiratory effort.  No retractions. Lungs with crackles to bilateral bases. Gastrointestinal: Soft and nontender. No distention. Genitourinary: deferred Musculoskeletal: 2+ pitting edema to knees bilaterally with no associated tenderness. Neurologic:  Normal speech and language. No gross focal neurologic deficits are appreciated. Skin:  Skin is warm, dry and intact. No rash noted. Psychiatric: Mood and affect are normal. Speech and behavior are normal.  ____________________________________________   LABS (all labs ordered are listed, but only abnormal results are displayed)  Labs Reviewed  BASIC METABOLIC PANEL - Abnormal; Notable for the following components:      Result Value   Glucose, Bld 202 (*)    Calcium 8.4 (*)    All other components within normal limits  BRAIN NATRIURETIC PEPTIDE - Abnormal; Notable for the following components:   B Natriuretic Peptide 1,650.5 (*)    All other components within normal limits  TROPONIN I  (HIGH SENSITIVITY) - Abnormal; Notable for the following components:   Troponin I (High Sensitivity) 44 (*)    All other components within normal limits  TROPONIN I (HIGH SENSITIVITY) - Abnormal; Notable for the following components:   Troponin I (High Sensitivity) 275 (*)    All other components within normal limits  SARS CORONAVIRUS 2 BY RT PCR (HOSPITAL ORDER, [place] [facility])  CBC   ____________________________________________  EKG  ED ECG REPORT I, Blake Divine, the attending physician, personally viewed and interpreted this ECG.   Date: 03/16/2020  EKG Time: 9:55  Rate: 94  Rhythm: normal sinus rhythm  Axis: Normal  Intervals:none  ST&T Change: Inferior Q waves   PROCEDURES  Procedure(s) performed (  including Critical Care):  Procedures   ____________________________________________   INITIAL IMPRESSION / ASSESSMENT AND PLAN / ED COURSE       79 year old male with history of hypertension, hyperlipidemia, and diabetes presents to the ED with increasing shortness of breath over the past 1 to 2 weeks with associated swelling in both of his legs.  He appears clinically fluid overloaded, chest x-ray and BNP are consistent with new onset CHF.  EKG shows no acute ischemic changes and initial troponin was only mildly elevated, however repeat with significant increase to greater than 200.  We will initiate diuresis with IV Lasix and case was discussed with Dr. Gwen Pounds of cardiology, who agrees that ACS is less likely than demand ischemia secondary to CHF.  We will hold off on heparin for now but will load with aspirin.  He will require admission due to new onset CHF with elevated troponin.  Case discussed with hospitalist, who accepts patient for admission.      ____________________________________________   FINAL CLINICAL IMPRESSION(S) / ED DIAGNOSES  Final diagnoses:  Acute congestive heart failure, unspecified heart failure type (HCC)  SOB  (shortness of breath)  Elevated troponin     ED Discharge Orders    None       Note:  This document was prepared using Dragon voice recognition software and may include unintentional dictation errors.   Chesley Noon, MD 03/16/20 [phone]

## 2020-03-16 NOTE — ED Notes (Signed)
Called for vitals with no answer 

## 2020-03-17 ENCOUNTER — Inpatient Hospital Stay
Admit: 2020-03-17 | Discharge: 2020-03-17 | Disposition: A | Discharge status: Home / Self Care | Payer: PPO | Attending: Hospitalist | Admitting: Hospitalist

## 2020-03-17 ENCOUNTER — Encounter: Payer: Self-pay | Admitting: Hospitalist

## 2020-03-17 ENCOUNTER — Encounter
Admission: EM | Disposition: A | Discharge status: Home / Self Care | Payer: Self-pay | Source: Home / Self Care | Attending: Internal Medicine

## 2020-03-17 DIAGNOSIS — I5023 Acute on chronic systolic (congestive) heart failure: Secondary | ICD-10-CM

## 2020-03-17 HISTORY — PX: RIGHT/LEFT HEART CATH AND CORONARY ANGIOGRAPHY: CATH118266

## 2020-03-17 LAB — GLUCOSE, CAPILLARY
Glucose-Capillary: 118 mg/dL — ABNORMAL HIGH (ref 70–99)
Glucose-Capillary: 118 mg/dL — ABNORMAL HIGH (ref 70–99)
Glucose-Capillary: 130 mg/dL — ABNORMAL HIGH (ref 70–99)
Glucose-Capillary: 151 mg/dL — ABNORMAL HIGH (ref 70–99)

## 2020-03-17 LAB — ECHOCARDIOGRAM COMPLETE
Height: 68 in
Weight: 3808 oz

## 2020-03-17 LAB — BASIC METABOLIC PANEL
Anion gap: 10 (ref 5–15)
BUN: 15 mg/dL (ref 8–23)
CO2: 27 mmol/L (ref 22–32)
Calcium: 8.4 mg/dL — ABNORMAL LOW (ref 8.9–10.3)
Chloride: 102 mmol/L (ref 98–111)
Creatinine, Ser: 0.86 mg/dL (ref 0.61–1.24)
GFR calc Af Amer: >60 mL/min (ref >60)
GFR calc non Af Amer: >60 mL/min (ref >60)
Glucose, Bld: 134 mg/dL — ABNORMAL HIGH (ref 70–99)
Potassium: 3.6 mmol/L (ref 3.5–5.1)
Sodium: 139 mmol/L (ref 135–145)

## 2020-03-17 LAB — CBC
HCT: 39.6 % (ref 39.0–52.0)
Hemoglobin: 13.1 g/dL (ref 13.0–17.0)
MCH: 28.1 pg (ref 26.0–34.0)
MCHC: 33.1 g/dL (ref 30.0–36.0)
MCV: 84.8 fL (ref 80.0–100.0)
Platelets: 218 10*3/uL (ref 150–400)
RBC: 4.67 MIL/uL (ref 4.22–5.81)
RDW: 14.6 % (ref 11.5–15.5)
WBC: 8.8 10*3/uL (ref 4.0–10.5)
nRBC: 0 % (ref 0.0–0.2)

## 2020-03-17 LAB — HEPARIN LEVEL (UNFRACTIONATED)
Heparin Unfractionated: 0.25 IU/mL — ABNORMAL LOW (ref 0.30–0.70)
Heparin Unfractionated: 0.44 IU/mL (ref 0.30–0.70)

## 2020-03-17 LAB — MAGNESIUM: Magnesium: 1.8 mg/dL (ref 1.7–2.4)

## 2020-03-17 SURGERY — RIGHT/LEFT HEART CATH AND CORONARY ANGIOGRAPHY
Anesthesia: Moderate Sedation

## 2020-03-17 MED ORDER — HEPARIN BOLUS VIA INFUSION
1200.0000 [IU] | Freq: Once | INTRAVENOUS | Status: AC
  Administered 2020-03-17: 1200 [IU] via INTRAVENOUS
  Filled 2020-03-17: qty 1200

## 2020-03-17 MED ORDER — ACETAMINOPHEN 325 MG PO TABS: 650.0000 mg | ORAL_TABLET | ORAL | Status: DC | PRN

## 2020-03-17 MED ORDER — HEPARIN (PORCINE) IN NACL 1000-0.9 UT/500ML-% IV SOLN
INTRAVENOUS | Status: AC
  Filled 2020-03-17: qty 1000

## 2020-03-17 MED ORDER — SODIUM CHLORIDE 0.9 % WEIGHT BASED INFUSION: 3.0000 mL/kg/h | INTRAVENOUS | Status: DC

## 2020-03-17 MED ORDER — ATORVASTATIN CALCIUM 20 MG PO TABS
80.0000 mg | ORAL_TABLET | Freq: Every day | ORAL | Status: DC
  Administered 2020-03-17 – 2020-03-18 (×2): 80 mg via ORAL
  Filled 2020-03-17 (×2): qty 4

## 2020-03-17 MED ORDER — HEPARIN (PORCINE) IN NACL 1000-0.9 UT/500ML-% IV SOLN
INTRAVENOUS | Status: DC | PRN
  Administered 2020-03-17: 500 mL

## 2020-03-17 MED ORDER — SODIUM CHLORIDE 0.9 % WEIGHT BASED INFUSION
1.0000 mL/kg/h | INTRAVENOUS | Status: DC
  Administered 2020-03-17: 1 mL/kg/h via INTRAVENOUS

## 2020-03-17 MED ORDER — SODIUM CHLORIDE 0.9% FLUSH: 3.0000 mL | INTRAVENOUS | Status: DC | PRN

## 2020-03-17 MED ORDER — FUROSEMIDE 10 MG/ML IJ SOLN
40.0000 mg | Freq: Two times a day (BID) | INTRAMUSCULAR | Status: DC
  Administered 2020-03-17 – 2020-03-18 (×2): 40 mg via INTRAVENOUS
  Filled 2020-03-17 (×3): qty 4

## 2020-03-17 MED ORDER — METOPROLOL TARTRATE 25 MG PO TABS
12.5000 mg | ORAL_TABLET | Freq: Two times a day (BID) | ORAL | Status: DC
  Administered 2020-03-17 – 2020-03-18 (×2): 12.5 mg via ORAL
  Filled 2020-03-17 (×2): qty 1

## 2020-03-17 MED ORDER — MIDAZOLAM HCL 2 MG/2ML IJ SOLN
INTRAMUSCULAR | Status: AC
  Filled 2020-03-17: qty 2

## 2020-03-17 MED ORDER — PRAVASTATIN SODIUM 20 MG PO TABS
20.0000 mg | ORAL_TABLET | Freq: Every day | ORAL | Status: DC
  Filled 2020-03-17: qty 1

## 2020-03-17 MED ORDER — SODIUM CHLORIDE 0.9 % WEIGHT BASED INFUSION: 1.0000 mL/kg/h | INTRAVENOUS | Status: DC

## 2020-03-17 MED ORDER — ASPIRIN 81 MG PO CHEW
81.0000 mg | CHEWABLE_TABLET | ORAL | Status: DC
  Administered 2020-03-17: 81 mg via ORAL

## 2020-03-17 MED ORDER — ASPIRIN EC 81 MG PO TBEC
DELAYED_RELEASE_TABLET | ORAL | Status: AC
  Filled 2020-03-17: qty 1

## 2020-03-17 MED ORDER — SODIUM CHLORIDE 0.9 % IV SOLN: 250.0000 mL | INTRAVENOUS | Status: DC | PRN

## 2020-03-17 MED ORDER — ONDANSETRON HCL 4 MG/2ML IJ SOLN: 4.0000 mg | Freq: Four times a day (QID) | INTRAMUSCULAR | Status: DC | PRN

## 2020-03-17 MED ORDER — ASPIRIN EC 81 MG PO TBEC
81.0000 mg | DELAYED_RELEASE_TABLET | Freq: Every day | ORAL | Status: DC
  Administered 2020-03-17 – 2020-03-18 (×2): 81 mg via ORAL
  Filled 2020-03-17 (×2): qty 1

## 2020-03-17 MED ORDER — FENTANYL CITRATE (PF) 100 MCG/2ML IJ SOLN
INTRAMUSCULAR | Status: AC
  Filled 2020-03-17: qty 2

## 2020-03-17 MED ORDER — FENTANYL CITRATE (PF) 100 MCG/2ML IJ SOLN
INTRAMUSCULAR | Status: DC | PRN
  Administered 2020-03-17 (×2): 25 ug via INTRAVENOUS

## 2020-03-17 MED ORDER — SODIUM CHLORIDE 0.9% FLUSH
3.0000 mL | Freq: Two times a day (BID) | INTRAVENOUS | Status: DC
  Administered 2020-03-17 – 2020-03-18 (×2): 3 mL via INTRAVENOUS

## 2020-03-17 MED ORDER — HYDRALAZINE HCL 20 MG/ML IJ SOLN: 10.0000 mg | INTRAMUSCULAR | Status: AC | PRN

## 2020-03-17 MED ORDER — MIDAZOLAM HCL 2 MG/2ML IJ SOLN
INTRAMUSCULAR | Status: DC | PRN
  Administered 2020-03-17 (×2): 1 mg via INTRAVENOUS

## 2020-03-17 MED ORDER — IOHEXOL 300 MG/ML  SOLN
INTRAMUSCULAR | Status: DC | PRN
  Administered 2020-03-17: 150 mL

## 2020-03-17 MED ORDER — LABETALOL HCL 5 MG/ML IV SOLN: 10.0000 mg | INTRAVENOUS | Status: AC | PRN

## 2020-03-17 SURGICAL SUPPLY — 15 items
CATH INFINITI 5FR ANG PIGTAIL (CATHETERS) ×2 IMPLANT
CATH INFINITI 5FR JL4 (CATHETERS) ×2 IMPLANT
CATH INFINITI 5FR JL5 (CATHETERS) ×2 IMPLANT
CATH INFINITI JR4 5F (CATHETERS) ×2 IMPLANT
CATH SWANZ 7F THERMO (CATHETERS) ×2 IMPLANT
DEVICE CLOSURE MYNXGRIP 5F (Vascular Products) ×2 IMPLANT
GUIDEWIRE EMER 3M J .025X150CM (WIRE) ×2 IMPLANT
KIT MANI 3VAL PERCEP (MISCELLANEOUS) ×3 IMPLANT
KIT RIGHT HEART (MISCELLANEOUS) ×2 IMPLANT
NDL PERC 18GX7CM (NEEDLE) IMPLANT
NEEDLE PERC 18GX7CM (NEEDLE) ×3 IMPLANT
PACK CARDIAC CATH (CUSTOM PROCEDURE TRAY) ×2 IMPLANT
SHEATH AVANTI 5FR X 11CM (SHEATH) ×2 IMPLANT
SHEATH AVANTI 7FRX11 (SHEATH) ×2 IMPLANT
WIRE GUIDERIGHT .035X150 (WIRE) ×2 IMPLANT

## 2020-03-17 NOTE — Progress Notes (Signed)
PROGRESS NOTE    Aaron Sosa   PYP:[id number]  DOB: February 26, 1941  PCP: Olin Hauser, DO    DOA: 03/16/2020 LOS: 1   Brief Narrative   Aaron Sosa is a 79 y.o. male with medical history significant of DM2, HTN, former smoker who presented to the ED on 03/16/20 with worsening dyspnea with associated cough and lower extremity swelling.   He denied chest pain or history of CHF at time of admission.  In the ED, afebrile, HR 101, otherwise normal vitals.  Labs notable for BNP 1650, no leukocytosis, initial troponin 44 >> 275.  ECG negative for acute ischemic changes.  Chest xray showed mild bibasilar subsegmental atelectasis or edema.  Patient treated with IV Lasix and full dose ASA.  Admitted to hospitalist service with cardiology consulted.     Assessment & Plan   Active Problems:   Acute exacerbation of CHF (congestive heart failure) (HCC)  NSTEMI - present on admission with chest pain, elevated troponin, no ST elevation on ECG. Taken to cath lab today, severe three-vessel disease noted. --Cardiology following, Dr. Nehemiah Massed --continue ASA, statin, beta blocker (with hold parameters) --Cardiology to consult with CTS regarding possible referral for CABG vs limited PCI and medical management --Cardiac rehab on d/c  Acute systolic CHF / volume overload - present on admission, BNP 1250.  Improved with IV Lasix.  EF 25% on cath 03/17/20.  Type 2 Diabetes - last A1c 7.7%.  Uncontrolled.  Hold metformin.  Sliding scale Novolog.  Essential hypertension - chronic, currently borderline low BP's.  Does not appear to be on antihypertensives outpatient.  Hold parameters on beta blocker.  Monitor BP.   Patient BMI: Body mass index is 36.19 kg/m.   DVT prophylaxis: heparin gtt  Diet:  Diet Orders (From admission, onward)    Start     Ordered   03/17/20 0001  Diet NPO time specified Except for: Sips with Meds  Diet effective midnight    Question:  Except for  Answer:  [name] with  Meds   03/16/20 1750            Code Status: Full Code    Subjective 03/17/20    Patient seen before cardiac cath this AM.  Denies chest pain.  Still has some mild shortness of breath.  No other acute complaints or events reported.   Disposition Plan & Communication   Status is: Inpatient  Remains inpatient appropriate because:Inpatient level of care appropriate due to severity of illness as patient with 3 vessel coronary disease and presented with NSTEMI.  Further consultation between cardiology and cardiothoracic surgery needed to determine plan.   Dispo: The patient is from: Home              Anticipated d/c is to: Home              Anticipated d/c date is: 2 days              Patient currently is not medically stable to d/c.   Family Communication: none at bedside, will attempt to call.    Consults, Procedures, Significant Events   Consultants:   Cardiology  Procedures:   Cardiac cath 5/19  Antimicrobials:   none     Objective   Vitals:   03/16/20 1942 03/16/20 2022 03/17/20 0113 03/17/20 0558  BP: 129/90 (!) 139/91 121/83 123/88  Pulse: 87 87 75 74  Resp: 18 20 20 20   Temp:  97.7 F (36.5 C) 98.2 F (  36.8 C) 97.8 F (36.6 C)  TempSrc:  Oral Oral   SpO2: 97% 96% 94% 95%  Weight:      Height:        Intake/Output Summary (Last 24 hours) at 03/17/2020 0729 Last data filed at 03/17/2020 3646 Gross per 24 hour  Intake 181.85 ml  Output 5650 ml  Net -5468.15 ml   Filed Weights   03/16/20 0945  Weight: 108 kg    Physical Exam:  General exam: awake, alert, no acute distress HEENT: moist mucus membranes, hearing grossly normal  Respiratory system: CTAB, no wheezes, rales or rhonchi, normal respiratory effort. Cardiovascular system: normal S1/S2, RRR, no JVD, murmurs, rubs, gallops, no pedal edema.   Central nervous system: A&O x4. no gross focal neurologic deficits, normal speech Extremities: moves all, no cyanosis, normal tone Skin: dry,  intact, normal temperature, normal color Psychiatry: normal mood, congruent affect, judgement and insight appear normal  Labs   Data Reviewed: I have personally reviewed following labs and imaging studies  CBC: Recent Labs  Lab 03/16/20 0950 03/17/20 0211  WBC 9.5 8.8  HGB 13.1 13.1  HCT 40.9 39.6  MCV 88.1 84.8  PLT 238 218   Basic Metabolic Panel: Recent Labs  Lab 03/16/20 0950 03/17/20 0211  NA 138 139  K 4.0 3.6  CL 104 102  CO2 25 27  GLUCOSE 202* 134*  BUN 16 15  CREATININE 1.00 0.86  CALCIUM 8.4* 8.4*  MG  --  1.8   GFR: Estimated Creatinine Clearance: 84.3 mL/min (by C-G formula based on SCr of 0.86 mg/dL). Liver Function Tests: No results for input(s): AST, ALT, ALKPHOS, BILITOT, PROT, ALBUMIN in the last 168 hours. No results for input(s): LIPASE, AMYLASE in the last 168 hours. No results for input(s): AMMONIA in the last 168 hours. Coagulation Profile: Recent Labs  Lab 03/16/20 1747  INR 1.1   Cardiac Enzymes: No results for input(s): CKTOTAL, CKMB, CKMBINDEX, TROPONINI in the last 168 hours. BNP (last 3 results) No results for input(s): PROBNP in the last 8760 hours. HbA1C: No results for input(s): HGBA1C in the last 72 hours. CBG: Recent Labs  Lab 03/16/20 2306  GLUCAP 135*   Lipid Profile: Recent Labs    03/16/20 1242  CHOL 171  HDL 43  LDLCALC 103*  TRIG 123  CHOLHDL 4.0   Thyroid Function Tests: Recent Labs    03/16/20 1242  TSH 1.401   Anemia Panel: No results for input(s): VITAMINB12, FOLATE, FERRITIN, TIBC, IRON, RETICCTPCT in the last 72 hours. Sepsis Labs: No results for input(s): PROCALCITON, LATICACIDVEN in the last 168 hours.  Recent Results (from the past 240 hour(s))  SARS Coronavirus 2 by RT PCR (hospital order, performed in [facility] hospital lab) Nasopharyngeal Nasopharyngeal Swab     Status: None   Collection Time: 03/16/20  2:23 PM   Specimen: Nasopharyngeal Swab  Result Value Ref Range Status   SARS  Coronavirus 2 NEGATIVE NEGATIVE Final    Comment: (NOTE) SARS-CoV-2 target nucleic acids are NOT DETECTED. The SARS-CoV-2 RNA is generally detectable in upper and lower respiratory specimens during the acute phase of infection. The lowest concentration of SARS-CoV-2 viral copies this assay can detect is 250 copies / mL. A negative result does not preclude SARS-CoV-2 infection and should not be used as the sole basis for treatment or other patient management decisions.  A negative result may occur with improper specimen collection / handling, submission of specimen other than nasopharyngeal swab, presence of viral mutation(s)  within the areas targeted by this assay, and inadequate number of viral copies (<250 copies / mL). A negative result must be combined with clinical observations, patient history, and epidemiological information. Fact Sheet for Patients:   BoilerBrush.com.cy Fact Sheet for Healthcare Providers: https://pope.com/ This test is not yet approved or cleared  by the [place] FDA and has been authorized for detection and/or diagnosis of SARS-CoV-2 by FDA under an Emergency Use Authorization (EUA).  This EUA will remain in effect (meaning this test can be used) for the duration of the COVID-19 declaration under Section 564(b)(1) of the Act, 21 U.S.C. section 360bbb-3(b)(1), unless the authorization is terminated or revoked sooner. Performed at [facility], [address], [place], [place] [address]       Imaging Studies   DG Chest 2 View  Result Date: 03/16/2020 CLINICAL DATA:  Shortness of breath, chest pain. EXAM: CHEST - 2 VIEW COMPARISON:  None. FINDINGS: The heart size and mediastinal contours are within normal limits. No pneumothorax or pleural effusion is noted. Mild bibasilar subsegmental atelectasis or edema is noted. The visualized skeletal structures are unremarkable. IMPRESSION: Mild bibasilar  subsegmental atelectasis or edema. Electronically Signed   By: Lupita Raider M.D.   On: 03/16/2020 10:29     Medications   Scheduled Meds: . insulin aspart  0-15 Units Subcutaneous TID WC  . pneumococcal 23 valent vaccine  0.5 mL Intramuscular Tomorrow-1000  . sodium chloride flush  3 mL Intravenous Once   Continuous Infusions: . heparin 1,350 Units/hr (03/17/20 0500)       LOS: 1 day    Time spent: 30 minutes    Pennie Banter, DO [facility]  03/17/2020, 7:29 AM    If 7PM-7AM, please contact night-coverage. How to contact the [facility] Attending or Consulting Earland Reish 7A - 7P or covering Darrill Vreeland during after hours 7P -7A, for this patient?    1. Check the care team in [name] and look for a) attending/consulting [facility] Alajia Schmelzer listed and b) the [name] team listed 2. Log into [email or web address] and use [place]'s universal password to access. If you do not have the password, please contact the hospital operator. 3. Locate the [facility] Tamura you are looking for under [facility] and page to a number that you can be directly reached. 4. If you still have difficulty reaching the Apostolos Blagg, please page the [facility] (Director on Call) for the Hospitalists listed on amion for assistance.

## 2020-03-17 NOTE — Progress Notes (Signed)
ANTICOAGULATION CONSULT NOTE  Pharmacy Consult for heparin Indication: chest pain/ACS  Allergies  Allergen Reactions  . Atorvastatin Other (See Comments)    myalgia  . Codeine   . Shrimp [Shellfish Allergy]     Patient Measurements: Height: 5\' 8"  (172.7 cm) Weight: 108 kg (238 lb) IBW/kg (Calculated) : 68.4 Heparin Dosing Weight: 92 kg  Vital Signs: Temp: 98.2 F (36.8 C) (05/19 0113) Temp Source: Oral (05/19 0113) BP: 121/83 (05/19 0113) Pulse Rate: 75 (05/19 0113)  Labs: Recent Labs    03/16/20 0950 03/16/20 1242 03/16/20 1448 03/16/20 1747 03/16/20 2017 03/16/20 2258 03/17/20 0211  HGB 13.1  --   --   --   --   --  13.1  HCT 40.9  --   --   --   --   --  39.6  PLT 238  --   --   --   --   --  218  APTT  --   --   --  28  --   --   --   LABPROT  --   --   --  13.9  --   --   --   INR  --   --   --  1.1  --   --   --   HEPARINUNFRC  --   --   --   --   --   --  0.25*  CREATININE 1.00  --   --   --   --   --  0.86  TROPONINIHS 44*   < > 792*  --  2,091* 2,223*  --    < > = values in this interval not displayed.    Estimated Creatinine Clearance: 84.3 mL/min (by C-G formula based on SCr of 0.86 mg/dL).   Medical History: Past Medical History:  Diagnosis Date  . Arthritis   . Diabetes mellitus without complication (HCC)   . HOH (hard of hearing)   . Lower extremity edema     Assessment: 79 year old male admitted with SOB and chest pain, swelling in lower extremities. Cardiology consult. Pharmacy consulted to start heparin. Patient is not on anticoagulation PTA.  Goal of Therapy:  Heparin level 0.3-0.7 units/ml Monitor platelets by anticoagulation protocol: Yes   Plan:  05/19 @ 0200 HL 0.25 subtherapeutic. Will rebolus heparin 1200 units IV x 1 and increase rate to 1350 units/hr and will recheck HL at 1100, CBC stable will continue to monitor.  6/19, PharmD 03/17/2020,3:32 AM

## 2020-03-17 NOTE — Progress Notes (Signed)
[facility] Encounter Note  Patient: Aaron Sosa / Admit Date: 03/16/2020 / Date of Encounter: 03/17/2020, 1:46 PM   Subjective: Patient does have some improvements in shortness of breath lower extremity edema and pulmonary edema.  Patient did have chest discomfort in the last several weeks consistent with anginal equivalent.  Elevation of troponin to 2332 consistent with non-ST elevation myocardial infarction. Cardiac catheterization showing severe LV systolic dysfunction with ejection fraction of 20% with severe dilation and moderate mitral regurgitation Severe three-vessel coronary artery disease with 90% ostial right and diffuse PDA and PL stenosis Occluded proximal LAD with some collaterals to mid LAD Occluded circumflex with minimal collaterals to posterior wall  Review of Systems: Positive for: Shortness of breath Negative for: Vision change, hearing change, syncope, dizziness, nausea, vomiting,diarrhea, bloody stool, stomach pain, cough, congestion, diaphoresis, urinary frequency, urinary pain,skin lesions, skin rashes Others previously listed  Objective: Telemetry: Normal sinus rhythm Physical Exam: Blood pressure 124/87, pulse 87, temperature 98.1 F (36.7 C), temperature source Oral, resp. rate (!) 23, height 5\' 8"  (1.727 m), weight 108 kg, SpO2 95 %. Body mass index is 36.19 kg/m. General: Well developed, well nourished, in no acute distress. Head: Normocephalic, atraumatic, sclera non-icteric, no xanthomas, nares are without discharge. Neck: No apparent masses Lungs: Normal respirations with no wheezes, no rhonchi, basilar rales , no crackles   Heart: Regular rate and rhythm, normal S1 S2, no murmur, no rub, no gallop, PMI is normal size and placement, carotid upstroke normal without bruit, jugular venous pressure normal Abdomen: Soft, non-tender, non-distended with normoactive bowel sounds. No hepatosplenomegaly. Abdominal aorta is normal size without  bruit Extremities: 1-2+ edema, no clubbing, no cyanosis, no ulcers,  Peripheral: 2+ radial, 2+ femoral, 2+ dorsal pedal pulses Neuro: Alert and oriented. Moves all extremities spontaneously. Psych:  Responds to questions appropriately with a normal affect.   Intake/Output Summary (Last 24 hours) at 03/17/2020 1346 Last data filed at 03/17/2020 0913 Gross per 24 hour  Intake 181.85 ml  Output 5850 ml  Net -5668.15 ml    Inpatient Medications:  . aspirin  81 mg Oral Pre-Cath  . aspirin EC      . [MAR Hold] insulin aspart  0-15 Units Subcutaneous TID WC  . pneumococcal 23 valent vaccine  0.5 mL Intramuscular Tomorrow-1000  . [MAR Hold] sodium chloride flush  3 mL Intravenous Once  . [MAR Hold] sodium chloride flush  3 mL Intravenous Q12H   Infusions:  . sodium chloride    . [START ON 03/18/2020] sodium chloride     Followed by  . [START ON 03/18/2020] sodium chloride 1 mL/kg/hr (03/17/20 1159)  . heparin Stopped (03/17/20 1157)    Labs: Recent Labs    03/16/20 0950 03/17/20 0211  NA 138 139  K 4.0 3.6  CL 104 102  CO2 25 27  GLUCOSE 202* 134*  BUN 16 15  CREATININE 1.00 0.86  CALCIUM 8.4* 8.4*  MG  --  1.8   No results for input(s): AST, ALT, ALKPHOS, BILITOT, PROT, ALBUMIN in the last 72 hours. Recent Labs    03/16/20 0950 03/17/20 0211  WBC 9.5 8.8  HGB 13.1 13.1  HCT 40.9 39.6  MCV 88.1 84.8  PLT 238 218   No results for input(s): CKTOTAL, CKMB, TROPONINI in the last 72 hours. Invalid input(s): POCBNP No results for input(s): HGBA1C in the last 72 hours.   Weights: Filed Weights   03/16/20 0945  Weight: 108 kg     Radiology/Studies:  DG Chest 2 View  Result Date: 03/16/2020 CLINICAL DATA:  Shortness of breath, chest pain. EXAM: CHEST - 2 VIEW COMPARISON:  None. FINDINGS: The heart size and mediastinal contours are within normal limits. No pneumothorax or pleural effusion is noted. Mild bibasilar subsegmental atelectasis or edema is noted. The  visualized skeletal structures are unremarkable. IMPRESSION: Mild bibasilar subsegmental atelectasis or edema. Electronically Signed   By: Marijo Conception M.D.   On: 03/16/2020 10:29     Assessment and Recommendation  79 y.o. male 79 year old male with acute systolic dysfunction congestive heart failure and non-ST elevation myocardial infarction with cardiac catheterization showing severe LV systolic dysfunction with dilation of the left ventricle with moderate mitral vegetation and severe three-vessel coronary artery disease 1.  Continue aspirin for further risk reduction of myocardial infarction 2.  Begin beta-blocker ACE inhibitor as able for cardiomyopathy and congestive heart failure 3.  Nitrates for chest discomfort 4.  Lasix for continued treatment of lower extremity edema pulmonary edema 5.  Discussion with the family for the possibility of coronary to bypass surgery and discussions of other interventions as able.  Signed, Serafina Royals M.D. FACC

## 2020-03-17 NOTE — Hospital Course (Signed)
Aaron Sosa is a 79 y.o. male with medical history significant of DM2, HTN, former smoker who presented to the ED on 03/16/20 with worsening dyspnea with associated cough and lower extremity swelling.   He denied chest pain or history of CHF at time of admission.  In the ED, afebrile, HR 101, otherwise normal vitals.  Labs notable for BNP 1650, no leukocytosis, initial troponin 44 >> 275.  ECG negative for acute ischemic changes.  Chest xray showed mild bibasilar subsegmental atelectasis or edema.  Patient treated with IV Lasix and full dose ASA.  Admitted to hospitalist service with cardiology consulted.

## 2020-03-17 NOTE — Progress Notes (Signed)
[facility] Cardiology [facility] Encounter Note  Patient: Aaron Sosa / Admit Date: 03/16/2020 / Date of Encounter: 03/17/2020, 8:44 AM   Subjective: Patient significantly improved from yesterday with less shortness of breath.  Continued lower extremity edema.  No evidence of chest pain overnight.  Troponin elevation consistent with non-ST elevation myocardial infarction.  Echocardiogram pending  Review of Systems: Positive for: Shortness of breath Negative for: Vision change, hearing change, syncope, dizziness, nausea, vomiting,diarrhea, bloody stool, stomach pain, cough, congestion, diaphoresis, urinary frequency, urinary pain,skin lesions, skin rashes Others previously listed  Objective: Telemetry: Normal sinus rhythm Physical Exam: Blood pressure 123/88, pulse 74, temperature 97.8 F (36.6 C), resp. rate 20, height 5\' 8"  (1.727 m), weight 108 kg, SpO2 95 %. Body mass index is 36.19 kg/m. General: Well developed, well nourished, in no acute distress. Head: Normocephalic, atraumatic, sclera non-icteric, no xanthomas, nares are without discharge. Neck: No apparent masses Lungs: Normal respirations with no wheezes, no rhonchi, no rales , solar crackles   Heart: Regular rate and rhythm, normal S1 S2, no murmur, no rub, no gallop, PMI is normal size and placement, carotid upstroke normal without bruit, jugular venous pressure normal Abdomen: Soft, non-tender, non-distended with normoactive bowel sounds. No hepatosplenomegaly. Abdominal aorta is normal size without bruit Extremities: 1-2+ edema, no clubbing, no cyanosis, no ulcers,  Peripheral: 2+ radial, 2+ femoral, 2+ dorsal pedal pulses Neuro: Alert and oriented. Moves all extremities spontaneously. Psych:  Responds to questions appropriately with a normal affect.   Intake/Output Summary (Last 24 hours) at 03/17/2020 0844 Last data filed at 03/17/2020 03/19/2020 Gross per 24 hour  Intake 181.85 ml  Output 5650 ml  Net -5468.15 ml     Inpatient Medications:  . insulin aspart  0-15 Units Subcutaneous TID WC  . pneumococcal 23 valent vaccine  0.5 mL Intramuscular Tomorrow-1000  . sodium chloride flush  3 mL Intravenous Once  . sodium chloride flush  3 mL Intravenous Q12H   Infusions:  . heparin 1,350 Units/hr (03/17/20 0500)    Labs: Recent Labs    03/16/20 0950 03/17/20 0211  NA 138 139  K 4.0 3.6  CL 104 102  CO2 25 27  GLUCOSE 202* 134*  BUN 16 15  CREATININE 1.00 0.86  CALCIUM 8.4* 8.4*  MG  --  1.8   No results for input(s): AST, ALT, ALKPHOS, BILITOT, PROT, ALBUMIN in the last 72 hours. Recent Labs    03/16/20 0950 03/17/20 0211  WBC 9.5 8.8  HGB 13.1 13.1  HCT 40.9 39.6  MCV 88.1 84.8  PLT 238 218   No results for input(s): CKTOTAL, CKMB, TROPONINI in the last 72 hours. Invalid input(s): POCBNP No results for input(s): HGBA1C in the last 72 hours.   Weights: Filed Weights   03/16/20 0945  Weight: 108 kg     Radiology/Studies:  DG Chest 2 View  Result Date: 03/16/2020 CLINICAL DATA:  Shortness of breath, chest pain. EXAM: CHEST - 2 VIEW COMPARISON:  None. FINDINGS: The heart size and mediastinal contours are within normal limits. No pneumothorax or pleural effusion is noted. Mild bibasilar subsegmental atelectasis or edema is noted. The visualized skeletal structures are unremarkable. IMPRESSION: Mild bibasilar subsegmental atelectasis or edema. Electronically Signed   By: 03/18/2020 M.D.   On: 03/16/2020 10:29     Assessment and Recommendation  79 y.o. male with hypertension hyperlipidemia with acute systolic dysfunction congestive heart failure with pulmonary edema lower extremity edema and a non-ST elevation myocardial infarction improved on  appropriate medication management 1.  Continue heparin for further risk reduction along with aspirin for further risk reduction of non-ST elevation myocardial infarction 2.  Continue intravenous Lasix for pulmonary edema lower extremity  edema and acute systolic dysfunction congestive heart failure 3.  Proceed to cardiac catheterization to assess coronary anatomy and further treatment thereof as necessary.  Patient understands the risk and benefits of cardiac catheterization.  This includes the possibility of death stroke heart attack infection bleeding or blood clot.  He is at low risk for conscious sedation 4.  Further treatment options after above  Signed, Serafina Royals M.D. FACC

## 2020-03-17 NOTE — Progress Notes (Signed)
*  PRELIMINARY RESULTS* Echocardiogram 2D Echocardiogram has been performed.  Joanette Gula Dnya Hickle 03/17/2020, 12:07 PM

## 2020-03-18 ENCOUNTER — Ambulatory Visit: Payer: Self-pay | Admitting: Licensed Clinical Social Worker

## 2020-03-18 DIAGNOSIS — Z01818 Encounter for other preprocedural examination: Secondary | ICD-10-CM | POA: Diagnosis not present

## 2020-03-18 DIAGNOSIS — Z87891 Personal history of nicotine dependence: Secondary | ICD-10-CM | POA: Diagnosis not present

## 2020-03-18 DIAGNOSIS — I5021 Acute systolic (congestive) heart failure: Secondary | ICD-10-CM | POA: Diagnosis not present

## 2020-03-18 DIAGNOSIS — I2510 Atherosclerotic heart disease of native coronary artery without angina pectoris: Secondary | ICD-10-CM | POA: Diagnosis not present

## 2020-03-18 DIAGNOSIS — I255 Ischemic cardiomyopathy: Secondary | ICD-10-CM | POA: Diagnosis not present

## 2020-03-18 DIAGNOSIS — I5023 Acute on chronic systolic (congestive) heart failure: Secondary | ICD-10-CM | POA: Diagnosis not present

## 2020-03-18 DIAGNOSIS — Z6832 Body mass index (BMI) 32.0-32.9, adult: Secondary | ICD-10-CM | POA: Diagnosis not present

## 2020-03-18 DIAGNOSIS — J189 Pneumonia, unspecified organism: Secondary | ICD-10-CM | POA: Diagnosis not present

## 2020-03-18 DIAGNOSIS — I5082 Biventricular heart failure: Secondary | ICD-10-CM | POA: Diagnosis not present

## 2020-03-18 DIAGNOSIS — E669 Obesity, unspecified: Secondary | ICD-10-CM | POA: Diagnosis not present

## 2020-03-18 DIAGNOSIS — I214 Non-ST elevation (NSTEMI) myocardial infarction: Secondary | ICD-10-CM | POA: Diagnosis not present

## 2020-03-18 DIAGNOSIS — I110 Hypertensive heart disease with heart failure: Secondary | ICD-10-CM | POA: Diagnosis not present

## 2020-03-18 DIAGNOSIS — Z20822 Contact with and (suspected) exposure to covid-19: Secondary | ICD-10-CM | POA: Diagnosis not present

## 2020-03-18 DIAGNOSIS — Z8679 Personal history of other diseases of the circulatory system: Secondary | ICD-10-CM | POA: Diagnosis not present

## 2020-03-18 DIAGNOSIS — Z7982 Long term (current) use of aspirin: Secondary | ICD-10-CM | POA: Diagnosis not present

## 2020-03-18 DIAGNOSIS — I5022 Chronic systolic (congestive) heart failure: Secondary | ICD-10-CM | POA: Diagnosis not present

## 2020-03-18 DIAGNOSIS — I340 Nonrheumatic mitral (valve) insufficiency: Secondary | ICD-10-CM | POA: Diagnosis not present

## 2020-03-18 DIAGNOSIS — J9811 Atelectasis: Secondary | ICD-10-CM | POA: Diagnosis not present

## 2020-03-18 DIAGNOSIS — E785 Hyperlipidemia, unspecified: Secondary | ICD-10-CM | POA: Diagnosis not present

## 2020-03-18 DIAGNOSIS — I428 Other cardiomyopathies: Secondary | ICD-10-CM | POA: Diagnosis not present

## 2020-03-18 DIAGNOSIS — Z888 Allergy status to other drugs, medicaments and biological substances status: Secondary | ICD-10-CM | POA: Diagnosis not present

## 2020-03-18 DIAGNOSIS — E119 Type 2 diabetes mellitus without complications: Secondary | ICD-10-CM | POA: Diagnosis not present

## 2020-03-18 DIAGNOSIS — Z7984 Long term (current) use of oral hypoglycemic drugs: Secondary | ICD-10-CM | POA: Diagnosis not present

## 2020-03-18 DIAGNOSIS — K5900 Constipation, unspecified: Secondary | ICD-10-CM | POA: Diagnosis not present

## 2020-03-18 LAB — BASIC METABOLIC PANEL
Anion gap: 12 (ref 5–15)
BUN: 16 mg/dL (ref 8–23)
CO2: 28 mmol/L (ref 22–32)
Calcium: 8.6 mg/dL — ABNORMAL LOW (ref 8.9–10.3)
Chloride: 99 mmol/L (ref 98–111)
Creatinine, Ser: 1.08 mg/dL (ref 0.61–1.24)
GFR calc Af Amer: >60 mL/min (ref >60)
GFR calc non Af Amer: >60 mL/min (ref >60)
Glucose, Bld: 132 mg/dL — ABNORMAL HIGH (ref 70–99)
Potassium: 4.2 mmol/L (ref 3.5–5.1)
Sodium: 139 mmol/L (ref 135–145)

## 2020-03-18 LAB — CBC
HCT: 42.8 % (ref 39.0–52.0)
Hemoglobin: 13.7 g/dL (ref 13.0–17.0)
MCH: 28.2 pg (ref 26.0–34.0)
MCHC: 32 g/dL (ref 30.0–36.0)
MCV: 88.1 fL (ref 80.0–100.0)
Platelets: 227 10*3/uL (ref 150–400)
RBC: 4.86 MIL/uL (ref 4.22–5.81)
RDW: 14.5 % (ref 11.5–15.5)
WBC: 9.4 10*3/uL (ref 4.0–10.5)
nRBC: 0 % (ref 0.0–0.2)

## 2020-03-18 LAB — MAGNESIUM: Magnesium: 2 mg/dL (ref 1.7–2.4)

## 2020-03-18 LAB — GLUCOSE, CAPILLARY
Glucose-Capillary: 115 mg/dL — ABNORMAL HIGH (ref 70–99)
Glucose-Capillary: 168 mg/dL — ABNORMAL HIGH (ref 70–99)
Glucose-Capillary: 99 mg/dL (ref 70–99)

## 2020-03-18 MED ORDER — GUAIFENESIN-DM 100-10 MG/5ML PO SYRP: 10.0000 mL | ORAL_SOLUTION | Freq: Four times a day (QID) | ORAL | 0 refills | Status: DC | PRN

## 2020-03-18 MED ORDER — DOCUSATE SODIUM 100 MG PO CAPS: 100.0000 mg | ORAL_CAPSULE | Freq: Two times a day (BID) | ORAL | 0 refills | Status: DC | PRN

## 2020-03-18 MED ORDER — ONDANSETRON 4 MG PO TBDP: 4.0000 mg | ORAL_TABLET | Freq: Three times a day (TID) | ORAL | 0 refills | Status: DC | PRN

## 2020-03-18 MED ORDER — ASPIRIN 81 MG PO TBEC: 81.0000 mg | DELAYED_RELEASE_TABLET | Freq: Every day | ORAL | Status: DC

## 2020-03-18 MED ORDER — FUROSEMIDE 10 MG/ML IJ SOLN: 40.0000 mg | Freq: Two times a day (BID) | INTRAMUSCULAR | 0 refills | Status: DC

## 2020-03-18 MED ORDER — ALUM & MAG HYDROXIDE-SIMETH 200-200-20 MG/5ML PO SUSP: 15.0000 mL | Freq: Four times a day (QID) | ORAL | 0 refills | Status: DC | PRN

## 2020-03-18 MED ORDER — CALCIUM CARBONATE ANTACID 500 MG PO CHEW: 1.0000 | CHEWABLE_TABLET | Freq: Three times a day (TID) | ORAL | Status: DC | PRN

## 2020-03-18 MED ORDER — METOPROLOL TARTRATE 25 MG PO TABS: 12.5000 mg | ORAL_TABLET | Freq: Two times a day (BID) | ORAL | Status: DC

## 2020-03-18 MED ORDER — SODIUM CHLORIDE 0.9% FLUSH: 3.0000 mL | Freq: Once | INTRAVENOUS | 0 refills | Status: AC

## 2020-03-18 MED ORDER — ONDANSETRON HCL 4 MG/2ML IJ SOLN: 4.0000 mg | Freq: Four times a day (QID) | INTRAMUSCULAR | 0 refills | Status: DC | PRN

## 2020-03-18 MED ORDER — POLYETHYLENE GLYCOL 3350 17 G PO PACK: 17.0000 g | PACK | Freq: Two times a day (BID) | ORAL | 0 refills | Status: DC | PRN

## 2020-03-18 MED ORDER — ENOXAPARIN SODIUM 40 MG/0.4ML ~~LOC~~ SOLN: 40.0000 mg | SUBCUTANEOUS | Status: DC

## 2020-03-18 MED ORDER — INSULIN ASPART 100 UNIT/ML ~~LOC~~ SOLN: 0.0000 [IU] | Freq: Three times a day (TID) | SUBCUTANEOUS | 11 refills | Status: DC

## 2020-03-18 MED ORDER — ACETAMINOPHEN 500 MG PO TABS: 1000.0000 mg | ORAL_TABLET | Freq: Three times a day (TID) | ORAL | 0 refills | Status: DC | PRN

## 2020-03-18 MED ORDER — ATORVASTATIN CALCIUM 80 MG PO TABS: 80.0000 mg | ORAL_TABLET | Freq: Every day | ORAL | Status: DC

## 2020-03-18 MED ORDER — PNEUMOCOCCAL VAC POLYVALENT 25 MCG/0.5ML IJ INJ: 0.5000 mL | INJECTION | INTRAMUSCULAR | Status: AC

## 2020-03-18 MED ORDER — SODIUM CHLORIDE 0.9% FLUSH: 3.0000 mL | Freq: Two times a day (BID) | INTRAVENOUS | Status: DC

## 2020-03-18 NOTE — Progress Notes (Signed)
[facility] Cardiology [name] Encounter Note  Patient: Aaron Sosa / Admit Date: 03/16/2020 / Date of Encounter: 03/18/2020, 9:04 AM   Subjective: Patient does have some improvements in shortness of breath lower extremity edema and pulmonary edema.  Patient did have chest discomfort in the last several weeks consistent with anginal equivalent.  Elevation of troponin to 2332 consistent with non-ST elevation myocardial infarction. Cardiac catheterization showing severe LV systolic dysfunction with ejection fraction of 20% with severe dilation and moderate mitral regurgitation Severe three-vessel coronary artery disease with 90% ostial right and diffuse PDA and PL stenosis Occluded proximal LAD with some collaterals to mid LAD Occluded circumflex with minimal collaterals to posterior wall  Review of Systems: Positive for: Shortness of breath Negative for: Vision change, hearing change, syncope, dizziness, nausea, vomiting,diarrhea, bloody stool, stomach pain, cough, congestion, diaphoresis, urinary frequency, urinary pain,skin lesions, skin rashes Others previously listed  Objective: Telemetry: Normal sinus rhythm Physical Exam: Blood pressure 138/89, pulse 90, temperature 97.6 F (36.4 C), temperature source Oral, resp. rate 17, height 5\' 8"  (1.727 m), weight 108 kg, SpO2 100 %. Body mass index is 36.19 kg/m. General: Well developed, well nourished, in no acute distress. Head: Normocephalic, atraumatic, sclera non-icteric, no xanthomas, nares are without discharge. Neck: No apparent masses Lungs: Normal respirations with no wheezes, no rhonchi, basilar rales , no crackles   Heart: Regular rate and rhythm, normal S1 S2, no murmur, no rub, no gallop, PMI is normal size and placement, carotid upstroke normal without bruit, jugular venous pressure normal Abdomen: Soft, non-tender, non-distended with normoactive bowel sounds. No hepatosplenomegaly. Abdominal aorta is normal size without  bruit Extremities: 1-2+ edema, no clubbing, no cyanosis, no ulcers,  Peripheral: 2+ radial, 2+ femoral, 2+ dorsal pedal pulses Neuro: Alert and oriented. Moves all extremities spontaneously. Psych:  Responds to questions appropriately with a normal affect.   Intake/Output Summary (Last 24 hours) at 03/18/2020 0904 Last data filed at 03/17/2020 2156 Gross per 24 hour  Intake 90.14 ml  Output 880 ml  Net -789.86 ml    Inpatient Medications:  . aspirin EC  81 mg Oral Daily  . atorvastatin  80 mg Oral Daily  . furosemide  40 mg Intravenous BID  . insulin aspart  0-15 Units Subcutaneous TID WC  . metoprolol tartrate  12.5 mg Oral BID  . pneumococcal 23 valent vaccine  0.5 mL Intramuscular Tomorrow-1000  . sodium chloride flush  3 mL Intravenous Once  . sodium chloride flush  3 mL Intravenous Q12H   Infusions:    Labs: Recent Labs    03/17/20 0211 03/18/20 0515  NA 139 139  K 3.6 4.2  CL 102 99  CO2 27 28  GLUCOSE 134* 132*  BUN 15 16  CREATININE 0.86 1.08  CALCIUM 8.4* 8.6*  MG 1.8 2.0   No results for input(s): AST, ALT, ALKPHOS, BILITOT, PROT, ALBUMIN in the last 72 hours. Recent Labs    03/17/20 0211 03/18/20 0515  WBC 8.8 9.4  HGB 13.1 13.7  HCT 39.6 42.8  MCV 84.8 88.1  PLT 218 227   No results for input(s): CKTOTAL, CKMB, TROPONINI in the last 72 hours. Invalid input(s): POCBNP No results for input(s): HGBA1C in the last 72 hours.   Weights: Filed Weights   03/16/20 0945  Weight: 108 kg     Radiology/Studies:  DG Chest 2 View  Result Date: 03/16/2020 CLINICAL DATA:  Shortness of breath, chest pain. EXAM: CHEST - 2 VIEW COMPARISON:  None. FINDINGS: The  heart size and mediastinal contours are within normal limits. No pneumothorax or pleural effusion is noted. Mild bibasilar subsegmental atelectasis or edema is noted. The visualized skeletal structures are unremarkable. IMPRESSION: Mild bibasilar subsegmental atelectasis or edema. Electronically Signed    By: Lupita Raider M.D.   On: 03/16/2020 10:29   CARDIAC CATHETERIZATION  Result Date: 03/17/2020  Prox RCA lesion is 95% stenosed.  Mid RCA lesion is 60% stenosed.  RPDA lesion is 70% stenosed.  Ost LAD to Prox LAD lesion is 99% stenosed.  Prox LAD lesion is 90% stenosed.  RPAV lesion is 85% stenosed.  Mid LAD lesion is 100% stenosed.  1st Diag lesion is 99% stenosed.  Ost Cx to Prox Cx lesion is 99% stenosed.  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  79 year old male with no previous cardiovascular disease housing acute systolic dysfunction congestive heart failure and non-ST elevation myocardial infarction Left ventricle showing severe dilation and global LV systolic dysfunction with ejection fraction of 25% Severe three-vessel coronary artery disease with ostial LAD circumflex and right coronary artery involvement with diffuse distal disease as well Plan 1.  Continue diuresis with intravenous Lasix Aspirin and heparin for myocardial infarction Beta-blocker ACE inhibitor for cardiomyopathy LV systolic dysfunction High intensity cholesterol therapy Further consultation with cardiovascular surgery for the possibility of coronary artery bypass grafting for critical three-vessel coronary artery disease and revascularization versus medical management and limited PCI and stent placement Cardiac rehabilitation   ECHOCARDIOGRAM COMPLETE  Result Date: 03/17/2020    ECHOCARDIOGRAM REPORT   Patient Name:   Aaron Sosa Date of Exam: 03/17/2020 Medical Rec #:  [id number]     Height:       68.0 in Accession #:    [id number]    Weight:       238.0 lb Date of Birth:  1941-03-22      BSA:          2.201 m Patient Age:    78 years      BP:           123/88 mmHg Patient Gender: M             HR:           75 bpm. Exam Location:  [facility] Procedure: 2D Echo, Color Doppler and Cardiac Doppler Indications:     R06.00 Dyspnea  History:         Patient has no prior history of Echocardiogram  examinations.                  Signs/Symptoms:Edema; Risk Factors:Diabetes.  Sonographer:     Humphrey Rolls RDCS (AE) Referring Phys:  [id number] Inetta Fermo LAI Diagnosing Phys: Arnoldo Hooker MD  Sonographer Comments: Suboptimal apical window and no subcostal window. IMPRESSIONS  1. Left ventricular ejection fraction, by estimation, is 20 to 25%. The left ventricle has severely decreased function. The left ventricle demonstrates global hypokinesis. The left ventricular internal cavity size was severely dilated. There is mild left ventricular hypertrophy. Left ventricular diastolic parameters were normal.  2. Right ventricular systolic function is normal. The right ventricular size is normal.  3. Left atrial size was mild to moderately dilated.  4. Right atrial size was mildly dilated.  5. The mitral valve is normal in structure. Moderate mitral valve regurgitation.  6. Tricuspid valve regurgitation is mild to moderate.  7. The aortic valve is normal in structure. Aortic valve regurgitation is mild. FINDINGS  Left Ventricle: Left ventricular ejection fraction, by estimation,  is 20 to 25%. The left ventricle has severely decreased function. The left ventricle demonstrates global hypokinesis. The left ventricular internal cavity size was severely dilated. There is mild left ventricular hypertrophy. Left ventricular diastolic parameters were normal. Right Ventricle: The right ventricular size is normal. No increase in right ventricular wall thickness. Right ventricular systolic function is normal. Left Atrium: Left atrial size was mild to moderately dilated. Right Atrium: Right atrial size was mildly dilated. Pericardium: There is no evidence of pericardial effusion. Mitral Valve: The mitral valve is normal in structure. Moderate mitral valve regurgitation. MV peak gradient, 5.4 mmHg. The mean mitral valve gradient is 3.0 mmHg. Tricuspid Valve: The tricuspid valve is normal in structure. Tricuspid valve regurgitation is mild to  moderate. Aortic Valve: The aortic valve is normal in structure. Aortic valve regurgitation is mild. Aortic valve mean gradient measures 3.0 mmHg. Aortic valve peak gradient measures 4.2 mmHg. Aortic valve area, by VTI measures 3.23 cm. Pulmonic Valve: The pulmonic valve was normal in structure. Pulmonic valve regurgitation is trivial. Aorta: The aortic root and ascending aorta are structurally normal, with no evidence of dilitation. IAS/Shunts: No atrial level shunt detected by color flow Doppler.  LEFT VENTRICLE PLAX 2D LVIDd:         5.59 cm  Diastology LVIDs:         4.07 cm  LV e' lateral:   8.16 cm/s LV PW:         1.21 cm  LV E/e' lateral: 12.2 LV IVS:        1.09 cm  LV e' medial:    8.49 cm/s LVOT diam:     2.60 cm  LV E/e' medial:  11.8 LV SV:         59 LV SV Index:   27 LVOT Area:     5.31 cm  LEFT ATRIUM             Index LA diam:        5.30 cm 2.41 cm/m LA Vol (A2C):   79.9 ml 36.31 ml/m LA Vol (A4C):   91.8 ml 41.72 ml/m LA Biplane Vol: 86.0 ml 39.08 ml/m  AORTIC VALVE                   PULMONIC VALVE AV Area (Vmax):    3.33 cm    PV Vmax:       0.79 m/s AV Area (Vmean):   3.11 cm    PV Vmean:      54.000 cm/s AV Area (VTI):     3.23 cm    PV VTI:        0.136 m AV Vmax:           103.00 cm/s PV Peak grad:  2.5 mmHg AV Vmean:          74.700 cm/s PV Mean grad:  1.0 mmHg AV VTI:            0.184 m AV Peak Grad:      4.2 mmHg AV Mean Grad:      3.0 mmHg LVOT Vmax:         64.60 cm/s LVOT Vmean:        43.800 cm/s LVOT VTI:          0.112 m LVOT/AV VTI ratio: 0.61  AORTA Ao Root diam: 4.00 cm MITRAL VALVE MV Area (PHT): 6.27 cm    SHUNTS MV Peak grad:  5.4 mmHg    Systemic VTI:  0.11 m MV Mean grad:  3.0 mmHg    Systemic Diam: 2.60 cm MV Vmax:       1.16 m/s MV Vmean:      75.7 cm/s MV Decel Time: 121 msec MV E velocity: 99.80 cm/s MV A velocity: 59.60 cm/s MV E/A ratio:  1.67 Arnoldo Hooker MD Electronically signed by Arnoldo Hooker MD Signature Date/Time: 03/17/2020/5:15:10 PM    Final       Assessment and Recommendation  79 y.o. male 79 year old male with acute systolic dysfunction congestive heart failure and non-ST elevation myocardial infarction with cardiac catheterization showing severe LV systolic dysfunction with dilation of the left ventricle with moderate mitral vegetation and severe three-vessel coronary artery disease 1.  Continue aspirin for further risk reduction of myocardial infarction 2.  Begin beta-blocker ACE inhibitor as able for cardiomyopathy and congestive heart failure 3.  Nitrates for chest discomfort 4.  Lasix for continued treatment of lower extremity edema pulmonary edema 5.  Discussion with the family for the possibility of coronary to bypass surgery and discussions of other interventions as able and will transfer to [facility] for these interventions as per family request .  Signed, Arnoldo Hooker M.D. FACC

## 2020-03-18 NOTE — Chronic Care Management (AMB) (Signed)
  Care Management   Follow Up Note   03/18/2020 Name: AJAX SCHROLL MRN: [id number] DOB: [birth year]  Referred by: [name], DO Reason for referral : Care Coordination   Jeremiyah GRACYN SANTILLANES is a 79 y.o. year old male who is a primary care patient of [name], DO. The care management team was consulted for assistance with care management and care coordination needs.    Review of patient status, including review of consultants reports, relevant laboratory and other test results, and collaboration with appropriate care team members and the patient's provider was performed as part of comprehensive patient evaluation and provision of chronic care management services.    LCSW completed CCM outreach attempt today but was unable to reach patient successfully. A HIPPA compliant voice message was left encouraging patient to return call once available. LCSW rescheduled CCM SW appointment as well.  A HIPPA compliant phone message was left for the patient providing contact information and requesting a return call.   Dickie La, BSW, MSW, LCSW [facility] [place]  [facility] [email or web address] Phone: [phone]

## 2020-03-18 NOTE — Plan of Care (Signed)
The patient has been transfer to [facility]. Report given to RN Tine. Bed assigned to the patient is 3319. Vitals upon discharge T 97.7, P 70, R 17, BP 123/88, O2 97% on room air. Blood sugar 99. The patient has been been independent in the room with minimal assist. The patient has left with two IV's one of the R arm and one on the L arm. Full code. Belongings sent with patient and daughter has been with the patient. Problem: Education: Goal: Knowledge of General Education information will improve Description: Including pain rating scale, medication(s)/side effects and non-pharmacologic comfort measures Outcome: Completed/Met   Problem: Health Behavior/Discharge Planning: Goal: Ability to manage health-related needs will improve Outcome: Completed/Met   Problem: Clinical Measurements: Goal: Ability to maintain clinical measurements within normal limits will improve Outcome: Completed/Met Goal: Will remain free from infection Outcome: Completed/Met Goal: Diagnostic test results will improve Outcome: Completed/Met Goal: Respiratory complications will improve Outcome: Completed/Met Goal: Cardiovascular complication will be avoided Outcome: Completed/Met   Problem: Activity: Goal: Risk for activity intolerance will decrease Outcome: Completed/Met   Problem: Nutrition: Goal: Adequate nutrition will be maintained Outcome: Completed/Met   Problem: Coping: Goal: Level of anxiety will decrease Outcome: Completed/Met   Problem: Elimination: Goal: Will not experience complications related to bowel motility Outcome: Completed/Met Goal: Will not experience complications related to urinary retention Outcome: Completed/Met   Problem: Pain Managment: Goal: General experience of comfort will improve Outcome: Completed/Met   Problem: Safety: Goal: Ability to remain free from injury will improve Outcome: Completed/Met   Problem: Skin Integrity: Goal: Risk for impaired skin  integrity will decrease Outcome: Completed/Met

## 2020-03-18 NOTE — Discharge Summary (Signed)
Physician Discharge Summary  Aaron Sosa ZOX:[id number] DOB: [birth year] DOA: 03/16/2020  PCP: [name], DO  Admit date: 03/16/2020 Discharge date: 03/18/2020  Admitted From: home Disposition:  Transfer to [facility]  Recommendations for Outpatient Follow-up:  1. Per discharging service at [facility]: n/a  Equipment/Devices: n/a   Discharge Condition: Stable  CODE STATUS: Full  Diet recommendation: Heart Healthy / Carb Modified    Discharge Diagnoses: Principal Problem:   NSTEMI (non-ST elevated myocardial infarction) (HCC) Active Problems:   Acute exacerbation of CHF (congestive heart failure) (HCC)   Essential hypertension   Hyperlipidemia associated with type 2 diabetes mellitus (HCC)   Lower extremity edema   Obesity (BMI 30.0-34.9)    Summary of HPI and Hospital Course:  Aaron Sosa is a 79 y.o. male with medical history significant of DM2, HTN, former smoker who presented to the ED on 03/16/20 with worsening dyspnea with associated cough and lower extremity swelling.   He denied chest pain or history of CHF at time of admission.  In the ED, afebrile, HR 101, otherwise normal vitals.  Labs notable for BNP 1650, no leukocytosis, initial troponin 44 >> 275.  ECG negative for acute ischemic changes.  Chest xray showed mild bibasilar subsegmental atelectasis or edema.  Patient treated with IV Lasix and full dose ASA.  Admitted to hospitalist service with cardiology consulted.    NSTEMI (non-ST elevated myocardial infarction) - POA with chest pain, elevated troponin, no ST elevation on ECG.  Taken to cath lab on 5/19, severe three-vessel disease noted, CABG recommended. --Cardiology following, Dr. Gwen Pounds - transfering to [facility] for CABG --continue ASA, statin, beta blocker (with hold parameters) --Cardiac rehab on d/c  Acute exacerbation of systolic CHF - Resolved.  POA with edema, BNP 1250, shortness of breath.  EF 25% on cath 03/17/20.  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   Net fluid balance -5778 (as of 5/20). --strict I/O's, daily weights --Lasix 40 mg IV BID - continue --low sodium diet  Essential hypertension -  chronic, currently borderline low BP's.  Does not appear to be on antihypertensives outpatient.  Hold parameters on beta blocker.  Monitor BP.  Hyperlipidemia - continue Lipitor  Type 2 diabetes mellitus -  last A1c 7.7%.  Uncontrolled.  Hold metformin.  Sliding scale Novolog.  Lower extremity edema - chronic, stable, likely due to CHF.  Mgmt as above.  Obesity - Body mass index is 36.19 kg/m.  Complicates overall care and prognosis.   Discharge Instructions   Discharge Instructions    AMB Referral to Cardiac Rehabilitation - Phase II   Complete by: As directed    Diagnosis: NSTEMI   Diet - low sodium heart healthy   Complete by: As directed    Increase activity slowly   Complete by: As directed      Allergies as of 03/18/2020      Reactions   Atorvastatin Other (See Comments)   myalgia   Codeine    Shrimp [shellfish Allergy]       Medication List    TAKE these medications   acetaminophen 500 MG tablet Commonly known as: TYLENOL Take 2 tablets (1,000 mg total) by mouth every 8 (eight) hours as needed for mild pain, moderate pain or headache. What changed:   medication strength  how much to take  when to take this  reasons to take this   alum & mag hydroxide-simeth 200-200-20 MG/5ML suspension Commonly known as: MAALOX/MYLANTA Take 15 mLs  by mouth every 6 (six) hours as needed for indigestion or heartburn.   aspirin 81 MG EC tablet Take 1 tablet (81 mg total) by mouth daily. Start taking on: Mar 19, 2020   atorvastatin 80 MG tablet Commonly known as: LIPITOR Take 1 tablet (80 mg total) by mouth daily. Start taking on: Mar 19, 2020   calcium carbonate 1500 (600 Ca) MG Tabs tablet Commonly known as: OSCAL Take 600 mg of elemental calcium by mouth 2 (two) times daily with a meal.    calcium carbonate 500 MG chewable tablet Commonly known as: TUMS - dosed in mg elemental calcium Chew 1 tablet (200 mg of elemental calcium total) by mouth 3 (three) times daily as needed for indigestion or heartburn.   docusate sodium 100 MG capsule Commonly known as: COLACE Take 1 capsule (100 mg total) by mouth 2 (two) times daily as needed for mild constipation or moderate constipation.   enoxaparin 40 MG/0.4ML injection Commonly known as: LOVENOX Inject 0.4 mLs (40 mg total) into the skin daily.   furosemide 10 MG/ML injection Commonly known as: LASIX Inject 4 mLs (40 mg total) into the vein 2 (two) times daily.   guaiFENesin-dextromethorphan 100-10 MG/5ML syrup Commonly known as: ROBITUSSIN DM Take 10 mLs by mouth every 6 (six) hours as needed for cough.   insulin aspart 100 UNIT/ML injection Commonly known as: novoLOG Inject 0-15 Units into the skin 3 (three) times daily with meals.   magnesium 30 MG tablet Take 30 mg by mouth once.   metFORMIN 750 MG 24 hr tablet Commonly known as: GLUCOPHAGE-XR Take 1 tablet (750 mg total) by mouth 2 (two) times daily with a meal.   metoprolol tartrate 25 MG tablet Commonly known as: LOPRESSOR Take 0.5 tablets (12.5 mg total) by mouth 2 (two) times daily.   ondansetron 4 MG disintegrating tablet Commonly known as: ZOFRAN-ODT Take 1 tablet (4 mg total) by mouth every 8 (eight) hours as needed for nausea or vomiting.   ondansetron 4 MG/2ML Soln injection Commonly known as: ZOFRAN Inject 2 mLs (4 mg total) into the vein every 6 (six) hours as needed for nausea, vomiting or refractory nausea / vomiting.   OneTouch Ultra test strip Generic drug: glucose blood Use to check blood sugar up to 2 x daily   pneumococcal 23 valent vaccine 25 MCG/0.5ML injection Commonly known as: PNEUMOVAX-23 Inject 0.5 mLs into the muscle tomorrow at 10 am for 1 dose.   polyethylene glycol 17 g packet Commonly known as: MIRALAX / GLYCOLAX Take 17  g by mouth 2 (two) times daily as needed for mild constipation or moderate constipation.   ProAir HFA 108 (90 Base) MCG/ACT inhaler Generic drug: albuterol Inhale 2 puffs into the lungs every 4 (four) hours as needed for shortness of breath or wheezing.   sodium chloride flush 0.9 % Soln Commonly known as: NS Inject 3 mLs into the vein once for 1 dose.   sodium chloride flush 0.9 % Soln Commonly known as: NS Inject 3 mLs into the vein every 12 (twelve) hours.      Follow-up Information    [facility] Follow up on 03/25/2020.   Specialty: Cardiology Why: at 10:00am. Enter through the [facility] entrance Contact information: [address] [place] [address] [phone]         Allergies  Allergen Reactions  . Atorvastatin Other (See Comments)    myalgia  . Codeine   .  Shrimp [Shellfish Allergy]     Consultations:  Cardiology    Procedures/Studies: DG Chest 2 View  Result Date: 03/16/2020 CLINICAL DATA:  Shortness of breath, chest pain. EXAM: CHEST - 2 VIEW COMPARISON:  None. FINDINGS: The heart size and mediastinal contours are within normal limits. No pneumothorax or pleural effusion is noted. Mild bibasilar subsegmental atelectasis or edema is noted. The visualized skeletal structures are unremarkable. IMPRESSION: Mild bibasilar subsegmental atelectasis or edema. Electronically Signed   By: Lupita Raider M.D.   On: 03/16/2020 10:29   CARDIAC CATHETERIZATION  Result Date: 03/17/2020  Prox RCA lesion is 95% stenosed.  Mid RCA lesion is 60% stenosed.  RPDA lesion is 70% stenosed.  Ost LAD to Prox LAD lesion is 99% stenosed.  Prox LAD lesion is 90% stenosed.  RPAV lesion is 85% stenosed.  Mid LAD lesion is 100% stenosed.  1st Diag lesion is 99% stenosed.  Ost Cx to Prox Cx lesion is 99% stenosed.  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  Continue diuresis with intravenous Lasix Aspirin and heparin for myocardial infarction Beta-blocker ACE inhibitor for cardiomyopathy LV systolic dysfunction High intensity cholesterol therapy Further consultation with cardiovascular surgery for the possibility of coronary artery bypass grafting for critical three-vessel coronary artery disease and revascularization versus medical management and limited PCI and stent placement Cardiac rehabilitation   ECHOCARDIOGRAM COMPLETE  Result Date: 03/17/2020    ECHOCARDIOGRAM REPORT   Patient Name:   Aaron Sosa Date of Exam: 03/17/2020 Medical Rec #:  [id number]     Height:       68.0 in Accession #:    [id number]    Weight:       238.0 lb Date of Birth:  [birth year]      BSA:          2.201 m Patient Age:    78 years      BP:           123/88 mmHg Patient Gender: M             HR:           75 bpm. Exam Location:  [facility] Procedure: 2D Echo, Color Doppler and Cardiac Doppler Indications:     R06.00 Dyspnea  History:         Patient has no prior history of Echocardiogram examinations.                  Signs/Symptoms:Edema; Risk Factors:Diabetes.  Sonographer:     Humphrey Rolls RDCS (AE) Referring Phys:  [id number] Inetta Fermo LAI Diagnosing Phys: Arnoldo Hooker MD  Sonographer Comments: Suboptimal apical window and no subcostal window. IMPRESSIONS  1. Left ventricular ejection fraction, by estimation, is 20 to 25%. The left ventricle has severely decreased function. The left ventricle demonstrates global hypokinesis. The left ventricular internal cavity size was severely dilated. There is mild  left ventricular hypertrophy. Left ventricular diastolic parameters were normal.  2. Right ventricular systolic function is normal. The right ventricular size is normal.  3. Left atrial size was mild to moderately dilated.  4. Right atrial size was mildly dilated.  5. The mitral valve is  normal in structure. Moderate mitral valve regurgitation.  6. Tricuspid valve regurgitation is mild to moderate.  7. The aortic valve is normal in structure. Aortic valve regurgitation is mild. FINDINGS  Left Ventricle: Left ventricular ejection fraction, by estimation, is 20 to 25%. The left ventricle has severely decreased function. The left ventricle demonstrates global hypokinesis. The left ventricular internal cavity size was severely dilated. There is mild left ventricular hypertrophy. Left ventricular diastolic parameters were normal. Right Ventricle: The right ventricular size is normal. No increase in right ventricular wall thickness. Right ventricular systolic function is normal. Left Atrium: Left atrial size was mild to moderately dilated. Right Atrium: Right atrial size was mildly dilated. Pericardium: There is no evidence of pericardial effusion. Mitral Valve: The mitral valve is normal in structure. Moderate mitral valve regurgitation. MV peak gradient, 5.4 mmHg. The mean mitral valve gradient is 3.0 mmHg. Tricuspid Valve: The tricuspid valve is normal in structure. Tricuspid valve regurgitation is mild to moderate. Aortic Valve: The aortic valve is normal in structure. Aortic valve regurgitation is mild. Aortic valve mean gradient measures 3.0 mmHg. Aortic valve peak gradient measures 4.2 mmHg. Aortic valve area, by VTI measures 3.23 cm. Pulmonic Valve: The pulmonic valve was normal in structure. Pulmonic valve regurgitation is trivial. Aorta: The aortic root and ascending aorta are structurally normal, with no evidence of dilitation. IAS/Shunts: No atrial level shunt detected by color flow Doppler.  LEFT VENTRICLE  PLAX 2D LVIDd:         5.59 cm  Diastology LVIDs:         4.07 cm  LV e' lateral:   8.16 cm/s LV PW:         1.21 cm  LV E/e' lateral: 12.2 LV IVS:        1.09 cm  LV e' medial:    8.49 cm/s LVOT diam:     2.60 cm  LV E/e' medial:  11.8 LV SV:         59 LV SV Index:   27 LVOT Area:     5.31 cm  LEFT ATRIUM             Index LA diam:        5.30 cm 2.41 cm/m LA Vol (A2C):   79.9 ml 36.31 ml/m LA Vol (A4C):   91.8 ml 41.72 ml/m LA Biplane Vol: 86.0 ml 39.08 ml/m  AORTIC VALVE                   PULMONIC VALVE AV Area (Vmax):    3.33 cm    PV Vmax:       0.79 m/s AV Area (Vmean):   3.11 cm    PV Vmean:      54.000 cm/s AV Area (VTI):     3.23 cm    PV VTI:        0.136 m AV Vmax:           103.00 cm/s PV Peak grad:  2.5 mmHg AV Vmean:          74.700 cm/s PV Mean grad:  1.0 mmHg AV VTI:            0.184 m AV Peak Grad:      4.2 mmHg AV Mean Grad:      3.0 mmHg LVOT Vmax:         64.60 cm/s LVOT Vmean:        43.800 cm/s LVOT VTI:  0.112 m LVOT/AV VTI ratio: 0.61  AORTA Ao Root diam: 4.00 cm MITRAL VALVE MV Area (PHT): 6.27 cm    SHUNTS MV Peak grad:  5.4 mmHg    Systemic VTI:  0.11 m MV Mean grad:  3.0 mmHg    Systemic Diam: 2.60 cm MV Vmax:       1.16 m/s MV Vmean:      75.7 cm/s MV Decel Time: 121 msec MV E velocity: 99.80 cm/s MV A velocity: 59.60 cm/s MV E/A ratio:  1.67 Arnoldo Hooker MD Electronically signed by Arnoldo Hooker MD Signature Date/Time: 03/17/2020/5:15:10 PM    Final      Cardiac cath 03/17/20  Subjective: Patient seen with daughter at bedside this AM.  He denies CP or SOB.  He is agreeable with transfer to [facility] for surgery but anxious to get home.  No acute events reported.   Discharge Exam: Vitals:   03/18/20 0922 03/18/20 1142  BP: 116/63 122/77  Pulse: 81 71  Resp: 20 20  Temp: 98.2 F (36.8 C) 98.7 F (37.1 C)  SpO2: 97% 97%   Vitals:   03/17/20 2002 03/18/20 0451 03/18/20 0922 03/18/20 1142  BP: 128/66 138/89 116/63 122/77  Pulse: 86 90 81 71  Resp: Temp: (!) 97.5 F (36.4 C) 97.6 F (36.4 C) 98.2 F (36.8 C) 98.7 F (37.1 C)  TempSrc: Oral Oral Oral Oral  SpO2: 96% 100% 97% 97%  Weight:      Height:        General: Pt is alert, awake, not in acute distress Cardiovascular: RRR, S1/S2 +, no rubs, no gallops Respiratory: CTA bilaterally, no wheezing, no rhonchi Abdominal: Soft, NT, ND, bowel sounds + Extremities: tense b/l lower extremity edema and hyperpigmentation, no cyanosis    The results of significant diagnostics from this hospitalization (including imaging, microbiology, ancillary and laboratory) are listed below for reference.     Microbiology: Recent Results (from the past 240 hour(s))  SARS Coronavirus 2 by RT PCR (hospital order, performed in [facility] hospital lab) Nasopharyngeal Nasopharyngeal Swab     Status: None   Collection Time: 03/16/20  2:23 PM   Specimen: Nasopharyngeal Swab  Result Value Ref Range Status   SARS Coronavirus 2 NEGATIVE NEGATIVE Final    Comment: (NOTE) SARS-CoV-2 target nucleic acids are NOT DETECTED. The SARS-CoV-2 RNA is generally detectable in upper and lower respiratory specimens during the acute phase of infection. The lowest concentration of SARS-CoV-2 viral copies this assay can detect is 250 copies / mL. A negative result does not preclude SARS-CoV-2 infection and should not be used as the sole basis for treatment or other patient management decisions.  A negative result may occur with improper specimen collection / handling, submission of specimen other than nasopharyngeal swab, presence of viral mutation(s) within the areas targeted by this assay, and inadequate number of viral copies (<250 copies / mL). A negative result must be combined with clinical observations, patient history, and epidemiological information. Fact Sheet for Patients:   BoilerBrush.com.cy Fact Sheet for Healthcare  Providers: https://pope.com/ This test is not yet approved or cleared  by the [place] FDA and has been authorized for detection and/or diagnosis of SARS-CoV-2 by FDA under an Emergency Use Authorization (EUA).  This EUA will remain in effect (meaning this test can be used) for the duration of the COVID-19 declaration under Section 564(b)(1) of the Act, 21 U.S.C. section 360bbb-3(b)(1), unless the authorization is terminated or revoked sooner.  Performed at [facility], [address] [place], [place] [address]      Labs: BNP (last 3 results) Recent Labs    03/16/20 0950  BNP 1,650.5*   Basic Metabolic Panel: Recent Labs  Lab 03/16/20 0950 03/17/20 0211 03/18/20 0515  NA 138 139 139  K 4.0 3.6 4.2  CL 104 102 99  CO2 GLUCOSE 202* 134* 132*  BUN CREATININE 1.00 0.86 1.08  CALCIUM 8.4* 8.4* 8.6*  MG  --  1.8 2.0   Liver Function Tests: No results for input(s): AST, ALT, ALKPHOS, BILITOT, PROT, ALBUMIN in the last 168 hours. No results for input(s): LIPASE, AMYLASE in the last 168 hours. No results for input(s): AMMONIA in the last 168 hours. CBC: Recent Labs  Lab 03/16/20 0950 03/17/20 0211 03/18/20 0515  WBC 9.5 8.8 9.4  HGB 13.1 13.1 13.7  HCT 40.9 39.6 42.8  MCV 88.1 84.8 88.1  PLT 238 218 227   Cardiac Enzymes: No results for input(s): CKTOTAL, CKMB, CKMBINDEX, TROPONINI in the last 168 hours. BNP: Invalid input(s): POCBNP CBG: Recent Labs  Lab 03/17/20 1144 03/17/20 1646 03/17/20 2004 03/18/20 0728 03/18/20 1140  GLUCAP 118* 130* 151* 115* 168*   D-Dimer No results for input(s): DDIMER in the last 72 hours. Hgb A1c No results for input(s): HGBA1C in the last 72 hours. Lipid Profile Recent Labs    03/16/20 1242  CHOL 171  HDL 43  LDLCALC 103*  TRIG 123  CHOLHDL 4.0   Thyroid function studies Recent Labs    03/16/20 1242  TSH 1.401   Anemia work up No results for  input(s): VITAMINB12, FOLATE, FERRITIN, TIBC, IRON, RETICCTPCT in the last 72 hours. Urinalysis No results found for: COLORURINE, APPEARANCEUR, LABSPEC, PHURINE, GLUCOSEU, HGBUR, BILIRUBINUR, KETONESUR, PROTEINUR, UROBILINOGEN, NITRITE, LEUKOCYTESUR Sepsis Labs Invalid input(s): PROCALCITONIN,  WBC,  LACTICIDVEN Microbiology Recent Results (from the past 240 hour(s))  SARS Coronavirus 2 by RT PCR (hospital order, performed in [facility] hospital lab) Nasopharyngeal Nasopharyngeal Swab     Status: None   Collection Time: 03/16/20  2:23 PM   Specimen: Nasopharyngeal Swab  Result Value Ref Range Status   SARS Coronavirus 2 NEGATIVE NEGATIVE Final    Comment: (NOTE) SARS-CoV-2 target nucleic acids are NOT DETECTED. The SARS-CoV-2 RNA is generally detectable in upper and lower respiratory specimens during the acute phase of infection. The lowest concentration of SARS-CoV-2 viral copies this assay can detect is 250 copies / mL. A negative result does not preclude SARS-CoV-2 infection and should not be used as the sole basis for treatment or other patient management decisions.  A negative result may occur with improper specimen collection / handling, submission of specimen other than nasopharyngeal swab, presence of viral mutation(s) within the areas targeted by this assay, and inadequate number of viral copies (<250 copies / mL). A negative result must be combined with clinical observations, patient history, and epidemiological information. Fact Sheet for Patients:   BoilerBrush.com.cy Fact Sheet for Healthcare Providers: https://pope.com/ This test is not yet approved or cleared  by the [place] FDA and has been authorized for detection and/or diagnosis of SARS-CoV-2 by FDA under an Emergency Use Authorization (EUA).  This EUA will remain in effect (meaning this test can be used) for the duration of the COVID-19 declaration under  Section 564(b)(1) of the Act, 21 U.S.C. section 360bbb-3(b)(1), unless the authorization is terminated or revoked sooner. Performed at [facility], [address] [place], [place] [address]      Time coordinating discharge: Over 30 minutes  SIGNED:   Pennie Banter, DO [facility] 03/18/2020, 2:12 PM   If 7PM-7AM, please contact night-coverage [email or web address]

## 2020-03-19 MED ORDER — ACETAMINOPHEN 325 MG PO TABS
975.00 | ORAL_TABLET | ORAL | Status: DC
Start: 2020-03-22 — End: 2020-03-19

## 2020-03-19 MED ORDER — ASPIRIN 81 MG PO TBEC
81.00 | DELAYED_RELEASE_TABLET | ORAL | Status: DC
Start: 2020-03-28 — End: 2020-03-19

## 2020-03-19 MED ORDER — CHLORHEXIDINE GLUCONATE 0.12 % MT SOLN
15.00 | OROMUCOSAL | Status: DC
Start: 2020-03-22 — End: 2020-03-19

## 2020-03-19 MED ORDER — DSS 100 MG PO CAPS
100.00 | ORAL_CAPSULE | ORAL | Status: DC
Start: 2020-03-24 — End: 2020-03-19

## 2020-03-19 MED ORDER — CEFAZOLIN SODIUM-DEXTROSE 2-4 GM/100ML-% IV SOLN
2.00 | INTRAVENOUS | Status: DC
Start: 2020-03-22 — End: 2020-03-19

## 2020-03-19 MED ORDER — LIDOCAINE HCL 1 % IJ SOLN
0.50 | INTRAMUSCULAR | Status: DC
Start: ? — End: 2020-03-19

## 2020-03-19 MED ORDER — SENNOSIDES 8.6 MG PO TABS
1.00 | ORAL_TABLET | ORAL | Status: DC
Start: 2020-03-25 — End: 2020-03-19

## 2020-03-19 MED ORDER — METFORMIN HCL ER 500 MG PO TB24
500.00 | ORAL_TABLET | ORAL | Status: DC
Start: 2020-03-26 — End: 2020-03-19

## 2020-03-19 MED ORDER — FUROSEMIDE 10 MG/ML IJ SOLN
20.00 | INTRAMUSCULAR | Status: DC
Start: 2020-03-19 — End: 2020-03-19

## 2020-03-24 MED ORDER — ROSUVASTATIN CALCIUM 10 MG PO TABS
20.00 | ORAL_TABLET | ORAL | Status: DC
Start: 2020-03-27 — End: 2020-03-24

## 2020-03-24 MED ORDER — SACUBITRIL-VALSARTAN 24-26 MG PO TABS
1.00 | ORAL_TABLET | ORAL | Status: DC
Start: 2020-03-27 — End: 2020-03-24

## 2020-03-24 MED ORDER — METOPROLOL SUCCINATE ER 25 MG PO TB24
12.50 | ORAL_TABLET | ORAL | Status: DC
Start: 2020-03-28 — End: 2020-03-24

## 2020-03-24 MED ORDER — HEPARIN SOD (PORCINE) IN D5W 100 UNIT/ML IV SOLN
1250.00 | INTRAVENOUS | Status: DC
Start: ? — End: 2020-03-24

## 2020-03-24 MED ORDER — INSULIN REGULAR HUMAN 100 UNIT/ML IJ SOLN
0.00 | INTRAMUSCULAR | Status: DC
Start: 2020-03-27 — End: 2020-03-24

## 2020-03-24 MED ORDER — SIMETHICONE 80 MG PO CHEW
80.00 | CHEWABLE_TABLET | ORAL | Status: DC
Start: ? — End: 2020-03-24

## 2020-03-24 MED ORDER — DEXTROSE 50 % IV SOLN
12.50 | INTRAVENOUS | Status: DC
Start: ? — End: 2020-03-24

## 2020-03-24 MED ORDER — GLUCAGON (RDNA) 1 MG IJ KIT
1.00 | PACK | INTRAMUSCULAR | Status: DC
Start: ? — End: 2020-03-24

## 2020-03-24 MED ORDER — SPIRONOLACTONE 25 MG PO TABS
12.50 | ORAL_TABLET | ORAL | Status: DC
Start: 2020-03-27 — End: 2020-03-24

## 2020-03-25 ENCOUNTER — Ambulatory Visit: Payer: PPO | Admitting: Family

## 2020-03-26 MED ORDER — ACETAMINOPHEN 325 MG PO TABS
650.00 | ORAL_TABLET | ORAL | Status: DC
Start: ? — End: 2020-03-26

## 2020-03-26 MED ORDER — POLYETHYLENE GLYCOL 3350 17 GM/SCOOP PO POWD
17.00 | ORAL | Status: DC
Start: 2020-03-28 — End: 2020-03-26

## 2020-03-26 MED ORDER — SENNOSIDES-DOCUSATE SODIUM 8.6-50 MG PO TABS
2.00 | ORAL_TABLET | ORAL | Status: DC
Start: 2020-03-27 — End: 2020-03-26

## 2020-03-27 MED ORDER — FUROSEMIDE 80 MG PO TABS
80.00 | ORAL_TABLET | ORAL | Status: DC
Start: 2020-03-27 — End: 2020-03-27

## 2020-03-27 MED ORDER — METFORMIN HCL ER 500 MG PO TB24
500.00 | ORAL_TABLET | ORAL | Status: DC
Start: 2020-03-27 — End: 2020-03-27

## 2020-03-27 MED ORDER — CLOPIDOGREL BISULFATE 75 MG PO TABS
75.00 | ORAL_TABLET | ORAL | Status: DC
Start: 2020-03-28 — End: 2020-03-27

## 2020-03-27 MED ORDER — SPIRONOLACTONE 25 MG PO TABS
25.00 | ORAL_TABLET | ORAL | Status: DC
Start: ? — End: 2020-03-27

## 2020-04-02 DIAGNOSIS — I10 Essential (primary) hypertension: Secondary | ICD-10-CM | POA: Diagnosis not present

## 2020-04-02 DIAGNOSIS — E785 Hyperlipidemia, unspecified: Secondary | ICD-10-CM | POA: Diagnosis not present

## 2020-04-02 DIAGNOSIS — I2510 Atherosclerotic heart disease of native coronary artery without angina pectoris: Secondary | ICD-10-CM | POA: Diagnosis not present

## 2020-04-02 DIAGNOSIS — E119 Type 2 diabetes mellitus without complications: Secondary | ICD-10-CM | POA: Diagnosis not present

## 2020-04-02 DIAGNOSIS — I5021 Acute systolic (congestive) heart failure: Secondary | ICD-10-CM | POA: Diagnosis not present

## 2020-04-02 DIAGNOSIS — I255 Ischemic cardiomyopathy: Secondary | ICD-10-CM | POA: Diagnosis not present

## 2020-04-08 DIAGNOSIS — I959 Hypotension, unspecified: Secondary | ICD-10-CM | POA: Diagnosis not present

## 2020-04-08 DIAGNOSIS — I255 Ischemic cardiomyopathy: Secondary | ICD-10-CM | POA: Diagnosis not present

## 2020-04-08 DIAGNOSIS — I2510 Atherosclerotic heart disease of native coronary artery without angina pectoris: Secondary | ICD-10-CM | POA: Diagnosis not present

## 2020-04-08 DIAGNOSIS — I10 Essential (primary) hypertension: Secondary | ICD-10-CM | POA: Diagnosis not present

## 2020-04-08 DIAGNOSIS — I5020 Unspecified systolic (congestive) heart failure: Secondary | ICD-10-CM | POA: Diagnosis not present

## 2020-04-08 DIAGNOSIS — I5021 Acute systolic (congestive) heart failure: Secondary | ICD-10-CM | POA: Diagnosis not present

## 2020-04-23 DIAGNOSIS — E785 Hyperlipidemia, unspecified: Secondary | ICD-10-CM | POA: Diagnosis not present

## 2020-04-23 DIAGNOSIS — I5020 Unspecified systolic (congestive) heart failure: Secondary | ICD-10-CM | POA: Diagnosis not present

## 2020-04-23 DIAGNOSIS — I10 Essential (primary) hypertension: Secondary | ICD-10-CM | POA: Diagnosis not present

## 2020-04-23 DIAGNOSIS — I2510 Atherosclerotic heart disease of native coronary artery without angina pectoris: Secondary | ICD-10-CM | POA: Diagnosis not present

## 2020-04-23 DIAGNOSIS — E119 Type 2 diabetes mellitus without complications: Secondary | ICD-10-CM | POA: Diagnosis not present

## 2020-04-23 DIAGNOSIS — I255 Ischemic cardiomyopathy: Secondary | ICD-10-CM | POA: Diagnosis not present

## 2020-04-23 DIAGNOSIS — I5022 Chronic systolic (congestive) heart failure: Secondary | ICD-10-CM | POA: Diagnosis not present

## 2020-05-21 DIAGNOSIS — E785 Hyperlipidemia, unspecified: Secondary | ICD-10-CM | POA: Diagnosis not present

## 2020-05-21 DIAGNOSIS — I10 Essential (primary) hypertension: Secondary | ICD-10-CM | POA: Diagnosis not present

## 2020-05-21 DIAGNOSIS — I2510 Atherosclerotic heart disease of native coronary artery without angina pectoris: Secondary | ICD-10-CM | POA: Diagnosis not present

## 2020-05-21 DIAGNOSIS — I5022 Chronic systolic (congestive) heart failure: Secondary | ICD-10-CM | POA: Diagnosis not present

## 2020-05-21 DIAGNOSIS — R0600 Dyspnea, unspecified: Secondary | ICD-10-CM | POA: Diagnosis not present

## 2020-05-21 DIAGNOSIS — I255 Ischemic cardiomyopathy: Secondary | ICD-10-CM | POA: Diagnosis not present

## 2020-06-01 ENCOUNTER — Ambulatory Visit: Payer: PPO

## 2020-06-01 ENCOUNTER — Ambulatory Visit: Payer: PPO | Admitting: Family Medicine

## 2020-06-02 ENCOUNTER — Ambulatory Visit: Payer: PPO | Admitting: Family Medicine

## 2020-06-28 ENCOUNTER — Other Ambulatory Visit: Payer: Self-pay | Admitting: Family Medicine

## 2020-06-28 DIAGNOSIS — E1136 Type 2 diabetes mellitus with diabetic cataract: Secondary | ICD-10-CM

## 2020-06-29 MED ORDER — ONETOUCH ULTRA VI STRP: ORAL_STRIP | 12 refills | Status: AC

## 2020-07-06 DIAGNOSIS — R0600 Dyspnea, unspecified: Secondary | ICD-10-CM | POA: Diagnosis not present

## 2020-07-06 DIAGNOSIS — I5022 Chronic systolic (congestive) heart failure: Secondary | ICD-10-CM | POA: Diagnosis not present

## 2020-07-09 DIAGNOSIS — I10 Essential (primary) hypertension: Secondary | ICD-10-CM | POA: Diagnosis not present

## 2020-07-09 DIAGNOSIS — I2510 Atherosclerotic heart disease of native coronary artery without angina pectoris: Secondary | ICD-10-CM | POA: Diagnosis not present

## 2020-07-09 DIAGNOSIS — E785 Hyperlipidemia, unspecified: Secondary | ICD-10-CM | POA: Diagnosis not present

## 2020-07-09 DIAGNOSIS — Z23 Encounter for immunization: Secondary | ICD-10-CM | POA: Diagnosis not present

## 2020-07-09 DIAGNOSIS — I5022 Chronic systolic (congestive) heart failure: Secondary | ICD-10-CM | POA: Diagnosis not present

## 2020-07-09 DIAGNOSIS — I255 Ischemic cardiomyopathy: Secondary | ICD-10-CM | POA: Diagnosis not present

## 2020-07-09 DIAGNOSIS — I5021 Acute systolic (congestive) heart failure: Secondary | ICD-10-CM | POA: Diagnosis not present

## 2020-08-20 DIAGNOSIS — I517 Cardiomegaly: Secondary | ICD-10-CM | POA: Diagnosis not present

## 2020-08-20 DIAGNOSIS — Z0000 Encounter for general adult medical examination without abnormal findings: Secondary | ICD-10-CM | POA: Diagnosis not present

## 2020-08-20 DIAGNOSIS — I5022 Chronic systolic (congestive) heart failure: Secondary | ICD-10-CM | POA: Diagnosis not present

## 2020-08-20 DIAGNOSIS — I10 Essential (primary) hypertension: Secondary | ICD-10-CM | POA: Diagnosis not present

## 2020-08-20 DIAGNOSIS — E119 Type 2 diabetes mellitus without complications: Secondary | ICD-10-CM | POA: Diagnosis not present

## 2020-08-20 DIAGNOSIS — I2510 Atherosclerotic heart disease of native coronary artery without angina pectoris: Secondary | ICD-10-CM | POA: Diagnosis not present

## 2020-08-20 DIAGNOSIS — E785 Hyperlipidemia, unspecified: Secondary | ICD-10-CM | POA: Diagnosis not present

## 2020-08-20 DIAGNOSIS — J9811 Atelectasis: Secondary | ICD-10-CM | POA: Diagnosis not present

## 2020-08-20 DIAGNOSIS — I255 Ischemic cardiomyopathy: Secondary | ICD-10-CM | POA: Diagnosis not present

## 2020-08-20 DIAGNOSIS — I25118 Atherosclerotic heart disease of native coronary artery with other forms of angina pectoris: Secondary | ICD-10-CM | POA: Diagnosis not present

## 2020-09-22 DIAGNOSIS — I509 Heart failure, unspecified: Secondary | ICD-10-CM | POA: Diagnosis not present

## 2020-09-22 DIAGNOSIS — R0602 Shortness of breath: Secondary | ICD-10-CM | POA: Diagnosis not present

## 2020-10-01 DIAGNOSIS — E785 Hyperlipidemia, unspecified: Secondary | ICD-10-CM | POA: Diagnosis not present

## 2020-10-01 DIAGNOSIS — I25118 Atherosclerotic heart disease of native coronary artery with other forms of angina pectoris: Secondary | ICD-10-CM | POA: Diagnosis not present

## 2020-10-01 DIAGNOSIS — I5022 Chronic systolic (congestive) heart failure: Secondary | ICD-10-CM | POA: Diagnosis not present

## 2020-10-01 DIAGNOSIS — I10 Essential (primary) hypertension: Secondary | ICD-10-CM | POA: Diagnosis not present

## 2020-10-01 DIAGNOSIS — I255 Ischemic cardiomyopathy: Secondary | ICD-10-CM | POA: Diagnosis not present

## 2020-10-19 DIAGNOSIS — I255 Ischemic cardiomyopathy: Secondary | ICD-10-CM | POA: Diagnosis not present

## 2020-12-10 DIAGNOSIS — H26491 Other secondary cataract, right eye: Secondary | ICD-10-CM | POA: Diagnosis not present

## 2020-12-10 DIAGNOSIS — E113293 Type 2 diabetes mellitus with mild nonproliferative diabetic retinopathy without macular edema, bilateral: Secondary | ICD-10-CM | POA: Diagnosis not present

## 2020-12-17 DIAGNOSIS — E785 Hyperlipidemia, unspecified: Secondary | ICD-10-CM | POA: Diagnosis not present

## 2020-12-17 DIAGNOSIS — I25118 Atherosclerotic heart disease of native coronary artery with other forms of angina pectoris: Secondary | ICD-10-CM | POA: Diagnosis not present

## 2020-12-17 DIAGNOSIS — I10 Essential (primary) hypertension: Secondary | ICD-10-CM | POA: Diagnosis not present

## 2020-12-17 DIAGNOSIS — I5022 Chronic systolic (congestive) heart failure: Secondary | ICD-10-CM | POA: Diagnosis not present

## 2020-12-17 DIAGNOSIS — I255 Ischemic cardiomyopathy: Secondary | ICD-10-CM | POA: Diagnosis not present

## 2020-12-21 DIAGNOSIS — E118 Type 2 diabetes mellitus with unspecified complications: Secondary | ICD-10-CM | POA: Diagnosis not present

## 2020-12-21 DIAGNOSIS — E785 Hyperlipidemia, unspecified: Secondary | ICD-10-CM | POA: Diagnosis not present

## 2020-12-31 DIAGNOSIS — E118 Type 2 diabetes mellitus with unspecified complications: Secondary | ICD-10-CM | POA: Diagnosis not present

## 2020-12-31 DIAGNOSIS — I5022 Chronic systolic (congestive) heart failure: Secondary | ICD-10-CM | POA: Diagnosis not present

## 2020-12-31 DIAGNOSIS — I10 Essential (primary) hypertension: Secondary | ICD-10-CM | POA: Diagnosis not present

## 2020-12-31 DIAGNOSIS — I25118 Atherosclerotic heart disease of native coronary artery with other forms of angina pectoris: Secondary | ICD-10-CM | POA: Diagnosis not present

## 2021-03-11 DIAGNOSIS — I10 Essential (primary) hypertension: Secondary | ICD-10-CM | POA: Diagnosis not present

## 2021-03-11 DIAGNOSIS — I5022 Chronic systolic (congestive) heart failure: Secondary | ICD-10-CM | POA: Diagnosis not present

## 2021-03-11 DIAGNOSIS — E785 Hyperlipidemia, unspecified: Secondary | ICD-10-CM | POA: Diagnosis not present

## 2021-03-11 DIAGNOSIS — E118 Type 2 diabetes mellitus with unspecified complications: Secondary | ICD-10-CM | POA: Diagnosis not present

## 2021-03-11 DIAGNOSIS — I25118 Atherosclerotic heart disease of native coronary artery with other forms of angina pectoris: Secondary | ICD-10-CM | POA: Diagnosis not present

## 2021-05-03 DIAGNOSIS — E118 Type 2 diabetes mellitus with unspecified complications: Secondary | ICD-10-CM | POA: Diagnosis not present

## 2021-05-03 DIAGNOSIS — I25118 Atherosclerotic heart disease of native coronary artery with other forms of angina pectoris: Secondary | ICD-10-CM | POA: Diagnosis not present

## 2021-05-10 DIAGNOSIS — E785 Hyperlipidemia, unspecified: Secondary | ICD-10-CM | POA: Diagnosis not present

## 2021-05-10 DIAGNOSIS — I5022 Chronic systolic (congestive) heart failure: Secondary | ICD-10-CM | POA: Diagnosis not present

## 2021-05-10 DIAGNOSIS — I25118 Atherosclerotic heart disease of native coronary artery with other forms of angina pectoris: Secondary | ICD-10-CM | POA: Diagnosis not present

## 2021-05-10 DIAGNOSIS — I10 Essential (primary) hypertension: Secondary | ICD-10-CM | POA: Diagnosis not present

## 2021-05-10 DIAGNOSIS — E118 Type 2 diabetes mellitus with unspecified complications: Secondary | ICD-10-CM | POA: Diagnosis not present

## 2021-05-19 DIAGNOSIS — E119 Type 2 diabetes mellitus without complications: Secondary | ICD-10-CM | POA: Diagnosis not present

## 2021-05-19 DIAGNOSIS — K5900 Constipation, unspecified: Secondary | ICD-10-CM | POA: Diagnosis not present

## 2021-05-19 DIAGNOSIS — I110 Hypertensive heart disease with heart failure: Secondary | ICD-10-CM | POA: Diagnosis not present

## 2021-05-19 DIAGNOSIS — M5136 Other intervertebral disc degeneration, lumbar region: Secondary | ICD-10-CM | POA: Diagnosis not present

## 2021-05-19 DIAGNOSIS — Z7982 Long term (current) use of aspirin: Secondary | ICD-10-CM | POA: Diagnosis not present

## 2021-05-19 DIAGNOSIS — I5022 Chronic systolic (congestive) heart failure: Secondary | ICD-10-CM | POA: Diagnosis not present

## 2021-05-19 DIAGNOSIS — Z7984 Long term (current) use of oral hypoglycemic drugs: Secondary | ICD-10-CM | POA: Diagnosis not present

## 2021-05-19 DIAGNOSIS — I2510 Atherosclerotic heart disease of native coronary artery without angina pectoris: Secondary | ICD-10-CM | POA: Diagnosis not present

## 2021-05-19 DIAGNOSIS — R32 Unspecified urinary incontinence: Secondary | ICD-10-CM | POA: Diagnosis not present

## 2021-05-19 DIAGNOSIS — E785 Hyperlipidemia, unspecified: Secondary | ICD-10-CM | POA: Diagnosis not present

## 2021-05-19 DIAGNOSIS — Z87891 Personal history of nicotine dependence: Secondary | ICD-10-CM | POA: Diagnosis not present

## 2021-05-19 DIAGNOSIS — Z9181 History of falling: Secondary | ICD-10-CM | POA: Diagnosis not present

## 2021-05-19 DIAGNOSIS — Z79899 Other long term (current) drug therapy: Secondary | ICD-10-CM | POA: Diagnosis not present

## 2021-05-24 DIAGNOSIS — I110 Hypertensive heart disease with heart failure: Secondary | ICD-10-CM | POA: Diagnosis not present

## 2021-05-24 DIAGNOSIS — M5136 Other intervertebral disc degeneration, lumbar region: Secondary | ICD-10-CM | POA: Diagnosis not present

## 2021-05-24 DIAGNOSIS — E785 Hyperlipidemia, unspecified: Secondary | ICD-10-CM | POA: Diagnosis not present

## 2021-05-24 DIAGNOSIS — I5022 Chronic systolic (congestive) heart failure: Secondary | ICD-10-CM | POA: Diagnosis not present

## 2021-05-24 DIAGNOSIS — Z0000 Encounter for general adult medical examination without abnormal findings: Secondary | ICD-10-CM | POA: Diagnosis not present

## 2021-05-24 DIAGNOSIS — E119 Type 2 diabetes mellitus without complications: Secondary | ICD-10-CM | POA: Diagnosis not present

## 2021-05-24 DIAGNOSIS — K5900 Constipation, unspecified: Secondary | ICD-10-CM | POA: Diagnosis not present

## 2021-05-24 DIAGNOSIS — I25118 Atherosclerotic heart disease of native coronary artery with other forms of angina pectoris: Secondary | ICD-10-CM | POA: Diagnosis not present

## 2021-05-24 DIAGNOSIS — E118 Type 2 diabetes mellitus with unspecified complications: Secondary | ICD-10-CM | POA: Diagnosis not present

## 2021-05-24 DIAGNOSIS — I2510 Atherosclerotic heart disease of native coronary artery without angina pectoris: Secondary | ICD-10-CM | POA: Diagnosis not present

## 2021-06-02 DIAGNOSIS — I2510 Atherosclerotic heart disease of native coronary artery without angina pectoris: Secondary | ICD-10-CM | POA: Diagnosis not present

## 2021-06-02 DIAGNOSIS — Z7982 Long term (current) use of aspirin: Secondary | ICD-10-CM | POA: Diagnosis not present

## 2021-06-02 DIAGNOSIS — E119 Type 2 diabetes mellitus without complications: Secondary | ICD-10-CM | POA: Diagnosis not present

## 2021-06-02 DIAGNOSIS — Z9181 History of falling: Secondary | ICD-10-CM | POA: Diagnosis not present

## 2021-06-02 DIAGNOSIS — Z7984 Long term (current) use of oral hypoglycemic drugs: Secondary | ICD-10-CM | POA: Diagnosis not present

## 2021-06-02 DIAGNOSIS — E785 Hyperlipidemia, unspecified: Secondary | ICD-10-CM | POA: Diagnosis not present

## 2021-06-02 DIAGNOSIS — Z79899 Other long term (current) drug therapy: Secondary | ICD-10-CM | POA: Diagnosis not present

## 2021-06-02 DIAGNOSIS — K5900 Constipation, unspecified: Secondary | ICD-10-CM | POA: Diagnosis not present

## 2021-06-02 DIAGNOSIS — I110 Hypertensive heart disease with heart failure: Secondary | ICD-10-CM | POA: Diagnosis not present

## 2021-06-02 DIAGNOSIS — M5136 Other intervertebral disc degeneration, lumbar region: Secondary | ICD-10-CM | POA: Diagnosis not present

## 2021-06-02 DIAGNOSIS — Z87891 Personal history of nicotine dependence: Secondary | ICD-10-CM | POA: Diagnosis not present

## 2021-06-02 DIAGNOSIS — R32 Unspecified urinary incontinence: Secondary | ICD-10-CM | POA: Diagnosis not present

## 2021-06-02 DIAGNOSIS — I5022 Chronic systolic (congestive) heart failure: Secondary | ICD-10-CM | POA: Diagnosis not present

## 2021-06-29 IMAGING — CR DG CHEST 2V
1 series · 2 of 2 positions shown · non-contrast
Comparison: None.

CLINICAL DATA: Shortness of breath, chest pain.

EXAM:
CHEST - 2 VIEW

[Series 1: dg chest 2 view · 0.14mm/px · 2 of 2 slices shown]
[im 1/2]
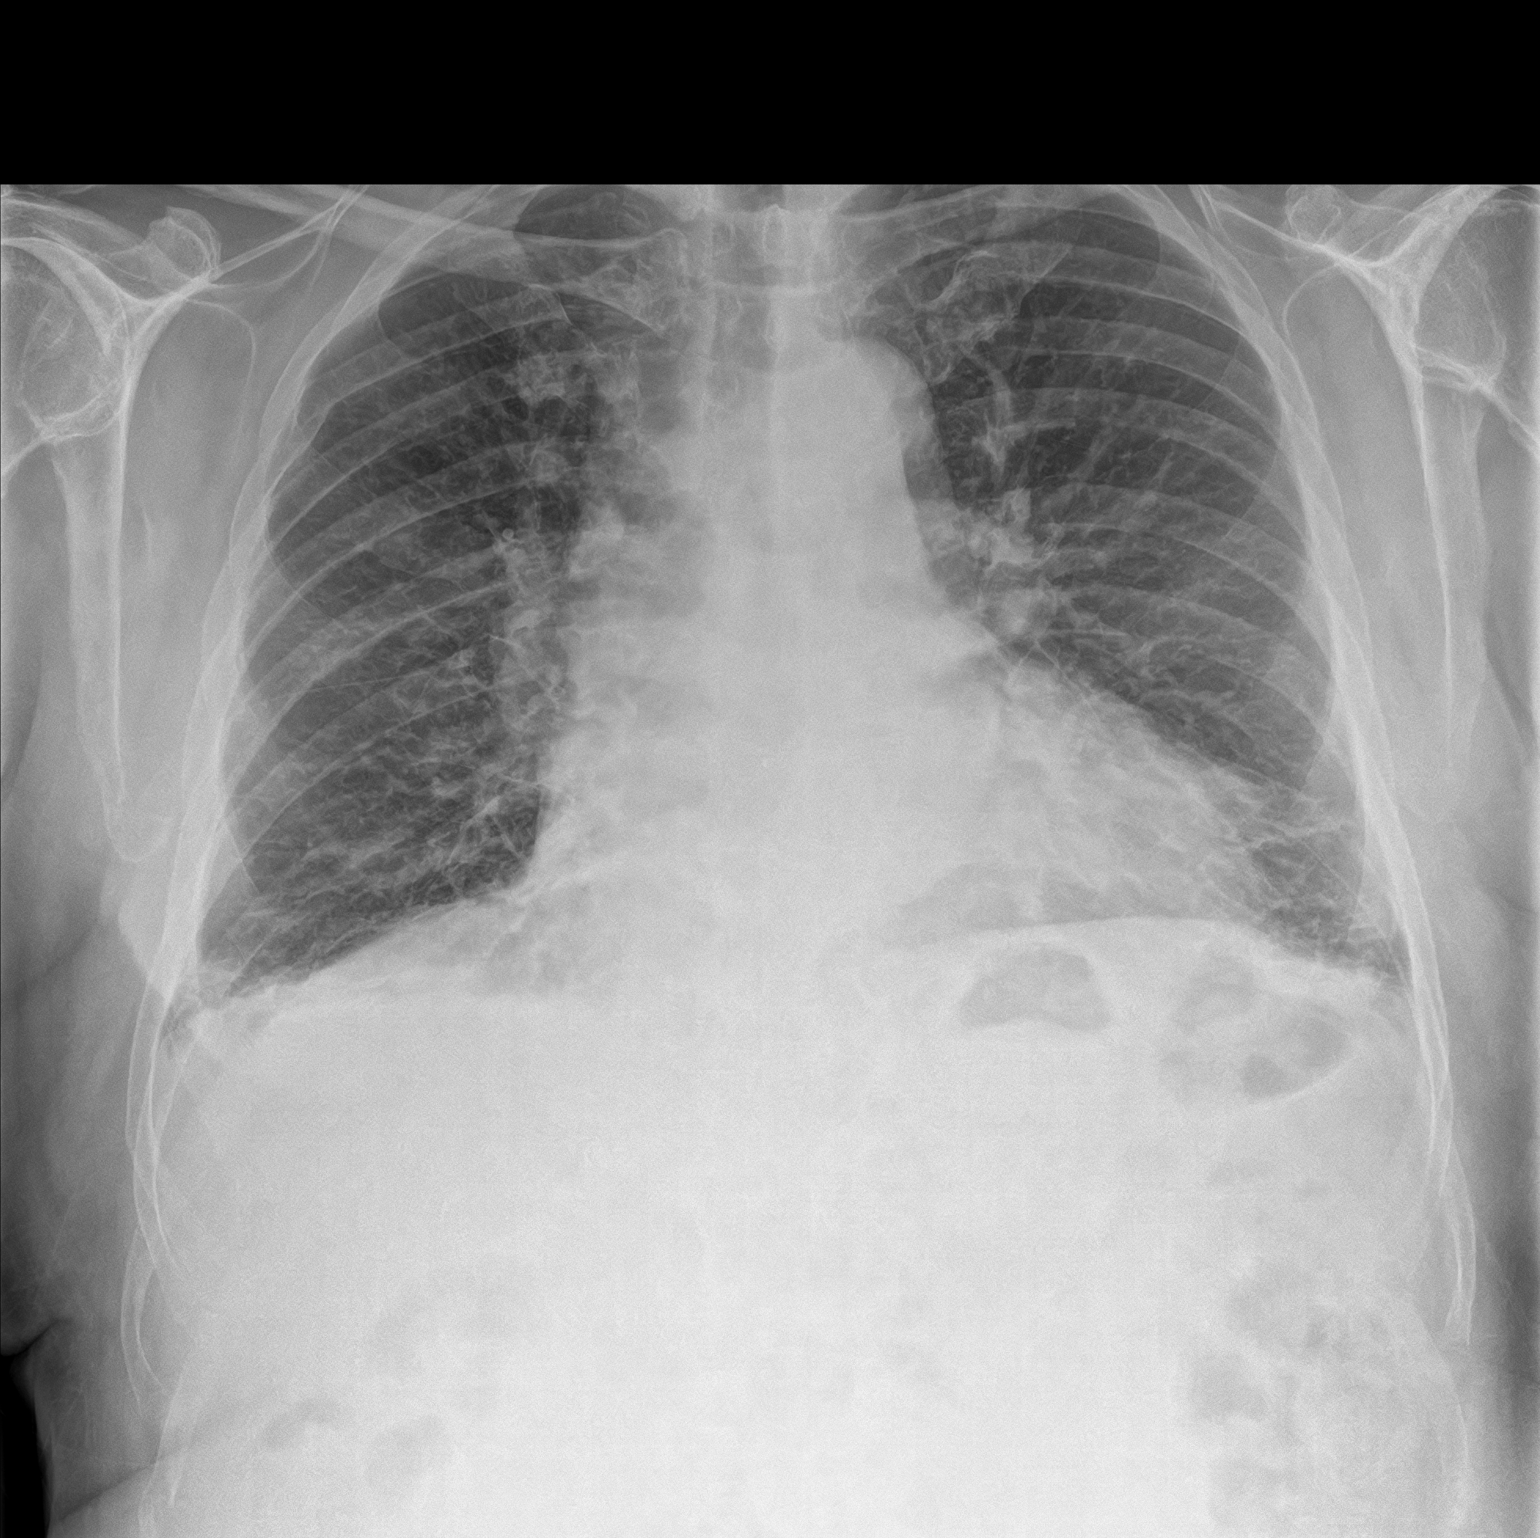
[im 2/2]
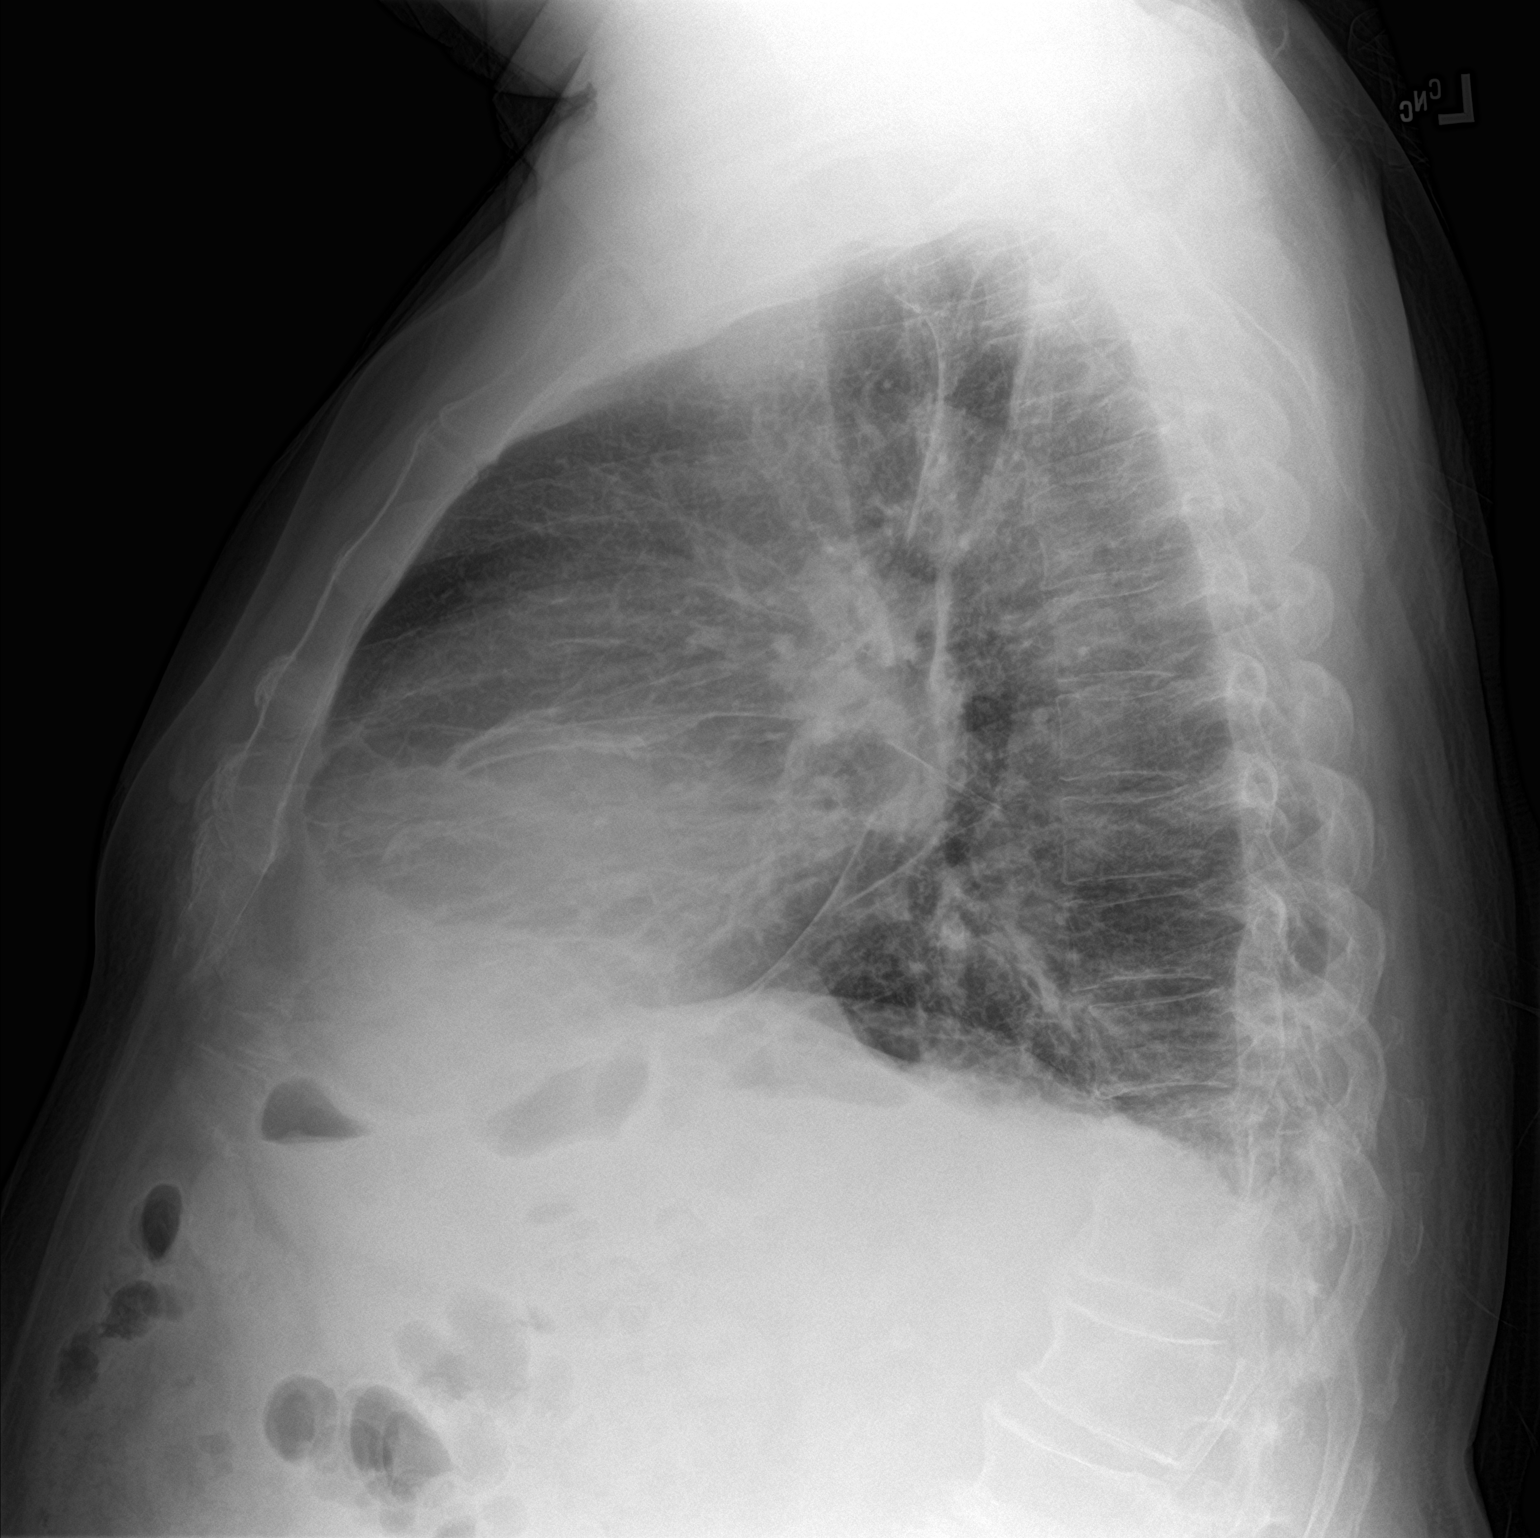

[2 of 2 positions shown; findings below may reference images not displayed]

FINDINGS: The heart size and mediastinal contours are within normal limits. No
pneumothorax or pleural effusion is noted. Mild bibasilar
subsegmental atelectasis or edema is noted. The visualized skeletal
structures are unremarkable.
IMPRESSION: Mild bibasilar subsegmental atelectasis or edema.

## 2021-08-25 DIAGNOSIS — I5022 Chronic systolic (congestive) heart failure: Secondary | ICD-10-CM | POA: Diagnosis not present

## 2021-08-25 DIAGNOSIS — I25118 Atherosclerotic heart disease of native coronary artery with other forms of angina pectoris: Secondary | ICD-10-CM | POA: Diagnosis not present

## 2021-08-25 DIAGNOSIS — I10 Essential (primary) hypertension: Secondary | ICD-10-CM | POA: Diagnosis not present

## 2021-08-25 DIAGNOSIS — E118 Type 2 diabetes mellitus with unspecified complications: Secondary | ICD-10-CM | POA: Diagnosis not present

## 2021-08-31 DIAGNOSIS — I5022 Chronic systolic (congestive) heart failure: Secondary | ICD-10-CM | POA: Diagnosis not present

## 2021-09-15 DIAGNOSIS — I517 Cardiomegaly: Secondary | ICD-10-CM | POA: Diagnosis not present

## 2021-09-15 DIAGNOSIS — R059 Cough, unspecified: Secondary | ICD-10-CM | POA: Diagnosis not present

## 2021-09-15 DIAGNOSIS — Z03818 Encounter for observation for suspected exposure to other biological agents ruled out: Secondary | ICD-10-CM | POA: Diagnosis not present

## 2021-09-15 DIAGNOSIS — U071 COVID-19: Secondary | ICD-10-CM | POA: Diagnosis not present

## 2021-09-15 DIAGNOSIS — R051 Acute cough: Secondary | ICD-10-CM | POA: Diagnosis not present

## 2021-09-15 DIAGNOSIS — J9811 Atelectasis: Secondary | ICD-10-CM | POA: Diagnosis not present

## 2021-10-07 DIAGNOSIS — E118 Type 2 diabetes mellitus with unspecified complications: Secondary | ICD-10-CM | POA: Diagnosis not present

## 2021-10-07 DIAGNOSIS — I25118 Atherosclerotic heart disease of native coronary artery with other forms of angina pectoris: Secondary | ICD-10-CM | POA: Diagnosis not present

## 2021-10-07 DIAGNOSIS — I10 Essential (primary) hypertension: Secondary | ICD-10-CM | POA: Diagnosis not present

## 2021-10-07 DIAGNOSIS — I5022 Chronic systolic (congestive) heart failure: Secondary | ICD-10-CM | POA: Diagnosis not present

## 2021-11-05 ENCOUNTER — Encounter: Payer: Self-pay | Admitting: Intensive Care

## 2021-11-05 ENCOUNTER — Emergency Department: Payer: Medicare HMO

## 2021-11-05 ENCOUNTER — Inpatient Hospital Stay
Admission: EM | Admit: 2021-11-05 | Discharge: 2021-11-14 | DRG: 291 | Disposition: A | Discharge status: Skilled Nursing Facility | Payer: Medicare HMO | Attending: Internal Medicine | Admitting: Internal Medicine

## 2021-11-05 ENCOUNTER — Other Ambulatory Visit: Payer: Self-pay

## 2021-11-05 DIAGNOSIS — W1830XA Fall on same level, unspecified, initial encounter: Secondary | ICD-10-CM | POA: Diagnosis present

## 2021-11-05 DIAGNOSIS — E860 Dehydration: Secondary | ICD-10-CM | POA: Diagnosis present

## 2021-11-05 DIAGNOSIS — I5022 Chronic systolic (congestive) heart failure: Secondary | ICD-10-CM | POA: Diagnosis not present

## 2021-11-05 DIAGNOSIS — I2510 Atherosclerotic heart disease of native coronary artery without angina pectoris: Secondary | ICD-10-CM | POA: Diagnosis present

## 2021-11-05 DIAGNOSIS — R0602 Shortness of breath: Secondary | ICD-10-CM | POA: Diagnosis not present

## 2021-11-05 DIAGNOSIS — M1990 Unspecified osteoarthritis, unspecified site: Secondary | ICD-10-CM | POA: Diagnosis present

## 2021-11-05 DIAGNOSIS — W1839XA Other fall on same level, initial encounter: Secondary | ICD-10-CM | POA: Diagnosis present

## 2021-11-05 DIAGNOSIS — Z7982 Long term (current) use of aspirin: Secondary | ICD-10-CM

## 2021-11-05 DIAGNOSIS — Z7901 Long term (current) use of anticoagulants: Secondary | ICD-10-CM

## 2021-11-05 DIAGNOSIS — I10 Essential (primary) hypertension: Secondary | ICD-10-CM | POA: Diagnosis not present

## 2021-11-05 DIAGNOSIS — D72829 Elevated white blood cell count, unspecified: Secondary | ICD-10-CM

## 2021-11-05 DIAGNOSIS — E669 Obesity, unspecified: Secondary | ICD-10-CM | POA: Diagnosis present

## 2021-11-05 DIAGNOSIS — I110 Hypertensive heart disease with heart failure: Secondary | ICD-10-CM | POA: Diagnosis not present

## 2021-11-05 DIAGNOSIS — Z794 Long term (current) use of insulin: Secondary | ICD-10-CM

## 2021-11-05 DIAGNOSIS — Z8249 Family history of ischemic heart disease and other diseases of the circulatory system: Secondary | ICD-10-CM

## 2021-11-05 DIAGNOSIS — Z885 Allergy status to narcotic agent status: Secondary | ICD-10-CM

## 2021-11-05 DIAGNOSIS — S32591A Other specified fracture of right pubis, initial encounter for closed fracture: Secondary | ICD-10-CM | POA: Diagnosis not present

## 2021-11-05 DIAGNOSIS — Z20822 Contact with and (suspected) exposure to covid-19: Secondary | ICD-10-CM | POA: Diagnosis present

## 2021-11-05 DIAGNOSIS — J9601 Acute respiratory failure with hypoxia: Secondary | ICD-10-CM | POA: Diagnosis not present

## 2021-11-05 DIAGNOSIS — G3184 Mild cognitive impairment, so stated: Secondary | ICD-10-CM | POA: Diagnosis present

## 2021-11-05 DIAGNOSIS — E782 Mixed hyperlipidemia: Secondary | ICD-10-CM | POA: Diagnosis present

## 2021-11-05 DIAGNOSIS — H9190 Unspecified hearing loss, unspecified ear: Secondary | ICD-10-CM | POA: Diagnosis present

## 2021-11-05 DIAGNOSIS — R778 Other specified abnormalities of plasma proteins: Secondary | ICD-10-CM | POA: Diagnosis present

## 2021-11-05 DIAGNOSIS — Z888 Allergy status to other drugs, medicaments and biological substances status: Secondary | ICD-10-CM

## 2021-11-05 DIAGNOSIS — I5043 Acute on chronic combined systolic (congestive) and diastolic (congestive) heart failure: Secondary | ICD-10-CM | POA: Diagnosis present

## 2021-11-05 DIAGNOSIS — E871 Hypo-osmolality and hyponatremia: Secondary | ICD-10-CM | POA: Diagnosis present

## 2021-11-05 DIAGNOSIS — D72828 Other elevated white blood cell count: Secondary | ICD-10-CM | POA: Diagnosis present

## 2021-11-05 DIAGNOSIS — Z6832 Body mass index (BMI) 32.0-32.9, adult: Secondary | ICD-10-CM

## 2021-11-05 DIAGNOSIS — T501X6A Underdosing of loop [high-ceiling] diuretics, initial encounter: Secondary | ICD-10-CM | POA: Diagnosis present

## 2021-11-05 DIAGNOSIS — Z7984 Long term (current) use of oral hypoglycemic drugs: Secondary | ICD-10-CM

## 2021-11-05 DIAGNOSIS — I509 Heart failure, unspecified: Secondary | ICD-10-CM

## 2021-11-05 DIAGNOSIS — E1165 Type 2 diabetes mellitus with hyperglycemia: Secondary | ICD-10-CM | POA: Diagnosis present

## 2021-11-05 DIAGNOSIS — R944 Abnormal results of kidney function studies: Secondary | ICD-10-CM | POA: Diagnosis present

## 2021-11-05 DIAGNOSIS — Z91138 Patient's unintentional underdosing of medication regimen for other reason: Secondary | ICD-10-CM

## 2021-11-05 DIAGNOSIS — E119 Type 2 diabetes mellitus without complications: Secondary | ICD-10-CM

## 2021-11-05 DIAGNOSIS — Y9289 Other specified places as the place of occurrence of the external cause: Secondary | ICD-10-CM

## 2021-11-05 DIAGNOSIS — R0902 Hypoxemia: Secondary | ICD-10-CM

## 2021-11-05 DIAGNOSIS — M549 Dorsalgia, unspecified: Secondary | ICD-10-CM | POA: Diagnosis present

## 2021-11-05 DIAGNOSIS — E876 Hypokalemia: Secondary | ICD-10-CM | POA: Diagnosis present

## 2021-11-05 DIAGNOSIS — Z833 Family history of diabetes mellitus: Secondary | ICD-10-CM

## 2021-11-05 DIAGNOSIS — I248 Other forms of acute ischemic heart disease: Secondary | ICD-10-CM | POA: Diagnosis present

## 2021-11-05 DIAGNOSIS — Z87891 Personal history of nicotine dependence: Secondary | ICD-10-CM

## 2021-11-05 DIAGNOSIS — Z79899 Other long term (current) drug therapy: Secondary | ICD-10-CM

## 2021-11-05 DIAGNOSIS — I255 Ischemic cardiomyopathy: Secondary | ICD-10-CM | POA: Diagnosis present

## 2021-11-05 HISTORY — DX: Heart failure, unspecified: I50.9

## 2021-11-05 LAB — RESP PANEL BY RT-PCR (FLU A&B, COVID) ARPGX2
Influenza A by PCR: NEGATIVE
Influenza B by PCR: NEGATIVE
SARS Coronavirus 2 by RT PCR: NEGATIVE

## 2021-11-05 LAB — COMPREHENSIVE METABOLIC PANEL
ALT: 21 U/L (ref 0–44)
AST: 34 U/L (ref 15–41)
Albumin: 4 g/dL (ref 3.5–5.0)
Alkaline Phosphatase: 83 U/L (ref 38–126)
Anion gap: 14 (ref 5–15)
BUN: 48 mg/dL — ABNORMAL HIGH (ref 8–23)
CO2: 26 mmol/L (ref 22–32)
Calcium: 8.7 mg/dL — ABNORMAL LOW (ref 8.9–10.3)
Chloride: 89 mmol/L — ABNORMAL LOW (ref 98–111)
Creatinine, Ser: 1.24 mg/dL (ref 0.61–1.24)
GFR, Estimated: 59 mL/min — ABNORMAL LOW (ref >60)
Glucose, Bld: 239 mg/dL — ABNORMAL HIGH (ref 70–99)
Potassium: 4.2 mmol/L (ref 3.5–5.1)
Sodium: 129 mmol/L — ABNORMAL LOW (ref 135–145)
Total Bilirubin: 2.6 mg/dL — ABNORMAL HIGH (ref 0.3–1.2)
Total Protein: 7.1 g/dL (ref 6.5–8.1)

## 2021-11-05 LAB — CBC WITH DIFFERENTIAL/PLATELET
Abs Immature Granulocytes: 0.09 10*3/uL — ABNORMAL HIGH (ref 0.00–0.07)
Basophils Absolute: 0 10*3/uL (ref 0.0–0.1)
Basophils Relative: 0 %
Eosinophils Absolute: 0 10*3/uL (ref 0.0–0.5)
Eosinophils Relative: 0 %
HCT: 44.5 % (ref 39.0–52.0)
Hemoglobin: 14.6 g/dL (ref 13.0–17.0)
Immature Granulocytes: 1 %
Lymphocytes Relative: 5 %
Lymphs Abs: 0.7 10*3/uL (ref 0.7–4.0)
MCH: 28.1 pg (ref 26.0–34.0)
MCHC: 32.8 g/dL (ref 30.0–36.0)
MCV: 85.7 fL (ref 80.0–100.0)
Monocytes Absolute: 0.9 10*3/uL (ref 0.1–1.0)
Monocytes Relative: 6 %
Neutro Abs: 12.3 10*3/uL — ABNORMAL HIGH (ref 1.7–7.7)
Neutrophils Relative %: 88 %
Platelets: 224 10*3/uL (ref 150–400)
RBC: 5.19 MIL/uL (ref 4.22–5.81)
RDW: 14.4 % (ref 11.5–15.5)
WBC: 13.9 10*3/uL — ABNORMAL HIGH (ref 4.0–10.5)
nRBC: 0 % (ref 0.0–0.2)

## 2021-11-05 LAB — CBG MONITORING, ED
Glucose-Capillary: 240 mg/dL — ABNORMAL HIGH (ref 70–99)
Glucose-Capillary: 263 mg/dL — ABNORMAL HIGH (ref 70–99)

## 2021-11-05 LAB — TROPONIN I (HIGH SENSITIVITY): Troponin I (High Sensitivity): 132 ng/L (ref <18)

## 2021-11-05 LAB — BRAIN NATRIURETIC PEPTIDE: B Natriuretic Peptide: 1350.4 pg/mL — ABNORMAL HIGH (ref 0.0–100.0)

## 2021-11-05 MED ORDER — INSULIN ASPART 100 UNIT/ML IJ SOLN
0.0000 [IU] | Freq: Every day | INTRAMUSCULAR | Status: DC
  Administered 2021-11-05 – 2021-11-06 (×2): 3 [IU] via SUBCUTANEOUS
  Filled 2021-11-05 (×2): qty 1

## 2021-11-05 MED ORDER — ACETAMINOPHEN 325 MG PO TABS
650.0000 mg | ORAL_TABLET | Freq: Four times a day (QID) | ORAL | Status: DC | PRN
  Administered 2021-11-06: 650 mg via ORAL
  Filled 2021-11-05: qty 2

## 2021-11-05 MED ORDER — SODIUM CHLORIDE 0.9 % IV BOLUS
500.0000 mL | Freq: Once | INTRAVENOUS | Status: AC
Start: 2021-11-05 — End: 2021-11-05
  Administered 2021-11-05: 500 mL via INTRAVENOUS

## 2021-11-05 MED ORDER — INSULIN ASPART 100 UNIT/ML IJ SOLN
0.0000 [IU] | Freq: Three times a day (TID) | INTRAMUSCULAR | Status: DC
  Administered 2021-11-06 (×2): 3 [IU] via SUBCUTANEOUS
  Administered 2021-11-06 – 2021-11-07 (×4): 5 [IU] via SUBCUTANEOUS
  Administered 2021-11-08: 2 [IU] via SUBCUTANEOUS
  Administered 2021-11-08 (×2): 5 [IU] via SUBCUTANEOUS
  Administered 2021-11-09 (×2): 3 [IU] via SUBCUTANEOUS
  Administered 2021-11-09: 5 [IU] via SUBCUTANEOUS
  Administered 2021-11-10 (×2): 3 [IU] via SUBCUTANEOUS
  Administered 2021-11-10: 5 [IU] via SUBCUTANEOUS
  Administered 2021-11-11 (×2): 3 [IU] via SUBCUTANEOUS
  Administered 2021-11-11: 5 [IU] via SUBCUTANEOUS
  Administered 2021-11-12 – 2021-11-13 (×4): 3 [IU] via SUBCUTANEOUS
  Administered 2021-11-13: 5 [IU] via SUBCUTANEOUS
  Administered 2021-11-14: 3 [IU] via SUBCUTANEOUS
  Filled 2021-11-05 (×24): qty 1

## 2021-11-05 MED ORDER — MIDODRINE HCL 5 MG PO TABS
5.0000 mg | ORAL_TABLET | Freq: Once | ORAL | Status: AC
  Administered 2021-11-05: 5 mg via ORAL
  Filled 2021-11-05: qty 1

## 2021-11-05 MED ORDER — SODIUM CHLORIDE 0.9 % IV SOLN: INTRAVENOUS | Status: DC

## 2021-11-05 MED ORDER — FUROSEMIDE 10 MG/ML IJ SOLN
20.0000 mg | Freq: Once | INTRAMUSCULAR | Status: AC
  Administered 2021-11-05: 20 mg via INTRAVENOUS
  Filled 2021-11-05: qty 4

## 2021-11-05 MED ORDER — FENTANYL CITRATE PF 50 MCG/ML IJ SOSY
50.0000 ug | PREFILLED_SYRINGE | Freq: Once | INTRAMUSCULAR | Status: AC
  Administered 2021-11-05: 50 ug via INTRAVENOUS
  Filled 2021-11-05: qty 1

## 2021-11-05 NOTE — ED Triage Notes (Signed)
Patient had mechanical fall outside and fell on right side. C/o right hip pain. Unable to put weight on right leg. Takes blood thinner daily.

## 2021-11-05 NOTE — ED Notes (Signed)
Resting in bed. Awake and alert. Slightly SOB. Oxygen saturations varying from upper 80's to low 90's with good waveform. Anterior, bilateral lung sounds clear. Placed patient on oxygen per [place] at 2 lpm for comfort and to maintain saturations > 90%. SBP x2 in the 80's. Last BP 103/66 MAP 77. Dr. Vicente Males notified.

## 2021-11-05 NOTE — H&P (Signed)
History and Physical  OBRIEN TRILLING WEX:[id number] DOB: 27-Feb-1941 DOA: 11/05/2021  Referring physician: Alisa Graff, MD PCP: Lauro Regulus, MD  Patient coming from: Home  Chief Complaint: Right hip pain  HPI: Aaron Sosa is a 81 y.o. male with medical history significant for essential hypertension, chronic systolic CHF, type 2 diabetes mellitus, CAD, ischemic cardiomyopathy, hyperlipidemia who presents to the emergency department due to right hip pain after sustaining a mechanical fall from a standing position onto his right side outside his home on a concrete slab.  He immediately complained of right hip pain with inability to sustain weight on the leg, pain was rated as 10/10 on the pain scale and it was nonradiating.  He complained of some back pain and pain in the posterior part of head which was due to an impact when he fell.  He denies knee or ankle pain.  Patient denies fever, chills, chest pain, shortness of breath, blurry vision, numbness or tingling. Pt lives at home alone and is IADL.  He does not use a cane or walker at baseline  ED Course:  In the emergency department, BP was soft at 90/61.  Work-up in the ED showed leukocytosis, hyponatremia, elevated BUN at 48, hyperglycemia.  Influenza A, B, SARS coronavirus 2 was negative. CT of head without contrast showed no acute intracranial abnormalities Right hip x-ray showed subtotal nondisplaced fracture of the inferior right pubic ramus  Review of Systems: Review of systems as noted in the HPI. All other systems reviewed and are negative.   Past Medical History:  Diagnosis Date   Arthritis    CHF (congestive heart failure) (HCC)    Diabetes mellitus without complication (HCC)    HOH (hard of hearing)    Lower extremity edema    Past Surgical History:  Procedure Laterality Date   CATARACT EXTRACTION Left    CATARACT EXTRACTION W/PHACO Right 05/06/2015   Procedure: CATARACT EXTRACTION PHACO AND INTRAOCULAR LENS  PLACEMENT (IOC);  Surgeon: Lia Hopping, MD;  Location: [facility] ORS;  Service: Ophthalmology;  Laterality: Right;  [place] 1:14.7         AP   11.2          CDE   8.43    cassette lot #[id number]   RETINAL DETACHMENT SURGERY     RIGHT/LEFT HEART CATH AND CORONARY ANGIOGRAPHY N/A 03/17/2020   Procedure: RIGHT/LEFT HEART CATH AND CORONARY ANGIOGRAPHY;  Surgeon: Lamar Blinks, MD;  Location: [facility] INVASIVE CV LAB;  Service: Cardiovascular;  Laterality: N/A;   WRIST FRACTURE SURGERY  1958    Social History:  reports that he has quit smoking. His smoking use included cigarettes. He has a 10.00 pack-year smoking history. He has quit using smokeless tobacco. He reports current alcohol use of about 1.0 standard drink per week. He reports that he does not use drugs.   Allergies  Allergen Reactions   Atorvastatin Other (See Comments)    myalgia   Codeine    Dust Mite Extract    Shrimp [Shellfish Allergy]     Family History  Problem Relation Age of Onset   Heart attack Mother    Diabetes Mellitus II Mother    Heart attack Father      Prior to Admission medications   Medication Sig Start Date End Date Taking? Authorizing Provider  acetaminophen (TYLENOL) 500 MG tablet Take 2 tablets (1,000 mg total) by mouth every 8 (eight) hours as needed for mild pain, moderate pain or headache. 03/18/20  Esaw Grandchild A, DO  alum & mag hydroxide-simeth (MAALOX/MYLANTA) 200-200-20 MG/5ML suspension Take 15 mLs by mouth every 6 (six) hours as needed for indigestion or heartburn. 03/18/20   Pennie Banter, DO  aspirin EC 81 MG EC tablet Take 1 tablet (81 mg total) by mouth daily. 03/19/20   Pennie Banter, DO  atorvastatin (LIPITOR) 80 MG tablet Take 1 tablet (80 mg total) by mouth daily. 03/19/20   Pennie Banter, DO  calcium carbonate (OSCAL) 1500 (600 Ca) MG TABS tablet Take 600 mg of elemental calcium by mouth 2 (two) times daily with a meal.    [provider]  calcium carbonate (TUMS - DOSED  IN MG ELEMENTAL CALCIUM) 500 MG chewable tablet Chew 1 tablet (200 mg of elemental calcium total) by mouth 3 (three) times daily as needed for indigestion or heartburn. 03/18/20   Pennie Banter, DO  docusate sodium (COLACE) 100 MG capsule Take 1 capsule (100 mg total) by mouth 2 (two) times daily as needed for mild constipation or moderate constipation. 03/18/20   Esaw Grandchild A, DO  enoxaparin (LOVENOX) 40 MG/0.4ML injection Inject 0.4 mLs (40 mg total) into the skin daily. 03/18/20   Pennie Banter, DO  furosemide (LASIX) 10 MG/ML injection Inject 4 mLs (40 mg total) into the vein 2 (two) times daily. 03/18/20   Pennie Banter, DO  guaiFENesin-dextromethorphan (ROBITUSSIN DM) 100-10 MG/5ML syrup Take 10 mLs by mouth every 6 (six) hours as needed for cough. 03/18/20   Esaw Grandchild A, DO  insulin aspart (NOVOLOG) 100 UNIT/ML injection Inject 0-15 Units into the skin 3 (three) times daily with meals. 03/18/20   Esaw Grandchild A, DO  magnesium 30 MG tablet Take 30 mg by mouth once.    [provider]  metFORMIN (GLUCOPHAGE-XR) 750 MG 24 hr tablet Take 1 tablet (750 mg total) by mouth 2 (two) times daily with a meal. 01/14/20   Karamalegos, Netta Neat, DO  metoprolol tartrate (LOPRESSOR) 25 MG tablet Take 0.5 tablets (12.5 mg total) by mouth 2 (two) times daily. 03/18/20   Pennie Banter, DO  ondansetron (ZOFRAN) 4 MG/2ML SOLN injection Inject 2 mLs (4 mg total) into the vein every 6 (six) hours as needed for nausea, vomiting or refractory nausea / vomiting. 03/18/20   Esaw Grandchild A, DO  ondansetron (ZOFRAN-ODT) 4 MG disintegrating tablet Take 1 tablet (4 mg total) by mouth every 8 (eight) hours as needed for nausea or vomiting. 03/18/20   Pennie Banter, DO  ONETOUCH ULTRA test strip Use to check blood sugar up to 2 x daily 06/29/20   Karamalegos, Netta Neat, DO  polyethylene glycol (MIRALAX / GLYCOLAX) 17 g packet Take 17 g by mouth 2 (two) times daily as needed for mild  constipation or moderate constipation. 03/18/20   Pennie Banter, DO  PROAIR HFA 108 [phone] Base) MCG/ACT inhaler Inhale 2 puffs into the lungs every 4 (four) hours as needed for shortness of breath or wheezing. 03/03/20   [provider]  sodium chloride flush (NS) 0.9 % SOLN Inject 3 mLs into the vein every 12 (twelve) hours. 03/18/20   Pennie Banter, DO    Physical Exam: BP 95/68    Pulse 80    Temp 98.7 F (37.1 C) (Oral)    Resp 16    Ht 5\' 8"  (1.727 m)    Wt 96.6 kg    SpO2 94%    BMI 32.39 kg/m   General: 80  y.o. year-old male well developed well nourished in no acute distress.  Alert and oriented x3. HEENT: Dry mucous membrane.  NCAT, EOMI Neck: Supple, trachea medial Cardiovascular: Regular rate and rhythm with no rubs or gallops.  No thyromegaly or JVD noted.  No lower extremity edema. 2/4 pulses in all 4 extremities. Respiratory: Clear to auscultation with no wheezes or rales. Good inspiratory effort. Abdomen: Soft, nontender nondistended with normal bowel sounds x4 quadrants. Muskuloskeletal: Tender to palpation of right hip. Limited ROM due to pain. +2 edema to the knees. Neuro: CN II-XII intact,  sensation, reflexes intact Skin: No ulcerative lesions noted or rashes Psychiatry: Judgement and insight appear normal. Mood is appropriate for condition and setting          Labs on Admission:  Basic Metabolic Panel: Recent Labs  Lab 11/05/21 1410  NA 129*  K 4.2  CL 89*  CO2 26  GLUCOSE 239*  BUN 48*  CREATININE 1.24  CALCIUM 8.7*   Liver Function Tests: Recent Labs  Lab 11/05/21 1410  AST 34  ALT 21  ALKPHOS 83  BILITOT 2.6*  PROT 7.1  ALBUMIN 4.0   No results for input(s): LIPASE, AMYLASE in the last 168 hours. No results for input(s): AMMONIA in the last 168 hours. CBC: Recent Labs  Lab 11/05/21 1410  WBC 13.9*  NEUTROABS 12.3*  HGB 14.6  HCT 44.5  MCV 85.7  PLT 224   Cardiac Enzymes: No results for input(s): CKTOTAL, CKMB, CKMBINDEX,  TROPONINI in the last 168 hours.  BNP (last 3 results) No results for input(s): BNP in the last 8760 hours.  ProBNP (last 3 results) No results for input(s): PROBNP in the last 8760 hours.  CBG: Recent Labs  Lab 11/05/21 1558  GLUCAP 240*    Radiological Exams on Admission: CT HEAD WO CONTRAST  Result Date: 11/05/2021 CLINICAL DATA:  Minor head trauma.  Fall. EXAM: CT HEAD WITHOUT CONTRAST TECHNIQUE: Contiguous axial images were obtained from the base of the skull through the vertex without intravenous contrast. COMPARISON:  None. FINDINGS: Brain: No subdural, epidural, or subarachnoid hemorrhage identified. Ventricles and sulci are mildly prominent. Cerebellum, brainstem, and basal cisterns are normal. No mass effect or midline shift. No acute cortical ischemia or infarct. Vascular: Calcified atherosclerosis seen in the intracranial carotids. Skull: Normal. Negative for fracture or focal lesion. Sinuses/Orbits: No acute finding. Other: None. IMPRESSION: No acute intracranial abnormalities identified. Electronically Signed   By: Gerome Sam III M.D.   On: 11/05/2021 11:53   DG Hip Unilat  With Pelvis 2-3 Views Right  Result Date: 11/05/2021 CLINICAL DATA:  [place] Seat onto right side. Right hip pain. Unable to bear weight on the right leg. EXAM: DG HIP (WITH OR WITHOUT PELVIS) 2-3V RIGHT COMPARISON:  None. FINDINGS: Subtle nondisplaced fracture of the inferior right pubic ramus is suggested. No other evidence of a fracture.  No bone lesion. Hip joints, SI joints and symphysis pubis are normally aligned. Skeletal structures are diffusely demineralized. There are scattered arterial vascular calcifications. Soft tissues otherwise unremarkable. IMPRESSION: 1. Subtle nondisplaced fracture of the inferior right pubic ramus. No other fractures. No dislocation. Electronically Signed   By: Amie Portland M.D.   On: 11/05/2021 11:10    EKG: I independently viewed the EKG done and my findings are as followed:  EKG was not done in the ED  Assessment/Plan Present on Admission:  Essential hypertension  Obesity (BMI 30.0-34.9)  Active Problems:   Essential hypertension   Type 2 diabetes mellitus (  HCC)   Obesity (BMI 30.0-34.9)   Pubic ramus fracture, right, closed, initial encounter (HCC)   Chronic systolic CHF (congestive heart failure) (HCC)   Leukocytosis   Hyponatremia   Hyperglycemia due to diabetes mellitus (HCC)   Dehydration  Acute nondisplaced fracture of the inferior right pubic ramus Continue Tylenol as needed Continue fall precaution and neurochecks Continue PT/OT eval and treat Orthopedic surgery will be consulted and we shall await further recommendations  Leukocytosis possibly due to reactive process WBC 13.9, continue to monitor WBC  Hyponatremia/dehydration Na 129, this is possibly due to a diuretic effect Continue gentle hydration and continue to monitor sodium levels  Hyperglycemia due to T2DM Continue insulin sliding scale and hypoglycemic protocol  Chronic systolic CHF Beta-blocker will be held at this time due to soft BP Continue Lipitor  Essential hypertension BP meds will be held at this time due to soft BP  Mixed hyperlipidemia Continue Lipitor  Obesity (32.39 kg/m) Patient was counseled on diet and lifestyle modification   DVT prophylaxis: Lovenox  Code Status: Full code  Family Communication: None at bedside  Disposition Plan:  Patient is from:                        home Anticipated DC to:                   SNF or family members home Anticipated DC date:               2-3 days Anticipated DC barriers:         Patient requires inpatient management due to programmers fracture with difficulty to ambulate  Consults called: Orthopedic surgery  Admission status: Inpatient    Frankey Shown MD [facility] Pager [phone]  If 7PM-7AM, please contact night-coverage [email or web address] Password [facility]  11/05/2021, 5:08 PM

## 2021-11-05 NOTE — ED Notes (Signed)
Notified Aaron Schwartz, NP PT has been slightly SOB. Intermittently, has been shivering but has been afebrile. 2L of oxygen to maintain sats above 90%. Soft SBP in the upper 80's at the beginning of my shift. Current BP 95/58 (69).

## 2021-11-05 NOTE — ED Notes (Signed)
RN to bedside. Pt asking for pain. R Hip pain. Pt CAOx4 and in no acute distress.

## 2021-11-05 NOTE — ED Notes (Signed)
Family advised pt is diabetic. Tech assessed BGL at 245.

## 2021-11-05 NOTE — ED Provider Notes (Signed)
[facility] Provider Note    Event Date/Time   First MD Initiated Contact with Patient 11/05/21 1342     (approximate)   History   Fall  HPI Aaron Sosa is a 81 y.o. male with a past medical history of type 2 diabetes, hyperlipidemia, and CHF who presents after a mechanical fall from standing onto his right side onto a concrete slab outside.  Patient states that since that time he has been unable to put weight on this right leg due to severe 10/10 nonradiating pain at this joint.  Patient denies any knee or ankle pain.  However, he does endorse some mild back pain and posterior head pain where he states he fell on the concrete as well.     Physical Exam   Triage Vital Signs: ED Triage Vitals  Enc Vitals Group     BP 11/05/21 1031 90/61     Pulse Rate 11/05/21 1031 88     Resp 11/05/21 1031 16     Temp 11/05/21 1031 98.7 F (37.1 C)     Temp Source 11/05/21 1031 Oral     SpO2 11/05/21 1031 94 %     Weight 11/05/21 1032 213 lb (96.6 kg)     Height 11/05/21 1032 5\' 8"  (1.727 m)     Head Circumference --      Peak Flow --      Pain Score 11/05/21 1032 10     Pain Loc --      Pain Edu? --      Excl. in [place]? --     Most recent vital signs: Vitals:   11/05/21 1031  BP: 90/61  Pulse: 88  Resp: 16  Temp: 98.7 F (37.1 C)  SpO2: 94%    General: Awake, no distress.  CV:  Good peripheral perfusion.  Resp:  Normal effort.  Abd:  No distention.  Other:  Significant tenderness palpation over the right hip joint as well as pain with any range of motion of the right lower extremity   ED Results / Procedures / Treatments   Labs (all labs ordered are listed, but only abnormal results are displayed) Labs Reviewed  RESP PANEL BY RT-PCR (FLU A&B, COVID) [id number]  COMPREHENSIVE METABOLIC PANEL  CBC WITH DIFFERENTIAL/PLATELET   RADIOLOGY ED MD interpretation: CT of the head without contrast shows no evidence of acute abnormalities including no  intracerebral hemorrhage, obvious masses, or significant edema  Three-view x-ray of the right hip shows a subtle nondisplaced fracture of the inferior right pubic ramus  Agree with radiology interpretation  Official radiology report(s): CT HEAD WO CONTRAST  Result Date: 11/05/2021 CLINICAL DATA:  Minor head trauma.  Fall. EXAM: CT HEAD WITHOUT CONTRAST TECHNIQUE: Contiguous axial images were obtained from the base of the skull through the vertex without intravenous contrast. COMPARISON:  None. FINDINGS: Brain: No subdural, epidural, or subarachnoid hemorrhage identified. Ventricles and sulci are mildly prominent. Cerebellum, brainstem, and basal cisterns are normal. No mass effect or midline shift. No acute cortical ischemia or infarct. Vascular: Calcified atherosclerosis seen in the intracranial carotids. Skull: Normal. Negative for fracture or focal lesion. Sinuses/Orbits: No acute finding. Other: None. IMPRESSION: No acute intracranial abnormalities identified. Electronically Signed   By: Dorise Bullion III M.D.   On: 11/05/2021 11:53   DG Hip Unilat  With Pelvis 2-3 Views Right  Result Date: 11/05/2021 CLINICAL DATA:  [place] onto right side. Right hip pain. Unable to bear weight on the right leg.  EXAM: DG HIP (WITH OR WITHOUT PELVIS) 2-3V RIGHT COMPARISON:  None. FINDINGS: Subtle nondisplaced fracture of the inferior right pubic ramus is suggested. No other evidence of a fracture.  No bone lesion. Hip joints, SI joints and symphysis pubis are normally aligned. Skeletal structures are diffusely demineralized. There are scattered arterial vascular calcifications. Soft tissues otherwise unremarkable. IMPRESSION: 1. Subtle nondisplaced fracture of the inferior right pubic ramus. No other fractures. No dislocation. Electronically Signed   By: Lajean Manes M.D.   On: 11/05/2021 11:10      PROCEDURES:  Critical Care performed: No  Procedures   MEDICATIONS ORDERED IN ED: Medications - No data to  display   IMPRESSION / MDM / [place] / ED COURSE  I reviewed the triage vital signs and the nursing notes.                              Patient is a 81 year old male that presents for right hip pain Differential diagnosis includes but is not limited to: Hip fracture, knee fracture, femur fracture, hip dislocation, DVT Workup: XR hip Findings: Right inferior pubic ramus fracture without dislocation Consult: Orthopedic Surgery, hospitalist -Both consultants agree with plan for admission to the hospital service for physical/Occupational Therapy and likely rehab.  Fracture is nonsurgical Patient does not currently demonstrate complications of fracture such as compartment syndrome, arterial or nerve injury.  Interventions: analgesia Disposition: Admit      FINAL CLINICAL IMPRESSION(S) / ED DIAGNOSES   Final diagnoses:  Closed fracture of ramus of right pubis, initial encounter ([place])     Rx / DC Orders   ED Discharge Orders     None        Note:  This document was prepared using Dragon voice recognition software and may include unintentional dictation errors.   Naaman Plummer, MD 11/05/21 [phone]

## 2021-11-06 ENCOUNTER — Inpatient Hospital Stay (HOSPITAL_COMMUNITY)
Admit: 2021-11-06 | Discharge: 2021-11-06 | Disposition: A | Discharge status: Home / Self Care | Payer: Medicare HMO | Attending: Acute Care | Admitting: Acute Care

## 2021-11-06 DIAGNOSIS — R944 Abnormal results of kidney function studies: Secondary | ICD-10-CM | POA: Diagnosis present

## 2021-11-06 DIAGNOSIS — M549 Dorsalgia, unspecified: Secondary | ICD-10-CM | POA: Diagnosis present

## 2021-11-06 DIAGNOSIS — M1990 Unspecified osteoarthritis, unspecified site: Secondary | ICD-10-CM | POA: Diagnosis present

## 2021-11-06 DIAGNOSIS — H9190 Unspecified hearing loss, unspecified ear: Secondary | ICD-10-CM | POA: Diagnosis present

## 2021-11-06 DIAGNOSIS — I10 Essential (primary) hypertension: Secondary | ICD-10-CM | POA: Diagnosis not present

## 2021-11-06 DIAGNOSIS — R778 Other specified abnormalities of plasma proteins: Secondary | ICD-10-CM

## 2021-11-06 DIAGNOSIS — Z91138 Patient's unintentional underdosing of medication regimen for other reason: Secondary | ICD-10-CM | POA: Diagnosis not present

## 2021-11-06 DIAGNOSIS — I110 Hypertensive heart disease with heart failure: Secondary | ICD-10-CM | POA: Diagnosis present

## 2021-11-06 DIAGNOSIS — Z87891 Personal history of nicotine dependence: Secondary | ICD-10-CM | POA: Diagnosis not present

## 2021-11-06 DIAGNOSIS — I2510 Atherosclerotic heart disease of native coronary artery without angina pectoris: Secondary | ICD-10-CM | POA: Diagnosis present

## 2021-11-06 DIAGNOSIS — Y9289 Other specified places as the place of occurrence of the external cause: Secondary | ICD-10-CM | POA: Diagnosis not present

## 2021-11-06 DIAGNOSIS — I5043 Acute on chronic combined systolic (congestive) and diastolic (congestive) heart failure: Secondary | ICD-10-CM

## 2021-11-06 DIAGNOSIS — E782 Mixed hyperlipidemia: Secondary | ICD-10-CM | POA: Diagnosis present

## 2021-11-06 DIAGNOSIS — J9601 Acute respiratory failure with hypoxia: Secondary | ICD-10-CM | POA: Diagnosis not present

## 2021-11-06 DIAGNOSIS — I255 Ischemic cardiomyopathy: Secondary | ICD-10-CM | POA: Diagnosis present

## 2021-11-06 DIAGNOSIS — Z8249 Family history of ischemic heart disease and other diseases of the circulatory system: Secondary | ICD-10-CM | POA: Diagnosis not present

## 2021-11-06 DIAGNOSIS — T501X6A Underdosing of loop [high-ceiling] diuretics, initial encounter: Secondary | ICD-10-CM | POA: Diagnosis present

## 2021-11-06 DIAGNOSIS — E1165 Type 2 diabetes mellitus with hyperglycemia: Secondary | ICD-10-CM | POA: Diagnosis present

## 2021-11-06 DIAGNOSIS — E871 Hypo-osmolality and hyponatremia: Secondary | ICD-10-CM | POA: Diagnosis present

## 2021-11-06 DIAGNOSIS — W1839XA Other fall on same level, initial encounter: Secondary | ICD-10-CM | POA: Diagnosis present

## 2021-11-06 DIAGNOSIS — E669 Obesity, unspecified: Secondary | ICD-10-CM | POA: Diagnosis present

## 2021-11-06 DIAGNOSIS — E860 Dehydration: Secondary | ICD-10-CM | POA: Diagnosis present

## 2021-11-06 DIAGNOSIS — I509 Heart failure, unspecified: Secondary | ICD-10-CM | POA: Diagnosis not present

## 2021-11-06 DIAGNOSIS — W1830XA Fall on same level, unspecified, initial encounter: Secondary | ICD-10-CM | POA: Diagnosis present

## 2021-11-06 DIAGNOSIS — I5022 Chronic systolic (congestive) heart failure: Secondary | ICD-10-CM

## 2021-11-06 DIAGNOSIS — I248 Other forms of acute ischemic heart disease: Secondary | ICD-10-CM | POA: Diagnosis present

## 2021-11-06 DIAGNOSIS — S32591A Other specified fracture of right pubis, initial encounter for closed fracture: Secondary | ICD-10-CM | POA: Diagnosis present

## 2021-11-06 DIAGNOSIS — G3184 Mild cognitive impairment, so stated: Secondary | ICD-10-CM | POA: Diagnosis present

## 2021-11-06 DIAGNOSIS — Z20822 Contact with and (suspected) exposure to covid-19: Secondary | ICD-10-CM | POA: Diagnosis present

## 2021-11-06 DIAGNOSIS — D72828 Other elevated white blood cell count: Secondary | ICD-10-CM | POA: Diagnosis present

## 2021-11-06 DIAGNOSIS — E876 Hypokalemia: Secondary | ICD-10-CM | POA: Diagnosis present

## 2021-11-06 DIAGNOSIS — R0602 Shortness of breath: Secondary | ICD-10-CM | POA: Diagnosis present

## 2021-11-06 LAB — CBG MONITORING, ED
Glucose-Capillary: 184 mg/dL — ABNORMAL HIGH (ref 70–99)
Glucose-Capillary: 202 mg/dL — ABNORMAL HIGH (ref 70–99)
Glucose-Capillary: 197 mg/dL — ABNORMAL HIGH (ref 70–99)
Glucose-Capillary: 267 mg/dL — ABNORMAL HIGH (ref 70–99)

## 2021-11-06 LAB — TROPONIN I (HIGH SENSITIVITY)
Troponin I (High Sensitivity): 200 ng/L (ref <18)
Troponin I (High Sensitivity): 282 ng/L (ref <18)
Troponin I (High Sensitivity): 422 ng/L (ref <18)
Troponin I (High Sensitivity): 465 ng/L (ref <18)
Troponin I (High Sensitivity): 618 ng/L (ref <18)
Troponin I (High Sensitivity): 721 ng/L (ref <18)
Troponin I (High Sensitivity): 676 ng/L (ref <18)

## 2021-11-06 LAB — URINALYSIS, ROUTINE W REFLEX MICROSCOPIC
Bilirubin Urine: NEGATIVE
Glucose, UA: NEGATIVE mg/dL
Hgb urine dipstick: NEGATIVE
Ketones, ur: NEGATIVE mg/dL
Leukocytes,Ua: NEGATIVE
Nitrite: NEGATIVE
Protein, ur: NEGATIVE mg/dL
Specific Gravity, Urine: 1.011 (ref 1.005–1.030)
pH: 5 (ref 5.0–8.0)

## 2021-11-06 LAB — HEMOGLOBIN A1C
Hgb A1c MFr Bld: 10.4 % — ABNORMAL HIGH (ref 4.8–5.6)
Mean Plasma Glucose: 251.78 mg/dL

## 2021-11-06 LAB — ECHOCARDIOGRAM COMPLETE
AR max vel: 2.02 cm2
AV Peak grad: 4.6 mmHg
Ao pk vel: 1.07 m/s
Area-P 1/2: 5.88 cm2
Calc EF: 26.3 %
Height: 68 in
S' Lateral: 4.75 cm
Single Plane A2C EF: 35.5 %
Single Plane A4C EF: 25.4 %
Weight: 3408 oz

## 2021-11-06 MED ORDER — POTASSIUM CHLORIDE CRYS ER 10 MEQ PO TBCR
10.0000 meq | EXTENDED_RELEASE_TABLET | Freq: Two times a day (BID) | ORAL | Status: DC
  Administered 2021-11-06 – 2021-11-08 (×6): 10 meq via ORAL
  Filled 2021-11-06 (×6): qty 1

## 2021-11-06 MED ORDER — ENOXAPARIN SODIUM 60 MG/0.6ML IJ SOSY
0.5000 mg/kg | PREFILLED_SYRINGE | INTRAMUSCULAR | Status: DC
  Administered 2021-11-06 – 2021-11-13 (×8): 47.5 mg via SUBCUTANEOUS
  Filled 2021-11-06 (×8): qty 0.47
  Filled 2021-11-06: qty 0.6

## 2021-11-06 MED ORDER — ACETAMINOPHEN 500 MG PO TABS
1000.0000 mg | ORAL_TABLET | Freq: Three times a day (TID) | ORAL | Status: DC
  Administered 2021-11-06 – 2021-11-14 (×23): 1000 mg via ORAL
  Filled 2021-11-06 (×24): qty 2

## 2021-11-06 MED ORDER — CLOPIDOGREL BISULFATE 75 MG PO TABS
75.0000 mg | ORAL_TABLET | Freq: Every day | ORAL | Status: DC
  Administered 2021-11-06 – 2021-11-14 (×9): 75 mg via ORAL
  Filled 2021-11-06 (×9): qty 1

## 2021-11-06 MED ORDER — FUROSEMIDE 10 MG/ML IJ SOLN
80.0000 mg | Freq: Two times a day (BID) | INTRAMUSCULAR | Status: DC
  Administered 2021-11-06 – 2021-11-08 (×4): 80 mg via INTRAVENOUS
  Filled 2021-11-06 (×4): qty 8

## 2021-11-06 MED ORDER — FUROSEMIDE 10 MG/ML IJ SOLN
40.0000 mg | Freq: Once | INTRAMUSCULAR | Status: AC
  Administered 2021-11-06: 40 mg via INTRAVENOUS
  Filled 2021-11-06: qty 4

## 2021-11-06 MED ORDER — ENOXAPARIN SODIUM 40 MG/0.4ML IJ SOSY: 40.0000 mg | PREFILLED_SYRINGE | INTRAMUSCULAR | Status: DC

## 2021-11-06 MED ORDER — FUROSEMIDE 10 MG/ML IJ SOLN: 40.0000 mg | Freq: Two times a day (BID) | INTRAMUSCULAR | Status: DC

## 2021-11-06 MED ORDER — ROSUVASTATIN CALCIUM 10 MG PO TABS
20.0000 mg | ORAL_TABLET | Freq: Every day | ORAL | Status: DC
  Administered 2021-11-06 – 2021-11-13 (×8): 20 mg via ORAL
  Filled 2021-11-06 (×7): qty 2
  Filled 2021-11-06 (×2): qty 1

## 2021-11-06 MED ORDER — PERFLUTREN LIPID MICROSPHERE
1.0000 mL | INTRAVENOUS | Status: AC | PRN
  Administered 2021-11-06: 4 mL via INTRAVENOUS
  Filled 2021-11-06: qty 10

## 2021-11-06 MED ORDER — ASPIRIN EC 81 MG PO TBEC
81.0000 mg | DELAYED_RELEASE_TABLET | Freq: Every day | ORAL | Status: DC
  Administered 2021-11-06 – 2021-11-14 (×9): 81 mg via ORAL
  Filled 2021-11-06 (×9): qty 1

## 2021-11-06 MED ORDER — OXYCODONE HCL 5 MG PO TABS
2.5000 mg | ORAL_TABLET | Freq: Four times a day (QID) | ORAL | Status: DC | PRN
  Administered 2021-11-08: 2.5 mg via ORAL
  Filled 2021-11-06: qty 1

## 2021-11-06 NOTE — Progress Notes (Signed)
PHARMACIST - PHYSICIAN COMMUNICATION  CONCERNING:  Enoxaparin (Lovenox) for DVT Prophylaxis    RECOMMENDATION: Patient was prescribed enoxaprin 40mg  q24 hours for VTE prophylaxis.   Filed Weights   11/05/21 1032  Weight: 96.6 kg (213 lb)    Body mass index is 32.39 kg/m.  Estimated Creatinine Clearance: 53.6 mL/min (by C-G formula based on SCr of 1.24 mg/dL).   Based on [facility] policy patient is candidate for enoxaparin 0.5mg /kg TBW SQ every 24 hours based on BMI being >30.  DESCRIPTION: Pharmacy has adjusted enoxaparin dose per [facility] policy.  Patient is now receiving enoxaparin 47.5 mg every 24 hours    [facility], PharmD Clinical Pharmacist  11/06/2021 11:03 AM

## 2021-11-06 NOTE — Hospital Course (Signed)
Aaron Sosa is an 81 y.o. M with HTN, sCHF EF 20-25%, DM, and CAD who presented with fall and right hip pain.  In the ER, he had hyponatremia, elevated BUN, BP 90s, severely high BNP and CXR with bilateral opacities.  CT head NAP.  Radiograph of hip showed no hip fracture but right inferior pubic ramus fracture.  Admitted and started on Lasix.

## 2021-11-06 NOTE — Progress Notes (Addendum)
Cross Cover Patient admitted with pubic ramus fracture from fall.  Initial presentation was thought to be dehydration. IV fluids were ordered but not administered due to nurses concern with heart failure.  He had developed some shortness of breath. Chest ray shows vascular congestion.  BNP with significant elevation.  Lasix ordered. Troponin is still trending up. Heparin not started as troponin elevation likely is demand. Repeat EKG. Echo ordered May need cardiology consult BP remains borderline low but MAP above 65  **EKG - SR with 1st degree AVB  - compared to last done here 02/2020 no new. Qtc prolonged at 493 Known prior anterolateral infarct

## 2021-11-06 NOTE — Assessment & Plan Note (Signed)
-   [facility] - Schedule acetaminophen - Low dose PRN oxycodone - PT eval

## 2021-11-06 NOTE — Evaluation (Addendum)
Physical Therapy Evaluation Patient Details Name: Aaron Sosa MRN: [id number] DOB: Jun 27, 1941 Today's Date: 11/06/2021  History of Present Illness  Patient is a 81 year old male with HTN, sCHF EF 20-25%, DM, and CAD who presented with fall and right hip pain. Radiograph of hip showed no hip fracture but right inferior pubic ramus fracture with WBAT recommended. Also found to have acute on chronic combined systolic and diastolic CHF, elevated troponin suspected to be demand ischemia.  Clinical Impression  Patient is agreeable to PT evaluation with encouragement. His daughter was present throughout. At baseline, patient lives at home alone and is independent with ambulation without assistive device. Daughter is only able to assist intermittently at home if needed as she works.  Today patient reports 1/10 pain in right hip with bed mobility. He needs significant assistance for bed mobility with increased time and effort required. He declined standing attempts, despite offering multiple times, due to fear of pain as he had pain earlier with attempting to get to bed side commode. He is not at his baseline level of functional mobility and would not be safe to return home alone at this time. Recommend PT to maximize independence and return to prior level of function. SNF is recommended at this time and recommendation was discussed with both the patient and the daughter who are in agreement.      Recommendations for follow up therapy are one component of a multi-disciplinary discharge planning process, led by the attending physician.  Recommendations may be updated based on patient status, additional functional criteria and insurance authorization.  Follow Up Recommendations Skilled nursing-short term rehab (<3 hours/day)    Assistance Recommended at Discharge Frequent or constant Supervision/Assistance  Patient can return home with the following  A lot of help with walking and/or transfers;A lot of help  with bathing/dressing/bathroom;Help with stairs or ramp for entrance    Equipment Recommendations None recommended by PT  Recommendations for Other Services  OT consult    Functional Status Assessment Patient has had a recent decline in their functional status and demonstrates the ability to make significant improvements in function in a reasonable and predictable amount of time.     Precautions / Restrictions Precautions Precautions: Fall Restrictions Weight Bearing Restrictions: Yes RLE Weight Bearing: Weight bearing as tolerated      Mobility  Bed Mobility Overal bed mobility: Needs Assistance Bed Mobility: Supine to Sit;Sit to Supine     Supine to sit: Max assist Sit to supine: Max assist   General bed mobility comments: assistance for BLE support and trunk support. patient very slow moving with bed mobility and guarding due to fear of pain. patient has the least amount of pain when BLE are moved together. he does only report 1/10 pain with bed mobility today which is a significant improvement compared to earlier attempts per his report    Transfers                   General transfer comment: patient declined standing due to fear of pain despite offering multiple times to assist patient with a rolling walker. explained importance of early mobility and patient is agreeable to attempt next session if his pain seems controlled    Ambulation/Gait                  Stairs            Wheelchair Mobility    Modified Rankin (Stroke Patients Only)  Balance Overall balance assessment: Needs assistance Sitting-balance support: No upper extremity supported;Feet unsupported Sitting balance-Leahy Scale: Good                                       Pertinent Vitals/Pain Pain Assessment: 0-10 Pain Score: 1  Pain Location: right hip Pain Descriptors / Indicators: Discomfort Pain Intervention(s): Limited activity within patient's  tolerance;Monitored during session    Home Living Family/patient expects to be discharged to:: Private residence Living Arrangements: Alone Available Help at Discharge: Family;Available PRN/intermittently Type of Home: House Home Access: Stairs to enter   Entrance Stairs-Number of Steps: 5-6     Home Equipment: Agricultural consultant (2 wheels);Rollator (4 wheels);Cane - single point;Crutches      Prior Function Prior Level of Function : Independent/Modified Independent;History of Falls (last six months) (does not drive)                     Hand Dominance        Extremity/Trunk Assessment   Upper Extremity Assessment Upper Extremity Assessment: Overall WFL for tasks assessed    Lower Extremity Assessment Lower Extremity Assessment: RLE deficits/detail;LLE deficits/detail RLE Deficits / Details: pain with AROM of right hip. RLE: Unable to fully assess due to pain RLE Sensation: WNL LLE Deficits / Details: gurading at times with AROM due to right hip pain with LLE movement. able to perform LAQ in sitting position and activate ankle dorsiflexion/plantarflexion LLE Sensation: WNL       Communication   Communication: No difficulties  Cognition Arousal/Alertness: Awake/alert Behavior During Therapy: WFL for tasks assessed/performed Overall Cognitive Status: Within Functional Limits for tasks assessed                                 General Comments: patient is able to follow all commands without difficulty. patient is [facility]        General Comments General comments (skin integrity, edema, etc.): Sp02 99-100% on 2 L 02 with seated level activity. assisted the nurse and nurse aide with sliding patient from stretcher to hospital bed as he declined to attempt stand pivot transfer    Exercises General Exercises - Lower Extremity Ankle Circles/Pumps: AROM;Strengthening;Both;5 reps;Supine Long Arc Quad: AROM;Strengthening;Left;5 reps (RLE x 2 reps with Aaron Sosa)    Assessment/Plan    PT Assessment Patient needs continued PT services  PT Problem List Decreased strength;Decreased range of motion;Decreased activity tolerance;Decreased balance;Decreased mobility;Pain;Decreased knowledge of use of DME       PT Treatment Interventions DME instruction;Gait training;Stair training;Functional mobility training;Therapeutic activities;Therapeutic exercise;Balance training;Neuromuscular re-education;Patient/family education    PT Goals (Current goals can be found in the Care Plan section)  Acute Rehab PT Goals Patient Stated Goal: to have less pain PT Goal Formulation: With patient/family Time For Goal Achievement: 11/20/21 Potential to Achieve Goals: Fair    Frequency Min 2X/week     Co-evaluation               AM-PAC PT "6 Clicks" Mobility  Outcome Measure Help needed turning from your back to your side while in a flat bed without using bedrails?: A Lot Help needed moving from lying on your back to sitting on the side of a flat bed without using bedrails?: A Lot Help needed moving to and from a bed to a chair (including a wheelchair)?: A Lot Help  needed standing up from a chair using your arms (e.g., wheelchair or bedside chair)?: A Lot Help needed to walk in hospital room?: A Lot Help needed climbing 3-5 steps with a railing? : Total 6 Click Score: 11    End of Session Equipment Utilized During Treatment: Oxygen Activity Tolerance: Patient limited by pain Patient left: in bed;with call bell/phone within reach;with bed alarm set Nurse Communication: Mobility status PT Visit Diagnosis: Pain;Other abnormalities of gait and mobility (R26.89);Difficulty in walking, not elsewhere classified (R26.2) Pain - Right/Left: Right Pain - part of body: Hip    Time: 6720-9470 PT Time Calculation (min) (ACUTE ONLY): 42 min   Charges:   PT Evaluation $PT Eval Moderate Complexity: 1 Mod PT Treatments $Therapeutic Exercise: 8-22 mins $Therapeutic  Activity: 8-22 mins        Donna Bernard, PT, MPT  In[facility] [employer] 11/06/2021, 4:15 PM

## 2021-11-06 NOTE — ED Notes (Signed)
Report received from Jacquelyn, RN °

## 2021-11-06 NOTE — ED Notes (Signed)
Provider aware of BP of 92/60, instructed RN to continue to give IV lasix push

## 2021-11-06 NOTE — Assessment & Plan Note (Signed)
This is new.  Probably asymptomatic.  Hypervolemic. - Stop IV fluids - Fluid restriction - Lasix and trend Na

## 2021-11-06 NOTE — Progress Notes (Signed)
*  PRELIMINARY RESULTS* Echocardiogram 2D Echocardiogram has been performed. Definity IV Contrast used on this study.  Lenor Coffin 11/06/2021, 10:04 AM

## 2021-11-06 NOTE — Assessment & Plan Note (Signed)
BMI 32 

## 2021-11-06 NOTE — Assessment & Plan Note (Signed)
Unclear cause.  Do not suspect infection at this time.

## 2021-11-06 NOTE — Assessment & Plan Note (Signed)
BP soft -Hold Entresto, metoporlol to allow room for diuresis

## 2021-11-06 NOTE — Progress Notes (Signed)
Progress Note   Patient: Aaron Sosa QZE:[id number] DOB: May 07, 1941 DOA: 11/05/2021     0 DOS: the patient was seen and examined on 11/06/2021   Brief hospital course: [name] Sosa is an 81 y.o. M with HTN, sCHF EF 20-25%, DM, and CAD who presented with fall and right hip pain.  In the ER, he had hyponatremia, elevated BUN, BP 90s, severely high BNP and CXR with bilateral opacities.  CT head NAP.  Radiograph of hip showed no hip fracture but right inferior pubic ramus fracture.  Admitted and started on Lasix.   Assessment and Plan * Acute on chronic combined systolic and diastolic CHF (congestive heart failure) (HCC)- (present on admission) Per daughter, this sounds as if it has been developing progressively over 1-2 months, mostly as a result of not taking his diuretics due to not wanting to pee so much.  Here, he reports bad orthopnea, chest pressure with lying flat, he appears to have LE edema, and his CXR shows edema, plus he has a new O2 requirement.  It will be challenging to diurese both because of his low BP, but also because of his cognitive impairment, it will be difficult to know when his diuresis is complete. -Furosemide 40 mg IV twice a day  -K supplement -Strict I/Os, daily weights, telemetry  -Daily monitoring renal function -FOllow echo - Hold Entresto, metoprolol to allow room for diuresis -Hold metolazone, torsemide    Pubic ramus fracture, right, closed, initial encounter (HCC)- (present on admission) - WBAT - Schedule acetaminophen - Low dose PRN oxycodone - PT eval  Elevated troponin- (present on admission) I suspect this is demand ischemia in the setting of fluid overload, CHF.  Not ACS. No ischemic work up at this time. - Trend Trop to peak  Hyponatremia- (present on admission) This is new.  Probably asymptomatic.  Hypervolemic. - Stop IV fluids - Fluid restriction - Lasix and trend Na  Essential hypertension- (present on admission) BP soft -Hold  Entresto, metoporlol to allow room for diuresis  Type 2 diabetes mellitus with hyperglycemia (HCC)- (present on admission) GLucose normal -Continue SS corrections -Hold home glipizide/metformin  Leukocytosis- (present on admission) Unclear cause.  Do not suspect infection at this time.  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  Has Advair on home med list at [facility] office.  Remote former smoker.  No flare here.           Subjective: Patient has continued orthopnea, chest pressure with laying flat, he has mild low swelling in the legs.  He has pain in the right groin.  No exertional chest pain, no chest pain at rest.  No palpitations, confusion.  No fever.  Objective Vital signs reviewed and remarkable for hypoxia which is persistent General appearance: Elderly adult male, lying in bed, very hard of hearing     HEENT: Nasal cannula in place Skin:  Cardiac: RRR, soft systolic murmur, JVP elevated, 1+ lower extremity edema bilaterally, with some brawny venous stasis change. Respiratory: Rales at the left base, no wheezing.  Normal respiratory rate and rhythm. Abdomen:   MSK:  Neuro: Awake and alert, extraocular movements intact, moves  upper extremities with normal strength and crenation, speech fluent very hard of hearing Psych: Psychomotor slowing noted, attention blunted, judgment and insight appear mildly impaired    Data Reviewed:   My review of labs and imaging is notable for elevated troponin, elevated BNP, chest x-ray with bilateral opacities consistent with edema, renal function normal, radiograph  showing.  Ramus fracture.  Family Communication: BedsideDaughter at the bedside  Disposition: Status is: Inpatient  Remains inpatient appropriate because: needs PT eval and IV Lasix.  Likely PT can help the patient get home, although I suspect that he will need 2 more days at least with diuresis           Author: Alberteen Sam, MD 11/06/2021 11:10 AM  For on call review [email or web address].

## 2021-11-06 NOTE — Assessment & Plan Note (Signed)
Per daughter, this sounds as if it has been developing progressively over 1-2 months, mostly as a result of not taking his diuretics due to not wanting to pee so much.  Here, he reports bad orthopnea, chest pressure with lying flat, he appears to have LE edema, and his CXR shows edema, plus he has a new O2 requirement.  It will be challenging to diurese both because of his low BP, but also because of his cognitive impairment, it will be difficult to know when his diuresis is complete. -Furosemide 40 mg IV twice a day  -K supplement -Strict I/Os, daily weights, telemetry  -Daily monitoring renal function -FOllow echo - Hold Entresto, metoprolol to allow room for diuresis -Hold metolazone, torsemide

## 2021-11-06 NOTE — Assessment & Plan Note (Signed)
GLucose normal -Continue SS corrections -Hold home glipizide/metformin

## 2021-11-06 NOTE — Consult Note (Signed)
ORTHOPAEDIC CONSULTATION  REQUESTING PHYSICIAN: Alberteen Sam, *  Chief Complaint:   Right hip/groin pain.  History of Present Illness: Aaron Sosa is a 81 y.o. male with a history of diabetes, congestive heart failure, deafness, and arthritis who lives independently and by himself.  The patient was in his usual state of health until yesterday morning when he went out to meet his daughter for a breakfast engagement when he lost his balance and fell hard on his right side onto the concrete.  His daughter and a neighbor tried to get him up and states that "it took [place] a half hour to get him into the car".  The patient was brought to the emergency room where x-rays demonstrated essentially nondisplaced fractures of the right superior and inferior pubic rami.  The patient denies any associated injuries.  He did not strike his head or lose consciousness.  The patient also denies any lightheadedness, dizziness, chest pain, shortness of breath, or other symptoms which might have precipitated his fall.  Past Medical History:  Diagnosis Date   Arthritis    CHF (congestive heart failure) (HCC)    Diabetes mellitus without complication (HCC)    HOH (hard of hearing)    Lower extremity edema    Past Surgical History:  Procedure Laterality Date   CATARACT EXTRACTION Left    CATARACT EXTRACTION W/PHACO Right 05/06/2015   Procedure: CATARACT EXTRACTION PHACO AND INTRAOCULAR LENS PLACEMENT (IOC);  Surgeon: Lia Hopping, MD;  Location: [facility] ORS;  Service: Ophthalmology;  Laterality: Right;  [place] 1:14.7         AP   11.2          CDE   8.43    cassette lot #[id number]   RETINAL DETACHMENT SURGERY     RIGHT/LEFT HEART CATH AND CORONARY ANGIOGRAPHY N/A 03/17/2020   Procedure: RIGHT/LEFT HEART CATH AND CORONARY ANGIOGRAPHY;  Surgeon: Lamar Blinks, MD;  Location: [facility] INVASIVE CV [facility];  Service: Cardiovascular;  Laterality: N/A;   WRIST  FRACTURE SURGERY  1958   Social History   Socioeconomic History   Marital status: Widowed    Spouse name: Not on file   Number of children: Not on file   Years of education: High School   Highest education level: High school graduate  Occupational History   Not on file  Tobacco Use   Smoking status: Former    Packs/day: 2.00    Years: 5.00    Pack years: 10.00    Types: Cigarettes   Smokeless tobacco: Former  Building services engineer Use: Never used  Substance and Sexual Activity   Alcohol use: Yes    Alcohol/week: 1.0 standard drink    Types: 1 Cans of beer per week   Drug use: Never   Sexual activity: Not on file  Other Topics Concern   Not on file  Social History Narrative   Not on file   Social Determinants of Health   Financial Resource Strain: Not on file  Food Insecurity: Not on file  Transportation Needs: Not on file  Physical Activity: Not on file  Stress: Not on file  Social Connections: Not on file   Family History  Problem Relation Age of Onset   Heart attack Mother    Diabetes Mellitus II Mother    Heart attack Father    Allergies  Allergen Reactions   Empagliflozin Other (See Comments)   Prednisone Other (See Comments) and Swelling    Fluid build up  Shrimp Extract Allergy Skin Test Anaphylaxis   Atorvastatin Other (See Comments)    myalgia Other reaction(s): Unknown myalgia   Codeine     Other reaction(s): Unknown Makes him feel crazy   Dust Mite Extract    Shrimp [Shellfish Allergy]    Prior to Admission medications   Medication Sig Start Date End Date Taking? Authorizing Provider  acetaminophen (TYLENOL) 500 MG tablet Take 2 tablets (1,000 mg total) by mouth every 8 (eight) hours as needed for mild pain, moderate pain or headache. 03/18/20  Yes Nicole Kindred A, DO  aspirin EC 81 MG EC tablet Take 1 tablet (81 mg total) by mouth daily. 03/19/20  Yes Nicole Kindred A, DO  calcium carbonate (TUMS - DOSED IN MG ELEMENTAL CALCIUM) 500 MG  chewable tablet Chew 1 tablet (200 mg of elemental calcium total) by mouth 3 (three) times daily as needed for indigestion or heartburn. 03/18/20  Yes Nicole Kindred A, DO  clopidogrel (PLAVIX) 75 MG tablet Take 75 mg by mouth daily. 07/08/21  Yes [provider]  docusate sodium (COLACE) 100 MG capsule Take 1 capsule (100 mg total) by mouth 2 (two) times daily as needed for mild constipation or moderate constipation. 03/18/20  Yes Nicole Kindred A, DO  ENTRESTO 24-26 MG Take 1 tablet by mouth 2 (two) times daily. 09/12/21  Yes [provider]  glipiZIDE (GLUCOTROL XL) 10 MG 24 hr tablet Take 10 mg by mouth daily. 10/15/21  Yes [provider]  magnesium oxide (MAG-OX) 400 MG tablet Take 400 mg by mouth 2 (two) times daily.   Yes [provider]  metFORMIN (GLUCOPHAGE-XR) 500 MG 24 hr tablet Take 2,000 mg by mouth daily with supper. With dinner 05/10/21 05/10/22 Yes [provider]  metolazone (ZAROXOLYN) 5 MG tablet Take 5 mg by mouth as directed. Take 1 tablet (5 mg total) by mouth every Tuesday, Thursday, Saturday, and Sunday 08/25/21  Yes [provider]  metoprolol succinate (TOPROL-XL) 25 MG 24 hr tablet Take 25 mg by mouth daily. 10/28/21 10/28/22 Yes [provider]  Multiple Vitamins-Minerals (PRESERVISION AREDS 2 PO) Take 1 tablet by mouth 2 (two) times daily.   Yes [provider]  nitroGLYCERIN (NITROSTAT) 0.4 MG SL tablet Place under the tongue. 03/27/20  Yes [provider]  polyethylene glycol powder (GLYCOLAX/MIRALAX) 17 GM/SCOOP powder Take 17 g by mouth daily as needed. Take 1 packet (17 g total) by mouth once daily as needed for Constipation Mix in 4-8ounces of fluid prior to taking. 03/27/20  Yes [provider]  rosuvastatin (CRESTOR) 20 MG tablet Take 20 mg by mouth at bedtime. 10/27/21  Yes [provider]  senna-docusate (SENOKOT-S) 8.6-50 MG tablet Take 2 tablets by mouth 2 (two) times  daily as needed for constipation. 03/27/20  Yes [provider]  torsemide (DEMADEX) 100 MG tablet Take 100 mg by mouth daily. 10/28/21  Yes [provider]  acetaminophen (TYLENOL) 650 MG CR tablet Take 650 mg by mouth 2 (two) times daily as needed for pain. Patient not taking: Reported on 11/06/2021    [provider]  alum & mag hydroxide-simeth (MAALOX/MYLANTA) 200-200-20 MG/5ML suspension Take 15 mLs by mouth every 6 (six) hours as needed for indigestion or heartburn. Patient not taking: Reported on 11/06/2021 03/18/20   Nicole Kindred A, DO  furosemide (LASIX) 10 MG/ML injection Inject 4 mLs (40 mg total) into the vein 2 (two) times daily. Patient not taking: Reported on 11/06/2021 03/18/20   Nicole Kindred  A, DO  metoprolol tartrate (LOPRESSOR) 25 MG tablet Take 0.5 tablets (12.5 mg total) by mouth 2 (two) times daily. Patient not taking: Reported on 11/06/2021 03/18/20   Ezekiel Slocumb, DO  [facility] ULTRA test strip Use to check blood sugar up to 2 x daily 06/29/20   Parks Ranger, Devonne Doughty, DO  PROAIR HFA 108 [phone] Base) MCG/ACT inhaler Inhale 2 puffs into the lungs every 4 (four) hours as needed for shortness of breath or wheezing. 03/03/20   [provider]   CT HEAD WO CONTRAST  Result Date: 11/05/2021 CLINICAL DATA:  Minor head trauma.  Fall. EXAM: CT HEAD WITHOUT CONTRAST TECHNIQUE: Contiguous axial images were obtained from the base of the skull through the vertex without intravenous contrast. COMPARISON:  None. FINDINGS: Brain: No subdural, epidural, or subarachnoid hemorrhage identified. Ventricles and sulci are mildly prominent. Cerebellum, brainstem, and basal cisterns are normal. No mass effect or midline shift. No acute cortical ischemia or infarct. Vascular: Calcified atherosclerosis seen in the intracranial carotids. Skull: Normal. Negative for fracture or focal lesion. Sinuses/Orbits: No acute finding. Other: None. IMPRESSION: No acute intracranial  abnormalities identified. Electronically Signed   By: Dorise Bullion III M.D.   On: 11/05/2021 11:53   DG Chest Port 1 View  Result Date: 11/05/2021 CLINICAL DATA:  Hypoxia. EXAM: PORTABLE CHEST 1 VIEW COMPARISON:  Chest radiograph dated 03/16/2020. FINDINGS: Shallow inspiration. No focal consolidation, pleural effusion or pneumothorax. There is cardiomegaly with vascular congestion and possible mild edema. No acute osseous pathology. IMPRESSION: Cardiomegaly with vascular congestion and possible mild edema. No focal consolidation. Electronically Signed   By: Anner Crete M.D.   On: 11/05/2021 22:10   ECHOCARDIOGRAM COMPLETE  Result Date: 11/06/2021    ECHOCARDIOGRAM REPORT   Patient Name:   Aaron Sosa Date of Exam: 11/06/2021 Medical Rec #:  [id number]     Height:       68.0 in Accession #:    [id number]    Weight:       213.0 lb Date of Birth:  1941/05/26      BSA:          2.099 m Patient Age:    49 years      BP:           88/59 mmHg Patient Gender: M             HR:           85 bpm. Exam Location:  [facility] Procedure: 2D Echo and Intracardiac Opacification Agent Indications:     CHF I50.9  History:         Patient has prior history of Echocardiogram examinations, most                  recent 03/17/2020.  Sonographer:     Kathlen Brunswick RDCS Referring Phys:  [id number] [place] Diagnosing Phys: Ida Rogue MD  Sonographer Comments: Technically difficult study due to poor echo windows and suboptimal apical window. IMPRESSIONS  1. Left ventricular ejection fraction, by estimation, is 25 to 30%. The left ventricle has severely decreased function. The left ventricle demonstrates global hypokinesis with severe hypokinesis of the anerior/anteroseptal and apical region. Left ventricular diastolic parameters are consistent with Grade II diastolic dysfunction (pseudonormalization).  2. Right ventricular systolic function is moderately reduced. The right ventricular size is mildly enlarged. Tricuspid  regurgitation signal is inadequate for assessing PA pressure.  3. Left atrial size was moderately dilated.  4. Right atrial size was  moderately dilated.  5. The mitral valve is normal in structure. Mild to moderate mitral valve regurgitation. No evidence of mitral stenosis.  6. The aortic valve is normal in structure. Aortic valve regurgitation is not visualized. Aortic valve sclerosis/calcification is present, without any evidence of aortic stenosis. FINDINGS  Left Ventricle: Left ventricular ejection fraction, by estimation, is 25 to 30%. The left ventricle has severely decreased function. The left ventricle demonstrates global hypokinesis. Definity contrast agent was given IV to delineate the left ventricular endocardial borders. The left ventricular internal cavity size was normal in size. There is no left ventricular hypertrophy. Left ventricular diastolic parameters are consistent with Grade II diastolic dysfunction (pseudonormalization). Right Ventricle: The right ventricular size is mildly enlarged. No increase in right ventricular wall thickness. Right ventricular systolic function is moderately reduced. Tricuspid regurgitation signal is inadequate for assessing PA pressure. Left Atrium: Left atrial size was moderately dilated. Right Atrium: Right atrial size was moderately dilated. Pericardium: There is no evidence of pericardial effusion. Mitral Valve: The mitral valve is normal in structure. Mild to moderate mitral valve regurgitation. No evidence of mitral valve stenosis. Tricuspid Valve: The tricuspid valve is normal in structure. Tricuspid valve regurgitation is not demonstrated. No evidence of tricuspid stenosis. Aortic Valve: The aortic valve is normal in structure. Aortic valve regurgitation is not visualized. Aortic valve sclerosis/calcification is present, without any evidence of aortic stenosis. Aortic valve peak gradient measures 4.6 mmHg. Pulmonic Valve: The pulmonic valve was normal in  structure. Pulmonic valve regurgitation is not visualized. No evidence of pulmonic stenosis. Aorta: The aortic root is normal in size and structure. Venous: The pulmonary veins were not well visualized. The inferior vena cava was not well visualized. IAS/Shunts: No atrial level shunt detected by color flow Doppler.  LEFT VENTRICLE PLAX 2D LVIDd:         5.50 cm      Diastology LVIDs:         4.75 cm      LV e' medial:    6.85 cm/s LV PW:         1.15 cm      LV E/e' medial:  12.4 LV IVS:        1.15 cm      LV e' lateral:   8.70 cm/s LVOT diam:     2.20 cm      LV E/e' lateral: 9.7 LV SV:         37 LV SV Index:   18 LVOT Area:     3.80 cm  LV Volumes (MOD) LV vol d, MOD A2C: 137.0 ml LV vol d, MOD A4C: 209.0 ml LV vol s, MOD A2C: 88.3 ml LV vol s, MOD A4C: 156.0 ml LV SV MOD A2C:     48.7 ml LV SV MOD A4C:     209.0 ml LV SV MOD BP:      44.8 ml RIGHT VENTRICLE RV Basal diam:  4.40 cm RV S prime:     11.30 cm/s TAPSE (M-mode): 1.6 cm LEFT ATRIUM             Index        RIGHT ATRIUM           Index LA diam:        4.80 cm 2.29 cm/m   RA Area:     30.00 cm LA Vol (A2C):   43.3 ml 20.63 ml/m  RA Volume:   115.00 ml 54.78 ml/m LA Vol (A4C):  82.0 ml 39.06 ml/m LA Biplane Vol: 61.4 ml 29.25 ml/m  AORTIC VALVE                 PULMONIC VALVE AV Area (Vmax): 2.02 cm     PV Vmax:       0.71 m/s AV Vmax:        107.00 cm/s  PV Peak grad:  2.0 mmHg AV Peak Grad:   4.6 mmHg LVOT Vmax:      56.80 cm/s LVOT Vmean:     36.900 cm/s LVOT VTI:       0.098 m  AORTA Ao Root diam: 3.40 cm Ao Asc diam:  3.30 cm MITRAL VALVE MV Area (PHT): 5.88 cm    SHUNTS MV Decel Time: 129 msec    Systemic VTI:  0.10 m MV E velocity: 84.80 cm/s  Systemic Diam: 2.20 cm MV A velocity: 58.70 cm/s MV E/A ratio:  1.44 Ida Rogue MD Electronically signed by Ida Rogue MD Signature Date/Time: 11/06/2021/11:34:03 AM    Final    DG Hip Unilat  With Pelvis 2-3 Views Right  Result Date: 11/05/2021 CLINICAL DATA:  [place] onto right side. Right  hip pain. Unable to bear weight on the right leg. EXAM: DG HIP (WITH OR WITHOUT PELVIS) 2-3V RIGHT COMPARISON:  None. FINDINGS: Subtle nondisplaced fracture of the inferior right pubic ramus is suggested. No other evidence of a fracture.  No bone lesion. Hip joints, SI joints and symphysis pubis are normally aligned. Skeletal structures are diffusely demineralized. There are scattered arterial vascular calcifications. Soft tissues otherwise unremarkable. IMPRESSION: 1. Subtle nondisplaced fracture of the inferior right pubic ramus. No other fractures. No dislocation. Electronically Signed   By: Lajean Manes M.D.   On: 11/05/2021 11:10    Positive ROS: All other systems have been reviewed and were otherwise negative with the exception of those mentioned in the HPI and as above.  Physical Exam: General:  Alert, no acute distress Psychiatric:  Patient is competent for consent with normal mood and affect   Cardiovascular:  No pedal edema Respiratory:  No wheezing, non-labored breathing GI:  Abdomen is soft and non-tender Skin:  No lesions in the area of chief complaint Neurologic:  Sensation intact distally Lymphatic:  No axillary or cervical lymphadenopathy  Orthopedic Exam:  Orthopedic examination is limited to the right hip and lower extremity.  The right lower extremity is aligned symmetrically to the left lower extremity.  Skin inspection around the right hip is unremarkable.  No swelling, erythema, ecchymosis, abrasions, or other skin abnormalities are identified.  He has moderate tenderness to palpation over the anterior aspect of the right hip.  This pain is reproduced with attempted active hip flexion, but he is able to tolerate gentle logrolling with only minimal discomfort.  He is neurovascularly intact to the right lower extremity and foot.  X-rays:  X-rays of the pelvis and right hip are available for review and have been reviewed by myself.  These films demonstrate minimally displaced  right superior and inferior pubic rami fractures.  No significant degenerative changes of the right hip are noted.  No lytic lesions or other acute bony abnormalities are identified.  Assessment: Essentially nondisplaced right superior and inferior pubic rami fractures.  Plan: The treatment options have been discussed with the patient and her daughter, who is at the bedside.  The patient and her daughter are reassured that this injury does not require surgical intervention.  The patient may begin to be mobilized with  physical therapy as symptoms permit, weightbearing as tolerated on the right lower extremity and using a walker for balance and support.  He may receive pain medication as deemed appropriate medically as necessary for comfort.  Most likely, he will require rehab placement for a period of time.  Thank you for asking me to participate in the care of this most pleasant yet unfortunate man.  I will be happy to follow him with you.   Pascal Lux, MD  Beeper #:  [phone]  11/06/2021 4:01 PM

## 2021-11-06 NOTE — ED Notes (Signed)
Pt pulled off primofit catheter and urinated in bed.  Pt bedding changed and new primofit placed

## 2021-11-06 NOTE — ED Notes (Signed)
[name] NP, updated on current BP is 80/47 map 58. Resulted Troponin 132 and BNP 1350. Advised by Jon Billings to monitor BP closely and notify her if SBP < 80 or MAP < 50.

## 2021-11-06 NOTE — ED Notes (Signed)
Pt moved from stretcher onto hospital bed. Pt unable to stand and pivot so RN, PT and NT slid pt across

## 2021-11-06 NOTE — ED Notes (Signed)
Pt placed on bed pan for BM. Pt has not urinated since given IV lasix, denies feeling like he has to go.   Pt bedding replaced and clean chucks under pt after cleaned and removed bedpan

## 2021-11-06 NOTE — Assessment & Plan Note (Signed)
I suspect this is demand ischemia in the setting of fluid overload, CHF.  Not ACS. No ischemic work up at this time. - Trend Trop to peak

## 2021-11-06 NOTE — ED Notes (Signed)
[name], NP updated on second troponin result of 200.

## 2021-11-06 NOTE — ED Notes (Signed)
Awake and alert. Tylenol given for pain. Offered repositioning in bed, pt declined. Afraid of pain and if it will help relieve pain. Some relief with his SOB. Continues on O2 per [place] titrated down from 2 lpm to 1 lpm. Oxygen saturations in the high 90's.

## 2021-11-07 DIAGNOSIS — Z794 Long term (current) use of insulin: Secondary | ICD-10-CM

## 2021-11-07 LAB — CBC
HCT: 41.7 % (ref 39.0–52.0)
Hemoglobin: 13.5 g/dL (ref 13.0–17.0)
MCH: 27.3 pg (ref 26.0–34.0)
MCHC: 32.4 g/dL (ref 30.0–36.0)
MCV: 84.4 fL (ref 80.0–100.0)
Platelets: 171 10*3/uL (ref 150–400)
RBC: 4.94 MIL/uL (ref 4.22–5.81)
RDW: 14.6 % (ref 11.5–15.5)
WBC: 8.4 10*3/uL (ref 4.0–10.5)
nRBC: 0 % (ref 0.0–0.2)

## 2021-11-07 LAB — BASIC METABOLIC PANEL
Anion gap: 12 (ref 5–15)
BUN: 49 mg/dL — ABNORMAL HIGH (ref 8–23)
CO2: 28 mmol/L (ref 22–32)
Calcium: 8.2 mg/dL — ABNORMAL LOW (ref 8.9–10.3)
Chloride: 91 mmol/L — ABNORMAL LOW (ref 98–111)
Creatinine, Ser: 1.23 mg/dL (ref 0.61–1.24)
GFR, Estimated: 59 mL/min — ABNORMAL LOW (ref >60)
Glucose, Bld: 212 mg/dL — ABNORMAL HIGH (ref 70–99)
Potassium: 3.1 mmol/L — ABNORMAL LOW (ref 3.5–5.1)
Sodium: 131 mmol/L — ABNORMAL LOW (ref 135–145)

## 2021-11-07 LAB — CBG MONITORING, ED
Glucose-Capillary: 214 mg/dL — ABNORMAL HIGH (ref 70–99)
Glucose-Capillary: 299 mg/dL — ABNORMAL HIGH (ref 70–99)
Glucose-Capillary: 225 mg/dL — ABNORMAL HIGH (ref 70–99)

## 2021-11-07 MED ORDER — INSULIN GLARGINE-YFGN 100 UNIT/ML ~~LOC~~ SOLN
10.0000 [IU] | Freq: Every day | SUBCUTANEOUS | Status: DC
  Administered 2021-11-07 – 2021-11-08 (×2): 10 [IU] via SUBCUTANEOUS
  Filled 2021-11-07 (×3): qty 0.1

## 2021-11-07 NOTE — Progress Notes (Signed)
PROGRESS NOTE  Aaron Sosa    DOB: 1941-07-30, 81 y.o.  [id number]  PCP: Kirk Ruths, MD   Code Status: Prior   DOA: 11/05/2021   LOS: 1  Brief Narrative of Current Hospitalization  Aaron Sosa is a 81 y.o. 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 They presented from home to the ED on 11/05/2021 with right hip subtotal nondisplaced fracture of superior and inferior right pubic rami after mechanical fall. In the ED, they were treated with analgesia as needed.  Ortho surgery was consulted and recommended nonsurgical management.  Patient was admitted to medicine service for further workup and management of hip fracture as outlined in detail below. Additionally, patient had suspected heart failure exacerbation new O2 requirement and hypokalemia on exam.  History reveals this is likely due to nonadherence with diuretic therapy.  He was treated with IV diuretics and home medications.  11/07/21 -stable, ORA. Medically ready for dc to SNF when bed available.   Assessment & Plan  Principal Problem:   Acute on chronic combined systolic and diastolic CHF (congestive heart failure) (HCC) Active Problems:   Essential hypertension   Obesity (BMI 30.0-34.9)   Pubic ramus fracture, right, closed, initial encounter ([place])   Leukocytosis   Hyponatremia   Type 2 diabetes mellitus with hyperglycemia (HCC)   Elevated troponin  Acute hypoxic respiratory failure 2/2 HrEF exacerbation- (resolved) likely secondary to non-adherence to home therapies. Responded well to IV diuresis and is now ORA without respiratory complaint. Bps remain soft so home therapies continue to hold and can be added on as tolerated. -Furosemide 40 mg IV daily -K supplement -Strict I/Os, daily weights, telemetry  -Daily monitoring renal function - Hold Entresto, metoprolol to allow room for diuresis -Hold metolazone, torsemide   Pubic ramus fracture, right,  closed, initial encounter ([place])- (present on admission) - WBAT - Schedule acetaminophen - Low dose PRN oxycodone - PT eval,   Elevated troponin- (present on admission) I suspect this is demand ischemia in the setting of fluid overload, CHF. Not ACS. No ischemic work up at this time. - Trend Trop to peak   Hyponatremia- (present on admission)- improving Na+ 129>131 - Fluid restriction - Lasix and trend Na   Essential hypertension- (present on admission) BP soft -Hold Entresto, metoprolol to allow room for diuresis   Type 2 diabetes mellitus with hyperglycemia ([place])- (present on admission)- bg elevated 214 fasting -Continue SS corrections - add semglee 10units daily and titrate up as needed -Hold home glipizide/metformin   Obesity (BMI 30.0-34.9)- (present on admission) BMI 32   Probably mild cognitive impairment - Recommend outpatient evaluation  DVT prophylaxis: lovenox  Diet:  Diet Orders (From admission, onward)     Start     Ordered   11/06/21 1108  Diet heart healthy/carb modified Room service appropriate? Yes; Fluid consistency: Thin; Fluid restriction: 1500 mL Fluid  Diet effective now       Question Answer Comment  Diet-HS Snack? Nothing   Room service appropriate? Yes   Fluid consistency: Thin   Fluid restriction: 1500 mL Fluid      11/06/21 1107            Subjective 11/07/21    Pt reports no complaints at this time. His pain is moderately well controlled with medications. Has no respiratory distress.   Disposition Plan & Communication  Patient status: Inpatient  Admitted From: Home Disposition: Skilled nursing facility Anticipated discharge  date: TBD, patient is medically stable for dc when bed available  Family Communication: none  Consults, Procedures, Significant Events  Consultants:  Ortho surgery  Procedures/significant events:  None  Antimicrobials:  Anti-infectives (From admission, onward)    None       Objective   Vitals:    11/07/21 0400 11/07/21 0430 11/07/21 0500 11/07/21 0600  BP: 98/62 97/60 (!) 87/56 (!) 102/56  Pulse: 86 86 87 85  Resp: 20 14 19 19   Temp:      TempSrc:      SpO2: 94% 94% 94% 90%  Weight:      Height:        Intake/Output Summary (Last 24 hours) at 11/07/2021 0631 Last data filed at 11/06/2021 2145 Gross per 24 hour  Intake --  Output 1600 ml  Net -1600 ml   Filed Weights   11/05/21 1032  Weight: 96.6 kg    Patient BMI: Body mass index is 32.39 kg/m.   Physical Exam:  General: awake, alert, NAD HEENT: atraumatic, clear conjunctiva, anicteric sclera, MMM, hearing grossly normal Respiratory: normal respiratory effort. Cardiovascular: normal S1/S2, RRR, no JVD, murmurs, quick capillary refill  Gastrointestinal: soft, NT, ND Nervous: A&O x3. no gross focal neurologic deficits, normal speech Extremities: moves all equally, no edema, normal tone Skin: dry, intact, normal temperature, normal color. No rashes, lesions or ulcers on exposed skin Psychiatry: normal mood, congruent affect  Labs   I have personally reviewed following labs and imaging studies Admission on 11/05/2021  Component Date Value Ref Range Status   Sodium 11/05/2021 129 (L)  135 - 145 mmol/L Final   Potassium 11/05/2021 4.2  3.5 - 5.1 mmol/L Final   Chloride 11/05/2021 89 (L)  98 - 111 mmol/L Final   CO2 11/05/2021 26  22 - 32 mmol/L Final   Glucose, Bld 11/05/2021 239 (H)  70 - 99 mg/dL Final   BUN 11/05/2021 48 (H)  8 - 23 mg/dL Final   Creatinine, Ser 11/05/2021 1.24  0.61 - 1.24 mg/dL Final   Calcium 11/05/2021 8.7 (L)  8.9 - 10.3 mg/dL Final   Total Protein 11/05/2021 7.1  6.5 - 8.1 g/dL Final   Albumin 11/05/2021 4.0  3.5 - 5.0 g/dL Final   AST 11/05/2021 34  15 - 41 U/L Final   ALT 11/05/2021 21  0 - 44 U/L Final   Alkaline Phosphatase 11/05/2021 83  38 - 126 U/L Final   Total Bilirubin 11/05/2021 2.6 (H)  0.3 - 1.2 mg/dL Final   GFR, Estimated 11/05/2021 59 (L)  >60 mL/min Final   Anion gap  11/05/2021 14  5 - 15 Final   WBC 11/05/2021 13.9 (H)  4.0 - 10.5 K/uL Final   RBC 11/05/2021 5.19  4.22 - 5.81 MIL/uL Final   Hemoglobin 11/05/2021 14.6  13.0 - 17.0 g/dL Final   HCT 11/05/2021 44.5  39.0 - 52.0 % Final   MCV 11/05/2021 85.7  80.0 - 100.0 fL Final   MCH 11/05/2021 28.1  26.0 - 34.0 pg Final   MCHC 11/05/2021 32.8  30.0 - 36.0 g/dL Final   RDW 11/05/2021 14.4  11.5 - 15.5 % Final   Platelets 11/05/2021 224  150 - 400 K/uL Final   nRBC 11/05/2021 0.0  0.0 - 0.2 % Final   Neutrophils Relative % 11/05/2021 88  % Final   Neutro Abs 11/05/2021 12.3 (H)  1.7 - 7.7 K/uL Final   Lymphocytes Relative 11/05/2021 5  % Final   Lymphs  Abs 11/05/2021 0.7  0.7 - 4.0 K/uL Final   Monocytes Relative 11/05/2021 6  % Final   Monocytes Absolute 11/05/2021 0.9  0.1 - 1.0 K/uL Final   Eosinophils Relative 11/05/2021 0  % Final   Eosinophils Absolute 11/05/2021 0.0  0.0 - 0.5 K/uL Final   Basophils Relative 11/05/2021 0  % Final   Basophils Absolute 11/05/2021 0.0  0.0 - 0.1 K/uL Final   Immature Granulocytes 11/05/2021 1  % Final   Abs Immature Granulocytes 11/05/2021 0.09 (H)  0.00 - 0.07 K/uL Final   SARS Coronavirus 2 by RT PCR 11/05/2021 NEGATIVE  NEGATIVE Final   Influenza A by PCR 11/05/2021 NEGATIVE  NEGATIVE Final   Influenza B by PCR 11/05/2021 NEGATIVE  NEGATIVE Final   Glucose-Capillary 11/05/2021 240 (H)  70 - 99 mg/dL Final   Hgb A1c MFr Bld 11/05/2021 10.4 (H)  4.8 - 5.6 % Final   Mean Plasma Glucose 11/05/2021 251.78  mg/dL Final   Color, Urine 11/05/2021 YELLOW (A)  YELLOW Final   APPearance 11/05/2021 CLEAR (A)  CLEAR Final   Specific Gravity, Urine 11/05/2021 1.011  1.005 - 1.030 Final   pH 11/05/2021 5.0  5.0 - 8.0 Final   Glucose, UA 11/05/2021 NEGATIVE  NEGATIVE mg/dL Final   Hgb urine dipstick 11/05/2021 NEGATIVE  NEGATIVE Final   Bilirubin Urine 11/05/2021 NEGATIVE  NEGATIVE Final   Ketones, ur 11/05/2021 NEGATIVE  NEGATIVE mg/dL Final   Protein, ur 11/05/2021  NEGATIVE  NEGATIVE mg/dL Final   Nitrite 11/05/2021 NEGATIVE  NEGATIVE Final   Leukocytes,Ua 11/05/2021 NEGATIVE  NEGATIVE Final   Glucose-Capillary 11/05/2021 263 (H)  70 - 99 mg/dL Final   Troponin I (High Sensitivity) 11/05/2021 132 (HH)  <18 ng/L Final   B Natriuretic Peptide 11/05/2021 1,350.4 (H)  0.0 - 100.0 pg/mL Final   Troponin I (High Sensitivity) 11/06/2021 200 (HH)  <18 ng/L Final   Troponin I (High Sensitivity) 11/06/2021 282 (HH)  <18 ng/L Final   Troponin I (High Sensitivity) 11/06/2021 422 (HH)  <18 ng/L Final   Weight 11/06/2021 3,408  oz Final   Height 11/06/2021 68  in Final   BP 11/06/2021 88/59  mmHg Final   Ao pk vel 11/06/2021 1.07  m/s Final   AR max vel 11/06/2021 2.02  cm2 Final   AV Peak grad 11/06/2021 4.6  mmHg Final   Single Plane A2C EF 11/06/2021 35.5  % Final   Single Plane A4C EF 11/06/2021 25.4  % Final   Calc EF 11/06/2021 26.3  % Final   S' Lateral 11/06/2021 4.75  cm Final   Area-P 1/2 11/06/2021 5.88  cm2 Final   Troponin I (High Sensitivity) 11/06/2021 465 (HH)  <18 ng/L Final   Troponin I (High Sensitivity) 11/06/2021 618 (HH)  <18 ng/L Final   Glucose-Capillary 11/06/2021 184 (H)  70 - 99 mg/dL Final   Comment 1 11/06/2021 Notify RN   Final   Glucose-Capillary 11/06/2021 202 (H)  70 - 99 mg/dL Final   Comment 1 11/06/2021 Notify RN   Final   Troponin I (High Sensitivity) 11/06/2021 721 (HH)  <18 ng/L Final   Glucose-Capillary 11/06/2021 197 (H)  70 - 99 mg/dL Final   Comment 1 11/06/2021 Notify RN   Final   Troponin I (High Sensitivity) 11/06/2021 676 (HH)  <18 ng/L Final   Glucose-Capillary 11/06/2021 267 (H)  70 - 99 mg/dL Final    Imaging Studies  CT HEAD WO CONTRAST  Result Date: 11/05/2021 CLINICAL  DATA:  Minor head trauma.  Fall. EXAM: CT HEAD WITHOUT CONTRAST TECHNIQUE: Contiguous axial images were obtained from the base of the skull through the vertex without intravenous contrast. COMPARISON:  None. FINDINGS: Brain: No subdural,  epidural, or subarachnoid hemorrhage identified. Ventricles and sulci are mildly prominent. Cerebellum, brainstem, and basal cisterns are normal. No mass effect or midline shift. No acute cortical ischemia or infarct. Vascular: Calcified atherosclerosis seen in the intracranial carotids. Skull: Normal. Negative for fracture or focal lesion. Sinuses/Orbits: No acute finding. Other: None. IMPRESSION: No acute intracranial abnormalities identified. Electronically Signed   By: Dorise Bullion III M.D.   On: 11/05/2021 11:53   DG Chest Port 1 View  Result Date: 11/05/2021 CLINICAL DATA:  Hypoxia. EXAM: PORTABLE CHEST 1 VIEW COMPARISON:  Chest radiograph dated 03/16/2020. FINDINGS: Shallow inspiration. No focal consolidation, pleural effusion or pneumothorax. There is cardiomegaly with vascular congestion and possible mild edema. No acute osseous pathology. IMPRESSION: Cardiomegaly with vascular congestion and possible mild edema. No focal consolidation. Electronically Signed   By: Anner Crete M.D.   On: 11/05/2021 22:10   ECHOCARDIOGRAM COMPLETE  Result Date: 11/06/2021    ECHOCARDIOGRAM REPORT   Patient Name:   CHEO BANAAG Date of Exam: 11/06/2021 Medical Rec #:  [id number]     Height:       68.0 in Accession #:    [id number]    Weight:       213.0 lb Date of Birth:  February 17, 1941      BSA:          2.099 m Patient Age:    70 years      BP:           88/59 mmHg Patient Gender: M             HR:           85 bpm. Exam Location:  [facility] Procedure: 2D Echo and Intracardiac Opacification Agent Indications:     CHF I50.9  History:         Patient has prior history of Echocardiogram examinations, most                  recent 03/17/2020.  Sonographer:     Kathlen Brunswick RDCS Referring Phys:  [id number] Fred[place] Diagnosing Phys: Ida Rogue MD  Sonographer Comments: Technically difficult study due to poor echo windows and suboptimal apical window. IMPRESSIONS  1. Left ventricular ejection fraction, by estimation, is  25 to 30%. The left ventricle has severely decreased function. The left ventricle demonstrates global hypokinesis with severe hypokinesis of the anerior/anteroseptal and apical region. Left ventricular diastolic parameters are consistent with Grade II diastolic dysfunction (pseudonormalization).  2. Right ventricular systolic function is moderately reduced. The right ventricular size is mildly enlarged. Tricuspid regurgitation signal is inadequate for assessing PA pressure.  3. Left atrial size was moderately dilated.  4. Right atrial size was moderately dilated.  5. The mitral valve is normal in structure. Mild to moderate mitral valve regurgitation. No evidence of mitral stenosis.  6. The aortic valve is normal in structure. Aortic valve regurgitation is not visualized. Aortic valve sclerosis/calcification is present, without any evidence of aortic stenosis. FINDINGS  Left Ventricle: Left ventricular ejection fraction, by estimation, is 25 to 30%. The left ventricle has severely decreased function. The left ventricle demonstrates global hypokinesis. Definity contrast agent was given IV to delineate the left ventricular endocardial borders. The left ventricular internal cavity size was normal in size. There  is no left ventricular hypertrophy. Left ventricular diastolic parameters are consistent with Grade II diastolic dysfunction (pseudonormalization). Right Ventricle: The right ventricular size is mildly enlarged. No increase in right ventricular wall thickness. Right ventricular systolic function is moderately reduced. Tricuspid regurgitation signal is inadequate for assessing PA pressure. Left Atrium: Left atrial size was moderately dilated. Right Atrium: Right atrial size was moderately dilated. Pericardium: There is no evidence of pericardial effusion. Mitral Valve: The mitral valve is normal in structure. Mild to moderate mitral valve regurgitation. No evidence of mitral valve stenosis. Tricuspid Valve: The  tricuspid valve is normal in structure. Tricuspid valve regurgitation is not demonstrated. No evidence of tricuspid stenosis. Aortic Valve: The aortic valve is normal in structure. Aortic valve regurgitation is not visualized. Aortic valve sclerosis/calcification is present, without any evidence of aortic stenosis. Aortic valve peak gradient measures 4.6 mmHg. Pulmonic Valve: The pulmonic valve was normal in structure. Pulmonic valve regurgitation is not visualized. No evidence of pulmonic stenosis. Aorta: The aortic root is normal in size and structure. Venous: The pulmonary veins were not well visualized. The inferior vena cava was not well visualized. IAS/Shunts: No atrial level shunt detected by color flow Doppler.  LEFT VENTRICLE PLAX 2D LVIDd:         5.50 cm      Diastology LVIDs:         4.75 cm      LV e' medial:    6.85 cm/s LV PW:         1.15 cm      LV E/e' medial:  12.4 LV IVS:        1.15 cm      LV e' lateral:   8.70 cm/s LVOT diam:     2.20 cm      LV E/e' lateral: 9.7 LV SV:         37 LV SV Index:   18 LVOT Area:     3.80 cm  LV Volumes (MOD) LV vol d, MOD A2C: 137.0 ml LV vol d, MOD A4C: 209.0 ml LV vol s, MOD A2C: 88.3 ml LV vol s, MOD A4C: 156.0 ml LV SV MOD A2C:     48.7 ml LV SV MOD A4C:     209.0 ml LV SV MOD BP:      44.8 ml RIGHT VENTRICLE RV Basal diam:  4.40 cm RV S prime:     11.30 cm/s TAPSE (M-mode): 1.6 cm LEFT ATRIUM             Index        RIGHT ATRIUM           Index LA diam:        4.80 cm 2.29 cm/m   RA Area:     30.00 cm LA Vol (A2C):   43.3 ml 20.63 ml/m  RA Volume:   115.00 ml 54.78 ml/m LA Vol (A4C):   82.0 ml 39.06 ml/m LA Biplane Vol: 61.4 ml 29.25 ml/m  AORTIC VALVE                 PULMONIC VALVE AV Area (Vmax): 2.02 cm     PV Vmax:       0.71 m/s AV Vmax:        107.00 cm/s  PV Peak grad:  2.0 mmHg AV Peak Grad:   4.6 mmHg LVOT Vmax:      56.80 cm/s LVOT Vmean:     36.900 cm/s LVOT VTI:  0.098 m  AORTA Ao Root diam: 3.40 cm Ao Asc diam:  3.30 cm MITRAL  VALVE MV Area (PHT): 5.88 cm    SHUNTS MV Decel Time: 129 msec    Systemic VTI:  0.10 m MV E velocity: 84.80 cm/s  Systemic Diam: 2.20 cm MV A velocity: 58.70 cm/s MV E/A ratio:  1.44 Ida Rogue MD Electronically signed by Ida Rogue MD Signature Date/Time: 11/06/2021/11:34:03 AM    Final    DG Hip Unilat  With Pelvis 2-3 Views Right  Result Date: 11/05/2021 CLINICAL DATA:  [place] onto right side. Right hip pain. Unable to bear weight on the right leg. EXAM: DG HIP (WITH OR WITHOUT PELVIS) 2-3V RIGHT COMPARISON:  None. FINDINGS: Subtle nondisplaced fracture of the inferior right pubic ramus is suggested. No other evidence of a fracture.  No bone lesion. Hip joints, SI joints and symphysis pubis are normally aligned. Skeletal structures are diffusely demineralized. There are scattered arterial vascular calcifications. Soft tissues otherwise unremarkable. IMPRESSION: 1. Subtle nondisplaced fracture of the inferior right pubic ramus. No other fractures. No dislocation. Electronically Signed   By: Lajean Manes M.D.   On: 11/05/2021 11:10    Medications   Scheduled Meds:  acetaminophen  1,000 mg Oral TID   aspirin EC  81 mg Oral Daily   clopidogrel  75 mg Oral Daily   enoxaparin (LOVENOX) injection  0.5 mg/kg Subcutaneous Q24H   furosemide  80 mg Intravenous BID   insulin aspart  0-15 Units Subcutaneous TID WC   insulin aspart  0-5 Units Subcutaneous QHS   potassium chloride  10 mEq Oral BID   rosuvastatin  20 mg Oral QHS   No recently discontinued medications to reconcile  LOS: 1 day   Richarda Osmond, DO [facility] 11/07/2021, 6:31 AM   Available by Epic secure chat 7AM-7PM. If 7PM-7AM, please contact night-coverage Refer to [email or web address] to contact the [facility] Attending or Consulting provider for this pt

## 2021-11-07 NOTE — NC FL2 (Signed)
[place] MEDICAID FL2 LEVEL OF CARE SCREENING TOOL     IDENTIFICATION  Patient Name: Aaron Sosa Birthdate: [birth year] Sex: male Admission Date (Current Location): 11/05/2021  [place] and [place] Number:  Chiropodist and Address:  [facility], [address] [place], [place] [address]      Provider Number: [id number]  Attending Physician Name and Address:  Leeroy Bock, MD  Relative Name and Phone Number:  Hope Budds- daughter- [phone]    Current Level of Care: Hospital Recommended Level of Care: Skilled Nursing Facility Prior Approval Number:    Date Approved/Denied:   PASRR Number: [id number]  Discharge Plan: SNF    Current Diagnoses: Patient Active Problem List   Diagnosis Date Noted   Acute on chronic combined systolic and diastolic CHF (congestive heart failure) (HCC) 11/06/2021   Elevated troponin 11/06/2021   Closed fracture of ramus of right pubis (HCC) 11/05/2021   Chronic systolic CHF (congestive heart failure) (HCC) 11/05/2021   Leukocytosis 11/05/2021   Hyponatremia 11/05/2021   Type 2 diabetes mellitus with hyperglycemia (HCC) 11/05/2021   NSTEMI (non-ST elevated myocardial infarction) (HCC) 03/18/2020   Acute exacerbation of CHF (congestive heart failure) (HCC) 03/16/2020   Diabetic retinopathy (HCC) 05/23/2019   Myalgia due to statin 03/12/2019   Obesity (BMI 30.0-34.9) 12/30/2018   Essential hypertension 08/14/2018   Type 2 diabetes mellitus (HCC) 08/14/2018   Hyperlipidemia associated with type 2 diabetes mellitus (HCC) 08/14/2018   Lower extremity edema 08/14/2018   Hearing loss of both ears 08/14/2018    Orientation RESPIRATION BLADDER Height & Weight     Self, Time, Situation, Place  Normal Continent Weight: 96.6 kg Height:  5\' 8"  (172.7 cm)  BEHAVIORAL SYMPTOMS/MOOD NEUROLOGICAL BOWEL NUTRITION STATUS      Continent Diet (heart healthy/ carb modified- 1500 ml fluid restriction)  AMBULATORY  STATUS COMMUNICATION OF NEEDS Skin   Extensive Assist Verbally Normal                       Personal Care Assistance Level of Assistance  Bathing, Feeding, Dressing Bathing Assistance: Limited assistance Feeding assistance: Independent Dressing Assistance: Limited assistance     Functional Limitations Info  Sight, Hearing, Speech Sight Info: Impaired (glasses) Hearing Info: Impaired (hearing aides) Speech Info: Adequate    SPECIAL CARE FACTORS FREQUENCY  PT (By licensed PT), OT (By licensed OT)     PT Frequency: 5 times per week OT Frequency: 5 times per week            Contractures Contractures Info: Not present    Additional Factors Info  Code Status, Allergies Code Status Info: Full Allergies Info: empagliflozin, prednisone, shrimp, atorvastatin, codeine, dust mite extract, shellfish           Current Medications (11/07/2021):  This is the current hospital active medication list Current Facility-Administered Medications  Medication Dose Route Frequency Provider Last Rate Last Admin   acetaminophen (TYLENOL) tablet 1,000 mg  1,000 mg Oral TID 01/05/2022, MD   1,000 mg at 11/07/21 1449   aspirin EC tablet 81 mg  81 mg Oral Daily 01/05/22, MD   81 mg at 11/07/21 0956   clopidogrel (PLAVIX) tablet 75 mg  75 mg Oral Daily 01/05/22, MD   75 mg at 11/07/21 0956   enoxaparin (LOVENOX) injection 47.5 mg  0.5 mg/kg Subcutaneous Q24H 01/05/22, MD   47.5 mg at 11/06/21 2155   furosemide (LASIX)  injection 80 mg  80 mg Intravenous BID Alberteen Sam, MD   80 mg at 11/07/21 1449   insulin aspart (novoLOG) injection 0-15 Units  0-15 Units Subcutaneous TID WC Adefeso, Oladapo, DO   5 Units at 11/07/21 1649   insulin glargine-yfgn (SEMGLEE) injection 10 Units  10 Units Subcutaneous Daily Leeroy Bock, MD   10 Units at 11/07/21 1237   oxyCODONE (Oxy IR/ROXICODONE) immediate release tablet 2.5 mg  2.5 mg Oral Q6H  PRN Danford, Earl Lites, MD       potassium chloride SA (KLOR-CON M) CR tablet 10 mEq  10 mEq Oral BID Alberteen Sam, MD   10 mEq at 11/07/21 0956   rosuvastatin (CRESTOR) tablet 20 mg  20 mg Oral QHS Alberteen Sam, MD   20 mg at 11/06/21 2159   Current Outpatient Medications  Medication Sig Dispense Refill   acetaminophen (TYLENOL) 500 MG tablet Take 2 tablets (1,000 mg total) by mouth every 8 (eight) hours as needed for mild pain, moderate pain or headache. 30 tablet 0   aspirin EC 81 MG EC tablet Take 1 tablet (81 mg total) by mouth daily.     calcium carbonate (TUMS - DOSED IN MG ELEMENTAL CALCIUM) 500 MG chewable tablet Chew 1 tablet (200 mg of elemental calcium total) by mouth 3 (three) times daily as needed for indigestion or heartburn.     clopidogrel (PLAVIX) 75 MG tablet Take 75 mg by mouth daily.     docusate sodium (COLACE) 100 MG capsule Take 1 capsule (100 mg total) by mouth 2 (two) times daily as needed for mild constipation or moderate constipation. 10 capsule 0   ENTRESTO 24-26 MG Take 1 tablet by mouth 2 (two) times daily.     glipiZIDE (GLUCOTROL XL) 10 MG 24 hr tablet Take 10 mg by mouth daily.     magnesium oxide (MAG-OX) 400 MG tablet Take 400 mg by mouth 2 (two) times daily.     metFORMIN (GLUCOPHAGE-XR) 500 MG 24 hr tablet Take 2,000 mg by mouth daily with supper. With dinner     metolazone (ZAROXOLYN) 5 MG tablet Take 5 mg by mouth as directed. Take 1 tablet (5 mg total) by mouth every Tuesday, Thursday, Saturday, and Sunday     metoprolol succinate (TOPROL-XL) 25 MG 24 hr tablet Take 25 mg by mouth daily.     Multiple Vitamins-Minerals (PRESERVISION AREDS 2 PO) Take 1 tablet by mouth 2 (two) times daily.     nitroGLYCERIN (NITROSTAT) 0.4 MG SL tablet Place under the tongue.     polyethylene glycol powder (GLYCOLAX/MIRALAX) 17 GM/SCOOP powder Take 17 g by mouth daily as needed. Take 1 packet (17 g total) by mouth once daily as needed for Constipation  Mix in 4-8ounces of fluid prior to taking.     rosuvastatin (CRESTOR) 20 MG tablet Take 20 mg by mouth at bedtime.     senna-docusate (SENOKOT-S) 8.6-50 MG tablet Take 2 tablets by mouth 2 (two) times daily as needed for constipation.     torsemide (DEMADEX) 100 MG tablet Take 100 mg by mouth daily.     acetaminophen (TYLENOL) 650 MG CR tablet Take 650 mg by mouth 2 (two) times daily as needed for pain. (Patient not taking: Reported on 11/06/2021)     alum & mag hydroxide-simeth (MAALOX/MYLANTA) 200-200-20 MG/5ML suspension Take 15 mLs by mouth every 6 (six) hours as needed for indigestion or heartburn. (Patient not taking: Reported on 11/06/2021) 355 mL 0  furosemide (LASIX) 10 MG/ML injection Inject 4 mLs (40 mg total) into the vein 2 (two) times daily. (Patient not taking: Reported on 11/06/2021) 4 mL 0   metoprolol tartrate (LOPRESSOR) 25 MG tablet Take 0.5 tablets (12.5 mg total) by mouth 2 (two) times daily. (Patient not taking: Reported on 11/06/2021)     ONETOUCH ULTRA test strip Use to check blood sugar up to 2 x daily 50 each 12   PROAIR HFA 108 (90 Base) MCG/ACT inhaler Inhale 2 puffs into the lungs every 4 (four) hours as needed for shortness of breath or wheezing.       Discharge Medications: Please see discharge summary for a list of discharge medications.  Relevant Imaging Results:  Relevant Lab Results:   Additional Information SS# [id number]  Allayne Butcher, RN

## 2021-11-07 NOTE — ED Notes (Signed)
Pt placed on bed pan for BM. Bed pan removed once pt was finished. Pt cleaned and repositioned in bed by this tech. Pt has no complaints at this time. Will continue to monitor.

## 2021-11-07 NOTE — TOC Initial Note (Signed)
Transition of Care [facility]) - Initial/Assessment Note    Patient Details  Name: Aaron Sosa MRN: [id number] Date of Birth: 16-Apr-1941  [name]) CM/SW Contact:    Shelbie Hutching, RN Phone Number: 11/07/2021, 5:24 PM  Clinical Narrative:                 Patient admitted to the hospital with acute on chronic combined systolic and diastolic CHF.  RNCM met with patient at the bedside, sister is also present at the bedside.  Patient is from home where he lives alone and is independent.  He does not drive any more but his daughter provides transportation.  He has a walker and cane at home but does not need to use them. He is current with his PCP. PT is recommending SNF for rehab.  Patient agrees.  RNCM will start bed search.    Expected Discharge Plan: Skilled Nursing Facility Barriers to Discharge: Continued Medical Work up   Patient Goals and CMS Choice Patient states their goals for this hospitalization and ongoing recovery are:: to get back home CMS Medicare.gov Compare Post Acute Care list provided to:: Patient Choice offered to / list presented to : Patient, Adult Children  Expected Discharge Plan and Services Expected Discharge Plan: [place]   Discharge Planning Services: CM Consult Post Acute Care Choice: [place] Living arrangements for the past 2 months: Single Family Home                 DME Arranged: N/A DME Agency: NA       HH Arranged: NA HH Agency: NA        Prior Living Arrangements/Services Living arrangements for the past 2 months: Single Family Home Lives with:: Self Patient language and need for interpreter reviewed:: Yes Do you feel safe going back to the place where you live?: Yes      Need for Family Participation in Patient Care: Yes (Comment) Care giver support system in place?: Yes (comment) (daughter and sister) Current home services: DME (cane and walker) Criminal Activity/Legal Involvement Pertinent  to Current Situation/Hospitalization: No - Comment as needed  Activities of Daily Living      Permission Sought/Granted Permission sought to share information with : Case Manager, Customer service manager, Family Supports Permission granted to share information with : Yes, Verbal Permission Granted  Share Information with NAME: Darla Lesches  Permission granted to share info w AGENCY: SNF's  Permission granted to share info w Relationship: daughter  Permission granted to share info w Contact Information: [phone]  Emotional Assessment Appearance:: Appears stated age Attitude/Demeanor/Rapport: Engaged Affect (typically observed): Accepting Orientation: : Oriented to Self, Oriented to Place, Oriented to  Time, Oriented to Situation Alcohol / Substance Use: Not Applicable Psych Involvement: No (comment)  Admission diagnosis:  CHF (congestive heart failure) (HCC) [I50.9] Acute on chronic combined systolic and diastolic CHF (congestive heart failure) (HCC) [I50.43] Patient Active Problem List   Diagnosis Date Noted   Acute on chronic combined systolic and diastolic CHF (congestive heart failure) ([place]) 11/06/2021   Elevated troponin 11/06/2021   Closed fracture of ramus of right pubis (HCC) 06/12/4817   Chronic systolic CHF (congestive heart failure) ([place]) 11/05/2021   Leukocytosis 11/05/2021   Hyponatremia 11/05/2021   Type 2 diabetes mellitus with hyperglycemia ([place]) 11/05/2021   NSTEMI (non-ST elevated myocardial infarction) ([place]) 03/18/2020   Acute exacerbation of CHF (congestive heart failure) ([place]) 03/16/2020   Diabetic retinopathy ([place]) 05/23/2019   Myalgia due  to statin 03/12/2019   Obesity (BMI 30.0-34.9) 12/30/2018   Essential hypertension 08/14/2018   Type 2 diabetes mellitus ([place]) 08/14/2018   Hyperlipidemia associated with type 2 diabetes mellitus ([place]) 08/14/2018   Lower extremity edema 08/14/2018   Hearing loss of both ears 08/14/2018   PCP:  Kirk Ruths, MD Pharmacy:   [place] [place] [facility] [place] [address] Phone: [phone] Fax: [phone]     Social Determinants of Health (SDOH) Interventions    Readmission Risk Interventions No flowsheet data found.

## 2021-11-07 NOTE — Progress Notes (Signed)
Physical Therapy Treatment Patient Details Name: Aaron Sosa MRN: [id number] DOB: [birth year] Today's Date: 11/07/2021   History of Present Illness Patient is a 81 year old male with HTN, sCHF EF 20-25%, DM, and CAD who presented with fall and right hip pain. Radiograph of hip showed no hip fracture but right inferior pubic ramus fracture with WBAT recommended. Also found to have acute on chronic combined systolic and diastolic CHF, elevated troponin suspected to be demand ischemia.    PT Comments    Patient alert, in bed with family at bedside. Reported 0/10 pain at rest, with mobility and standing attempts stated 13-14/10 pain. RN notified of pt request for pain medication. The patient was able to perform heel slides, AAROM for RLE due to pain. Supine to sit with modA, extended time and reliance of bed rails/UE support. Able to sit for several minutes with supervision, intermittently without BUE support. BP assessed 111/71. Sit <> stand with modA and RW, effortful for pt, educated on safe hand placement for RW. Unable to take any steps due to pain (unable to lift LLE from floor despite RW and maxA). Returned to supine with modA. Pt in bed, all needs in reach. The patient would benefit from further skilled PT intervention to continue to progress towards goals. Recommendation remains appropriate.       Recommendations for follow up therapy are one component of a multi-disciplinary discharge planning process, led by the attending physician.  Recommendations may be updated based on patient status, additional functional criteria and insurance authorization.  Follow Up Recommendations  Skilled nursing-short term rehab (<3 hours/day)     Assistance Recommended at Discharge Frequent or constant Supervision/Assistance  Patient can return home with the following A lot of help with walking and/or transfers;Two people to help with bathing/dressing/bathroom;Help with stairs or ramp for entrance;Assist for  transportation;Assistance with cooking/housework   Equipment Recommendations  None recommended by PT    Recommendations for Other Services OT consult     Precautions / Restrictions Precautions Precautions: Fall Restrictions Weight Bearing Restrictions: Yes RLE Weight Bearing: Weight bearing as tolerated     Mobility  Bed Mobility Overal bed mobility: Needs Assistance Bed Mobility: Supine to Sit;Sit to Supine     Supine to sit: Min assist Sit to supine: Mod assist   General bed mobility comments: extended time/reliance on bed rails    Transfers Overall transfer level: Needs assistance Equipment used: Rolling walker (2 wheels) Transfers: Sit to/from Stand Sit to Stand: Mod assist           General transfer comment: Pt refused to move LLE due to RLE pain    Ambulation/Gait               General Gait Details: unable   Stairs             Wheelchair Mobility    Modified Rankin (Stroke Patients Only)       Balance Overall balance assessment: Needs assistance Sitting-balance support: No upper extremity supported;Feet unsupported Sitting balance-Leahy Scale: Good     Standing balance support: Bilateral upper extremity supported;Reliant on assistive device for balance Standing balance-Leahy Scale: Poor                              Cognition Arousal/Alertness: Awake/alert Behavior During Therapy: WFL for tasks assessed/performed Overall Cognitive Status: Within Functional Limits for tasks assessed  Exercises General Exercises - Lower Extremity Long Arc Quad: 5 reps;Both;AROM Heel Slides: 5 reps;AAROM;Right;AROM;Left    General Comments        Pertinent Vitals/Pain Pain Assessment: 0-10 Pain Score: 0-No pain Pain Location: Right Hip; elevated pain w/ mobility 13-14/10 Pain Descriptors / Indicators: Grimacing;Moaning;Guarding Pain Intervention(s): Limited activity  within patient's tolerance;Patient requesting pain meds-RN notified;Monitored during session;Repositioned    Home Living                          Prior Function            PT Goals (current goals can now be found in the care plan section) Progress towards PT goals: Progressing toward goals    Frequency    Min 2X/week      PT Plan Current plan remains appropriate    Co-evaluation              AM-PAC PT "6 Clicks" Mobility   Outcome Measure  Help needed turning from your back to your side while in a flat bed without using bedrails?: A Lot Help needed moving from lying on your back to sitting on the side of a flat bed without using bedrails?: A Lot Help needed moving to and from a bed to a chair (including a wheelchair)?: Total Help needed standing up from a chair using your arms (e.g., wheelchair or bedside chair)?: A Lot Help needed to walk in hospital room?: Total Help needed climbing 3-5 steps with a railing? : Total 6 Click Score: 9    End of Session Equipment Utilized During Treatment: Gait belt Activity Tolerance: Patient limited by pain Patient left: in bed;with call bell/phone within reach;with bed alarm set;with family/visitor present Nurse Communication: Patient requests pain meds;Mobility status PT Visit Diagnosis: Pain;Other abnormalities of gait and mobility (R26.89);Difficulty in walking, not elsewhere classified (R26.2) Pain - Right/Left: Right Pain - part of body: Hip     Time: 1414-1440 PT Time Calculation (min) (ACUTE ONLY): 26 min  Charges:  $Therapeutic Activity: 23-37 mins                     Olga Coaster PT, DPT 3:08 PM,11/07/21

## 2021-11-07 NOTE — Progress Notes (Signed)
Patient ID: Aaron Sosa, male   DOB: 10-27-1941, 81 y.o.   MRN: [id number]  Subjective: Patient has no new complaints.  He is relatively comfortable at rest, but has increased pain with attempted active movement of his right lower extremity.   Objective: Vital signs in last 24 hours: Pulse Rate:  [83-95] 91 (01/09 0942) Resp:  [14-25] 15 (01/09 0942) BP: (82-102)/(53-69) 87/59 (01/09 0942) SpO2:  [90 %-98 %] 92 % (01/09 0942)  Intake/Output from previous day: 01/08 0701 - 01/09 0700 In: -  Out: 1600 [Urine:1600] Intake/Output this shift: No intake/output data recorded.  Recent Labs    11/05/21 1410 11/07/21 0709  HGB 14.6 13.5   Recent Labs    11/05/21 1410 11/07/21 0709  WBC 13.9* 8.4  RBC 5.19 4.94  HCT 44.5 41.7  PLT 224 171   Recent Labs    11/05/21 1410 11/07/21 0709  NA 129* 131*  K 4.2 3.1*  CL 89* 91*  CO2 26 28  BUN 48* 49*  CREATININE 1.24 1.23  GLUCOSE 239* 212*  CALCIUM 8.7* 8.2*   No results for input(s): LABPT, INR in the last 72 hours.  Physical Exam: Orthopedic examination is limited to the right hip and lower extremity.  Overall, his findings are unchanged as compared to yesterday.  He still has pain with attempted hip flexion and other attempted active motions of the hip and leg, but can tolerate gentle logrolling.  He is neurovascularly intact to the right lower extremity.  Assessment: Nondisplaced right superior and inferior pubic rami fractures.  Plan: Continue to mobilize the patient with physical therapy as symptoms permit.  May continue to provide pain medication as deemed necessary medically.   Aaron Sosa 11/07/2021, 3:31 PM

## 2021-11-08 DIAGNOSIS — R0602 Shortness of breath: Secondary | ICD-10-CM

## 2021-11-08 DIAGNOSIS — R0902 Hypoxemia: Secondary | ICD-10-CM

## 2021-11-08 LAB — GLUCOSE, CAPILLARY
Glucose-Capillary: 202 mg/dL — ABNORMAL HIGH (ref 70–99)
Glucose-Capillary: 214 mg/dL — ABNORMAL HIGH (ref 70–99)
Glucose-Capillary: 135 mg/dL — ABNORMAL HIGH (ref 70–99)
Glucose-Capillary: 214 mg/dL — ABNORMAL HIGH (ref 70–99)

## 2021-11-08 LAB — BASIC METABOLIC PANEL
Anion gap: 11 (ref 5–15)
BUN: 48 mg/dL — ABNORMAL HIGH (ref 8–23)
CO2: 30 mmol/L (ref 22–32)
Calcium: 8.4 mg/dL — ABNORMAL LOW (ref 8.9–10.3)
Chloride: 89 mmol/L — ABNORMAL LOW (ref 98–111)
Creatinine, Ser: 1.17 mg/dL (ref 0.61–1.24)
GFR, Estimated: >60 mL/min (ref >60)
Glucose, Bld: 193 mg/dL — ABNORMAL HIGH (ref 70–99)
Potassium: 3.1 mmol/L — ABNORMAL LOW (ref 3.5–5.1)
Sodium: 130 mmol/L — ABNORMAL LOW (ref 135–145)

## 2021-11-08 MED ORDER — TORSEMIDE 20 MG PO TABS
40.0000 mg | ORAL_TABLET | Freq: Every day | ORAL | Status: DC
  Administered 2021-11-09 – 2021-11-14 (×6): 40 mg via ORAL
  Filled 2021-11-08 (×6): qty 2

## 2021-11-08 MED ORDER — POTASSIUM CHLORIDE IN NACL 40-0.9 MEQ/L-% IV SOLN
INTRAVENOUS | Status: AC
  Filled 2021-11-08: qty 1000

## 2021-11-08 NOTE — Progress Notes (Signed)
Physical Therapy Treatment Patient Details Name: Aaron Sosa MRN: [id number] DOB: [birth year] Today's Date: 11/08/2021   History of Present Illness Patient is a 81 year old male with HTN, sCHF EF 20-25%, DM, and CAD who presented with fall and right hip pain. Radiograph of hip showed no hip fracture but right inferior pubic ramus fracture with WBAT recommended. Also found to have acute on chronic combined systolic and diastolic CHF, elevated troponin suspected to be demand ischemia.    PT Comments    Patient alert and in bed. He reported 0/10 pain at rest and 7-8/10 pain with mobility and standing. Pt was able to perform LLE heel slides and hip ABD/ADD AROM, but required AAROM on RLE due to pain/weakness with these exercises.   Supine to sit with minA due to extended time and reliance on bed railing and some UE support to fully pull to seated position. Pt was able to perform sit to stand with modA and RW for UE support. From standing position Pt was then able to take 3 lateral steps with modA, RW, and verbal cueing for appropriate utilization of UE support and LE weight transfer before having to stop due to increase in RLE pain. Pt returned to bed with modA, but needed verbal cueing on how to transition back to supine position safely in order to not hit head against bed railings. Pt will benefit from continued skilled PT to improve LE strength, mobility, gait, and decrease c/o RLE pain.   Recommendations for follow up therapy are one component of a multi-disciplinary discharge planning process, led by the attending physician.  Recommendations may be updated based on patient status, additional functional criteria and insurance authorization.  Follow Up Recommendations  Skilled nursing-short term rehab (<3 hours/day)     Assistance Recommended at Discharge Frequent or constant Supervision/Assistance  Patient can return home with the following A lot of help with walking and/or transfers;Two people  to help with bathing/dressing/bathroom;Assistance with cooking/housework;Assist for transportation;Help with stairs or ramp for entrance   Equipment Recommendations  None recommended by PT    Recommendations for Other Services OT consult     Precautions / Restrictions Precautions Precautions: Fall Restrictions Weight Bearing Restrictions: Yes RLE Weight Bearing: Weight bearing as tolerated     Mobility  Bed Mobility Overal bed mobility: Needs Assistance Bed Mobility: Supine to Sit;Sit to Supine     Supine to sit: Min assist Sit to supine: Mod assist   General bed mobility comments: extended time/reliance on bed rails    Transfers Overall transfer level: Needs assistance Equipment used: Rolling walker (2 wheels) Transfers: Sit to/from Stand Sit to Stand: Mod assist           General transfer comment: Pt was able to take 3 lateral steps to the right. Refused to move further due to RLE pain.    Ambulation/Gait               General Gait Details: unable   Stairs             Wheelchair Mobility    Modified Rankin (Stroke Patients Only)       Balance Overall balance assessment: Needs assistance Sitting-balance support: No upper extremity supported;Feet unsupported Sitting balance-Leahy Scale: Good     Standing balance support: Bilateral upper extremity supported;Reliant on assistive device for balance Standing balance-Leahy Scale: Poor  Cognition Arousal/Alertness: Awake/alert Behavior During Therapy: WFL for tasks assessed/performed Overall Cognitive Status: Within Functional Limits for tasks assessed                                 General Comments: patient is able to follow all commands without difficulty. patient is [facility]        Exercises General Exercises - Lower Extremity Heel Slides: 5 reps;AAROM;Right;AROM;Left Hip ABduction/ADduction: AROM;Left;AAROM;Right;10 reps     General Comments        Pertinent Vitals/Pain Pain Assessment: 0-10 Pain Score: 0-No pain Pain Location: Right Hip; elevated pain w/ mobility 7-8/10 Pain Descriptors / Indicators: Moaning;Grimacing;Guarding Pain Intervention(s): Limited activity within patient's tolerance;Monitored during session;Repositioned    Home Living                          Prior Function            PT Goals (current goals can now be found in the care plan section) Progress towards PT goals: Progressing toward goals    Frequency    Min 2X/week      PT Plan Current plan remains appropriate    Co-evaluation              AM-PAC PT "6 Clicks" Mobility   Outcome Measure  Help needed turning from your back to your side while in a flat bed without using bedrails?: A Lot Help needed moving from lying on your back to sitting on the side of a flat bed without using bedrails?: A Lot Help needed moving to and from a bed to a chair (including a wheelchair)?: Total Help needed standing up from a chair using your arms (e.g., wheelchair or bedside chair)?: A Lot Help needed to walk in hospital room?: Total Help needed climbing 3-5 steps with a railing? : Total 6 Click Score: 9    End of Session Equipment Utilized During Treatment: Gait belt Activity Tolerance: Patient limited by pain Patient left: in bed;with call bell/phone within reach;with family/visitor present;with bed alarm set Nurse Communication: Mobility status PT Visit Diagnosis: Pain;Other abnormalities of gait and mobility (R26.89);Difficulty in walking, not elsewhere classified (R26.2) Pain - Right/Left: Right Pain - part of body: Hip     Time: [id number] PT Time Calculation (min) (ACUTE ONLY): 23 min  Charges:  $Therapeutic Activity: 23-37 mins                     Lieutenant Diego PT, DPT 1:04 PM,11/08/21

## 2021-11-08 NOTE — Progress Notes (Signed)
PROGRESS NOTE  Aaron Sosa    DOB: [birth year], 81 y.o.  [id number]  PCP: Kirk Ruths, MD   Code Status: Prior   DOA: 11/05/2021   LOS: 2  Brief Narrative of Current Hospitalization  Aaron Sosa is a 81 y.o. 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 They presented from home to the ED on 11/05/2021 with right hip subtotal nondisplaced fracture of superior and inferior right pubic rami after mechanical fall. In the ED, they were treated with analgesia as needed.  Ortho surgery was consulted and recommended nonsurgical management.  Patient was admitted to medicine service for further workup and management of hip fracture as outlined in detail below. Additionally, patient had suspected heart failure exacerbation new O2 requirement and hypokalemia on exam.  History reveals this is likely due to nonadherence with diuretic therapy.  He was treated with IV diuretics and home medications.  11/08/21 -stable, ORA. Medically ready for dc to SNF when bed available.   Assessment & Plan  Principal Problem:   Acute on chronic combined systolic and diastolic CHF (congestive heart failure) (HCC) Active Problems:   Essential hypertension   Obesity (BMI 30.0-34.9)   Closed fracture of ramus of right pubis (HCC)   Leukocytosis   Hyponatremia   Type 2 diabetes mellitus with hyperglycemia (HCC)   Elevated troponin  Acute hypoxic respiratory failure 2/2 HrEF exacerbation- (resolved) likely secondary to non-adherence to home therapies. Responded well to IV diuresis and is now ORA without respiratory complaint. Bps remain soft so home therapies continue to hold and can be added on as tolerated. -Furosemide 40 mg IV daily transitioned to PO torsemide daily today.  -K supplement -Strict I/Os, daily weights, telemetry  -Daily monitoring renal function - Hold Entresto, metoprolol to allow room for diuresis - continue metolazone on discharge if  needed   Pubic ramus fracture, right, closed, initial encounter ([facility])- (present on admission). Awaiting SNF - WBAT - Schedule acetaminophen - Low dose PRN oxycodone - PT   Elevated troponin- (present on admission) I suspect this is demand ischemia in the setting of fluid overload, CHF. Not ACS. No ischemic work up at this time. - Trend Trop to peak   Hyponatremia- (present on admission)- stable Na+ 129>131>130 - Fluid restriction - Lasix and trend Na - giving IV fluids for a few hours today for Na and K+ replacement   Essential hypertension- (present on admission) BP soft -Hold Entresto, metoprolol to allow room for diuresis   Type 2 diabetes mellitus with hyperglycemia ([place])- (present on admission)- bg elevated 202 fasting -Continue SS corrections - add semglee 10units daily and titrate up as needed -Hold home glipizide/metformin   Obesity (BMI 30.0-34.9)- (present on admission) BMI 32   Probably mild cognitive impairment - Recommend outpatient evaluation  DVT prophylaxis: lovenox  Diet:  Diet Orders (From admission, onward)     Start     Ordered   11/06/21 1108  Diet heart healthy/carb modified Room service appropriate? Yes; Fluid consistency: Thin; Fluid restriction: 1500 mL Fluid  Diet effective now       Question Answer Comment  Diet-HS Snack? Nothing   Room service appropriate? Yes   Fluid consistency: Thin   Fluid restriction: 1500 mL Fluid      11/06/21 1107            Subjective 11/08/21    Pt reports feeling continued pelvic pain. Denies chest pain or respiratory complaints.  Disposition Plan & Communication  Patient status: Inpatient  Admitted From: Home Disposition: Skilled nursing facility Anticipated discharge date: TBD, patient is medically stable for dc when bed available  Family Communication: none  Consults, Procedures, Significant Events  Consultants:  Ortho surgery  Procedures/significant events:  None  Antimicrobials:   Anti-infectives (From admission, onward)    None       Objective   Vitals:   11/07/21 2058 11/07/21 2058 11/07/21 2319 11/08/21 0600  BP:  104/63 100/62 99/66  Pulse:  91 88 92  Resp:  18 18 17   Temp: 99 F (37.2 C) 99 F (37.2 C) 98 F (36.7 C) 98.2 F (36.8 C)  TempSrc:   Oral Oral  SpO2:  97% 98% 97%  Weight:    96.2 kg  Height:        Intake/Output Summary (Last 24 hours) at 11/08/2021 0743 Last data filed at 11/08/2021 0600 Gross per 24 hour  Intake 60 ml  Output 1425 ml  Net -1365 ml    Filed Weights   11/05/21 1032 11/08/21 0600  Weight: 96.6 kg 96.2 kg    Patient BMI: Body mass index is 32.25 kg/m.   Physical Exam:  General: awake, alert, NAD HEENT: atraumatic, clear conjunctiva, anicteric sclera, MMM, hearing grossly normal Respiratory: normal respiratory effort. Cardiovascular: normal S1/S2, RRR, no JVD, murmurs, quick capillary refill  Gastrointestinal: soft, NT, ND Nervous: A&O x3. no gross focal neurologic deficits, normal speech Extremities: moves all equally, no edema, normal tone Skin: dry, intact, normal temperature, normal color. No rashes, lesions or ulcers on exposed skin Psychiatry: normal mood, congruent affect  Labs   I have personally reviewed following labs and imaging studies Admission on 11/05/2021  Component Date Value Ref Range Status   Sodium 11/05/2021 129 (L)  135 - 145 mmol/L Final   Potassium 11/05/2021 4.2  3.5 - 5.1 mmol/L Final   Chloride 11/05/2021 89 (L)  98 - 111 mmol/L Final   CO2 11/05/2021 26  22 - 32 mmol/L Final   Glucose, Bld 11/05/2021 239 (H)  70 - 99 mg/dL Final   BUN 11/05/2021 48 (H)  8 - 23 mg/dL Final   Creatinine, Ser 11/05/2021 1.24  0.61 - 1.24 mg/dL Final   Calcium 11/05/2021 8.7 (L)  8.9 - 10.3 mg/dL Final   Total Protein 11/05/2021 7.1  6.5 - 8.1 g/dL Final   Albumin 11/05/2021 4.0  3.5 - 5.0 g/dL Final   AST 11/05/2021 34  15 - 41 U/L Final   ALT 11/05/2021 21  0 - 44 U/L Final   Alkaline  Phosphatase 11/05/2021 83  38 - 126 U/L Final   Total Bilirubin 11/05/2021 2.6 (H)  0.3 - 1.2 mg/dL Final   GFR, Estimated 11/05/2021 59 (L)  >60 mL/min Final   Anion gap 11/05/2021 14  5 - 15 Final   WBC 11/05/2021 13.9 (H)  4.0 - 10.5 K/uL Final   RBC 11/05/2021 5.19  4.22 - 5.81 MIL/uL Final   Hemoglobin 11/05/2021 14.6  13.0 - 17.0 g/dL Final   HCT 11/05/2021 44.5  39.0 - 52.0 % Final   MCV 11/05/2021 85.7  80.0 - 100.0 fL Final   MCH 11/05/2021 28.1  26.0 - 34.0 pg Final   MCHC 11/05/2021 32.8  30.0 - 36.0 g/dL Final   RDW 11/05/2021 14.4  11.5 - 15.5 % Final   Platelets 11/05/2021 224  150 - 400 K/uL Final   nRBC 11/05/2021 0.0  0.0 - 0.2 % Final  Neutrophils Relative % 11/05/2021 88  % Final   Neutro Abs 11/05/2021 12.3 (H)  1.7 - 7.7 K/uL Final   Lymphocytes Relative 11/05/2021 5  % Final   Lymphs Abs 11/05/2021 0.7  0.7 - 4.0 K/uL Final   Monocytes Relative 11/05/2021 6  % Final   Monocytes Absolute 11/05/2021 0.9  0.1 - 1.0 K/uL Final   Eosinophils Relative 11/05/2021 0  % Final   Eosinophils Absolute 11/05/2021 0.0  0.0 - 0.5 K/uL Final   Basophils Relative 11/05/2021 0  % Final   Basophils Absolute 11/05/2021 0.0  0.0 - 0.1 K/uL Final   Immature Granulocytes 11/05/2021 1  % Final   Abs Immature Granulocytes 11/05/2021 0.09 (H)  0.00 - 0.07 K/uL Final   SARS Coronavirus 2 by RT PCR 11/05/2021 NEGATIVE  NEGATIVE Final   Influenza A by PCR 11/05/2021 NEGATIVE  NEGATIVE Final   Influenza B by PCR 11/05/2021 NEGATIVE  NEGATIVE Final   Glucose-Capillary 11/05/2021 240 (H)  70 - 99 mg/dL Final   Hgb A1c MFr Bld 11/05/2021 10.4 (H)  4.8 - 5.6 % Final   Mean Plasma Glucose 11/05/2021 251.78  mg/dL Final   Color, Urine 11/05/2021 YELLOW (A)  YELLOW Final   APPearance 11/05/2021 CLEAR (A)  CLEAR Final   Specific Gravity, Urine 11/05/2021 1.011  1.005 - 1.030 Final   pH 11/05/2021 5.0  5.0 - 8.0 Final   Glucose, UA 11/05/2021 NEGATIVE  NEGATIVE mg/dL Final   Hgb urine dipstick  11/05/2021 NEGATIVE  NEGATIVE Final   Bilirubin Urine 11/05/2021 NEGATIVE  NEGATIVE Final   Ketones, ur 11/05/2021 NEGATIVE  NEGATIVE mg/dL Final   Protein, ur 11/05/2021 NEGATIVE  NEGATIVE mg/dL Final   Nitrite 11/05/2021 NEGATIVE  NEGATIVE Final   Leukocytes,Ua 11/05/2021 NEGATIVE  NEGATIVE Final   Glucose-Capillary 11/05/2021 263 (H)  70 - 99 mg/dL Final   Troponin I (High Sensitivity) 11/05/2021 132 (HH)  <18 ng/L Final   B Natriuretic Peptide 11/05/2021 1,350.4 (H)  0.0 - 100.0 pg/mL Final   Troponin I (High Sensitivity) 11/06/2021 200 (HH)  <18 ng/L Final   Troponin I (High Sensitivity) 11/06/2021 282 (HH)  <18 ng/L Final   Troponin I (High Sensitivity) 11/06/2021 422 (HH)  <18 ng/L Final   Weight 11/06/2021 3,408  oz Final   Height 11/06/2021 68  in Final   BP 11/06/2021 88/59  mmHg Final   Ao pk vel 11/06/2021 1.07  m/s Final   AR max vel 11/06/2021 2.02  cm2 Final   AV Peak grad 11/06/2021 4.6  mmHg Final   Single Plane A2C EF 11/06/2021 35.5  % Final   Single Plane A4C EF 11/06/2021 25.4  % Final   Calc EF 11/06/2021 26.3  % Final   S' Lateral 11/06/2021 4.75  cm Final   Area-P 1/2 11/06/2021 5.88  cm2 Final   Troponin I (High Sensitivity) 11/06/2021 465 (HH)  <18 ng/L Final   Troponin I (High Sensitivity) 11/06/2021 618 (HH)  <18 ng/L Final   Glucose-Capillary 11/06/2021 184 (H)  70 - 99 mg/dL Final   Comment 1 11/06/2021 Notify RN   Final   Glucose-Capillary 11/06/2021 202 (H)  70 - 99 mg/dL Final   Comment 1 11/06/2021 Notify RN   Final   Troponin I (High Sensitivity) 11/06/2021 721 (HH)  <18 ng/L Final   Glucose-Capillary 11/06/2021 197 (H)  70 - 99 mg/dL Final   Comment 1 11/06/2021 Notify RN   Final   Sodium 11/07/2021 131 (L)  135 - 145 mmol/L Final   Potassium 11/07/2021 3.1 (L)  3.5 - 5.1 mmol/L Final   Chloride 11/07/2021 91 (L)  98 - 111 mmol/L Final   CO2 11/07/2021 28  22 - 32 mmol/L Final   Glucose, Bld 11/07/2021 212 (H)  70 - 99 mg/dL Final   BUN  11/07/2021 49 (H)  8 - 23 mg/dL Final   Creatinine, Ser 11/07/2021 1.23  0.61 - 1.24 mg/dL Final   Calcium 11/07/2021 8.2 (L)  8.9 - 10.3 mg/dL Final   GFR, Estimated 11/07/2021 59 (L)  >60 mL/min Final   Anion gap 11/07/2021 12  5 - 15 Final   WBC 11/07/2021 8.4  4.0 - 10.5 K/uL Final   RBC 11/07/2021 4.94  4.22 - 5.81 MIL/uL Final   Hemoglobin 11/07/2021 13.5  13.0 - 17.0 g/dL Final   HCT 11/07/2021 41.7  39.0 - 52.0 % Final   MCV 11/07/2021 84.4  80.0 - 100.0 fL Final   MCH 11/07/2021 27.3  26.0 - 34.0 pg Final   MCHC 11/07/2021 32.4  30.0 - 36.0 g/dL Final   RDW 11/07/2021 14.6  11.5 - 15.5 % Final   Platelets 11/07/2021 171  150 - 400 K/uL Final   nRBC 11/07/2021 0.0  0.0 - 0.2 % Final   Troponin I (High Sensitivity) 11/06/2021 676 (HH)  <18 ng/L Final   Glucose-Capillary 11/06/2021 267 (H)  70 - 99 mg/dL Final   Glucose-Capillary 11/07/2021 214 (H)  70 - 99 mg/dL Final   Glucose-Capillary 11/07/2021 299 (H)  70 - 99 mg/dL Final   Glucose-Capillary 11/07/2021 225 (H)  70 - 99 mg/dL Final   Sodium 11/08/2021 130 (L)  135 - 145 mmol/L Final   Potassium 11/08/2021 3.1 (L)  3.5 - 5.1 mmol/L Final   Chloride 11/08/2021 89 (L)  98 - 111 mmol/L Final   CO2 11/08/2021 30  22 - 32 mmol/L Final   Glucose, Bld 11/08/2021 193 (H)  70 - 99 mg/dL Final   BUN 11/08/2021 48 (H)  8 - 23 mg/dL Final   Creatinine, Ser 11/08/2021 1.17  0.61 - 1.24 mg/dL Final   Calcium 11/08/2021 8.4 (L)  8.9 - 10.3 mg/dL Final   GFR, Estimated 11/08/2021 >60  >60 mL/min Final   Anion gap 11/08/2021 11  5 - 15 Final    Imaging Studies  ECHOCARDIOGRAM COMPLETE  Result Date: 11/06/2021    ECHOCARDIOGRAM REPORT   Patient Name:   MYKAH BARDIN Date of Exam: 11/06/2021 Medical Rec #:  [id number]     Height:       68.0 in Accession #:    [id number]    Weight:       213.0 lb Date of Birth:  07/24/1941      BSA:          2.099 m Patient Age:    39 years      BP:           88/59 mmHg Patient Gender: M             HR:            85 bpm. Exam Location:  [facility] Procedure: 2D Echo and Intracardiac Opacification Agent Indications:     CHF I50.9  History:         Patient has prior history of Echocardiogram examinations, most                  recent 03/17/2020.  Sonographer:  Kathlen Brunswick RDCS Referring Phys:  [id number] BRENDA MORRISON Diagnosing Phys: Ida Rogue MD  Sonographer Comments: Technically difficult study due to poor echo windows and suboptimal apical window. IMPRESSIONS  1. Left ventricular ejection fraction, by estimation, is 25 to 30%. The left ventricle has severely decreased function. The left ventricle demonstrates global hypokinesis with severe hypokinesis of the anerior/anteroseptal and apical region. Left ventricular diastolic parameters are consistent with Grade II diastolic dysfunction (pseudonormalization).  2. Right ventricular systolic function is moderately reduced. The right ventricular size is mildly enlarged. Tricuspid regurgitation signal is inadequate for assessing PA pressure.  3. Left atrial size was moderately dilated.  4. Right atrial size was moderately dilated.  5. The mitral valve is normal in structure. Mild to moderate mitral valve regurgitation. No evidence of mitral stenosis.  6. The aortic valve is normal in structure. Aortic valve regurgitation is not visualized. Aortic valve sclerosis/calcification is present, without any evidence of aortic stenosis. FINDINGS  Left Ventricle: Left ventricular ejection fraction, by estimation, is 25 to 30%. The left ventricle has severely decreased function. The left ventricle demonstrates global hypokinesis. Definity contrast agent was given IV to delineate the left ventricular endocardial borders. The left ventricular internal cavity size was normal in size. There is no left ventricular hypertrophy. Left ventricular diastolic parameters are consistent with Grade II diastolic dysfunction (pseudonormalization). Right Ventricle: The right ventricular size is mildly  enlarged. No increase in right ventricular wall thickness. Right ventricular systolic function is moderately reduced. Tricuspid regurgitation signal is inadequate for assessing PA pressure. Left Atrium: Left atrial size was moderately dilated. Right Atrium: Right atrial size was moderately dilated. Pericardium: There is no evidence of pericardial effusion. Mitral Valve: The mitral valve is normal in structure. Mild to moderate mitral valve regurgitation. No evidence of mitral valve stenosis. Tricuspid Valve: The tricuspid valve is normal in structure. Tricuspid valve regurgitation is not demonstrated. No evidence of tricuspid stenosis. Aortic Valve: The aortic valve is normal in structure. Aortic valve regurgitation is not visualized. Aortic valve sclerosis/calcification is present, without any evidence of aortic stenosis. Aortic valve peak gradient measures 4.6 mmHg. Pulmonic Valve: The pulmonic valve was normal in structure. Pulmonic valve regurgitation is not visualized. No evidence of pulmonic stenosis. Aorta: The aortic root is normal in size and structure. Venous: The pulmonary veins were not well visualized. The inferior vena cava was not well visualized. IAS/Shunts: No atrial level shunt detected by color flow Doppler.  LEFT VENTRICLE PLAX 2D LVIDd:         5.50 cm      Diastology LVIDs:         4.75 cm      LV e' medial:    6.85 cm/s LV PW:         1.15 cm      LV E/e' medial:  12.4 LV IVS:        1.15 cm      LV e' lateral:   8.70 cm/s LVOT diam:     2.20 cm      LV E/e' lateral: 9.7 LV SV:         37 LV SV Index:   18 LVOT Area:     3.80 cm  LV Volumes (MOD) LV vol d, MOD A2C: 137.0 ml LV vol d, MOD A4C: 209.0 ml LV vol s, MOD A2C: 88.3 ml LV vol s, MOD A4C: 156.0 ml LV SV MOD A2C:     48.7 ml LV SV MOD A4C:  209.0 ml LV SV MOD BP:      44.8 ml RIGHT VENTRICLE RV Basal diam:  4.40 cm RV S prime:     11.30 cm/s TAPSE (M-mode): 1.6 cm LEFT ATRIUM             Index        RIGHT ATRIUM           Index LA  diam:        4.80 cm 2.29 cm/m   RA Area:     30.00 cm LA Vol (A2C):   43.3 ml 20.63 ml/m  RA Volume:   115.00 ml 54.78 ml/m LA Vol (A4C):   82.0 ml 39.06 ml/m LA Biplane Vol: 61.4 ml 29.25 ml/m  AORTIC VALVE                 PULMONIC VALVE AV Area (Vmax): 2.02 cm     PV Vmax:       0.71 m/s AV Vmax:        107.00 cm/s  PV Peak grad:  2.0 mmHg AV Peak Grad:   4.6 mmHg LVOT Vmax:      56.80 cm/s LVOT Vmean:     36.900 cm/s LVOT VTI:       0.098 m  AORTA Ao Root diam: 3.40 cm Ao Asc diam:  3.30 cm MITRAL VALVE MV Area (PHT): 5.88 cm    SHUNTS MV Decel Time: 129 msec    Systemic VTI:  0.10 m MV E velocity: 84.80 cm/s  Systemic Diam: 2.20 cm MV A velocity: 58.70 cm/s MV E/A ratio:  1.44 Ida Rogue MD Electronically signed by Ida Rogue MD Signature Date/Time: 11/06/2021/11:34:03 AM    Final     Medications   Scheduled Meds:  acetaminophen  1,000 mg Oral TID   aspirin EC  81 mg Oral Daily   clopidogrel  75 mg Oral Daily   enoxaparin (LOVENOX) injection  0.5 mg/kg Subcutaneous Q24H   furosemide  80 mg Intravenous BID   insulin aspart  0-15 Units Subcutaneous TID WC   insulin glargine-yfgn  10 Units Subcutaneous Daily   potassium chloride  10 mEq Oral BID   rosuvastatin  20 mg Oral QHS   No recently discontinued medications to reconcile  LOS: 2 days   Richarda Osmond, DO [facility] 11/08/2021, 7:43 AM   Available by Epic secure chat 7AM-7PM. If 7PM-7AM, please contact night-coverage Refer to [email or web address] to contact the [facility] Attending or Consulting provider for this pt

## 2021-11-08 NOTE — Consult Note (Addendum)
° °  Heart Failure Nurse Navigator Note  HFrEF 25 to 30%.  Grade 2 diastolic dysfunction.  Presented to the emergency room after sustaining a fall and complaining of right hip pain.  BNP was elevated at 1350.  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  Furosemide is also being held.   Initial meeting with patient, he is lying in bed in no acute distress.  He is very hard of hearing.  States that he has heard the term heart failure for the last couple years, he states that that the meaning to him "is that the heart cannot work like it should."  Patient states that he lives at home by himself and does his own cooking.  He voices he notes that he does not eat a balanced diet as he fixes what is easiest for him which he states is usually Pinto beans, green beans or pork and beans from a can.  Also states he eats convenience dinners.  Voiced understanding and instructed if he has to use vegetables from a can it was best to place them in a colander and rinse them and then cook them in plain water.  Voices understanding.  He states that he has a scale but admits to not weighing himself daily.  Stressed the importance of daily weight and what to report to physician.  Discussed his medications.  He states that he is not always compliant with his fluid pill also if he feels that a medicine is not working for him he is not going to take it.  He states that he feels that he has had more problems with shortness of breath since starting Entresto feels like there is been changes in his vision which he attributes to metformin.   Asked that he discuss that with his physician, he states that he has in the past nothing is done about it.  He states not long ago he could do 8 laps round his backyard but now he feels he can not walk for than 40 feet before having to stop and catch his breath.  Talked about follow-up in the outpatient heart failure clinic with [facility] nurse practitioner, he has an appointment for January 19 at 2 PM he has a 3% no-show ratio which is 1 out of 32 appointments.  He was given information about the outpatient heart failure clinic along with living with heart failure teaching booklet and information on low-sodium.  Pricilla Riffle RN [facility]

## 2021-11-08 NOTE — Progress Notes (Signed)
Inpatient Diabetes Program Recommendations  AACE/ADA: New Consensus Statement on Inpatient Glycemic Control   Target Ranges:  Prepandial:   less than 140 mg/dL      Peak postprandial:   less than 180 mg/dL (1-2 hours)      Critically ill patients:  140 - 180 mg/dL    Latest Reference Range & Units 11/07/21 07:57 11/07/21 11:47 11/07/21 16:21 11/08/21 07:54 11/08/21 11:48  Glucose-Capillary 70 - 99 mg/dL 568 (H) 127 (H) 517 (H) 202 (H) 214 (H)    Latest Reference Range & Units 12/02/19 08:17 11/05/21 17:16  Hemoglobin A1C 4.8 - 5.6 % 7.7 (H) 10.4 (H)   Review of Glycemic Control  Diabetes history: DM2 Outpatient Diabetes medications: Glipizide XL 10 mg QAM, Metformin 1000 mg QPM Current orders for Inpatient glycemic control: Semglee 10 units daily, Novolog 0-15 units TID with meals  Inpatient Diabetes Program Recommendations:    Insulin: Please consider increasing Semglee to 12 units daily and ordering Novolog 3 units TID with meals for meal coverage if patient eats at least 50% of meals.  HbgA1C: A1C 10.4% on 11/05/21 indicating an average glucose of 252 mg/dl over the past 2-3 months.  NOTE: Spoke with patient over the phone about diabetes and home regimen for diabetes control. Patient reports being followed by PCP for diabetes management and currently taking Glipizide XL 10 mg QAM and Metformin 1000 mg QPM as an outpatient for diabetes control. Patient reports taking DM medications as prescribed.  Patient states that he does not check glucose very often at home because he has a lot of trouble getting enough blood for the machine to read a glucose.  Discussed A1C results (10.4% on 11/05/21) and explained that current A1C indicates an average glucose of 252 mg/dl over the past 2-3 months. Patient was very surprised that his A1C had gone up to 10.4%.  Discussed glucose and A1C goals. Discussed importance of checking CBGs and maintaining good CBG control to prevent long-term and short-term  complications. Encouraged patient to start checking glucose 1-2 times a day when he returns home and to follow up with Dr. Dareen Piano regarding DM control. Patient states he has all DM medications and supplies at home.  Patient verbalized understanding of information discussed and reports no further questions at this time related to diabetes.  Thanks, Orlando Penner, RN, MSN, CDE Diabetes Coordinator Inpatient Diabetes Program [phone] (Team Pager)

## 2021-11-08 NOTE — Progress Notes (Signed)
OT Cancellation Note  Patient Details Name: Aaron Sosa MRN: [id number] DOB: 05-Jun-1941   Cancelled Treatment:    Reason Eval/Treat Not Completed: Other (comment). Consult received, chart reviewed. Recent BP low (80's/50's). Will hold OT evaluation and re-attempt at later date/time as appropriate.   Arman Filter., MPH, MS, OTR/L ascom [phone] 11/08/21, 5:01 PM

## 2021-11-09 LAB — BASIC METABOLIC PANEL
Anion gap: 9 (ref 5–15)
BUN: 46 mg/dL — ABNORMAL HIGH (ref 8–23)
CO2: 28 mmol/L (ref 22–32)
Calcium: 8.4 mg/dL — ABNORMAL LOW (ref 8.9–10.3)
Chloride: 96 mmol/L — ABNORMAL LOW (ref 98–111)
Creatinine, Ser: 1.1 mg/dL (ref 0.61–1.24)
GFR, Estimated: >60 mL/min (ref >60)
Glucose, Bld: 158 mg/dL — ABNORMAL HIGH (ref 70–99)
Potassium: 3.3 mmol/L — ABNORMAL LOW (ref 3.5–5.1)
Sodium: 133 mmol/L — ABNORMAL LOW (ref 135–145)

## 2021-11-09 LAB — GLUCOSE, CAPILLARY
Glucose-Capillary: 191 mg/dL — ABNORMAL HIGH (ref 70–99)
Glucose-Capillary: 183 mg/dL — ABNORMAL HIGH (ref 70–99)
Glucose-Capillary: 250 mg/dL — ABNORMAL HIGH (ref 70–99)
Glucose-Capillary: 188 mg/dL — ABNORMAL HIGH (ref 70–99)

## 2021-11-09 MED ORDER — POTASSIUM CHLORIDE CRYS ER 20 MEQ PO TBCR
20.0000 meq | EXTENDED_RELEASE_TABLET | Freq: Two times a day (BID) | ORAL | Status: DC
  Administered 2021-11-09 – 2021-11-14 (×11): 20 meq via ORAL
  Filled 2021-11-09 (×11): qty 1

## 2021-11-09 MED ORDER — OXYCODONE HCL 5 MG PO TABS
5.0000 mg | ORAL_TABLET | ORAL | Status: DC | PRN
  Administered 2021-11-09: 10 mg via ORAL
  Filled 2021-11-09: qty 2

## 2021-11-09 MED ORDER — INSULIN ASPART 100 UNIT/ML IJ SOLN
3.0000 [IU] | Freq: Three times a day (TID) | INTRAMUSCULAR | Status: DC
  Administered 2021-11-09 – 2021-11-14 (×15): 3 [IU] via SUBCUTANEOUS
  Filled 2021-11-09 (×15): qty 1

## 2021-11-09 MED ORDER — INSULIN GLARGINE-YFGN 100 UNIT/ML ~~LOC~~ SOLN
12.0000 [IU] | Freq: Every day | SUBCUTANEOUS | Status: DC
  Administered 2021-11-09 – 2021-11-14 (×6): 12 [IU] via SUBCUTANEOUS
  Filled 2021-11-09 (×6): qty 0.12

## 2021-11-09 NOTE — Progress Notes (Signed)
PROGRESS NOTE    Aaron Sosa  SNK:[id number] DOB: Dec 01, 1940 DOA: 11/05/2021 PCP: Lauro Regulus, MD    Brief Narrative:  81 y.o. 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 They presented from home to the ED on 11/05/2021 with right hip subtotal nondisplaced fracture of superior and inferior right pubic rami after mechanical fall. In the ED, they were treated with analgesia as needed.  Ortho surgery was consulted and recommended nonsurgical management.  Patient was admitted to medicine service for further workup and management of hip fracture as outlined in detail below. Additionally, patient had suspected heart failure exacerbation new O2 requirement and hypokalemia on exam.  History reveals this is likely due to nonadherence with diuretic therapy.  He was treated with IV diuretics and home medications.   11/08/21 -stable, ORA. Medically ready for dc to SNF when bed available.    Assessment & Plan:   Principal Problem:   Acute on chronic combined systolic and diastolic CHF (congestive heart failure) (HCC) Active Problems:   Essential hypertension   Obesity (BMI 30.0-34.9)   Closed fracture of ramus of right pubis (HCC)   Leukocytosis   Hyponatremia   Type 2 diabetes mellitus with hyperglycemia (HCC)   Elevated troponin   Hypoxia   Shortness of breath  Acute hypoxic respiratory failure 2/2 HrEF exacerbation- (resolved) likely secondary to non-adherence to home therapies. Responded well to IV diuresis and is now ORA without respiratory complaint. Bps remain soft so home therapies continue to hold and can be added on as tolerated. Plan: Continue torsemide 40 mg daily Supplement potassium Strict ins and outs, daily weights Currently Entresto metoprolol on hold given soft pressures Monitor BP and restart as appropriate   Pubic ramus fracture, right, closed, initial encounter (HCC)- (present on admission). Awaiting SNF -  WBAT - Schedule acetaminophen - Low dose PRN oxycodone - Continue therapy evaluations   Elevated troponin- (present on admission) Suspect supply demand ischemia   Hyponatremia- (present on admission)- stable Na+ 129>131>130 - Fluid restriction - Cautious while on Lasix   Essential hypertension- (present on admission) BP soft -Hold Entresto, metoprolol   Type 2 diabetes mellitus with hyperglycemia (HCC)- (present on admission)- -Continue SS corrections -Semglee 12 units daily -NovoLog 3 units 3 times daily with meals -Hold home glipizide/metformin   Obesity (BMI 30.0-34.9)- (present on admission) BMI 32   Probably mild cognitive impairment - Outpatient follow-up    DVT prophylaxis: SQ Lovenox Code Status: Full Family Communication: None today Disposition Plan: Status is: Inpatient  Remains inpatient appropriate because: Unsafe discharge plan.  Patient medically stable for discharge at this time.  Pending placement at skilled nursing facility.       Level of care: Telemetry Cardiac  Consultants:  Ortho   Procedures:  None  Antimicrobials: None    Subjective: Seen and examined.  No SOB or CP.  Does complain of lower back and hip pain  Objective: Vitals:   11/08/21 2323 11/09/21 0229 11/09/21 0810 11/09/21 1146  BP: 102/70 104/71 112/79 102/69  Pulse: 92 90 (!) 102 82  Resp: 19 18 18 16   Temp: 97.8 F (36.6 C) 97.8 F (36.6 C) 97.6 F (36.4 C) 97.8 F (36.6 C)  TempSrc: Oral Oral Axillary Oral  SpO2: 92% 95% 98% 96%  Weight:      Height:        Intake/Output Summary (Last 24 hours) at 11/09/2021 1347 Last data filed at 11/09/2021 1010 Gross  per 24 hour  Intake 1320 ml  Output 1850 ml  Net -530 ml   Filed Weights   11/05/21 1032 11/08/21 0600  Weight: 96.6 kg 96.2 kg    Examination:  General exam: No acute distress Respiratory system: Bibasilar crackles.  Normal work of breathing.  Room air Cardiovascular system: Diminished heart  sounds.  Regular rate and rhythm.  No murmurs.  No pedal edema Gastrointestinal system: Abdomen is nondistended, soft and nontender. No organomegaly or masses felt. Normal bowel sounds heard. Central nervous system: Alert and oriented. No focal neurological deficits. Extremities: Symmetric 5 x 5 power. Skin: No rashes, lesions or ulcers Psychiatry: Judgement and insight appear normal. Mood & affect appropriate.     Data Reviewed: I have personally reviewed following labs and imaging studies  CBC: Recent Labs  Lab 11/05/21 1410 11/07/21 0709  WBC 13.9* 8.4  NEUTROABS 12.3*  --   HGB 14.6 13.5  HCT 44.5 41.7  MCV 85.7 84.4  PLT 224 171   Basic Metabolic Panel: Recent Labs  Lab 11/05/21 1410 11/07/21 0709 11/08/21 0613 11/09/21 0422  NA 129* 131* 130* 133*  K 4.2 3.1* 3.1* 3.3*  CL 89* 91* 89* 96*  CO2 26 28 30 28   GLUCOSE 239* 212* 193* 158*  BUN 48* 49* 48* 46*  CREATININE 1.24 1.23 1.17 1.10  CALCIUM 8.7* 8.2* 8.4* 8.4*   GFR: Estimated Creatinine Clearance: 60.2 mL/min (by C-G formula based on SCr of 1.1 mg/dL). Liver Function Tests: Recent Labs  Lab 11/05/21 1410  AST 34  ALT 21  ALKPHOS 83  BILITOT 2.6*  PROT 7.1  ALBUMIN 4.0   No results for input(s): LIPASE, AMYLASE in the last 168 hours. No results for input(s): AMMONIA in the last 168 hours. Coagulation Profile: No results for input(s): INR, PROTIME in the last 168 hours. Cardiac Enzymes: No results for input(s): CKTOTAL, CKMB, CKMBINDEX, TROPONINI in the last 168 hours. BNP (last 3 results) No results for input(s): PROBNP in the last 8760 hours. HbA1C: No results for input(s): HGBA1C in the last 72 hours. CBG: Recent Labs  Lab 11/08/21 1148 11/08/21 1632 11/08/21 2142 11/09/21 0813 11/09/21 1148  GLUCAP 214* 135* 214* 191* 183*   Lipid Profile: No results for input(s): CHOL, HDL, LDLCALC, TRIG, CHOLHDL, LDLDIRECT in the last 72 hours. Thyroid Function Tests: No results for input(s):  TSH, T4TOTAL, FREET4, T3FREE, THYROIDAB in the last 72 hours. Anemia Panel: No results for input(s): VITAMINB12, FOLATE, FERRITIN, TIBC, IRON, RETICCTPCT in the last 72 hours. Sepsis Labs: No results for input(s): PROCALCITON, LATICACIDVEN in the last 168 hours.  Recent Results (from the past 240 hour(s))  Resp Panel by RT-PCR (Flu A&B, Covid) Nasopharyngeal Swab     Status: None   Collection Time: 11/05/21  2:10 PM   Specimen: Nasopharyngeal Swab; Nasopharyngeal(NP) swabs in vial transport medium  Result Value Ref Range Status   SARS Coronavirus 2 by RT PCR NEGATIVE NEGATIVE Final    Comment: (NOTE) SARS-CoV-2 target nucleic acids are NOT DETECTED.  The SARS-CoV-2 RNA is generally detectable in upper respiratory specimens during the acute phase of infection. The lowest concentration of SARS-CoV-2 viral copies this assay can detect is 138 copies/mL. A negative result does not preclude SARS-Cov-2 infection and should not be used as the sole basis for treatment or other patient management decisions. A negative result may occur with  improper specimen collection/handling, submission of specimen other than nasopharyngeal swab, presence of viral mutation(s) within the areas targeted by this assay,  and inadequate number of viral copies(<138 copies/mL). A negative result must be combined with clinical observations, patient history, and epidemiological information. The expected result is Negative.  Fact Sheet for Patients:  BloggerCourse.com  Fact Sheet for Healthcare Providers:  SeriousBroker.it  This test is no t yet approved or cleared by the [place] FDA and  has been authorized for detection and/or diagnosis of SARS-CoV-2 by FDA under an Emergency Use Authorization (EUA). This EUA will remain  in effect (meaning this test can be used) for the duration of the COVID-19 declaration under Section 564(b)(1) of the Act,  21 U.S.C.section 360bbb-3(b)(1), unless the authorization is terminated  or revoked sooner.       Influenza A by PCR NEGATIVE NEGATIVE Final   Influenza B by PCR NEGATIVE NEGATIVE Final    Comment: (NOTE) The Xpert Xpress SARS-CoV-2/FLU/RSV plus assay is intended as an aid in the diagnosis of influenza from Nasopharyngeal swab specimens and should not be used as a sole basis for treatment. Nasal washings and aspirates are unacceptable for Xpert Xpress SARS-CoV-2/FLU/RSV testing.  Fact Sheet for Patients: BloggerCourse.com  Fact Sheet for Healthcare Providers: SeriousBroker.it  This test is not yet approved or cleared by the [place] FDA and has been authorized for detection and/or diagnosis of SARS-CoV-2 by FDA under an Emergency Use Authorization (EUA). This EUA will remain in effect (meaning this test can be used) for the duration of the COVID-19 declaration under Section 564(b)(1) of the Act, 21 U.S.C. section 360bbb-3(b)(1), unless the authorization is terminated or revoked.  Performed at [facility], [address], [place], [place] [address]          Radiology Studies: No results found.      Scheduled Meds:  acetaminophen  1,000 mg Oral TID   aspirin EC  81 mg Oral Daily   clopidogrel  75 mg Oral Daily   enoxaparin (LOVENOX) injection  0.5 mg/kg Subcutaneous Q24H   insulin aspart  0-15 Units Subcutaneous TID WC   insulin aspart  3 Units Subcutaneous TID WC   insulin glargine-yfgn  12 Units Subcutaneous Daily   potassium chloride  20 mEq Oral BID   rosuvastatin  20 mg Oral QHS   torsemide  40 mg Oral Daily   Continuous Infusions:   LOS: 3 days    Time spent: 35 minutes    Tresa Moore, MD [facility]   If 7PM-7AM, please contact night-coverage  11/09/2021, 1:47 PM

## 2021-11-09 NOTE — Evaluation (Addendum)
Occupational Therapy Evaluation Patient Details Name: Aaron Sosa MRN: [id number] DOB: [birth year] Today's Date: 11/09/2021   History of Present Illness Patient is a 81 year old male with HTN, sCHF EF 20-25%, DM, and CAD who presented with fall and right hip pain. Radiograph of hip showed no hip fracture but right inferior pubic ramus fracture with WBAT recommended. Also found to have acute on chronic combined systolic and diastolic CHF, elevated troponin suspected to be demand ischemia.   Clinical Impression   [name] was seen for OT evaluation this date. Prior to hospital admission, pt was MOD I for mobility using AD as needed. Pt lives alone. Pt presents to acute OT demonstrating impaired ADL performance and functional mobility 2/2 decreased activity tolerance and functional strength/ROM/balance deficits. Upon arrival pt seated on BSC, urine on floor. Assisted with changing socks/gown and bed linen change.   Pt currently requires MAX A don/doff B socks seated on BSC. MAX A perihygiene in stanidng. MAX A for BSC>bed squat pivot transfer. Pt left sitting on EOB with meal tray, bed alarm set, RN notified. Pt would benefit from skilled OT to address noted impairments and functional limitations (see below for any additional details). Upon hospital discharge, recommend STR to maximize pt safety and return to PLOF.       Recommendations for follow up therapy are one component of a multi-disciplinary discharge planning process, led by the attending physician.  Recommendations may be updated based on patient status, additional functional criteria and insurance authorization.   Follow Up Recommendations  Skilled nursing-short term rehab (<3 hours/day)    Assistance Recommended at Discharge Frequent or constant Supervision/Assistance  Patient can return home with the following Two people to help with walking and/or transfers;Two people to help with bathing/dressing/bathroom;Help with stairs or ramp  for entrance    Functional Status Assessment  Patient has had a recent decline in their functional status and demonstrates the ability to make significant improvements in function in a reasonable and predictable amount of time.  Equipment Recommendations  BSC/3in1    Recommendations for Other Services       Precautions / Restrictions Precautions Precautions: Fall Restrictions Weight Bearing Restrictions: Yes RLE Weight Bearing: Weight bearing as tolerated      Mobility Bed Mobility               General bed mobility comments: received and left in sitting    Transfers Overall transfer level: Needs assistance Equipment used: 1 person hand held assist Transfers: Sit to/from Stand;Bed to chair/wheelchair/BSC Sit to Stand: Mod assist   Squat pivot transfers: Max assist              Balance Overall balance assessment: Needs assistance Sitting-balance support: No upper extremity supported;Feet unsupported Sitting balance-Leahy Scale: Good     Standing balance support: Bilateral upper extremity supported;Reliant on assistive device for balance Standing balance-Leahy Scale: Poor                             ADL either performed or assessed with clinical judgement   ADL Overall ADL's : Needs assistance/impaired                                       General ADL Comments: MAX A don/doff B socks seated on BSC. MAX A perihygiene in stanidng. MAX A for BSC t/f.  Pertinent Vitals/Pain Pain Assessment: Faces Faces Pain Scale: Hurts little more Pain Location: R hip Pain Descriptors / Indicators: Grimacing;Discomfort Pain Intervention(s): Limited activity within patient's tolerance;Repositioned     Hand Dominance     Extremity/Trunk Assessment Upper Extremity Assessment Upper Extremity Assessment: Overall WFL for tasks assessed   Lower Extremity Assessment Lower Extremity Assessment: Generalized weakness        Communication Communication Communication: HOH   Cognition Arousal/Alertness: Awake/alert Behavior During Therapy: WFL for tasks assessed/performed Overall Cognitive Status: Within Functional Limits for tasks assessed                                        Home Living Family/patient expects to be discharged to:: Private residence Living Arrangements: Alone Available Help at Discharge: Family;Available PRN/intermittently Type of Home: House Home Access: Stairs to enter Entrance Stairs-Number of Steps: 5-6                   Home Equipment: Conservation officer, nature (2 wheels);Rollator (4 wheels);Cane - single point;Crutches          Prior Functioning/Environment Prior Level of Function : Independent/Modified Independent;History of Falls (last six months)                        OT Problem List: Decreased strength;Decreased range of motion;Decreased activity tolerance;Impaired balance (sitting and/or standing);Decreased safety awareness;Pain      OT Treatment/Interventions: Self-care/ADL training;Therapeutic exercise;Energy conservation;DME and/or AE instruction;Therapeutic activities;Balance training;Patient/family education    OT Goals(Current goals can be found in the care plan section) Acute Rehab OT Goals Patient Stated Goal: to improve pain OT Goal Formulation: With patient Time For Goal Achievement: 11/23/21 Potential to Achieve Goals: Good ADL Goals Pt Will Perform Grooming: with min assist;standing (c LRAD PRN) Pt Will Transfer to Toilet: with min assist;stand pivot transfer;bedside commode (c LRAD PRN) Pt Will Perform Toileting - Clothing Manipulation and hygiene: with min assist;sitting/lateral leans  OT Frequency: Min 2X/week    Co-evaluation              AM-PAC OT "6 Clicks" Daily Activity     Outcome Measure Help from another person eating meals?: None Help from another person taking care of personal grooming?: A Little Help from  another person toileting, which includes using toliet, bedpan, or urinal?: A Lot Help from another person bathing (including washing, rinsing, drying)?: A Lot Help from another person to put on and taking off regular upper body clothing?: A Little Help from another person to put on and taking off regular lower body clothing?: A Lot 6 Click Score: 16   End of Session Nurse Communication: Mobility status  Activity Tolerance: Patient tolerated treatment well Patient left: in bed;with call bell/phone within reach;with bed alarm set  OT Visit Diagnosis: Unsteadiness on feet (R26.81);Other abnormalities of gait and mobility (R26.89)                Time: [id number] OT Time Calculation (min): 20 min Charges:  OT General Charges $OT Visit: 1 Visit OT Evaluation $OT Eval Low Complexity: 1 Low OT Treatments $Self Care/Home Management : 8-22 mins  Dessie Coma, M.S. OTR/L  11/09/21, 4:06 PM  ascom [phone]

## 2021-11-09 NOTE — Progress Notes (Signed)
Patient ID: Aaron Sosa, male   DOB: Mar 24, 1941, 81 y.o.   MRN: [id number]  Subjective: The patient notes some improvement in his right hip symptoms.  He was able to stand and take several small steps with physical therapy yesterday.  He has no new complaints regarding his right hip.   Objective: Vital signs in last 24 hours: Temp:  [96.5 F (35.8 C)-98.1 F (36.7 C)] 97.8 F (36.6 C) (01/11 0229) Pulse Rate:  [83-92] 90 (01/11 0229) Resp:  [18-19] 18 (01/11 0229) BP: (85-106)/(59-71) 104/71 (01/11 0229) SpO2:  [92 %-99 %] 95 % (01/11 0229)  Intake/Output from previous day: 01/10 0701 - 01/11 0700 In: 1080 [P.O.:1080] Out: 2350 [Urine:2350] Intake/Output this shift: No intake/output data recorded.  Recent Labs    11/07/21 0709  HGB 13.5   Recent Labs    11/07/21 0709  WBC 8.4  RBC 4.94  HCT 41.7  PLT 171   Recent Labs    11/08/21 0613 11/09/21 0422  NA 130* 133*  K 3.1* 3.3*  CL 89* 96*  CO2 30 28  BUN 48* 46*  CREATININE 1.17 1.10  GLUCOSE 193* 158*  CALCIUM 8.4* 8.4*   No results for input(s): LABPT, INR in the last 72 hours.  Physical Exam: Orthopedic examination again is limited to the right hip and lower extremity.  There is no tenderness to palpation over the anterior lateral aspects of the right hip.  Skin inspection of the right hip is unremarkable.  No swelling, erythema, ecchymosis, abrasions, or other skin abnormalities are identified.  He still has mild-moderate pain with attempted active flexion or active motion of the hip or lower extremity, but has no pain with logrolling of the hip.  He is neurovascularly intact to the right lower extremity and foot.  Assessment: Nondisplaced right superior and inferior pubic rami fractures.  Plan: Continue to mobilize the patient with physical therapy as symptoms permit, weightbearing as tolerated on the right lower extremity.  Most likely, the patient will require rehab placement.  I will sign off at this  time.  Please make arrangements for the patient to follow-up in my office and 5 to 6 weeks for re-evaluation.  If you have further need of orthopedic input during this hospitalization, please reconsult me.   Excell Seltzer Leib Elahi 11/09/2021, 7:17 AM

## 2021-11-09 NOTE — Care Management Important Message (Signed)
Important Message  Patient Details  Name: Aaron Sosa MRN: [id number] Date of Birth: Dec 21, 1940   Medicare Important Message Given:  Yes     Dannette Barbara 11/09/2021, 11:10 AM

## 2021-11-10 LAB — GLUCOSE, CAPILLARY
Glucose-Capillary: 151 mg/dL — ABNORMAL HIGH (ref 70–99)
Glucose-Capillary: 218 mg/dL — ABNORMAL HIGH (ref 70–99)
Glucose-Capillary: 138 mg/dL — ABNORMAL HIGH (ref 70–99)
Glucose-Capillary: 102 mg/dL — ABNORMAL HIGH (ref 70–99)

## 2021-11-10 MED ORDER — GABAPENTIN 100 MG PO CAPS
200.0000 mg | ORAL_CAPSULE | Freq: Two times a day (BID) | ORAL | Status: DC
  Administered 2021-11-10 (×2): 200 mg via ORAL
  Filled 2021-11-10 (×2): qty 2

## 2021-11-10 MED ORDER — OXYCODONE HCL 5 MG PO TABS
5.0000 mg | ORAL_TABLET | ORAL | Status: DC | PRN
  Administered 2021-11-10: 5 mg via ORAL
  Filled 2021-11-10: qty 1

## 2021-11-10 NOTE — Progress Notes (Signed)
Physical Therapy Treatment Patient Details Name: Aaron Sosa MRN: [id number] DOB: 1941/07/10 Today's Date: 11/10/2021   History of Present Illness Patient is a 81 year old male with HTN, sCHF EF 20-25%, DM, and CAD who presented with fall and right hip pain. Radiograph of hip showed no hip fracture but right inferior pubic ramus fracture with WBAT recommended. Also found to have acute on chronic combined systolic and diastolic CHF, elevated troponin suspected to be demand ischemia.    PT Comments    Patient alert, agreeable to PT with minimal motivation. Pt very fear dominant, anxious and anticipating pain, but did state at end of session today was better than he thought it would be. Supine to sit pt still reliant on assistance for RLE, but much improved ability for patient to perform trunk elevation. Good sitting balance noted. Sit <> stand still requiring modA and RW, as well as modA to take several steps to recliner in room, pt needed encouragement to complete transfer. Up in chair all needs in reach. The patient would benefit from further skilled PT intervention to continue to progress towards goals. Recommendation remains appropriate.       Recommendations for follow up therapy are one component of a multi-disciplinary discharge planning process, led by the attending physician.  Recommendations may be updated based on patient status, additional functional criteria and insurance authorization.  Follow Up Recommendations  Skilled nursing-short term rehab (<3 hours/day)     Assistance Recommended at Discharge Frequent or constant Supervision/Assistance  Patient can return home with the following A lot of help with walking and/or transfers;Two people to help with bathing/dressing/bathroom;Assistance with cooking/housework;Assist for transportation;Help with stairs or ramp for entrance   Equipment Recommendations  None recommended by PT    Recommendations for Other Services OT  consult     Precautions / Restrictions Precautions Precautions: Fall Restrictions Weight Bearing Restrictions: Yes RLE Weight Bearing: Weight bearing as tolerated     Mobility  Bed Mobility Overal bed mobility: Needs Assistance Bed Mobility: Supine to Sit;Sit to Supine     Supine to sit: Min assist     General bed mobility comments: initially minA just for RLE, much improved ability to pull on bed rails today, CGA    Transfers Overall transfer level: Needs assistance   Transfers: Sit to/from Stand;Bed to chair/wheelchair/BSC Sit to Stand: Mod assist   Squat pivot transfers: Mod assist       General transfer comment: able to take 3-4 steps towards recliner in room, modA to off weight RLE and cued for proper RW weight bearing    Ambulation/Gait                   Stairs             Wheelchair Mobility    Modified Rankin (Stroke Patients Only)       Balance Overall balance assessment: Needs assistance Sitting-balance support: No upper extremity supported;Feet unsupported Sitting balance-Leahy Scale: Good     Standing balance support: Bilateral upper extremity supported;Reliant on assistive device for balance Standing balance-Leahy Scale: Poor                              Cognition Arousal/Alertness: Awake/alert Behavior During Therapy: WFL for tasks assessed/performed Overall Cognitive Status: Within Functional Limits for tasks assessed  Exercises Other Exercises Other Exercises: heel slides on RLE x10 AAROM, AROM on LLE. LAQ on R x 10 in sitting    General Comments        Pertinent Vitals/Pain Pain Assessment: Faces Faces Pain Scale: Hurts a little bit Pain Location: R hip Pain Descriptors / Indicators: Grimacing;Discomfort Pain Intervention(s): Limited activity within patient's tolerance;Repositioned;Monitored during session    Home Living                           Prior Function            PT Goals (current goals can now be found in the care plan section) Progress towards PT goals: Progressing toward goals    Frequency    Min 2X/week      PT Plan Current plan remains appropriate    Co-evaluation              AM-PAC PT "6 Clicks" Mobility   Outcome Measure  Help needed turning from your back to your side while in a flat bed without using bedrails?: A Lot Help needed moving from lying on your back to sitting on the side of a flat bed without using bedrails?: A Lot Help needed moving to and from a bed to a chair (including a wheelchair)?: Total Help needed standing up from a chair using your arms (e.g., wheelchair or bedside chair)?: A Lot Help needed to walk in hospital room?: Total Help needed climbing 3-5 steps with a railing? : Total 6 Click Score: 9    End of Session Equipment Utilized During Treatment: Gait belt Activity Tolerance: Patient limited by pain Patient left: with call bell/phone within reach;in chair;with chair alarm set Nurse Communication: Mobility status PT Visit Diagnosis: Pain;Other abnormalities of gait and mobility (R26.89);Difficulty in walking, not elsewhere classified (R26.2) Pain - Right/Left: Right Pain - part of body: Hip     Time: 9407-6808 PT Time Calculation (min) (ACUTE ONLY): 25 min  Charges:  $Therapeutic Exercise: 8-22 mins $Therapeutic Activity: 8-22 mins                     Olga Coaster PT, DPT 11:45 AM,11/10/21

## 2021-11-10 NOTE — Progress Notes (Signed)
PROGRESS NOTE    LAIDEN MILLES  IDP:[id number] DOB: 1940-11-07 DOA: 11/05/2021 PCP: Lauro Regulus, MD    Brief Narrative:  81 y.o. 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 They presented from home to the ED on 11/05/2021 with right hip subtotal nondisplaced fracture of superior and inferior right pubic rami after mechanical fall. In the ED, they were treated with analgesia as needed.  Ortho surgery was consulted and recommended nonsurgical management.  Patient was admitted to medicine service for further workup and management of hip fracture as outlined in detail below. Additionally, patient had suspected heart failure exacerbation new O2 requirement and hypokalemia on exam.  History reveals this is likely due to nonadherence with diuretic therapy.  He was treated with IV diuretics and home medications.   11/08/21 -stable, ORA. Medically ready for dc to SNF when bed available.    Assessment & Plan:   Principal Problem:   Acute on chronic combined systolic and diastolic CHF (congestive heart failure) (HCC) Active Problems:   Essential hypertension   Obesity (BMI 30.0-34.9)   Closed fracture of ramus of right pubis (HCC)   Leukocytosis   Hyponatremia   Type 2 diabetes mellitus with hyperglycemia (HCC)   Elevated troponin   Hypoxia   Shortness of breath  Acute hypoxic respiratory failure 2/2 HrEF exacerbation- (resolved) likely secondary to non-adherence to home therapies. Responded well to IV diuresis and is now ORA without respiratory complaint. Bps remain soft so home therapies continue to hold and can be added on as tolerated. Plan: Continue torsemide 40 mg daily Supplement potassium Strict ins and outs, daily weights Currently Entresto metoprolol on hold given soft pressures Monitor BP and restart as appropriate   Pubic ramus fracture, right, closed, initial encounter (HCC)- (present on admission). Awaiting SNF -  WBAT - Schedule acetaminophen -Scheduled gabapentin -Low-dose narcotics for breakthrough - Continue therapy evaluations   Elevated troponin- (present on admission) Suspect supply demand ischemia   Hyponatremia- (present on admission)- stable Na+ 129>131>130 - Fluid restriction - Cautious while on Lasix   Essential hypertension- (present on admission) BP soft -Hold Entresto, metoprolol   Type 2 diabetes mellitus with hyperglycemia (HCC)- (present on admission)- -Continue SS corrections -Semglee 12 units daily -NovoLog 3 units 3 times daily with meals -Hold home glipizide/metformin   Obesity (BMI 30.0-34.9)- (present on admission) BMI 32   Probably mild cognitive impairment - Outpatient follow-up    DVT prophylaxis: SQ Lovenox Code Status: Full Family Communication: None today Disposition Plan: Status is: Inpatient  Remains inpatient appropriate because: Unsafe discharge plan.  Patient medically stable for discharge at this time.  Pending placement at skilled nursing facility.       Level of care: Telemetry Cardiac  Consultants:  Ortho   Procedures:  None  Antimicrobials: None    Subjective: Seen and examined.  No SOB or CP.  Does complain of lower back and hip pain.  Endorses that the narcotics given yesterday may have been too strong for him  Objective: Vitals:   11/09/21 2110 11/09/21 2346 11/10/21 0412 11/10/21 0734  BP: 94/61 (!) 93/59 102/72 106/73  Pulse: 87 85 89 84  Resp: 18 18 16 16   Temp: 97.7 F (36.5 C) (!) 97.4 F (36.3 C) 98 F (36.7 C) 97.9 F (36.6 C)  TempSrc: Oral Oral Oral Oral  SpO2: 98% 98% 99% 99%  Weight:   95.5 kg   Height:  Intake/Output Summary (Last 24 hours) at 11/10/2021 1123 Last data filed at 11/10/2021 1000 Gross per 24 hour  Intake 600 ml  Output 975 ml  Net -375 ml   Filed Weights   11/05/21 1032 11/08/21 0600 11/10/21 0412  Weight: 96.6 kg 96.2 kg 95.5 kg    Examination:  General exam: No  acute distress Respiratory system: Bibasilar crackles.  Normal work of breathing.  Room air Cardiovascular system: Diminished heart sounds.  Regular rate and rhythm.  No murmurs.  No pedal edema Gastrointestinal system: Abdomen is nondistended, soft and nontender. No organomegaly or masses felt. Normal bowel sounds heard. Central nervous system: Alert and oriented. No focal neurological deficits. Extremities: Symmetric 5 x 5 power. Skin: No rashes, lesions or ulcers Psychiatry: Judgement and insight appear normal. Mood & affect appropriate.     Data Reviewed: I have personally reviewed following labs and imaging studies  CBC: Recent Labs  Lab 11/05/21 1410 11/07/21 0709  WBC 13.9* 8.4  NEUTROABS 12.3*  --   HGB 14.6 13.5  HCT 44.5 41.7  MCV 85.7 84.4  PLT 224 171   Basic Metabolic Panel: Recent Labs  Lab 11/05/21 1410 11/07/21 0709 11/08/21 0613 11/09/21 0422  NA 129* 131* 130* 133*  K 4.2 3.1* 3.1* 3.3*  CL 89* 91* 89* 96*  CO2 26 28 30 28   GLUCOSE 239* 212* 193* 158*  BUN 48* 49* 48* 46*  CREATININE 1.24 1.23 1.17 1.10  CALCIUM 8.7* 8.2* 8.4* 8.4*   GFR: Estimated Creatinine Clearance: 60 mL/min (by C-G formula based on SCr of 1.1 mg/dL). Liver Function Tests: Recent Labs  Lab 11/05/21 1410  AST 34  ALT 21  ALKPHOS 83  BILITOT 2.6*  PROT 7.1  ALBUMIN 4.0   No results for input(s): LIPASE, AMYLASE in the last 168 hours. No results for input(s): AMMONIA in the last 168 hours. Coagulation Profile: No results for input(s): INR, PROTIME in the last 168 hours. Cardiac Enzymes: No results for input(s): CKTOTAL, CKMB, CKMBINDEX, TROPONINI in the last 168 hours. BNP (last 3 results) No results for input(s): PROBNP in the last 8760 hours. HbA1C: No results for input(s): HGBA1C in the last 72 hours. CBG: Recent Labs  Lab 11/09/21 0813 11/09/21 1148 11/09/21 1710 11/09/21 2111 11/10/21 0735  GLUCAP 191* 183* 250* 188* 151*   Lipid Profile: No results  for input(s): CHOL, HDL, LDLCALC, TRIG, CHOLHDL, LDLDIRECT in the last 72 hours. Thyroid Function Tests: No results for input(s): TSH, T4TOTAL, FREET4, T3FREE, THYROIDAB in the last 72 hours. Anemia Panel: No results for input(s): VITAMINB12, FOLATE, FERRITIN, TIBC, IRON, RETICCTPCT in the last 72 hours. Sepsis Labs: No results for input(s): PROCALCITON, LATICACIDVEN in the last 168 hours.  Recent Results (from the past 240 hour(s))  Resp Panel by RT-PCR (Flu A&B, Covid) Nasopharyngeal Swab     Status: None   Collection Time: 11/05/21  2:10 PM   Specimen: Nasopharyngeal Swab; Nasopharyngeal(NP) swabs in vial transport medium  Result Value Ref Range Status   SARS Coronavirus 2 by RT PCR NEGATIVE NEGATIVE Final    Comment: (NOTE) SARS-CoV-2 target nucleic acids are NOT DETECTED.  The SARS-CoV-2 RNA is generally detectable in upper respiratory specimens during the acute phase of infection. The lowest concentration of SARS-CoV-2 viral copies this assay can detect is 138 copies/mL. A negative result does not preclude SARS-Cov-2 infection and should not be used as the sole basis for treatment or other patient management decisions. A negative result may occur with  improper specimen  collection/handling, submission of specimen other than nasopharyngeal swab, presence of viral mutation(s) within the areas targeted by this assay, and inadequate number of viral copies(<138 copies/mL). A negative result must be combined with clinical observations, patient history, and epidemiological information. The expected result is Negative.  Fact Sheet for Patients:  BloggerCourse.com  Fact Sheet for Healthcare Providers:  SeriousBroker.it  This test is no t yet approved or cleared by the [place] FDA and  has been authorized for detection and/or diagnosis of SARS-CoV-2 by FDA under an Emergency Use Authorization (EUA). This EUA will remain  in  effect (meaning this test can be used) for the duration of the COVID-19 declaration under Section 564(b)(1) of the Act, 21 U.S.C.section 360bbb-3(b)(1), unless the authorization is terminated  or revoked sooner.       Influenza A by PCR NEGATIVE NEGATIVE Final   Influenza B by PCR NEGATIVE NEGATIVE Final    Comment: (NOTE) The Xpert Xpress SARS-CoV-2/FLU/RSV plus assay is intended as an aid in the diagnosis of influenza from Nasopharyngeal swab specimens and should not be used as a sole basis for treatment. Nasal washings and aspirates are unacceptable for Xpert Xpress SARS-CoV-2/FLU/RSV testing.  Fact Sheet for Patients: BloggerCourse.com  Fact Sheet for Healthcare Providers: SeriousBroker.it  This test is not yet approved or cleared by the [place] FDA and has been authorized for detection and/or diagnosis of SARS-CoV-2 by FDA under an Emergency Use Authorization (EUA). This EUA will remain in effect (meaning this test can be used) for the duration of the COVID-19 declaration under Section 564(b)(1) of the Act, 21 U.S.C. section 360bbb-3(b)(1), unless the authorization is terminated or revoked.  Performed at [facility], [address] [place], [place] [address]          Radiology Studies: No results found.      Scheduled Meds:  acetaminophen  1,000 mg Oral TID   aspirin EC  81 mg Oral Daily   clopidogrel  75 mg Oral Daily   enoxaparin (LOVENOX) injection  0.5 mg/kg Subcutaneous Q24H   gabapentin  200 mg Oral BID   insulin aspart  0-15 Units Subcutaneous TID WC   insulin aspart  3 Units Subcutaneous TID WC   insulin glargine-yfgn  12 Units Subcutaneous Daily   potassium chloride  20 mEq Oral BID   rosuvastatin  20 mg Oral QHS   torsemide  40 mg Oral Daily   Continuous Infusions:   LOS: 4 days    Time spent: 25 minutes    Tresa Moore, MD [facility]   If 7PM-7AM,  please contact night-coverage  11/10/2021, 11:23 AM

## 2021-11-10 NOTE — TOC Progression Note (Signed)
Transition of Care [facility]) - Progression Note    Patient Details  Name: Aaron Sosa MRN: [id number] Date of Birth: 06/27/1941  Transition of Care [facility]) CM/SW Contact  Eileen Stanford, LCSW Phone Number: 11/10/2021, 1:23 PM  Clinical Narrative:   No bed offers. CSW has expanded search to [place] facilities.    Expected Discharge Plan: [place] Barriers to Discharge: Continued Medical Work up  Expected Discharge Plan and Services Expected Discharge Plan: [place]   Discharge Planning Services: CM Consult Post Acute Care Choice: [place] Living arrangements for the past 2 months: Single Family Home                 DME Arranged: N/A DME Agency: NA       HH Arranged: NA HH Agency: NA         Social Determinants of Health (SDOH) Interventions    Readmission Risk Interventions No flowsheet data found.

## 2021-11-10 NOTE — Progress Notes (Addendum)
Mobility Specialist - Progress Note   11/10/21 1600  Mobility  Activity Transferred:  Chair to bed  Level of Assistance Moderate assist, patient does 50-74%  Assistive Device Front wheel walker  Distance Ambulated (ft) 2 ft  Mobility Sit up in bed/chair position for meals  Mobility Response Tolerated well  Mobility performed by Mobility specialist  $Mobility charge 1 Mobility     Pt sitting in recliner upon arrival, utilizing RA. Pt required extra time and use of momentum to stand with minA. Pt took several short steps towards bed with VC for UE engagement and weight-shifting to maintain WB protocols. ModA +1, +2 for safety. Pain 6-7/10 with activity. Assist for LE support to return supine. Pt left in bed with alarm set, needs in reach. Family at bedside.    Filiberto Pinks Mobility Specialist 11/10/21, 5:01 PM

## 2021-11-11 LAB — GLUCOSE, CAPILLARY
Glucose-Capillary: 153 mg/dL — ABNORMAL HIGH (ref 70–99)
Glucose-Capillary: 216 mg/dL — ABNORMAL HIGH (ref 70–99)
Glucose-Capillary: 162 mg/dL — ABNORMAL HIGH (ref 70–99)
Glucose-Capillary: 82 mg/dL (ref 70–99)

## 2021-11-11 LAB — CBC
HCT: 38.4 % — ABNORMAL LOW (ref 39.0–52.0)
Hemoglobin: 12.8 g/dL — ABNORMAL LOW (ref 13.0–17.0)
MCH: 27.9 pg (ref 26.0–34.0)
MCHC: 33.3 g/dL (ref 30.0–36.0)
MCV: 83.7 fL (ref 80.0–100.0)
Platelets: 214 10*3/uL (ref 150–400)
RBC: 4.59 MIL/uL (ref 4.22–5.81)
RDW: 14.6 % (ref 11.5–15.5)
WBC: 7.8 10*3/uL (ref 4.0–10.5)
nRBC: 0 % (ref 0.0–0.2)

## 2021-11-11 MED ORDER — GABAPENTIN 300 MG PO CAPS
300.0000 mg | ORAL_CAPSULE | Freq: Three times a day (TID) | ORAL | Status: DC
  Administered 2021-11-11 – 2021-11-14 (×10): 300 mg via ORAL
  Filled 2021-11-11 (×10): qty 1

## 2021-11-11 NOTE — TOC Progression Note (Signed)
Transition of Care [facility]) - Progression Note    Patient Details  Name: PINCHUS WECKWERTH MRN: [id number] Date of Birth: 08-Aug-1941  Transition of Care [name]) CM/SW Contact  Maree Krabbe, LCSW Phone Number: 11/11/2021, 3:52 PM  Clinical Narrative:   Pt has two bed offers in [place]. Pt was not happy about them being in [place]. Pt ask that CSW call daughter as she would be driving the decision. CSW called Dewayne Hatch- left voicemail.    Expected Discharge Plan: Skilled Nursing Facility Barriers to Discharge: Continued Medical Work up  Expected Discharge Plan and Services Expected Discharge Plan: Skilled Nursing Facility   Discharge Planning Services: CM Consult Post Acute Care Choice: Skilled Nursing Facility Living arrangements for the past 2 months: Single Family Home                 DME Arranged: N/A DME Agency: NA       HH Arranged: NA HH Agency: NA         Social Determinants of Health (SDOH) Interventions    Readmission Risk Interventions No flowsheet data found.

## 2021-11-11 NOTE — Progress Notes (Signed)
PROGRESS NOTE    Aaron Sosa  WCB:[id number] DOB: September 07, 1941 DOA: 11/05/2021 PCP: Lauro Regulus, MD    Brief Narrative:  81 y.o. 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 They presented from home to the ED on 11/05/2021 with right hip subtotal nondisplaced fracture of superior and inferior right pubic rami after mechanical fall. In the ED, they were treated with analgesia as needed.  Ortho surgery was consulted and recommended nonsurgical management.  Patient was admitted to medicine service for further workup and management of hip fracture as outlined in detail below. Additionally, patient had suspected heart failure exacerbation new O2 requirement and hypokalemia on exam.  History reveals this is likely due to nonadherence with diuretic therapy.  He was treated with IV diuretics and home medications.   11/08/21 -stable, ORA. Medically ready for dc to SNF when bed available.    Assessment & Plan:   Principal Problem:   Acute on chronic combined systolic and diastolic CHF (congestive heart failure) (HCC) Active Problems:   Essential hypertension   Obesity (BMI 30.0-34.9)   Closed fracture of ramus of right pubis (HCC)   Leukocytosis   Hyponatremia   Type 2 diabetes mellitus with hyperglycemia (HCC)   Elevated troponin   Hypoxia   Shortness of breath  Acute hypoxic respiratory failure 2/2 HrEF exacerbation- (resolved) likely secondary to non-adherence to home therapies. Responded well to IV diuresis and is now ORA without respiratory complaint. Bps remain soft so home therapies continue to hold and can be added on as tolerated. Plan: Continue torsemide 40 mg daily Supplement potassium as needed Strict ins and outs, daily weights Currently Entresto metoprolol on hold given soft pressures Monitor BP and restart as appropriate   Pubic ramus fracture, right, closed, initial encounter (HCC)- (present on admission).  Awaiting SNF - WBAT - Schedule acetaminophen -Scheduled gabapentin -Low-dose narcotics for breakthrough - Continue therapy evaluations   Elevated troponin- (present on admission) Suspect supply demand ischemia   Hyponatremia- (present on admission)- stable Na+ 129>131>130 - Fluid restriction - Cautious while on Lasix   Essential hypertension- (present on admission) BP soft -Hold Entresto, metoprolol   Type 2 diabetes mellitus with hyperglycemia (HCC)- (present on admission)- -Continue SS corrections -Semglee 12 units daily -NovoLog 3 units 3 times daily with meals -Hold home glipizide/metformin   Obesity (BMI 30.0-34.9)- (present on admission) BMI 32   Probably mild cognitive impairment - Outpatient follow-up    DVT prophylaxis: SQ Lovenox Code Status: Full Family Communication: None today Disposition Plan: Status is: Inpatient  Remains inpatient appropriate because: Unsafe discharge plan.  Patient medically stable for discharge at this time.  Pending placement at skilled nursing facility.       Level of care: Telemetry Cardiac  Consultants:  Ortho   Procedures:  None  Antimicrobials: None    Subjective: Seen and examined.  No SOB or CP.  Does complain of lower back and hip pain.  Endorses insufficient pain control  Objective: Vitals:   11/11/21 0404 11/11/21 0500 11/11/21 0829 11/11/21 1100  BP: 111/80  106/82 108/74  Pulse: 84  82 86  Resp: 16  17 17   Temp: 98.3 F (36.8 C)  98.2 F (36.8 C) 98.1 F (36.7 C)  TempSrc: Oral  Oral Oral  SpO2: 95%  96% 96%  Weight:  95.3 kg    Height:        Intake/Output Summary (Last 24 hours) at 11/11/2021 1328 Last data  filed at 11/11/2021 1202 Gross per 24 hour  Intake 1700 ml  Output 1450 ml  Net 250 ml   Filed Weights   11/08/21 0600 11/10/21 0412 11/11/21 0500  Weight: 96.2 kg 95.5 kg 95.3 kg    Examination:  General exam: No acute distress Respiratory system: Bibasilar crackles.  Normal  work of breathing.  Room air Cardiovascular system: Regular rate and rhythm, no murmurs, no pedal edema Gastrointestinal system: Abdomen is nondistended, soft and nontender. No organomegaly or masses felt. Normal bowel sounds heard. Central nervous system: Alert and oriented. No focal neurological deficits. Extremities: Decreased power LLE Skin: No rashes, lesions or ulcers Psychiatry: Judgement and insight appear normal. Mood & affect appropriate.     Data Reviewed: I have personally reviewed following labs and imaging studies  CBC: Recent Labs  Lab 11/05/21 1410 11/07/21 0709 11/11/21 0607  WBC 13.9* 8.4 7.8  NEUTROABS 12.3*  --   --   HGB 14.6 13.5 12.8*  HCT 44.5 41.7 38.4*  MCV 85.7 84.4 83.7  PLT 224 171 214   Basic Metabolic Panel: Recent Labs  Lab 11/05/21 1410 11/07/21 0709 11/08/21 0613 11/09/21 0422  NA 129* 131* 130* 133*  K 4.2 3.1* 3.1* 3.3*  CL 89* 91* 89* 96*  CO2 26 28 30 28   GLUCOSE 239* 212* 193* 158*  BUN 48* 49* 48* 46*  CREATININE 1.24 1.23 1.17 1.10  CALCIUM 8.7* 8.2* 8.4* 8.4*   GFR: Estimated Creatinine Clearance: 60 mL/min (by C-G formula based on SCr of 1.1 mg/dL). Liver Function Tests: Recent Labs  Lab 11/05/21 1410  AST 34  ALT 21  ALKPHOS 83  BILITOT 2.6*  PROT 7.1  ALBUMIN 4.0   No results for input(s): LIPASE, AMYLASE in the last 168 hours. No results for input(s): AMMONIA in the last 168 hours. Coagulation Profile: No results for input(s): INR, PROTIME in the last 168 hours. Cardiac Enzymes: No results for input(s): CKTOTAL, CKMB, CKMBINDEX, TROPONINI in the last 168 hours. BNP (last 3 results) No results for input(s): PROBNP in the last 8760 hours. HbA1C: No results for input(s): HGBA1C in the last 72 hours. CBG: Recent Labs  Lab 11/10/21 1145 11/10/21 1737 11/10/21 2048 11/11/21 0829 11/11/21 1201  GLUCAP 218* 138* 102* 153* 216*   Lipid Profile: No results for input(s): CHOL, HDL, LDLCALC, TRIG, CHOLHDL,  LDLDIRECT in the last 72 hours. Thyroid Function Tests: No results for input(s): TSH, T4TOTAL, FREET4, T3FREE, THYROIDAB in the last 72 hours. Anemia Panel: No results for input(s): VITAMINB12, FOLATE, FERRITIN, TIBC, IRON, RETICCTPCT in the last 72 hours. Sepsis Labs: No results for input(s): PROCALCITON, LATICACIDVEN in the last 168 hours.  Recent Results (from the past 240 hour(s))  Resp Panel by RT-PCR (Flu A&B, Covid) Nasopharyngeal Swab     Status: None   Collection Time: 11/05/21  2:10 PM   Specimen: Nasopharyngeal Swab; Nasopharyngeal(NP) swabs in vial transport medium  Result Value Ref Range Status   SARS Coronavirus 2 by RT PCR NEGATIVE NEGATIVE Final    Comment: (NOTE) SARS-CoV-2 target nucleic acids are NOT DETECTED.  The SARS-CoV-2 RNA is generally detectable in upper respiratory specimens during the acute phase of infection. The lowest concentration of SARS-CoV-2 viral copies this assay can detect is 138 copies/mL. A negative result does not preclude SARS-Cov-2 infection and should not be used as the sole basis for treatment or other patient management decisions. A negative result may occur with  improper specimen collection/handling, submission of specimen other than nasopharyngeal swab,  presence of viral mutation(s) within the areas targeted by this assay, and inadequate number of viral copies(<138 copies/mL). A negative result must be combined with clinical observations, patient history, and epidemiological information. The expected result is Negative.  Fact Sheet for Patients:  BloggerCourse.com  Fact Sheet for Healthcare Providers:  SeriousBroker.it  This test is no t yet approved or cleared by the [place] FDA and  has been authorized for detection and/or diagnosis of SARS-CoV-2 by FDA under an Emergency Use Authorization (EUA). This EUA will remain  in effect (meaning this test can be used) for the  duration of the COVID-19 declaration under Section 564(b)(1) of the Act, 21 U.S.C.section 360bbb-3(b)(1), unless the authorization is terminated  or revoked sooner.       Influenza A by PCR NEGATIVE NEGATIVE Final   Influenza B by PCR NEGATIVE NEGATIVE Final    Comment: (NOTE) The Xpert Xpress SARS-CoV-2/FLU/RSV plus assay is intended as an aid in the diagnosis of influenza from Nasopharyngeal swab specimens and should not be used as a sole basis for treatment. Nasal washings and aspirates are unacceptable for Xpert Xpress SARS-CoV-2/FLU/RSV testing.  Fact Sheet for Patients: BloggerCourse.com  Fact Sheet for Healthcare Providers: SeriousBroker.it  This test is not yet approved or cleared by the [place] FDA and has been authorized for detection and/or diagnosis of SARS-CoV-2 by FDA under an Emergency Use Authorization (EUA). This EUA will remain in effect (meaning this test can be used) for the duration of the COVID-19 declaration under Section 564(b)(1) of the Act, 21 U.S.C. section 360bbb-3(b)(1), unless the authorization is terminated or revoked.  Performed at [name], [address], [place], [place] [address]          Radiology Studies: No results found.      Scheduled Meds:  acetaminophen  1,000 mg Oral TID   aspirin EC  81 mg Oral Daily   clopidogrel  75 mg Oral Daily   enoxaparin (LOVENOX) injection  0.5 mg/kg Subcutaneous Q24H   gabapentin  300 mg Oral TID   insulin aspart  0-15 Units Subcutaneous TID WC   insulin aspart  3 Units Subcutaneous TID WC   insulin glargine-yfgn  12 Units Subcutaneous Daily   potassium chloride  20 mEq Oral BID   rosuvastatin  20 mg Oral QHS   torsemide  40 mg Oral Daily   Continuous Infusions:   LOS: 5 days    Time spent: 25 minutes    Tresa Moore, MD [facility]   If 7PM-7AM, please contact night-coverage  11/11/2021, 1:28  PM

## 2021-11-11 NOTE — Care Management Important Message (Signed)
Important Message  Patient Details  Name: Aaron Sosa MRN: [id number] Date of Birth: 02-Jun-1941   Medicare Important Message Given:  Yes  Patient asleep upon time of visit.  Copy of Medicare IM left on windowsill for reference.   Johnell Comings 11/11/2021, 4:17 PM

## 2021-11-11 NOTE — Progress Notes (Signed)
OT Cancellation Note  Patient Details Name: Aaron Sosa MRN: [id number] DOB: [birth year]   Cancelled Treatment:    Reason Eval/Treat Not Completed: Patient declined, no reason specified. Upon arrival pt reclined c HOB elevated. Politely declines therapy session stating "I need to take a day off." Will continue to follow POC.  Kathie Dike, M.S. OTR/L  11/11/21, 12:09 PM  ascom [phone]

## 2021-11-11 NOTE — Progress Notes (Signed)
Mobility Specialist - Progress Note   11/11/21 1100  Mobility  Activity Refused mobility  Mobility performed by Mobility specialist    Pt lying in bed upon arrival, utilizing RA. Pt declined mobility, no reason specified. Will attempt session another date/time.    Aaron Sosa Mobility Specialist 11/11/21, 11:21 AM

## 2021-11-12 LAB — GLUCOSE, CAPILLARY
Glucose-Capillary: 170 mg/dL — ABNORMAL HIGH (ref 70–99)
Glucose-Capillary: 170 mg/dL — ABNORMAL HIGH (ref 70–99)
Glucose-Capillary: 156 mg/dL — ABNORMAL HIGH (ref 70–99)
Glucose-Capillary: 159 mg/dL — ABNORMAL HIGH (ref 70–99)

## 2021-11-12 NOTE — Progress Notes (Signed)
PROGRESS NOTE    Aaron Sosa  XBJ:[id number] DOB: 01-07-1941 DOA: 11/05/2021 PCP: Lauro Regulus, MD    Brief Narrative:  81 y.o. 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 They presented from home to the ED on 11/05/2021 with right hip subtotal nondisplaced fracture of superior and inferior right pubic rami after mechanical fall. In the ED, they were treated with analgesia as needed.  Ortho surgery was consulted and recommended nonsurgical management.  Patient was admitted to medicine service for further workup and management of hip fracture as outlined in detail below. Additionally, patient had suspected heart failure exacerbation new O2 requirement and hypokalemia on exam.  History reveals this is likely due to nonadherence with diuretic therapy.  He was treated with IV diuretics and home medications.   11/08/21 -stable, ORA. Medically ready for dc to SNF when bed available.    Assessment & Plan:   Principal Problem:   Acute on chronic combined systolic and diastolic CHF (congestive heart failure) (HCC) Active Problems:   Essential hypertension   Obesity (BMI 30.0-34.9)   Closed fracture of ramus of right pubis (HCC)   Leukocytosis   Hyponatremia   Type 2 diabetes mellitus with hyperglycemia (HCC)   Elevated troponin   Hypoxia   Shortness of breath  Acute hypoxic respiratory failure 2/2 HrEF exacerbation- (resolved) likely secondary to non-adherence to home therapies. Responded well to IV diuresis and is now ORA without respiratory complaint. Bps remain soft so home therapies continue to hold and can be added on as tolerated. Plan: Continue torsemide 40 mg daily Supplement potassium as needed Strict ins and outs, daily weights Hypotension preventing GDMT pain control sufficient Monitor BP and restart as appropriate   Pubic ramus fracture, right, closed, initial encounter (HCC)- (present on admission). Awaiting  SNF - [facility] - Schedule acetaminophen -Scheduled gabapentin -Low-dose narcotics for breakthrough - Continue therapy evaluations   Elevated troponin- (present on admission) Suspect supply demand ischemia   Hyponatremia- (present on admission)- stable Na+ 129>131>130 - Fluid restriction - Cautious while on Lasix   Essential hypertension- (present on admission) BP soft -Hold Entresto, metoprolol   Type 2 diabetes mellitus with hyperglycemia (HCC)- (present on admission)- -Continue SS corrections -Semglee 12 units daily -NovoLog 3 units 3 times daily with meals -Hold home glipizide/metformin   Obesity (BMI 30.0-34.9)- (present on admission) BMI 32   Probably mild cognitive impairment - Outpatient follow-up    DVT prophylaxis: SQ Lovenox Code Status: Full Family Communication: None today Disposition Plan: Status is: Inpatient  Remains inpatient appropriate because: Unsafe discharge plan.  Patient medically stable for discharge at this time.  Pending placement at skilled nursing facility.       Level of care: Telemetry Cardiac  Consultants:  Ortho   Procedures:  None  Antimicrobials: None    Subjective: Seen and examined.  No SOB or CP.  Does complain of lower back and hip pain.  Endorses insufficient pain control  Objective: Vitals:   11/11/21 2325 11/12/21 0357 11/12/21 0722 11/12/21 1100  BP: 96/65 100/75 101/68 108/72  Pulse: 90 87 86   Resp: 16 15 17 17   Temp: 98.1 F (36.7 C) 97.8 F (36.6 C) 97.7 F (36.5 C) 97.8 F (36.6 C)  TempSrc: Oral Oral  Oral  SpO2: 98% 99% 98% 97%  Weight:  96.5 kg    Height:        Intake/Output Summary (Last 24 hours) at 11/12/2021 1158 Last  data filed at 11/12/2021 1100 Gross per 24 hour  Intake 1440 ml  Output 2300 ml  Net -860 ml   Filed Weights   11/10/21 0412 11/11/21 0500 11/12/21 0357  Weight: 95.5 kg 95.3 kg 96.5 kg    Examination:  General exam: No acute distress Respiratory system: Bibasilar  crackles.  Normal work of breathing.  Room air Cardiovascular system: Regular rate and rhythm, no murmurs, no pedal edema Gastrointestinal system: Abdomen is nondistended, soft and nontender. No organomegaly or masses felt. Normal bowel sounds heard. Central nervous system: Alert and oriented. No focal neurological deficits. Extremities: Decreased power LLE Skin: No rashes, lesions or ulcers Psychiatry: Judgement and insight appear normal. Mood & affect appropriate.     Data Reviewed: I have personally reviewed following labs and imaging studies  CBC: Recent Labs  Lab 11/05/21 1410 11/07/21 0709 11/11/21 0607  WBC 13.9* 8.4 7.8  NEUTROABS 12.3*  --   --   HGB 14.6 13.5 12.8*  HCT 44.5 41.7 38.4*  MCV 85.7 84.4 83.7  PLT 224 171 214   Basic Metabolic Panel: Recent Labs  Lab 11/05/21 1410 11/07/21 0709 11/08/21 0613 11/09/21 0422  NA 129* 131* 130* 133*  K 4.2 3.1* 3.1* 3.3*  CL 89* 91* 89* 96*  CO2 26 28 30 28   GLUCOSE 239* 212* 193* 158*  BUN 48* 49* 48* 46*  CREATININE 1.24 1.23 1.17 1.10  CALCIUM 8.7* 8.2* 8.4* 8.4*   GFR: Estimated Creatinine Clearance: 60.3 mL/min (by C-G formula based on SCr of 1.1 mg/dL). Liver Function Tests: Recent Labs  Lab 11/05/21 1410  AST 34  ALT 21  ALKPHOS 83  BILITOT 2.6*  PROT 7.1  ALBUMIN 4.0   No results for input(s): LIPASE, AMYLASE in the last 168 hours. No results for input(s): AMMONIA in the last 168 hours. Coagulation Profile: No results for input(s): INR, PROTIME in the last 168 hours. Cardiac Enzymes: No results for input(s): CKTOTAL, CKMB, CKMBINDEX, TROPONINI in the last 168 hours. BNP (last 3 results) No results for input(s): PROBNP in the last 8760 hours. HbA1C: No results for input(s): HGBA1C in the last 72 hours. CBG: Recent Labs  Lab 11/11/21 1201 11/11/21 1714 11/11/21 2003 11/12/21 0935 11/12/21 1112  GLUCAP 216* 162* 82 170* 170*   Lipid Profile: No results for input(s): CHOL, HDL, LDLCALC,  TRIG, CHOLHDL, LDLDIRECT in the last 72 hours. Thyroid Function Tests: No results for input(s): TSH, T4TOTAL, FREET4, T3FREE, THYROIDAB in the last 72 hours. Anemia Panel: No results for input(s): VITAMINB12, FOLATE, FERRITIN, TIBC, IRON, RETICCTPCT in the last 72 hours. Sepsis Labs: No results for input(s): PROCALCITON, LATICACIDVEN in the last 168 hours.  Recent Results (from the past 240 hour(s))  Resp Panel by RT-PCR (Flu A&B, Covid) Nasopharyngeal Swab     Status: None   Collection Time: 11/05/21  2:10 PM   Specimen: Nasopharyngeal Swab; Nasopharyngeal(NP) swabs in vial transport medium  Result Value Ref Range Status   SARS Coronavirus 2 by RT PCR NEGATIVE NEGATIVE Final    Comment: (NOTE) SARS-CoV-2 target nucleic acids are NOT DETECTED.  The SARS-CoV-2 RNA is generally detectable in upper respiratory specimens during the acute phase of infection. The lowest concentration of SARS-CoV-2 viral copies this assay can detect is 138 copies/mL. A negative result does not preclude SARS-Cov-2 infection and should not be used as the sole basis for treatment or other patient management decisions. A negative result may occur with  improper specimen collection/handling, submission of specimen other than nasopharyngeal  swab, presence of viral mutation(s) within the areas targeted by this assay, and inadequate number of viral copies(<138 copies/mL). A negative result must be combined with clinical observations, patient history, and epidemiological information. The expected result is Negative.  Fact Sheet for Patients:  BloggerCourse.com  Fact Sheet for Healthcare Providers:  SeriousBroker.it  This test is no t yet approved or cleared by the [place] FDA and  has been authorized for detection and/or diagnosis of SARS-CoV-2 by FDA under an Emergency Use Authorization (EUA). This EUA will remain  in effect (meaning this test can be  used) for the duration of the COVID-19 declaration under Section 564(b)(1) of the Act, 21 U.S.C.section 360bbb-3(b)(1), unless the authorization is terminated  or revoked sooner.       Influenza A by PCR NEGATIVE NEGATIVE Final   Influenza B by PCR NEGATIVE NEGATIVE Final    Comment: (NOTE) The Xpert Xpress SARS-CoV-2/FLU/RSV plus assay is intended as an aid in the diagnosis of influenza from Nasopharyngeal swab specimens and should not be used as a sole basis for treatment. Nasal washings and aspirates are unacceptable for Xpert Xpress SARS-CoV-2/FLU/RSV testing.  Fact Sheet for Patients: BloggerCourse.com  Fact Sheet for Healthcare Providers: SeriousBroker.it  This test is not yet approved or cleared by the [place] FDA and has been authorized for detection and/or diagnosis of SARS-CoV-2 by FDA under an Emergency Use Authorization (EUA). This EUA will remain in effect (meaning this test can be used) for the duration of the COVID-19 declaration under Section 564(b)(1) of the Act, 21 U.S.C. section 360bbb-3(b)(1), unless the authorization is terminated or revoked.  Performed at [facility], [facility], [address] [place], [place] [address]          Radiology Studies: No results found.      Scheduled Meds:  acetaminophen  1,000 mg Oral TID   aspirin EC  81 mg Oral Daily   clopidogrel  75 mg Oral Daily   enoxaparin (LOVENOX) injection  0.5 mg/kg Subcutaneous Q24H   gabapentin  300 mg Oral TID   insulin aspart  0-15 Units Subcutaneous TID WC   insulin aspart  3 Units Subcutaneous TID WC   insulin glargine-yfgn  12 Units Subcutaneous Daily   potassium chloride  20 mEq Oral BID   rosuvastatin  20 mg Oral QHS   torsemide  40 mg Oral Daily   Continuous Infusions:   LOS: 6 days    Time spent: 25 minutes    Tresa Moore, MD [facility]   If 7PM-7AM, please contact  night-coverage  11/12/2021, 11:58 AM

## 2021-11-12 NOTE — TOC Progression Note (Signed)
Transition of Care [facility]) - Progression Note    Patient Details  Name: Aaron Sosa MRN: [id number] Date of Birth: 01/16/1941  [facility] CM/SW Contact  Boris Sharper, LCSW Phone Number: 11/12/2021, 3:04 PM  Clinical Narrative:    CSW provided [name] with bed offers, [name] stated that she would like to look into the facilities and give CSW a call back. CSW provided call back number and will wait for returned call.   Expected Discharge Plan: [place] Barriers to Discharge: Continued Medical Work up  Expected Discharge Plan and Services Expected Discharge Plan: [place]   Discharge Planning Services: CM Consult Post Acute Care Choice: [place] Living arrangements for the past 2 months: Single Family Home                 DME Arranged: N/A DME Agency: NA       HH Arranged: NA HH Agency: NA         Social Determinants of Health (SDOH) Interventions    Readmission Risk Interventions No flowsheet data found.

## 2021-11-13 LAB — GLUCOSE, CAPILLARY
Glucose-Capillary: 169 mg/dL — ABNORMAL HIGH (ref 70–99)
Glucose-Capillary: 217 mg/dL — ABNORMAL HIGH (ref 70–99)
Glucose-Capillary: 112 mg/dL — ABNORMAL HIGH (ref 70–99)
Glucose-Capillary: 156 mg/dL — ABNORMAL HIGH (ref 70–99)

## 2021-11-13 LAB — CREATININE, SERUM
Creatinine, Ser: 0.96 mg/dL (ref 0.61–1.24)
GFR, Estimated: >60 mL/min (ref >60)

## 2021-11-13 LAB — RESP PANEL BY RT-PCR (FLU A&B, COVID) ARPGX2
Influenza A by PCR: NEGATIVE
Influenza B by PCR: NEGATIVE
SARS Coronavirus 2 by RT PCR: NEGATIVE

## 2021-11-13 NOTE — Progress Notes (Signed)
PROGRESS NOTE    Aaron Sosa  [id number]:[id number] DOB: Mar 01, 1941 DOA: 11/05/2021 PCP: Lauro Regulus, MD    Brief Narrative:  81 y.o. 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 They presented from home to the ED on 11/05/2021 with right hip subtotal nondisplaced fracture of superior and inferior right pubic rami after mechanical fall. In the ED, they were treated with analgesia as needed.  Ortho surgery was consulted and recommended nonsurgical management.  Patient was admitted to medicine service for further workup and management of hip fracture as outlined in detail below. Additionally, patient had suspected heart failure exacerbation new O2 requirement and hypokalemia on exam.  History reveals this is likely due to nonadherence with diuretic therapy.  He was treated with IV diuretics and home medications.   11/08/21 -stable, ORA. Medically ready for dc to SNF when bed available.    Assessment & Plan:   Principal Problem:   Acute on chronic combined systolic and diastolic CHF (congestive heart failure) (HCC) Active Problems:   Essential hypertension   Obesity (BMI 30.0-34.9)   Closed fracture of ramus of right pubis (HCC)   Leukocytosis   Hyponatremia   Type 2 diabetes mellitus with hyperglycemia (HCC)   Elevated troponin   Hypoxia   Shortness of breath  Acute hypoxic respiratory failure 2/2 HrEF exacerbation- (resolved) likely secondary to non-adherence to home therapies. Responded well to IV diuresis and is now ORA without respiratory complaint. Bps remain soft so home therapies continue to hold and can be added on as tolerated. Plan: Continue torsemide 40 mg daily Supplement potassium as needed Strict ins and outs, daily weights Hypotension preventing GDMT  Pain control adequate Monitor BP and restart as appropriate   Pubic ramus fracture, right, closed, initial encounter (HCC)- (present on admission). Awaiting  SNF - [facility] - Schedule acetaminophen -Scheduled gabapentin -Low-dose narcotics for breakthrough - Continue therapy evaluations   Elevated troponin- (present on admission) Suspect supply demand ischemia   Hyponatremia- (present on admission)- stable Na+ 129>131>130 - Fluid restriction - Cautious while on Lasix   Essential hypertension- (present on admission) BP soft -Hold Entresto, metoprolol   Type 2 diabetes mellitus with hyperglycemia (HCC)- (present on admission)- -Continue SS corrections -Semglee 12 units daily -NovoLog 3 units 3 times daily with meals -Hold home glipizide/metformin   Obesity (BMI 30.0-34.9)- (present on admission) BMI 32   Probably mild cognitive impairment - Outpatient follow-up    DVT prophylaxis: SQ Lovenox Code Status: Full Family Communication: None today Disposition Plan: Status is: Inpatient  Remains inpatient appropriate because: Unsafe discharge plan.  Patient medically stable for discharge at this time.  Pending placement at skilled nursing facility.       Level of care: Telemetry Cardiac  Consultants:  Ortho   Procedures:  None  Antimicrobials: None    Subjective: Seen and examined.  No complaints of chest pain or shortness of breath.  Endorses that pain in the right hip is slowly improving.  Objective: Vitals:   11/13/21 0453 11/13/21 0500 11/13/21 0815 11/13/21 1140  BP: 94/64  101/68 112/69  Pulse: 85  92 88  Resp: 18  16 14   Temp: 98.5 F (36.9 C)  97.9 F (36.6 C) (!) 97.4 F (36.3 C)  TempSrc: Oral     SpO2: 93%  97% 98%  Weight:  99 kg    Height:        Intake/Output Summary (Last 24 hours) at 11/13/2021 1212  Last data filed at 11/13/2021 0950 Gross per 24 hour  Intake 1200 ml  Output 2250 ml  Net -1050 ml   Filed Weights   11/11/21 0500 11/12/21 0357 11/13/21 0500  Weight: 95.3 kg 96.5 kg 99 kg    Examination:  General exam: No acute distress Respiratory system: Bibasilar crackles.  Normal  work of breathing.  Room air Cardiovascular system: Regular rate and rhythm, no murmurs, no pedal edema Gastrointestinal system: Abdomen is nondistended, soft and nontender. No organomegaly or masses felt. Normal bowel sounds heard. Central nervous system: Alert and oriented. No focal neurological deficits. Extremities: Decreased power LLE Skin: No rashes, lesions or ulcers Psychiatry: Judgement and insight appear normal. Mood & affect appropriate.     Data Reviewed: I have personally reviewed following labs and imaging studies  CBC: Recent Labs  Lab 11/07/21 0709 11/11/21 0607  WBC 8.4 7.8  HGB 13.5 12.8*  HCT 41.7 38.4*  MCV 84.4 83.7  PLT 171 214   Basic Metabolic Panel: Recent Labs  Lab 11/07/21 0709 11/08/21 0613 11/09/21 0422 11/13/21 0520  NA 131* 130* 133*  --   K 3.1* 3.1* 3.3*  --   CL 91* 89* 96*  --   CO2 28 30 28   --   GLUCOSE 212* 193* 158*  --   BUN 49* 48* 46*  --   CREATININE 1.23 1.17 1.10 0.96  CALCIUM 8.2* 8.4* 8.4*  --    GFR: Estimated Creatinine Clearance: 70 mL/min (by C-G formula based on SCr of 0.96 mg/dL). Liver Function Tests: No results for input(s): AST, ALT, ALKPHOS, BILITOT, PROT, ALBUMIN in the last 168 hours.  No results for input(s): LIPASE, AMYLASE in the last 168 hours. No results for input(s): AMMONIA in the last 168 hours. Coagulation Profile: No results for input(s): INR, PROTIME in the last 168 hours. Cardiac Enzymes: No results for input(s): CKTOTAL, CKMB, CKMBINDEX, TROPONINI in the last 168 hours. BNP (last 3 results) No results for input(s): PROBNP in the last 8760 hours. HbA1C: No results for input(s): HGBA1C in the last 72 hours. CBG: Recent Labs  Lab 11/12/21 1112 11/12/21 1611 11/12/21 2022 11/13/21 0814 11/13/21 1141  GLUCAP 170* 156* 159* 169* 217*   Lipid Profile: No results for input(s): CHOL, HDL, LDLCALC, TRIG, CHOLHDL, LDLDIRECT in the last 72 hours. Thyroid Function Tests: No results for  input(s): TSH, T4TOTAL, FREET4, T3FREE, THYROIDAB in the last 72 hours. Anemia Panel: No results for input(s): VITAMINB12, FOLATE, FERRITIN, TIBC, IRON, RETICCTPCT in the last 72 hours. Sepsis Labs: No results for input(s): PROCALCITON, LATICACIDVEN in the last 168 hours.  Recent Results (from the past 240 hour(s))  Resp Panel by RT-PCR (Flu A&B, Covid) Nasopharyngeal Swab     Status: None   Collection Time: 11/05/21  2:10 PM   Specimen: Nasopharyngeal Swab; Nasopharyngeal(NP) swabs in vial transport medium  Result Value Ref Range Status   SARS Coronavirus 2 by RT PCR NEGATIVE NEGATIVE Final    Comment: (NOTE) SARS-CoV-2 target nucleic acids are NOT DETECTED.  The SARS-CoV-2 RNA is generally detectable in upper respiratory specimens during the acute phase of infection. The lowest concentration of SARS-CoV-2 viral copies this assay can detect is 138 copies/mL. A negative result does not preclude SARS-Cov-2 infection and should not be used as the sole basis for treatment or other patient management decisions. A negative result may occur with  improper specimen collection/handling, submission of specimen other than nasopharyngeal swab, presence of viral mutation(s) within the areas targeted by this  assay, and inadequate number of viral copies(<138 copies/mL). A negative result must be combined with clinical observations, patient history, and epidemiological information. The expected result is Negative.  Fact Sheet for Patients:  BloggerCourse.com  Fact Sheet for Healthcare Providers:  SeriousBroker.it  This test is no t yet approved or cleared by the [place] FDA and  has been authorized for detection and/or diagnosis of SARS-CoV-2 by FDA under an Emergency Use Authorization (EUA). This EUA will remain  in effect (meaning this test can be used) for the duration of the COVID-19 declaration under Section 564(b)(1) of the Act,  21 U.S.C.section 360bbb-3(b)(1), unless the authorization is terminated  or revoked sooner.       Influenza A by PCR NEGATIVE NEGATIVE Final   Influenza B by PCR NEGATIVE NEGATIVE Final    Comment: (NOTE) The Xpert Xpress SARS-CoV-2/FLU/RSV plus assay is intended as an aid in the diagnosis of influenza from Nasopharyngeal swab specimens and should not be used as a sole basis for treatment. Nasal washings and aspirates are unacceptable for Xpert Xpress SARS-CoV-2/FLU/RSV testing.  Fact Sheet for Patients: BloggerCourse.com  Fact Sheet for Healthcare Providers: SeriousBroker.it  This test is not yet approved or cleared by the [place] FDA and has been authorized for detection and/or diagnosis of SARS-CoV-2 by FDA under an Emergency Use Authorization (EUA). This EUA will remain in effect (meaning this test can be used) for the duration of the COVID-19 declaration under Section 564(b)(1) of the Act, 21 U.S.C. section 360bbb-3(b)(1), unless the authorization is terminated or revoked.  Performed at [facility], [address] [place], [place] [address]          Radiology Studies: No results found.      Scheduled Meds:  acetaminophen  1,000 mg Oral TID   aspirin EC  81 mg Oral Daily   clopidogrel  75 mg Oral Daily   enoxaparin (LOVENOX) injection  0.5 mg/kg Subcutaneous Q24H   gabapentin  300 mg Oral TID   insulin aspart  0-15 Units Subcutaneous TID WC   insulin aspart  3 Units Subcutaneous TID WC   insulin glargine-yfgn  12 Units Subcutaneous Daily   potassium chloride  20 mEq Oral BID   rosuvastatin  20 mg Oral QHS   torsemide  40 mg Oral Daily   Continuous Infusions:   LOS: 7 days    Time spent: 25 minutes    Tresa Moore, MD [facility]   If 7PM-7AM, please contact night-coverage  11/13/2021, 12:12 PM

## 2021-11-13 NOTE — TOC Progression Note (Addendum)
Transition of Care [facility]) - Progression Note    Patient Details  Name: DENSIL OTTEY MRN: [id number] Date of Birth: 03-03-1941  [facility] CM/SW Contact  Gildardo Griffes, [place] Phone Number: 11/13/2021, 11:25 AM  Clinical Narrative:     CSW spoke with Dewayne Hatch who reports their first choice is [address], second [place] seismologist.   CSW has reached out to [employer] with [place] to see if they can accept patient today under [employer], pending response at this time.   Update: [place] has bed for patient tomorrow, requested covid test be ordered by MD today. Plan for dc to [facility] tomorrow 1/16.  Expected Discharge Plan: Skilled Nursing Facility Barriers to Discharge: Continued Medical Work up  Expected Discharge Plan and Services Expected Discharge Plan: Skilled Nursing Facility   Discharge Planning Services: CM Consult Post Acute Care Choice: Skilled Nursing Facility Living arrangements for the past 2 months: Single Family Home                 DME Arranged: N/A DME Agency: NA       HH Arranged: NA HH Agency: NA         Social Determinants of Health (SDOH) Interventions    Readmission Risk Interventions No flowsheet data found.

## 2021-11-14 LAB — CBC
HCT: 36.7 % — ABNORMAL LOW (ref 39.0–52.0)
Hemoglobin: 12.3 g/dL — ABNORMAL LOW (ref 13.0–17.0)
MCH: 28.3 pg (ref 26.0–34.0)
MCHC: 33.5 g/dL (ref 30.0–36.0)
MCV: 84.4 fL (ref 80.0–100.0)
Platelets: 220 10*3/uL (ref 150–400)
RBC: 4.35 MIL/uL (ref 4.22–5.81)
RDW: 14.7 % (ref 11.5–15.5)
WBC: 8 10*3/uL (ref 4.0–10.5)
nRBC: 0 % (ref 0.0–0.2)

## 2021-11-14 LAB — GLUCOSE, CAPILLARY
Glucose-Capillary: 178 mg/dL — ABNORMAL HIGH (ref 70–99)
Glucose-Capillary: 163 mg/dL — ABNORMAL HIGH (ref 70–99)

## 2021-11-14 MED ORDER — TORSEMIDE 40 MG PO TABS: 40.0000 mg | ORAL_TABLET | Freq: Every day | ORAL | Status: DC

## 2021-11-14 MED ORDER — OXYCODONE HCL 5 MG PO TABS: 5.0000 mg | ORAL_TABLET | ORAL | 0 refills | Status: AC | PRN

## 2021-11-14 MED ORDER — GABAPENTIN 300 MG PO CAPS: 300.0000 mg | ORAL_CAPSULE | Freq: Three times a day (TID) | ORAL | Status: DC

## 2021-11-14 NOTE — Discharge Summary (Signed)
Physician Discharge Summary  Aaron Sosa [id number] DOB: 1941/07/28 DOA: 11/05/2021  PCP: Kirk Ruths, MD  Admit date: 11/05/2021 Discharge date: 11/14/2021  Admitted From: Home Disposition:  SNF  Recommendations for Outpatient Follow-up:  Follow up with PCP in 1-2 weeks Follow up orthopedics 2 weeks  Home Health:No  Equipment/Devices:None   Discharge Condition:Stable  CODE STATUS:Full  Diet recommendation: Heart/carb  Brief/Interim Summary: 81 y.o. 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 They presented from home to the ED on 11/05/2021 with right hip subtotal nondisplaced fracture of superior and inferior right pubic rami after mechanical fall. In the ED, they were treated with analgesia as needed.  Ortho surgery was consulted and recommended nonsurgical management.  Patient was admitted to medicine service for further workup and management of hip fracture as outlined in detail below. Additionally, patient had suspected heart failure exacerbation new O2 requirement and hypokalemia on exam.  History reveals this is likely due to nonadherence with diuretic therapy.  He was treated with IV diuretics and home medications.   11/08/21 -stable, ORA. Medically ready for dc to SNF when bed available 1/16- insurance authorization obtained.  DC to SNF    Discharge Diagnoses:  Principal Problem:   Acute on chronic combined systolic and diastolic CHF (congestive heart failure) (HCC) Active Problems:   Essential hypertension   Obesity (BMI 30.0-34.9)   Closed fracture of ramus of right pubis (HCC)   Leukocytosis   Hyponatremia   Type 2 diabetes mellitus with hyperglycemia (HCC)   Elevated troponin   Hypoxia   Shortness of breath  Acute hypoxic respiratory failure 2/2 HrEF exacerbation- (resolved) likely secondary to non-adherence to home therapies. Responded well to IV diuresis and is now ORA without respiratory  complaint. Bps remain soft so home therapies continue to hold and can be added on as tolerated. - Home meds resumed on dc med rec.  Use caution in setting of hypotension Plan: DC to SNF Continue torsemide 40 mg daily (previous dose 100mg  qd) Resume home metolazone, BB, entresto Outpatinet followup with cardio   Pubic ramus fracture, right, closed, initial encounter ([facility])- (present on admission). Awaiting SNF - [facility] - Schedule acetaminophen -Scheduled gabapentin -Low-dose narcotics for breakthrough - Continue therapy evaluations --Outpatinet ortho followup   Elevated troponin- (present on admission) Suspect supply demand ischemia   Hyponatremia- (present on admission)- stable Na+ 129>131>130 - Fluid restriction - Cautious while on Lasix   Essential hypertension- (present on admission) BP soft -Hold Entresto, metoprolol (I have resumed on dc med rec, use caution with administration in setting of hypotension)   Type 2 diabetes mellitus with hyperglycemia ([facility])- (present on admission)- -Resume home PO regimen on dc   Obesity (BMI 30.0-34.9)- (present on admission) BMI 32   Probably mild cognitive impairment - Outpatient follow-up  Discharge Instructions  Discharge Instructions     Diet - low sodium heart healthy   Complete by: As directed    Increase activity slowly   Complete by: As directed       Allergies as of 11/14/2021       Reactions   Empagliflozin Other (See Comments)   Prednisone Other (See Comments), Swelling   Fluid build up    Shrimp Extract Allergy Skin Test Anaphylaxis   Atorvastatin Other (See Comments)   myalgia Other reaction(s): Unknown myalgia   Codeine    Other reaction(s): Unknown Makes him feel crazy   Dust Mite Extract    Shrimp [  shellfish Allergy]         Medication List     STOP taking these medications    alum & mag hydroxide-simeth 200-200-20 MG/5ML suspension Commonly known as: MAALOX/MYLANTA   furosemide 10 MG/ML  injection Commonly known as: LASIX   metoprolol tartrate 25 MG tablet Commonly known as: LOPRESSOR       TAKE these medications    acetaminophen 500 MG tablet Commonly known as: TYLENOL Take 2 tablets (1,000 mg total) by mouth every 8 (eight) hours as needed for mild pain, moderate pain or headache. What changed: Another medication with the same name was removed. Continue taking this medication, and follow the directions you see here.   aspirin 81 MG EC tablet Take 1 tablet (81 mg total) by mouth daily.   calcium carbonate 500 MG chewable tablet Commonly known as: TUMS - dosed in mg elemental calcium Chew 1 tablet (200 mg of elemental calcium total) by mouth 3 (three) times daily as needed for indigestion or heartburn.   clopidogrel 75 MG tablet Commonly known as: PLAVIX Take 75 mg by mouth daily.   docusate sodium 100 MG capsule Commonly known as: COLACE Take 1 capsule (100 mg total) by mouth 2 (two) times daily as needed for mild constipation or moderate constipation.   Entresto 24-26 MG Generic drug: sacubitril-valsartan Take 1 tablet by mouth 2 (two) times daily.   gabapentin 300 MG capsule Commonly known as: NEURONTIN Take 1 capsule (300 mg total) by mouth 3 (three) times daily.   glipiZIDE 10 MG 24 hr tablet Commonly known as: GLUCOTROL XL Take 10 mg by mouth daily.   magnesium oxide 400 MG tablet Commonly known as: MAG-OX Take 400 mg by mouth 2 (two) times daily.   metFORMIN 500 MG 24 hr tablet Commonly known as: GLUCOPHAGE-XR Take 2,000 mg by mouth daily with supper. With dinner   metolazone 5 MG tablet Commonly known as: ZAROXOLYN Take 5 mg by mouth as directed. Take 1 tablet (5 mg total) by mouth every Tuesday, Thursday, Saturday, and Sunday   metoprolol succinate 25 MG 24 hr tablet Commonly known as: TOPROL-XL Take 25 mg by mouth daily.   nitroGLYCERIN 0.4 MG SL tablet Commonly known as: NITROSTAT Place under the tongue.   OneTouch Ultra test  strip Generic drug: glucose blood Use to check blood sugar up to 2 x daily   oxyCODONE 5 MG immediate release tablet Commonly known as: Oxy IR/ROXICODONE Take 1 tablet (5 mg total) by mouth every 4 (four) hours as needed for up to 3 days for severe pain. SNF use only   polyethylene glycol powder 17 GM/SCOOP powder Commonly known as: GLYCOLAX/MIRALAX Take 17 g by mouth daily as needed. Take 1 packet (17 g total) by mouth once daily as needed for Constipation Mix in 4-8ounces of fluid prior to taking.   PRESERVISION AREDS 2 PO Take 1 tablet by mouth 2 (two) times daily.   ProAir HFA 108 (90 Base) MCG/ACT inhaler Generic drug: albuterol Inhale 2 puffs into the lungs every 4 (four) hours as needed for shortness of breath or wheezing.   rosuvastatin 20 MG tablet Commonly known as: CRESTOR Take 20 mg by mouth at bedtime.   senna-docusate 8.6-50 MG tablet Commonly known as: Senokot-S Take 2 tablets by mouth 2 (two) times daily as needed for constipation.   Torsemide 40 MG Tabs Take 40 mg by mouth daily. What changed:  medication strength how much to take        Follow-up  Information     Poggi, Marshall Cork, MD. Schedule an appointment as soon as possible for a visit in 5 week(s).   Specialty: Orthopedic Surgery Contact information: [place] [address] [phone]                Allergies  Allergen Reactions   Empagliflozin Other (See Comments)   Prednisone Other (See Comments) and Swelling    Fluid build up    Shrimp Extract Allergy Skin Test Anaphylaxis   Atorvastatin Other (See Comments)    myalgia Other reaction(s): Unknown myalgia   Codeine     Other reaction(s): Unknown Makes him feel crazy   Dust Mite Extract    Shrimp [Shellfish Allergy]     Consultations: Orthopedics   Procedures/Studies: CT HEAD WO CONTRAST  Result Date: 11/05/2021 CLINICAL DATA:  Minor head trauma.  Fall. EXAM: CT HEAD WITHOUT  CONTRAST TECHNIQUE: Contiguous axial images were obtained from the base of the skull through the vertex without intravenous contrast. COMPARISON:  None. FINDINGS: Brain: No subdural, epidural, or subarachnoid hemorrhage identified. Ventricles and sulci are mildly prominent. Cerebellum, brainstem, and basal cisterns are normal. No mass effect or midline shift. No acute cortical ischemia or infarct. Vascular: Calcified atherosclerosis seen in the intracranial carotids. Skull: Normal. Negative for fracture or focal lesion. Sinuses/Orbits: No acute finding. Other: None. IMPRESSION: No acute intracranial abnormalities identified. Electronically Signed   By: Dorise Bullion III M.D.   On: 11/05/2021 11:53   DG Chest Port 1 View  Result Date: 11/05/2021 CLINICAL DATA:  Hypoxia. EXAM: PORTABLE CHEST 1 VIEW COMPARISON:  Chest radiograph dated 03/16/2020. FINDINGS: Shallow inspiration. No focal consolidation, pleural effusion or pneumothorax. There is cardiomegaly with vascular congestion and possible mild edema. No acute osseous pathology. IMPRESSION: Cardiomegaly with vascular congestion and possible mild edema. No focal consolidation. Electronically Signed   By: Anner Crete M.D.   On: 11/05/2021 22:10   ECHOCARDIOGRAM COMPLETE  Result Date: 11/06/2021    ECHOCARDIOGRAM REPORT   Patient Name:   Aaron Sosa Date of Exam: 11/06/2021 Medical Rec #:  [id number]     Height:       68.0 in Accession #:    [id number]    Weight:       213.0 lb Date of Birth:  06/04/1941      BSA:          2.099 m Patient Age:    75 years      BP:           88/59 mmHg Patient Gender: M             HR:           85 bpm. Exam Location:  [facility] Procedure: 2D Echo and Intracardiac Opacification Agent Indications:     CHF I50.9  History:         Patient has prior history of Echocardiogram examinations, most                  recent 03/17/2020.  Sonographer:     Kathlen Brunswick RDCS Referring Phys:  [id number] [place] Diagnosing Phys: Ida Rogue MD  Sonographer Comments: Technically difficult study due to poor echo windows and suboptimal apical window. IMPRESSIONS  1. Left ventricular ejection fraction, by estimation, is 25 to 30%. The left ventricle has severely decreased function. The left ventricle demonstrates global hypokinesis with severe hypokinesis of the anerior/anteroseptal and apical region. Left ventricular diastolic parameters are  consistent with Grade II diastolic dysfunction (pseudonormalization).  2. Right ventricular systolic function is moderately reduced. The right ventricular size is mildly enlarged. Tricuspid regurgitation signal is inadequate for assessing PA pressure.  3. Left atrial size was moderately dilated.  4. Right atrial size was moderately dilated.  5. The mitral valve is normal in structure. Mild to moderate mitral valve regurgitation. No evidence of mitral stenosis.  6. The aortic valve is normal in structure. Aortic valve regurgitation is not visualized. Aortic valve sclerosis/calcification is present, without any evidence of aortic stenosis. FINDINGS  Left Ventricle: Left ventricular ejection fraction, by estimation, is 25 to 30%. The left ventricle has severely decreased function. The left ventricle demonstrates global hypokinesis. Definity contrast agent was given IV to delineate the left ventricular endocardial borders. The left ventricular internal cavity size was normal in size. There is no left ventricular hypertrophy. Left ventricular diastolic parameters are consistent with Grade II diastolic dysfunction (pseudonormalization). Right Ventricle: The right ventricular size is mildly enlarged. No increase in right ventricular wall thickness. Right ventricular systolic function is moderately reduced. Tricuspid regurgitation signal is inadequate for assessing PA pressure. Left Atrium: Left atrial size was moderately dilated. Right Atrium: Right atrial size was moderately dilated. Pericardium: There is no evidence  of pericardial effusion. Mitral Valve: The mitral valve is normal in structure. Mild to moderate mitral valve regurgitation. No evidence of mitral valve stenosis. Tricuspid Valve: The tricuspid valve is normal in structure. Tricuspid valve regurgitation is not demonstrated. No evidence of tricuspid stenosis. Aortic Valve: The aortic valve is normal in structure. Aortic valve regurgitation is not visualized. Aortic valve sclerosis/calcification is present, without any evidence of aortic stenosis. Aortic valve peak gradient measures 4.6 mmHg. Pulmonic Valve: The pulmonic valve was normal in structure. Pulmonic valve regurgitation is not visualized. No evidence of pulmonic stenosis. Aorta: The aortic root is normal in size and structure. Venous: The pulmonary veins were not well visualized. The inferior vena cava was not well visualized. IAS/Shunts: No atrial level shunt detected by color flow Doppler.  LEFT VENTRICLE PLAX 2D LVIDd:         5.50 cm      Diastology LVIDs:         4.75 cm      LV e' medial:    6.85 cm/s LV PW:         1.15 cm      LV E/e' medial:  12.4 LV IVS:        1.15 cm      LV e' lateral:   8.70 cm/s LVOT diam:     2.20 cm      LV E/e' lateral: 9.7 LV SV:         37 LV SV Index:   18 LVOT Area:     3.80 cm  LV Volumes (MOD) LV vol d, MOD A2C: 137.0 ml LV vol d, MOD A4C: 209.0 ml LV vol s, MOD A2C: 88.3 ml LV vol s, MOD A4C: 156.0 ml LV SV MOD A2C:     48.7 ml LV SV MOD A4C:     209.0 ml LV SV MOD BP:      44.8 ml RIGHT VENTRICLE RV Basal diam:  4.40 cm RV S prime:     11.30 cm/s TAPSE (M-mode): 1.6 cm LEFT ATRIUM             Index        RIGHT ATRIUM  Index LA diam:        4.80 cm 2.29 cm/m   RA Area:     30.00 cm LA Vol (A2C):   43.3 ml 20.63 ml/m  RA Volume:   115.00 ml 54.78 ml/m LA Vol (A4C):   82.0 ml 39.06 ml/m LA Biplane Vol: 61.4 ml 29.25 ml/m  AORTIC VALVE                 PULMONIC VALVE AV Area (Vmax): 2.02 cm     PV Vmax:       0.71 m/s AV Vmax:        107.00 cm/s  PV  Peak grad:  2.0 mmHg AV Peak Grad:   4.6 mmHg LVOT Vmax:      56.80 cm/s LVOT Vmean:     36.900 cm/s LVOT VTI:       0.098 m  AORTA Ao Root diam: 3.40 cm Ao Asc diam:  3.30 cm MITRAL VALVE MV Area (PHT): 5.88 cm    SHUNTS MV Decel Time: 129 msec    Systemic VTI:  0.10 m MV E velocity: 84.80 cm/s  Systemic Diam: 2.20 cm MV A velocity: 58.70 cm/s MV E/A ratio:  1.44 Ida Rogue MD Electronically signed by Ida Rogue MD Signature Date/Time: 11/06/2021/11:34:03 AM    Final    DG Hip Unilat  With Pelvis 2-3 Views Right  Result Date: 11/05/2021 CLINICAL DATA:  [place] onto right side. Right hip pain. Unable to bear weight on the right leg. EXAM: DG HIP (WITH OR WITHOUT PELVIS) 2-3V RIGHT COMPARISON:  None. FINDINGS: Subtle nondisplaced fracture of the inferior right pubic ramus is suggested. No other evidence of a fracture.  No bone lesion. Hip joints, SI joints and symphysis pubis are normally aligned. Skeletal structures are diffusely demineralized. There are scattered arterial vascular calcifications. Soft tissues otherwise unremarkable. IMPRESSION: 1. Subtle nondisplaced fracture of the inferior right pubic ramus. No other fractures. No dislocation. Electronically Signed   By: Lajean Manes M.D.   On: 11/05/2021 11:10      Subjective: Seen and examined on the day of dc, no distress.  Stable for dc to snf  Discharge Exam: Vitals:   11/14/21 0457 11/14/21 0729  BP: 90/61 93/63  Pulse: 87 88  Resp: 16 14  Temp: 98.4 F (36.9 C) 98 F (36.7 C)  SpO2: 98% 96%   Vitals:   11/14/21 0109 11/14/21 0457 11/14/21 0500 11/14/21 0729  BP: 114/75 90/61  93/63  Pulse: 69 87  88  Resp: 16 16  14   Temp: 98.3 F (36.8 C) 98.4 F (36.9 C)  98 F (36.7 C)  TempSrc:    Oral  SpO2: 98% 98%  96%  Weight:   99 kg   Height:        General: Pt is alert, awake, not in acute distress Cardiovascular: RRR, S1/S2 +, no rubs, no gallops Respiratory: CTA bilaterally, no wheezing, no rhonchi Abdominal: Soft,  NT, ND, bowel sounds + Extremities: no edema, no cyanosis    The results of significant diagnostics from this hospitalization (including imaging, microbiology, ancillary and laboratory) are listed below for reference.     Microbiology: Recent Results (from the past 240 hour(s))  Resp Panel by RT-PCR (Flu A&B, Covid) Nasopharyngeal Swab     Status: None   Collection Time: 11/05/21  2:10 PM   Specimen: Nasopharyngeal Swab; Nasopharyngeal(NP) swabs in vial transport medium  Result Value Ref Range Status   SARS Coronavirus 2 by RT PCR  NEGATIVE NEGATIVE Final    Comment: (NOTE) SARS-CoV-2 target nucleic acids are NOT DETECTED.  The SARS-CoV-2 RNA is generally detectable in upper respiratory specimens during the acute phase of infection. The lowest concentration of SARS-CoV-2 viral copies this assay can detect is 138 copies/mL. A negative result does not preclude SARS-Cov-2 infection and should not be used as the sole basis for treatment or other patient management decisions. A negative result may occur with  improper specimen collection/handling, submission of specimen other than nasopharyngeal swab, presence of viral mutation(s) within the areas targeted by this assay, and inadequate number of viral copies(<138 copies/mL). A negative result must be combined with clinical observations, patient history, and epidemiological information. The expected result is Negative.  Fact Sheet for Patients:  EntrepreneurPulse.com.au  Fact Sheet for Healthcare Providers:  IncredibleEmployment.be  This test is no t yet approved or cleared by the [place] FDA and  has been authorized for detection and/or diagnosis of SARS-CoV-2 by FDA under an Emergency Use Authorization (EUA). This EUA will remain  in effect (meaning this test can be used) for the duration of the COVID-19 declaration under Section 564(b)(1) of the Act, 21 U.S.C.section 360bbb-3(b)(1),  unless the authorization is terminated  or revoked sooner.       Influenza A by PCR NEGATIVE NEGATIVE Final   Influenza B by PCR NEGATIVE NEGATIVE Final    Comment: (NOTE) The Xpert Xpress SARS-CoV-2/FLU/RSV plus assay is intended as an aid in the diagnosis of influenza from Nasopharyngeal swab specimens and should not be used as a sole basis for treatment. Nasal washings and aspirates are unacceptable for Xpert Xpress SARS-CoV-2/FLU/RSV testing.  Fact Sheet for Patients: EntrepreneurPulse.com.au  Fact Sheet for Healthcare Providers: IncredibleEmployment.be  This test is not yet approved or cleared by the [place] FDA and has been authorized for detection and/or diagnosis of SARS-CoV-2 by FDA under an Emergency Use Authorization (EUA). This EUA will remain in effect (meaning this test can be used) for the duration of the COVID-19 declaration under Section 564(b)(1) of the Act, 21 U.S.C. section 360bbb-3(b)(1), unless the authorization is terminated or revoked.  Performed at [facility], [address] [place], [place] [address]   Resp Panel by RT-PCR (Flu A&B, Covid) Nasopharyngeal Swab     Status: None   Collection Time: 11/13/21  2:00 PM   Specimen: Nasopharyngeal Swab; Nasopharyngeal(NP) swabs in vial transport medium  Result Value Ref Range Status   SARS Coronavirus 2 by RT PCR NEGATIVE NEGATIVE Final    Comment: (NOTE) SARS-CoV-2 target nucleic acids are NOT DETECTED.  The SARS-CoV-2 RNA is generally detectable in upper respiratory specimens during the acute phase of infection. The lowest concentration of SARS-CoV-2 viral copies this assay can detect is 138 copies/mL. A negative result does not preclude SARS-Cov-2 infection and should not be used as the sole basis for treatment or other patient management decisions. A negative result may occur with  improper specimen collection/handling, submission of specimen  other than nasopharyngeal swab, presence of viral mutation(s) within the areas targeted by this assay, and inadequate number of viral copies(<138 copies/mL). A negative result must be combined with clinical observations, patient history, and epidemiological information. The expected result is Negative.  Fact Sheet for Patients:  EntrepreneurPulse.com.au  Fact Sheet for Healthcare Providers:  IncredibleEmployment.be  This test is no t yet approved or cleared by the [place] FDA and  has been authorized for detection and/or diagnosis of SARS-CoV-2 by FDA under an Emergency Use Authorization (EUA). This  EUA will remain  in effect (meaning this test can be used) for the duration of the COVID-19 declaration under Section 564(b)(1) of the Act, 21 U.S.C.section 360bbb-3(b)(1), unless the authorization is terminated  or revoked sooner.       Influenza A by PCR NEGATIVE NEGATIVE Final   Influenza B by PCR NEGATIVE NEGATIVE Final    Comment: (NOTE) The Xpert Xpress SARS-CoV-2/FLU/RSV plus assay is intended as an aid in the diagnosis of influenza from Nasopharyngeal swab specimens and should not be used as a sole basis for treatment. Nasal washings and aspirates are unacceptable for Xpert Xpress SARS-CoV-2/FLU/RSV testing.  Fact Sheet for Patients: EntrepreneurPulse.com.au  Fact Sheet for Healthcare Providers: IncredibleEmployment.be  This test is not yet approved or cleared by the [place] FDA and has been authorized for detection and/or diagnosis of SARS-CoV-2 by FDA under an Emergency Use Authorization (EUA). This EUA will remain in effect (meaning this test can be used) for the duration of the COVID-19 declaration under Section 564(b)(1) of the Act, 21 U.S.C. section 360bbb-3(b)(1), unless the authorization is terminated or revoked.  Performed at [facility], [address]  [place], [place] [address]      Labs: BNP (last 3 results) Recent Labs    11/05/21 2236  BNP [id number]*   Basic Metabolic Panel: Recent Labs  Lab 11/08/21 0613 11/09/21 0422 11/13/21 0520  NA 130* 133*  --   K 3.1* 3.3*  --   CL 89* 96*  --   CO2 30 28  --   GLUCOSE 193* 158*  --   BUN 48* 46*  --   CREATININE 1.17 1.10 0.96  CALCIUM 8.4* 8.4*  --    Liver Function Tests: No results for input(s): AST, ALT, ALKPHOS, BILITOT, PROT, ALBUMIN in the last 168 hours. No results for input(s): LIPASE, AMYLASE in the last 168 hours. No results for input(s): AMMONIA in the last 168 hours. CBC: Recent Labs  Lab 11/11/21 0607 11/14/21 0506  WBC 7.8 8.0  HGB 12.8* 12.3*  HCT 38.4* 36.7*  MCV 83.7 84.4  PLT 214 220   Cardiac Enzymes: No results for input(s): CKTOTAL, CKMB, CKMBINDEX, TROPONINI in the last 168 hours. BNP: Invalid input(s): POCBNP CBG: Recent Labs  Lab 11/13/21 0814 11/13/21 1141 11/13/21 1619 11/13/21 2145 11/14/21 0730  GLUCAP 169* 217* 112* 156* 163*   D-Dimer No results for input(s): DDIMER in the last 72 hours. Hgb A1c No results for input(s): HGBA1C in the last 72 hours. Lipid Profile No results for input(s): CHOL, HDL, LDLCALC, TRIG, CHOLHDL, LDLDIRECT in the last 72 hours. Thyroid function studies No results for input(s): TSH, T4TOTAL, T3FREE, THYROIDAB in the last 72 hours.  Invalid input(s): FREET3 Anemia work up No results for input(s): VITAMINB12, FOLATE, FERRITIN, TIBC, IRON, RETICCTPCT in the last 72 hours. Urinalysis    Component Value Date/Time   COLORURINE YELLOW (A) 11/05/2021 2236   APPEARANCEUR CLEAR (A) 11/05/2021 2236   LABSPEC 1.011 11/05/2021 2236   PHURINE 5.0 11/05/2021 2236   GLUCOSEU NEGATIVE 11/05/2021 2236   HGBUR NEGATIVE 11/05/2021 2236   BILIRUBINUR NEGATIVE 11/05/2021 2236   KETONESUR NEGATIVE 11/05/2021 2236   PROTEINUR NEGATIVE 11/05/2021 2236   NITRITE NEGATIVE 11/05/2021 2236   LEUKOCYTESUR NEGATIVE  11/05/2021 2236   Sepsis Labs Invalid input(s): PROCALCITONIN,  WBC,  LACTICIDVEN Microbiology Recent Results (from the past 240 hour(s))  Resp Panel by RT-PCR (Flu A&B, Covid) Nasopharyngeal Swab     Status: None   Collection Time: 11/05/21  2:10 PM   Specimen: Nasopharyngeal Swab; Nasopharyngeal(NP) swabs in vial transport medium  Result Value Ref Range Status   SARS Coronavirus 2 by RT PCR NEGATIVE NEGATIVE Final    Comment: (NOTE) SARS-CoV-2 target nucleic acids are NOT DETECTED.  The SARS-CoV-2 RNA is generally detectable in upper respiratory specimens during the acute phase of infection. The lowest concentration of SARS-CoV-2 viral copies this assay can detect is 138 copies/mL. A negative result does not preclude SARS-Cov-2 infection and should not be used as the sole basis for treatment or other patient management decisions. A negative result may occur with  improper specimen collection/handling, submission of specimen other than nasopharyngeal swab, presence of viral mutation(s) within the areas targeted by this assay, and inadequate number of viral copies(<138 copies/mL). A negative result must be combined with clinical observations, patient history, and epidemiological information. The expected result is Negative.  Fact Sheet for Patients:  BloggerCourse.com  Fact Sheet for Healthcare Providers:  SeriousBroker.it  This test is no t yet approved or cleared by the [place] FDA and  has been authorized for detection and/or diagnosis of SARS-CoV-2 by FDA under an Emergency Use Authorization (EUA). This EUA will remain  in effect (meaning this test can be used) for the duration of the COVID-19 declaration under Section 564(b)(1) of the Act, 21 U.S.C.section 360bbb-3(b)(1), unless the authorization is terminated  or revoked sooner.       Influenza A by PCR NEGATIVE NEGATIVE Final   Influenza B by PCR NEGATIVE  NEGATIVE Final    Comment: (NOTE) The Xpert Xpress SARS-CoV-2/FLU/RSV plus assay is intended as an aid in the diagnosis of influenza from Nasopharyngeal swab specimens and should not be used as a sole basis for treatment. Nasal washings and aspirates are unacceptable for Xpert Xpress SARS-CoV-2/FLU/RSV testing.  Fact Sheet for Patients: BloggerCourse.com  Fact Sheet for Healthcare Providers: SeriousBroker.it  This test is not yet approved or cleared by the [place] FDA and has been authorized for detection and/or diagnosis of SARS-CoV-2 by FDA under an Emergency Use Authorization (EUA). This EUA will remain in effect (meaning this test can be used) for the duration of the COVID-19 declaration under Section 564(b)(1) of the Act, 21 U.S.C. section 360bbb-3(b)(1), unless the authorization is terminated or revoked.  Performed at [facility], [address] [place], [place] [address]   Resp Panel by RT-PCR (Flu A&B, Covid) Nasopharyngeal Swab     Status: None   Collection Time: 11/13/21  2:00 PM   Specimen: Nasopharyngeal Swab; Nasopharyngeal(NP) swabs in vial transport medium  Result Value Ref Range Status   SARS Coronavirus 2 by RT PCR NEGATIVE NEGATIVE Final    Comment: (NOTE) SARS-CoV-2 target nucleic acids are NOT DETECTED.  The SARS-CoV-2 RNA is generally detectable in upper respiratory specimens during the acute phase of infection. The lowest concentration of SARS-CoV-2 viral copies this assay can detect is 138 copies/mL. A negative result does not preclude SARS-Cov-2 infection and should not be used as the sole basis for treatment or other patient management decisions. A negative result may occur with  improper specimen collection/handling, submission of specimen other than nasopharyngeal swab, presence of viral mutation(s) within the areas targeted by this assay, and inadequate number of viral copies(<138  copies/mL). A negative result must be combined with clinical observations, patient history, and epidemiological information. The expected result is Negative.  Fact Sheet for Patients:  BloggerCourse.com  Fact Sheet for Healthcare Providers:  SeriousBroker.it  This test is no t yet approved  or cleared by the [place] and  has been authorized for detection and/or diagnosis of SARS-CoV-2 by FDA under an Emergency Use Authorization (EUA). This EUA will remain  in effect (meaning this test can be used) for the duration of the COVID-19 declaration under Section 564(b)(1) of the Act, 21 U.S.C.section 360bbb-3(b)(1), unless the authorization is terminated  or revoked sooner.       Influenza A by PCR NEGATIVE NEGATIVE Final   Influenza B by PCR NEGATIVE NEGATIVE Final    Comment: (NOTE) The Xpert Xpress SARS-CoV-2/FLU/RSV plus assay is intended as an aid in the diagnosis of influenza from Nasopharyngeal swab specimens and should not be used as a sole basis for treatment. Nasal washings and aspirates are unacceptable for Xpert Xpress SARS-CoV-2/FLU/RSV testing.  Fact Sheet for Patients: EntrepreneurPulse.com.au  Fact Sheet for Healthcare Providers: IncredibleEmployment.be  This test is not yet approved or cleared by the [place] FDA and has been authorized for detection and/or diagnosis of SARS-CoV-2 by FDA under an Emergency Use Authorization (EUA). This EUA will remain in effect (meaning this test can be used) for the duration of the COVID-19 declaration under Section 564(b)(1) of the Act, 21 U.S.C. section 360bbb-3(b)(1), unless the authorization is terminated or revoked.  Performed at [name], [address] [place], [place] [address]      Time coordinating discharge: Over 30 minutes  SIGNED:   Sidney Ace, MD  [facility] 11/14/2021, 9:10  AM Pager   If 7PM-7AM, please contact night-coverage

## 2021-11-14 NOTE — TOC Transition Note (Addendum)
Transition of Care [name]) - CM/SW Discharge Note   Patient Details  Name: Aaron Sosa MRN: [id number] Date of Birth: 24-May-1941  Transition of [facility]) CM/SW Contact:  Gildardo Griffes, LCSW Phone Number: 11/14/2021, 9:34 AM   Clinical Narrative:     Patient will DC to: [place] Anticipated DC date: 11/14/21 Family notified: daughter ann Transport [id number]  Per MD patient ready for DC to [facility] RN, patient, patient's family, and facility notified of DC. Discharge Summary sent to facility. RN given number for report   [phone] Room 123B. DC packet on chart. Ambulance transport requested for patient.  CSW signing off.  Angeline Slim, LCSW    Final next level of care: Skilled Nursing Facility Barriers to Discharge: No Barriers Identified   Patient Goals and CMS Choice Patient states their goals for this hospitalization and ongoing recovery are:: to get back home CMS Medicare.gov Compare Post Acute Care list provided to:: Patient Represenative (must comment) (daughter Dewayne Hatch) Choice offered to / list presented to : Adult Children  Discharge Placement              Patient chooses bed at:  [facility]) Patient to be transferred to facility by: [facility] Name of family member notified: daughter Dewayne Hatch Patient and family notified of of transfer: 11/14/21  Discharge Plan and Services   Discharge Planning Services: CM Consult Post Acute Care Choice: Skilled Nursing Facility          DME Arranged: N/A DME Agency: NA       HH Arranged: NA HH Agency: NA        Social Determinants of Health (SDOH) Interventions     Readmission Risk Interventions No flowsheet data found.

## 2021-11-17 ENCOUNTER — Ambulatory Visit: Payer: Medicare HMO | Admitting: Family

## 2022-03-29 ENCOUNTER — Other Ambulatory Visit: Payer: Self-pay

## 2022-03-29 ENCOUNTER — Emergency Department (HOSPITAL_COMMUNITY)
Admission: EM | Admit: 2022-03-29 | Discharge: 2022-03-30 | Disposition: A | Discharge status: Home / Self Care | Payer: Medicare HMO | Attending: Emergency Medicine | Admitting: Emergency Medicine

## 2022-03-29 DIAGNOSIS — E11319 Type 2 diabetes mellitus with unspecified diabetic retinopathy without macular edema: Secondary | ICD-10-CM | POA: Diagnosis not present

## 2022-03-29 DIAGNOSIS — K5900 Constipation, unspecified: Secondary | ICD-10-CM | POA: Diagnosis present

## 2022-03-29 DIAGNOSIS — I110 Hypertensive heart disease with heart failure: Secondary | ICD-10-CM | POA: Diagnosis not present

## 2022-03-29 DIAGNOSIS — I5043 Acute on chronic combined systolic (congestive) and diastolic (congestive) heart failure: Secondary | ICD-10-CM | POA: Diagnosis not present

## 2022-03-29 DIAGNOSIS — K5909 Other constipation: Secondary | ICD-10-CM | POA: Diagnosis not present

## 2022-03-29 DIAGNOSIS — Z7982 Long term (current) use of aspirin: Secondary | ICD-10-CM | POA: Insufficient documentation

## 2022-03-29 DIAGNOSIS — R600 Localized edema: Secondary | ICD-10-CM | POA: Diagnosis not present

## 2022-03-29 DIAGNOSIS — Z7984 Long term (current) use of oral hypoglycemic drugs: Secondary | ICD-10-CM | POA: Insufficient documentation

## 2022-03-29 DIAGNOSIS — Z79899 Other long term (current) drug therapy: Secondary | ICD-10-CM | POA: Insufficient documentation

## 2022-03-29 LAB — CBC WITH DIFFERENTIAL/PLATELET
Abs Immature Granulocytes: 0.03 10*3/uL (ref 0.00–0.07)
Basophils Absolute: 0 10*3/uL (ref 0.0–0.1)
Basophils Relative: 0 %
Eosinophils Absolute: 0 10*3/uL (ref 0.0–0.5)
Eosinophils Relative: 1 %
HCT: 38.5 % — ABNORMAL LOW (ref 39.0–52.0)
Hemoglobin: 12.3 g/dL — ABNORMAL LOW (ref 13.0–17.0)
Immature Granulocytes: 0 %
Lymphocytes Relative: 11 %
Lymphs Abs: 1 10*3/uL (ref 0.7–4.0)
MCH: 29 pg (ref 26.0–34.0)
MCHC: 31.9 g/dL (ref 30.0–36.0)
MCV: 90.8 fL (ref 80.0–100.0)
Monocytes Absolute: 0.5 10*3/uL (ref 0.1–1.0)
Monocytes Relative: 6 %
Neutro Abs: 6.8 10*3/uL (ref 1.7–7.7)
Neutrophils Relative %: 82 %
Platelets: 205 10*3/uL (ref 150–400)
RBC: 4.24 MIL/uL (ref 4.22–5.81)
RDW: 15.6 % — ABNORMAL HIGH (ref 11.5–15.5)
WBC: 8.4 10*3/uL (ref 4.0–10.5)
nRBC: 0 % (ref 0.0–0.2)

## 2022-03-29 LAB — COMPREHENSIVE METABOLIC PANEL
ALT: 12 U/L (ref 0–44)
AST: 20 U/L (ref 15–41)
Albumin: 4 g/dL (ref 3.5–5.0)
Alkaline Phosphatase: 69 U/L (ref 38–126)
Anion gap: 12 (ref 5–15)
BUN: 55 mg/dL — ABNORMAL HIGH (ref 8–23)
CO2: 30 mmol/L (ref 22–32)
Calcium: 9.2 mg/dL (ref 8.9–10.3)
Chloride: 90 mmol/L — ABNORMAL LOW (ref 98–111)
Creatinine, Ser: 1.51 mg/dL — ABNORMAL HIGH (ref 0.61–1.24)
GFR, Estimated: 46 mL/min — ABNORMAL LOW (ref >60)
Glucose, Bld: 210 mg/dL — ABNORMAL HIGH (ref 70–99)
Potassium: 3.5 mmol/L (ref 3.5–5.1)
Sodium: 132 mmol/L — ABNORMAL LOW (ref 135–145)
Total Bilirubin: 1.5 mg/dL — ABNORMAL HIGH (ref 0.3–1.2)
Total Protein: 7 g/dL (ref 6.5–8.1)

## 2022-03-29 LAB — URINALYSIS, ROUTINE W REFLEX MICROSCOPIC
Bacteria, UA: NONE SEEN
Bilirubin Urine: NEGATIVE
Glucose, UA: >=500 mg/dL — AB
Hgb urine dipstick: NEGATIVE
Ketones, ur: 5 mg/dL — AB
Leukocytes,Ua: NEGATIVE
Nitrite: NEGATIVE
Protein, ur: NEGATIVE mg/dL
Specific Gravity, Urine: 1.015 (ref 1.005–1.030)
pH: 6 (ref 5.0–8.0)

## 2022-03-29 LAB — LIPASE, BLOOD: Lipase: 40 U/L (ref 11–51)

## 2022-03-29 NOTE — ED Triage Notes (Signed)
Pt here for  constipation x5 days. Pt has tried otc miralax, milk of magnesia, suppositories w/o relief. Last BM was 5/26. Pt only reports abd pain when eating.

## 2022-03-29 NOTE — ED Provider Triage Note (Signed)
  Emergency Medicine Provider Triage Evaluation Note  MRN:  [id number]  Arrival date & time: 03/29/22    Medically screening exam initiated at 10:15 PM.   CC:   Constipation.   HPI:  Aaron Sosa is a 81 y.o. year-old male presents to the ED with chief complaint of constipation x 5 days.  Has tried enemas and miralax without relief.  He denies vomiting.  Denies fever.  States that when he eats it makes him SOB.  History provided by History provided by patient. ROS:  -As included in HPI PE:   Vitals:   03/29/22 2124  BP: 102/77  Pulse: 99  Resp: 18  Temp: 98.6 F (37 C)  SpO2: 97%    Non-toxic appearing No respiratory distress Peripheral edema and skin changes of venous insufficiency MDM:  Based on signs and symptoms, constipation is highest on my differential, followed by obstruction. I've ordered labs and imaging in triage to expedite lab/diagnostic workup.  Patient was informed that the remainder of the evaluation will be completed by another provider, this initial triage assessment does not replace that evaluation, and the importance of remaining in the ED until their evaluation is complete.    Montine Circle, PA-C 03/29/22 2218

## 2022-03-30 ENCOUNTER — Emergency Department (HOSPITAL_COMMUNITY): Payer: Medicare HMO

## 2022-03-30 MED ORDER — IOHEXOL 300 MG/ML  SOLN
80.0000 mL | Freq: Once | INTRAMUSCULAR | Status: AC | PRN
  Administered 2022-03-30: 80 mL via INTRAVENOUS

## 2022-03-30 NOTE — Discharge Instructions (Signed)
Please take 6 capfuls of MiraLAX in a 8-16 oz glass of fluid over 2-4 hour period. The following day take 3 capfuls. On day 3 start taking 1 capful 3 times a day. Slowly cut back as needed until you have normal bowel movements.

## 2022-03-30 NOTE — ED Provider Notes (Signed)
[facility] EMERGENCY DEPARTMENT Provider Note  CSN: [id number] Arrival date & time: 03/29/22 2119  Chief Complaint(s) Constipation  HPI Aaron Sosa is a 81 y.o. male with a past medical history listed below including  diabetes, chronic systolic heart failure with a last EF of 25-30% in Jan 2023.   Constipation Severity:  Moderate Time since last bowel movement:  5 days Timing:  Constant Progression:  Unchanged Chronicity:  Recurrent Context: dehydration and medication   Relieved by:  Nothing Worsened by:  Nothing Ineffective treatments:  Enemas, laxatives, Miralax and stool softeners Associated symptoms: no nausea    Past Medical History Past Medical History:  Diagnosis Date   Arthritis    CHF (congestive heart failure) (HCC)    Diabetes mellitus without complication (HCC)    HOH (hard of hearing)    Lower extremity edema    Patient Active Problem List   Diagnosis Date Noted   Hypoxia    Shortness of breath    Acute on chronic combined systolic and diastolic CHF (congestive heart failure) ([place]) 11/06/2021   Elevated troponin 11/06/2021   Closed fracture of ramus of right pubis (HCC) [id number]   Chronic systolic CHF (congestive heart failure) ([place]) 11/05/2021   Leukocytosis 11/05/2021   Hyponatremia 11/05/2021   Type 2 diabetes mellitus with hyperglycemia ([place]) 11/05/2021   NSTEMI (non-ST elevated myocardial infarction) ([place]) 03/18/2020   Acute exacerbation of CHF (congestive heart failure) ([place]) 03/16/2020   Diabetic retinopathy ([place]) 05/23/2019   Myalgia due to statin 03/12/2019   Obesity (BMI 30.0-34.9) 12/30/2018   Essential hypertension 08/14/2018   Type 2 diabetes mellitus ([place]) 08/14/2018   Hyperlipidemia associated with type 2 diabetes mellitus ([place]) 08/14/2018   Lower extremity edema 08/14/2018   Hearing loss of both ears 08/14/2018   Home Medication(s) Prior to Admission medications   Medication Sig Start Date End Date Taking?  Authorizing Provider  acetaminophen (TYLENOL) 500 MG tablet Take 2 tablets (1,000 mg total) by mouth every 8 (eight) hours as needed for mild pain, moderate pain or headache. 03/18/20   Ezekiel Slocumb, DO  aspirin EC 81 MG EC tablet Take 1 tablet (81 mg total) by mouth daily. 03/19/20   Ezekiel Slocumb, DO  calcium carbonate (TUMS - DOSED IN MG ELEMENTAL CALCIUM) 500 MG chewable tablet Chew 1 tablet (200 mg of elemental calcium total) by mouth 3 (three) times daily as needed for indigestion or heartburn. 03/18/20   Ezekiel Slocumb, DO  clopidogrel (PLAVIX) 75 MG tablet Take 75 mg by mouth daily. 07/08/21   [provider]  docusate sodium (COLACE) 100 MG capsule Take 1 capsule (100 mg total) by mouth 2 (two) times daily as needed for mild constipation or moderate constipation. 03/18/20   Nicole Kindred A, DO  ENTRESTO 24-26 MG Take 1 tablet by mouth 2 (two) times daily. 09/12/21   [provider]  gabapentin (NEURONTIN) 300 MG capsule Take 1 capsule (300 mg total) by mouth 3 (three) times daily. 11/14/21   Sreenath, Sudheer B, MD  glipiZIDE (GLUCOTROL XL) 10 MG 24 hr tablet Take 10 mg by mouth daily. 10/15/21   [provider]  magnesium oxide (MAG-OX) 400 MG tablet Take 400 mg by mouth 2 (two) times daily.    [provider]  metFORMIN (GLUCOPHAGE-XR) 500 MG 24 hr tablet Take 2,000 mg by mouth daily with supper. With dinner 05/10/21 05/10/22  [provider]  metolazone (ZAROXOLYN) 5 MG tablet Take 5 mg by mouth as  directed. Take 1 tablet (5 mg total) by mouth every Tuesday, Thursday, Saturday, and Sunday 08/25/21   [provider]  metoprolol succinate (TOPROL-XL) 25 MG 24 hr tablet Take 25 mg by mouth daily. 10/28/21 10/28/22  [provider]  Multiple Vitamins-Minerals (PRESERVISION AREDS 2 PO) Take 1 tablet by mouth 2 (two) times daily.    [provider]  nitroGLYCERIN (NITROSTAT) 0.4 MG SL tablet Place under the tongue.  03/27/20   [provider]  Donald Siva test strip Use to check blood sugar up to 2 x daily 06/29/20   Karamalegos, Devonne Doughty, DO  polyethylene glycol powder (GLYCOLAX/MIRALAX) 17 GM/SCOOP powder Take 17 g by mouth daily as needed. Take 1 packet (17 g total) by mouth once daily as needed for Constipation Mix in 4-8ounces of fluid prior to taking. 03/27/20   [provider]  PROAIR HFA 108 (90 Base) MCG/ACT inhaler Inhale 2 puffs into the lungs every 4 (four) hours as needed for shortness of breath or wheezing. 03/03/20   [provider]  rosuvastatin (CRESTOR) 20 MG tablet Take 20 mg by mouth at bedtime. 10/27/21   [provider]  senna-docusate (SENOKOT-S) 8.6-50 MG tablet Take 2 tablets by mouth 2 (two) times daily as needed for constipation. 03/27/20   [provider]  torsemide 40 MG TABS Take 40 mg by mouth daily. 11/14/21   Sidney Ace, MD                                                                                                                                    Allergies Empagliflozin, Prednisone, Shrimp extract allergy skin test, Atorvastatin, Codeine, Dust mite extract, and Shrimp [shellfish allergy]  Review of Systems Review of Systems  Gastrointestinal:  Positive for constipation. Negative for nausea.  As noted in HPI  Physical Exam Vital Signs  I have reviewed the triage vital signs BP 104/78   Pulse 74   Temp 98.9 F (37.2 C)   Resp 18   SpO2 96%   Physical Exam Vitals reviewed.  Constitutional:      General: He is not in acute distress.    Appearance: He is well-developed. He is not diaphoretic.  HENT:     Head: Normocephalic and atraumatic.     Right Ear: External ear normal.     Left Ear: External ear normal.     Nose: Nose normal.     Mouth/Throat:     Mouth: Mucous membranes are moist.  Eyes:     General: No scleral icterus.    Conjunctiva/sclera: Conjunctivae normal.  Neck:     Trachea: Phonation  normal.  Cardiovascular:     Rate and Rhythm: Normal rate and regular rhythm.     Comments: With chronic skin changes from venous stasis  Pulmonary:     Effort: Pulmonary effort is normal. No respiratory distress.     Breath sounds: No stridor.  Abdominal:     General: There is no distension.     Tenderness: There is no abdominal tenderness. There is no guarding or rebound.  Musculoskeletal:        General: Normal range of motion.     Cervical back: Normal range of motion.     Right lower leg: 1+ Pitting Edema present.     Left lower leg: 1+ Pitting Edema present.  Neurological:     Mental Status: He is alert and oriented to person, place, and time.  Psychiatric:        Behavior: Behavior normal.    ED Results and Treatments Labs (all labs ordered are listed, but only abnormal results are displayed) Labs Reviewed  COMPREHENSIVE METABOLIC PANEL - Abnormal; Notable for the following components:      Result Value   Sodium 132 (*)    Chloride 90 (*)    Glucose, Bld 210 (*)    BUN 55 (*)    Creatinine, Ser 1.51 (*)    Total Bilirubin 1.5 (*)    GFR, Estimated 46 (*)    All other components within normal limits  CBC WITH DIFFERENTIAL/PLATELET - Abnormal; Notable for the following components:   Hemoglobin 12.3 (*)    HCT 38.5 (*)    RDW 15.6 (*)    All other components within normal limits  URINALYSIS, ROUTINE W REFLEX MICROSCOPIC - Abnormal; Notable for the following components:   Glucose, UA >=500 (*)    Ketones, ur 5 (*)    All other components within normal limits  LIPASE, BLOOD                                                                                                                         EKG  EKG Interpretation  Date/Time:    Ventricular Rate:    PR Interval:    QRS Duration:   QT Interval:    QTC Calculation:   R Axis:     Text Interpretation:         Radiology CT ABDOMEN PELVIS W CONTRAST  Result Date: 03/30/2022 CLINICAL DATA:  Bowel obstruction  suspected EXAM: CT ABDOMEN AND PELVIS WITH CONTRAST TECHNIQUE: Multidetector CT imaging of the abdomen and pelvis was performed using the standard protocol following bolus administration of intravenous contrast. RADIATION DOSE REDUCTION: This exam was performed according to the departmental dose-optimization program which includes automated exposure control, adjustment of the mA and/or kV according to patient size and/or use of iterative reconstruction technique. CONTRAST:  23mL OMNIPAQUE IOHEXOL 300 MG/ML  SOLN COMPARISON:  None Available. FINDINGS: Lower chest: Cardiomegaly. Coronary artery and aortic calcifications. No acute abnormality. Hepatobiliary: No focal hepatic abnormality. Gallbladder unremarkable. Pancreas: No focal abnormality or ductal dilatation. Spleen: No focal abnormality.  Normal size. Adrenals/Urinary Tract: No adrenal abnormality. No focal renal abnormality. No stones or hydronephrosis. Urinary bladder is unremarkable. Stomach/Bowel: Stomach, large and small bowel grossly unremarkable. Vascular/Lymphatic: Aortic atherosclerosis. No evidence of aneurysm or adenopathy. Reproductive: No visible  focal abnormality. Other: No free fluid or free air. Musculoskeletal: No acute bony abnormality. Old right superior and inferior pubic rami fractures. Severe degenerative changes in the lumbar spine. 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 Chronic appearing compression fracture at T10 with partial fusion across the T10-11 disc space. IMPRESSION: No acute findings in the abdomen or pelvis. Cardiomegaly, coronary artery disease.  Aortic atherosclerosis. Electronically Signed   By: Rolm Baptise M.D.   On: 03/30/2022 02:38    Pertinent labs & imaging results that were available during my care of the patient were reviewed by me and considered in my medical decision making (see MDM for details).  Medications Ordered in ED Medications  iohexol (OMNIPAQUE) 300 MG/ML solution 80 mL (80 mLs  Intravenous Contrast Given 03/30/22 0214)                                                                                                                                     Procedures Procedures  (including critical care time)  Medical Decision Making / ED Course    Complexity of Problem:  Co-morbidities/SDOH that complicate the patient evaluation/care: Noted above in HPI  Patient's presenting problem/concern, DDX, and MDM listed below: Constipation Likely constipation We will rule out small bowel obstruction  Hospitalization Considered:  Yes    Complexity of Data:   Laboratory Tests ordered listed below with my independent interpretation: CBC without leukocytosis or anemia. No significant electrolyte derangement.  Patient does have mild AKI   Imaging Studies ordered listed below with my independent interpretation: CT scan negative for bowel obstruction or serious intra-abdominal inflammatory/infectious process.  Patient has stool in the rectum and sigmoid colon.     ED Course:    Assessment, Add'l Intervention, and Reassessment: Constipation Offered disimpaction, but patient declined Will increase miralax regimen  AKI Likely from decreased PO intake Recommended increased gentle hydration Close PCP/Cardiology follow up  Final Clinical Impression(s) / ED Diagnoses Final diagnoses:  Other constipation   The patient appears reasonably screened and/or stabilized for discharge and I doubt any other medical condition or other [name] requiring further screening, evaluation, or treatment in the ED at this time prior to discharge. Safe for discharge with strict return precautions.  Disposition: Discharge  Condition: Good  I have discussed the results, Dx and Tx plan with the patient/family who expressed understanding and agree(s) with the plan. Discharge instructions discussed at length. The patient/family was given strict return precautions who verbalized understanding of  the instructions. No further questions at time of discharge.    ED Discharge Orders     None       Follow Up: Kirk Ruths, MD [place] [address] [phone]  Call  to schedule an appointment for close follow up, in 3-5 days to recheck renal function           This chart was  dictated using voice recognition software.  Despite best efforts to proofread,  errors can occur which can change the documentation meaning.    Fatima Blank, MD 03/30/22 [phone]

## 2022-04-28 ENCOUNTER — Inpatient Hospital Stay
Admission: EM | Admit: 2022-04-28 | Discharge: 2022-05-09 | DRG: 291 | Disposition: A | Discharge status: Home Health | Payer: Medicare HMO | Attending: Osteopathic Medicine | Admitting: Osteopathic Medicine

## 2022-04-28 ENCOUNTER — Other Ambulatory Visit: Payer: Self-pay

## 2022-04-28 ENCOUNTER — Emergency Department: Payer: Medicare HMO

## 2022-04-28 DIAGNOSIS — Z5320 Procedure and treatment not carried out because of patient's decision for unspecified reasons: Secondary | ICD-10-CM | POA: Diagnosis not present

## 2022-04-28 DIAGNOSIS — I2510 Atherosclerotic heart disease of native coronary artery without angina pectoris: Secondary | ICD-10-CM | POA: Diagnosis present

## 2022-04-28 DIAGNOSIS — Z87891 Personal history of nicotine dependence: Secondary | ICD-10-CM

## 2022-04-28 DIAGNOSIS — E1122 Type 2 diabetes mellitus with diabetic chronic kidney disease: Secondary | ICD-10-CM | POA: Diagnosis present

## 2022-04-28 DIAGNOSIS — Z9109 Other allergy status, other than to drugs and biological substances: Secondary | ICD-10-CM

## 2022-04-28 DIAGNOSIS — K5909 Other constipation: Secondary | ICD-10-CM | POA: Diagnosis present

## 2022-04-28 DIAGNOSIS — I959 Hypotension, unspecified: Secondary | ICD-10-CM

## 2022-04-28 DIAGNOSIS — Z91138 Patient's unintentional underdosing of medication regimen for other reason: Secondary | ICD-10-CM

## 2022-04-28 DIAGNOSIS — Z7982 Long term (current) use of aspirin: Secondary | ICD-10-CM

## 2022-04-28 DIAGNOSIS — R6881 Early satiety: Secondary | ICD-10-CM | POA: Diagnosis present

## 2022-04-28 DIAGNOSIS — E1165 Type 2 diabetes mellitus with hyperglycemia: Secondary | ICD-10-CM | POA: Diagnosis present

## 2022-04-28 DIAGNOSIS — I9589 Other hypotension: Secondary | ICD-10-CM | POA: Diagnosis present

## 2022-04-28 DIAGNOSIS — J9611 Chronic respiratory failure with hypoxia: Secondary | ICD-10-CM | POA: Diagnosis present

## 2022-04-28 DIAGNOSIS — Z91013 Allergy to seafood: Secondary | ICD-10-CM

## 2022-04-28 DIAGNOSIS — E876 Hypokalemia: Secondary | ICD-10-CM | POA: Diagnosis present

## 2022-04-28 DIAGNOSIS — R7989 Other specified abnormal findings of blood chemistry: Secondary | ICD-10-CM | POA: Diagnosis present

## 2022-04-28 DIAGNOSIS — Z66 Do not resuscitate: Secondary | ICD-10-CM | POA: Diagnosis not present

## 2022-04-28 DIAGNOSIS — R0602 Shortness of breath: Secondary | ICD-10-CM | POA: Diagnosis present

## 2022-04-28 DIAGNOSIS — N1831 Chronic kidney disease, stage 3a: Secondary | ICD-10-CM | POA: Diagnosis present

## 2022-04-28 DIAGNOSIS — I10 Essential (primary) hypertension: Secondary | ICD-10-CM | POA: Diagnosis not present

## 2022-04-28 DIAGNOSIS — I248 Other forms of acute ischemic heart disease: Secondary | ICD-10-CM | POA: Diagnosis present

## 2022-04-28 DIAGNOSIS — R778 Other specified abnormalities of plasma proteins: Secondary | ICD-10-CM | POA: Diagnosis not present

## 2022-04-28 DIAGNOSIS — Z885 Allergy status to narcotic agent status: Secondary | ICD-10-CM

## 2022-04-28 DIAGNOSIS — M1990 Unspecified osteoarthritis, unspecified site: Secondary | ICD-10-CM | POA: Diagnosis present

## 2022-04-28 DIAGNOSIS — I130 Hypertensive heart and chronic kidney disease with heart failure and stage 1 through stage 4 chronic kidney disease, or unspecified chronic kidney disease: Principal | ICD-10-CM | POA: Diagnosis present

## 2022-04-28 DIAGNOSIS — I255 Ischemic cardiomyopathy: Secondary | ICD-10-CM | POA: Diagnosis present

## 2022-04-28 DIAGNOSIS — Z833 Family history of diabetes mellitus: Secondary | ICD-10-CM | POA: Diagnosis not present

## 2022-04-28 DIAGNOSIS — Z8249 Family history of ischemic heart disease and other diseases of the circulatory system: Secondary | ICD-10-CM | POA: Diagnosis not present

## 2022-04-28 DIAGNOSIS — E1129 Type 2 diabetes mellitus with other diabetic kidney complication: Secondary | ICD-10-CM | POA: Diagnosis present

## 2022-04-28 DIAGNOSIS — T502X6A Underdosing of carbonic-anhydrase inhibitors, benzothiadiazides and other diuretics, initial encounter: Secondary | ICD-10-CM | POA: Diagnosis present

## 2022-04-28 DIAGNOSIS — I5043 Acute on chronic combined systolic (congestive) and diastolic (congestive) heart failure: Secondary | ICD-10-CM | POA: Diagnosis present

## 2022-04-28 DIAGNOSIS — F4325 Adjustment disorder with mixed disturbance of emotions and conduct: Secondary | ICD-10-CM | POA: Diagnosis not present

## 2022-04-28 DIAGNOSIS — Z79899 Other long term (current) drug therapy: Secondary | ICD-10-CM

## 2022-04-28 DIAGNOSIS — N189 Chronic kidney disease, unspecified: Secondary | ICD-10-CM | POA: Diagnosis present

## 2022-04-28 DIAGNOSIS — Z7902 Long term (current) use of antithrombotics/antiplatelets: Secondary | ICD-10-CM

## 2022-04-28 DIAGNOSIS — H9190 Unspecified hearing loss, unspecified ear: Secondary | ICD-10-CM | POA: Diagnosis present

## 2022-04-28 DIAGNOSIS — Z888 Allergy status to other drugs, medicaments and biological substances status: Secondary | ICD-10-CM

## 2022-04-28 DIAGNOSIS — I5022 Chronic systolic (congestive) heart failure: Secondary | ICD-10-CM

## 2022-04-28 DIAGNOSIS — K5900 Constipation, unspecified: Secondary | ICD-10-CM | POA: Diagnosis present

## 2022-04-28 DIAGNOSIS — E119 Type 2 diabetes mellitus without complications: Secondary | ICD-10-CM

## 2022-04-28 DIAGNOSIS — E785 Hyperlipidemia, unspecified: Secondary | ICD-10-CM | POA: Diagnosis present

## 2022-04-28 DIAGNOSIS — I509 Heart failure, unspecified: Principal | ICD-10-CM

## 2022-04-28 DIAGNOSIS — N183 Chronic kidney disease, stage 3 unspecified: Secondary | ICD-10-CM | POA: Diagnosis present

## 2022-04-28 DIAGNOSIS — Z7189 Other specified counseling: Secondary | ICD-10-CM

## 2022-04-28 DIAGNOSIS — Z7984 Long term (current) use of oral hypoglycemic drugs: Secondary | ICD-10-CM

## 2022-04-28 LAB — COMPREHENSIVE METABOLIC PANEL
ALT: 24 U/L (ref 0–44)
AST: 34 U/L (ref 15–41)
Albumin: 3.8 g/dL (ref 3.5–5.0)
Alkaline Phosphatase: 83 U/L (ref 38–126)
Anion gap: 14 (ref 5–15)
BUN: 34 mg/dL — ABNORMAL HIGH (ref 8–23)
CO2: 29 mmol/L (ref 22–32)
Calcium: 8.7 mg/dL — ABNORMAL LOW (ref 8.9–10.3)
Chloride: 90 mmol/L — ABNORMAL LOW (ref 98–111)
Creatinine, Ser: 1.49 mg/dL — ABNORMAL HIGH (ref 0.61–1.24)
GFR, Estimated: 47 mL/min — ABNORMAL LOW (ref >60)
Glucose, Bld: 253 mg/dL — ABNORMAL HIGH (ref 70–99)
Potassium: 3 mmol/L — ABNORMAL LOW (ref 3.5–5.1)
Sodium: 133 mmol/L — ABNORMAL LOW (ref 135–145)
Total Bilirubin: 1.7 mg/dL — ABNORMAL HIGH (ref 0.3–1.2)
Total Protein: 6.7 g/dL (ref 6.5–8.1)

## 2022-04-28 LAB — CBC
HCT: 42.1 % (ref 39.0–52.0)
Hemoglobin: 13.2 g/dL (ref 13.0–17.0)
MCH: 28.3 pg (ref 26.0–34.0)
MCHC: 31.4 g/dL (ref 30.0–36.0)
MCV: 90.1 fL (ref 80.0–100.0)
Platelets: 218 10*3/uL (ref 150–400)
RBC: 4.67 MIL/uL (ref 4.22–5.81)
RDW: 14.9 % (ref 11.5–15.5)
WBC: 8.2 10*3/uL (ref 4.0–10.5)
nRBC: 0 % (ref 0.0–0.2)

## 2022-04-28 LAB — TROPONIN I (HIGH SENSITIVITY)
Troponin I (High Sensitivity): 33 ng/L — ABNORMAL HIGH (ref <18)
Troponin I (High Sensitivity): 33 ng/L — ABNORMAL HIGH (ref <18)

## 2022-04-28 LAB — BRAIN NATRIURETIC PEPTIDE: B Natriuretic Peptide: 2501.1 pg/mL — ABNORMAL HIGH (ref 0.0–100.0)

## 2022-04-28 LAB — MAGNESIUM: Magnesium: 2.6 mg/dL — ABNORMAL HIGH (ref 1.7–2.4)

## 2022-04-28 LAB — CBG MONITORING, ED: Glucose-Capillary: 232 mg/dL — ABNORMAL HIGH (ref 70–99)

## 2022-04-28 MED ORDER — ASPIRIN 81 MG PO TBEC
81.0000 mg | DELAYED_RELEASE_TABLET | Freq: Every day | ORAL | Status: DC
  Administered 2022-04-29 – 2022-05-08 (×10): 81 mg via ORAL
  Filled 2022-04-28 (×11): qty 1

## 2022-04-28 MED ORDER — ROSUVASTATIN CALCIUM 10 MG PO TABS
20.0000 mg | ORAL_TABLET | Freq: Every day | ORAL | Status: DC
  Administered 2022-04-28 – 2022-05-08 (×10): 20 mg via ORAL
  Filled 2022-04-28 (×3): qty 2
  Filled 2022-04-28: qty 1
  Filled 2022-04-28 (×7): qty 2

## 2022-04-28 MED ORDER — METOPROLOL SUCCINATE ER 25 MG PO TB24
25.0000 mg | ORAL_TABLET | Freq: Every day | ORAL | Status: DC
  Administered 2022-05-01: 25 mg via ORAL
  Filled 2022-04-28 (×3): qty 1

## 2022-04-28 MED ORDER — POTASSIUM CHLORIDE CRYS ER 20 MEQ PO TBCR
40.0000 meq | EXTENDED_RELEASE_TABLET | ORAL | Status: AC
  Administered 2022-04-28 (×2): 40 meq via ORAL
  Filled 2022-04-28 (×2): qty 2

## 2022-04-28 MED ORDER — ALBUTEROL SULFATE (2.5 MG/3ML) 0.083% IN NEBU: 2.5000 mg | INHALATION_SOLUTION | RESPIRATORY_TRACT | Status: DC | PRN

## 2022-04-28 MED ORDER — ACETAMINOPHEN 325 MG PO TABS
650.0000 mg | ORAL_TABLET | Freq: Four times a day (QID) | ORAL | Status: DC | PRN
  Administered 2022-04-30 – 2022-05-03 (×2): 650 mg via ORAL
  Filled 2022-04-28 (×3): qty 2

## 2022-04-28 MED ORDER — NITROGLYCERIN 0.4 MG SL SUBL
0.4000 mg | SUBLINGUAL_TABLET | SUBLINGUAL | Status: DC | PRN
Start: 2022-04-28 — End: 2022-05-09

## 2022-04-28 MED ORDER — SACUBITRIL-VALSARTAN 24-26 MG PO TABS
1.0000 | ORAL_TABLET | Freq: Two times a day (BID) | ORAL | Status: DC
  Administered 2022-04-29: 1 via ORAL
  Filled 2022-04-28 (×3): qty 1

## 2022-04-28 MED ORDER — ENOXAPARIN SODIUM 40 MG/0.4ML IJ SOSY
40.0000 mg | PREFILLED_SYRINGE | INTRAMUSCULAR | Status: DC
Start: 2022-04-28 — End: 2022-05-03
  Administered 2022-04-28 – 2022-05-02 (×5): 40 mg via SUBCUTANEOUS
  Filled 2022-04-28 (×5): qty 0.4

## 2022-04-28 MED ORDER — POTASSIUM CHLORIDE 10 MEQ/100ML IV SOLN
10.0000 meq | Freq: Once | INTRAVENOUS | Status: AC
  Administered 2022-04-28: 10 meq via INTRAVENOUS
  Filled 2022-04-28: qty 100

## 2022-04-28 MED ORDER — ONDANSETRON HCL 4 MG/2ML IJ SOLN: 4.0000 mg | Freq: Three times a day (TID) | INTRAMUSCULAR | Status: DC | PRN

## 2022-04-28 MED ORDER — SENNA 8.6 MG PO TABS
1.0000 | ORAL_TABLET | Freq: Every day | ORAL | Status: DC
  Administered 2022-04-29 – 2022-05-01 (×3): 8.6 mg via ORAL
  Filled 2022-04-28 (×4): qty 1

## 2022-04-28 MED ORDER — DM-GUAIFENESIN ER 30-600 MG PO TB12
1.0000 | ORAL_TABLET | Freq: Two times a day (BID) | ORAL | Status: DC | PRN
  Administered 2022-05-08: 1 via ORAL
  Filled 2022-04-28: qty 1

## 2022-04-28 MED ORDER — OCUVITE-LUTEIN PO CAPS
1.0000 | ORAL_CAPSULE | Freq: Two times a day (BID) | ORAL | Status: DC
  Administered 2022-04-28 – 2022-05-08 (×20): 1 via ORAL
  Filled 2022-04-28 (×23): qty 1

## 2022-04-28 MED ORDER — CALCIUM CARBONATE ANTACID 500 MG PO CHEW: 1.0000 | CHEWABLE_TABLET | Freq: Three times a day (TID) | ORAL | Status: DC | PRN

## 2022-04-28 MED ORDER — INSULIN ASPART 100 UNIT/ML IJ SOLN
0.0000 [IU] | Freq: Three times a day (TID) | INTRAMUSCULAR | Status: DC
  Administered 2022-04-29 (×2): 2 [IU] via SUBCUTANEOUS
  Administered 2022-04-29 – 2022-04-30 (×2): 3 [IU] via SUBCUTANEOUS
  Administered 2022-04-30: 2 [IU] via SUBCUTANEOUS
  Administered 2022-05-01: 1 [IU] via SUBCUTANEOUS
  Administered 2022-05-01 (×2): 2 [IU] via SUBCUTANEOUS
  Administered 2022-05-02: 1 [IU] via SUBCUTANEOUS
  Administered 2022-05-02 (×2): 2 [IU] via SUBCUTANEOUS
  Administered 2022-05-03: 5 [IU] via SUBCUTANEOUS
  Administered 2022-05-03: 7 [IU] via SUBCUTANEOUS
  Administered 2022-05-03: 2 [IU] via SUBCUTANEOUS
  Administered 2022-05-04 (×2): 3 [IU] via SUBCUTANEOUS
  Administered 2022-05-05: 2 [IU] via SUBCUTANEOUS
  Administered 2022-05-05: 3 [IU] via SUBCUTANEOUS
  Administered 2022-05-06 (×3): 2 [IU] via SUBCUTANEOUS
  Administered 2022-05-07: 3 [IU] via SUBCUTANEOUS
  Administered 2022-05-07: 2 [IU] via SUBCUTANEOUS
  Administered 2022-05-07: 3 [IU] via SUBCUTANEOUS
  Administered 2022-05-08: 2 [IU] via SUBCUTANEOUS
  Administered 2022-05-08 (×2): 3 [IU] via SUBCUTANEOUS
  Filled 2022-04-28 (×27): qty 1

## 2022-04-28 MED ORDER — FUROSEMIDE 10 MG/ML IJ SOLN
60.0000 mg | Freq: Two times a day (BID) | INTRAMUSCULAR | Status: DC
  Administered 2022-04-28 – 2022-04-30 (×4): 60 mg via INTRAVENOUS
  Filled 2022-04-28 (×2): qty 6
  Filled 2022-04-28: qty 8

## 2022-04-28 MED ORDER — HYDRALAZINE HCL 20 MG/ML IJ SOLN: 5.0000 mg | INTRAMUSCULAR | Status: DC | PRN

## 2022-04-28 MED ORDER — CLOPIDOGREL BISULFATE 75 MG PO TABS
75.0000 mg | ORAL_TABLET | Freq: Every day | ORAL | Status: DC
  Administered 2022-04-28 – 2022-05-08 (×11): 75 mg via ORAL
  Filled 2022-04-28 (×12): qty 1

## 2022-04-28 MED ORDER — INSULIN ASPART 100 UNIT/ML IJ SOLN
0.0000 [IU] | Freq: Every day | INTRAMUSCULAR | Status: DC
  Administered 2022-04-28 – 2022-05-03 (×2): 2 [IU] via SUBCUTANEOUS
  Administered 2022-05-04: 3 [IU] via SUBCUTANEOUS
  Administered 2022-05-05 – 2022-05-06 (×2): 2 [IU] via SUBCUTANEOUS
  Filled 2022-04-28 (×5): qty 1

## 2022-04-28 MED ORDER — GABAPENTIN 300 MG PO CAPS
300.0000 mg | ORAL_CAPSULE | Freq: Three times a day (TID) | ORAL | Status: DC
  Administered 2022-04-28 – 2022-05-04 (×15): 300 mg via ORAL
  Filled 2022-04-28 (×16): qty 1

## 2022-04-28 MED ORDER — POLYETHYLENE GLYCOL 3350 17 G PO PACK
17.0000 g | PACK | Freq: Every day | ORAL | Status: DC
  Administered 2022-04-29 – 2022-05-05 (×7): 17 g via ORAL
  Filled 2022-04-28 (×8): qty 1

## 2022-04-28 MED ORDER — SENNA 8.6 MG PO TABS: 1.0000 | ORAL_TABLET | Freq: Every day | ORAL | Status: DC | PRN

## 2022-04-28 MED ORDER — POTASSIUM CHLORIDE 20 MEQ PO PACK: 40.0000 meq | PACK | ORAL | Status: DC

## 2022-04-28 MED ORDER — FUROSEMIDE 10 MG/ML IJ SOLN
60.0000 mg | Freq: Once | INTRAMUSCULAR | Status: DC
  Filled 2022-04-28: qty 8

## 2022-04-28 NOTE — ED Triage Notes (Signed)
Pt to ED from via [facility] for CHF exacerbation, distended abdomen and bilateral leg swelling. KC reports posterior lung crackles. Daughter states pt is not med compliant.

## 2022-04-28 NOTE — Assessment & Plan Note (Signed)
CAD and elevated troponin: Troponin level 33 --> 33, likely due to demand ischemia. -Continue aspirin, Plavix, Crestor -As needed nitroglycerin

## 2022-04-28 NOTE — Assessment & Plan Note (Signed)
See above

## 2022-04-28 NOTE — Assessment & Plan Note (Signed)
Patient has 3+ leg edema, positive JVD, elevated BNP 2501, crackles on auscultation, pulm edema by chest x-ray, clinically consistent with CHF exacerbation.  2D echo on 11/06/2021 showed EF 25--30% with grade 2 diastolic dysfunction.  Patient is taking his diuretics consistently.  -Will admit to tele bed as inpatient -Continue Entresto -Lasix 60 mg bid by IV -Daily weights -strict I/O's -Low salt diet -Fluid restriction -Obtain REDs Vest reading

## 2022-04-28 NOTE — Assessment & Plan Note (Signed)
-   Crestor 

## 2022-04-28 NOTE — ED Provider Triage Note (Signed)
Emergency Medicine Provider Triage Evaluation Note  Aaron Sosa , a 81 y.o. male  was evaluated in triage.  Pt complains of bilateral lower extremity swelling, abdominal distension, and shortness of breath. Sent from [facility] for evaluation for CHF exacerbation.  Physical Exam  BP 110/77   Pulse 93   Temp 98.9 F (37.2 C) (Oral)   Resp 18   SpO2 100%  Gen:   Awake, no distress   Resp:  Normal effort  MSK:   Moves extremities without difficulty  Other:    Medical Decision Making  Medically screening exam initiated at 12:03 PM.  Appropriate orders placed.  Aaron Sosa was informed that the remainder of the evaluation will be completed by another provider, this initial triage assessment does not replace that evaluation, and the importance of remaining in the ED until their evaluation is complete.    Aaron Pester, FNP 04/28/22 1407

## 2022-04-28 NOTE — Assessment & Plan Note (Signed)
-   MiraLAX and senokote

## 2022-04-28 NOTE — ED Triage Notes (Signed)
First nurse note: Sent by [facility] for CHF exacerbation. Increased sob over the past couple of weeks. C/o constipation. KC reports crackles in lungs, distended abdomen, and pedal edema.

## 2022-04-28 NOTE — Assessment & Plan Note (Signed)
-  IV hydralazine as needed -Patient is on IV Lasix, Entresto, -Metoprolol

## 2022-04-28 NOTE — Assessment & Plan Note (Signed)
recent A1c 10.4, poorly controlled.  Patient is taking metformin and glipizide at home -Sliding scale insulin

## 2022-04-28 NOTE — H&P (Signed)
History and Physical    Aaron Sosa [id number] DOB: 09-Jun-1941 DOA: 04/28/2022  Referring MD/NP/PA:   PCP: Kirk Ruths, MD   Patient coming from:  The patient is coming from home.  At baseline, pt is independent for most of ADL.        Chief Complaint: SOB  HPI: Aaron Sosa is a 81 y.o. 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 HLD, DM, HOH, CAD, CKD-3a, who presents with SOB.  Patient states that he has shortness of breath for almost 4 weeks which has been progressively worsening in the past several days.  Patient has dry cough, denies chest pain, fever or chills.  Patient has worsening bilateral lower leg edema and abdominal swelling.  Patient has orthopnea.  Denies nausea, vomiting, diarrhea or abdominal pain.  Patient is constipated in the past several days.  Per ED physician, family reported that patient has not been taking his diuretics consistently.   Data reviewed independently and ED Course: pt was found to have BNP 2501, troponin level 33 --> 33, renal function close to recent baseline, temperature normal, blood pressure 105/66, heart rate 93, 87, RR 25, oxygen saturation 96% on room air.  Chest x-ray showed mild pulmonary edema with small right pleural effusion.  Patient is admitted to telemetry bed as inpatient   EKG: I have personally reviewed.  Sinus rhythm, QTc 464, low voltage, poor R wave progression, Q wave in lead III/aVF.  Review of Systems:   General: no fevers, chills, no body weight gain, has fatigue HEENT: no blurry vision, hearing changes or sore throat Respiratory: has dyspnea, coughing, no wheezing CV: no chest pain, no palpitations GI: no nausea, vomiting, abdominal pain, diarrhea, has constipation GU: no dysuria, burning on urination, increased urinary frequency, hematuria  Ext: has leg edema Neuro: no unilateral weakness, numbness, or tingling, no vision change or hearing loss Skin: no rash, no skin tear. MSK: No  muscle spasm, no deformity, no limitation of range of movement in spin Heme: No easy bruising.  Travel history: No recent long distant travel.   Allergy:  Allergies  Allergen Reactions   Empagliflozin Other (See Comments)   Prednisone Other (See Comments) and Swelling    Fluid build up    Shrimp Extract Allergy Skin Test Anaphylaxis   Atorvastatin Other (See Comments)    myalgia Other reaction(s): Unknown myalgia   Codeine     Other reaction(s): Unknown Makes him feel crazy   Dust Mite Extract    Shrimp [Shellfish Allergy]     Past Medical History:  Diagnosis Date   Arthritis    CHF (congestive heart failure) (HCC)    Diabetes mellitus without complication (HCC)    HOH (hard of hearing)    Lower extremity edema     Past Surgical History:  Procedure Laterality Date   CATARACT EXTRACTION Left    CATARACT EXTRACTION W/PHACO Right 05/06/2015   Procedure: CATARACT EXTRACTION PHACO AND INTRAOCULAR LENS PLACEMENT (IOC);  Surgeon: Lyla Glassing, MD;  Location: [facility];  Service: Ophthalmology;  Laterality: Right;  [place] 1:14.7         AP   11.2          CDE   8.43    cassette lot [id number]   RETINAL DETACHMENT SURGERY     RIGHT/LEFT HEART CATH AND CORONARY ANGIOGRAPHY N/A 03/17/2020   Procedure: RIGHT/LEFT HEART CATH AND CORONARY ANGIOGRAPHY;  Surgeon: Corey Skains, MD;  Location: [place] [facility];  Service: Cardiovascular;  Laterality: N/A;   WRIST FRACTURE SURGERY  1958    Social History:  reports that he has quit smoking. His smoking use included cigarettes. He has a 10.00 pack-year smoking history. He has quit using smokeless tobacco. He reports current alcohol use of about 1.0 standard drink of alcohol per week. He reports that he does not use drugs.  Family History:  Family History  Problem Relation Age of Onset   Heart attack Mother    Diabetes Mellitus II Mother    Heart attack Father      Prior to Admission medications   Medication Sig Start Date End  Date Taking? Authorizing Provider  acetaminophen (TYLENOL) 500 MG tablet Take 2 tablets (1,000 mg total) by mouth every 8 (eight) hours as needed for mild pain, moderate pain or headache. 03/18/20   Pennie Banter, DO  aspirin EC 81 MG EC tablet Take 1 tablet (81 mg total) by mouth daily. 03/19/20   Pennie Banter, DO  calcium carbonate (TUMS - DOSED IN MG ELEMENTAL CALCIUM) 500 MG chewable tablet Chew 1 tablet (200 mg of elemental calcium total) by mouth 3 (three) times daily as needed for indigestion or heartburn. 03/18/20   Pennie Banter, DO  clopidogrel (PLAVIX) 75 MG tablet Take 75 mg by mouth daily. 07/08/21   [provider]  docusate sodium (COLACE) 100 MG capsule Take 1 capsule (100 mg total) by mouth 2 (two) times daily as needed for mild constipation or moderate constipation. 03/18/20   Esaw Grandchild A, DO  ENTRESTO 24-26 MG Take 1 tablet by mouth 2 (two) times daily. 09/12/21   [provider]  gabapentin (NEURONTIN) 300 MG capsule Take 1 capsule (300 mg total) by mouth 3 (three) times daily. 11/14/21   Sreenath, Sudheer B, MD  glipiZIDE (GLUCOTROL XL) 10 MG 24 hr tablet Take 10 mg by mouth daily. 10/15/21   [provider]  magnesium oxide (MAG-OX) 400 MG tablet Take 400 mg by mouth 2 (two) times daily.    [provider]  metFORMIN (GLUCOPHAGE-XR) 500 MG 24 hr tablet Take 2,000 mg by mouth daily with supper. With dinner 05/10/21 05/10/22  [provider]  metolazone (ZAROXOLYN) 5 MG tablet Take 5 mg by mouth as directed. Take 1 tablet (5 mg total) by mouth every Tuesday, Thursday, Saturday, and Sunday 08/25/21   [provider]  metoprolol succinate (TOPROL-XL) 25 MG 24 hr tablet Take 25 mg by mouth daily. 10/28/21 10/28/22  [provider]  Multiple Vitamins-Minerals (PRESERVISION AREDS 2 PO) Take 1 tablet by mouth 2 (two) times daily.    [provider]  nitroGLYCERIN (NITROSTAT) 0.4 MG SL tablet Place under the  tongue. 03/27/20   [provider]  Koren Bound test strip Use to check blood sugar up to 2 x daily 06/29/20   Karamalegos, Netta Neat, DO  polyethylene glycol powder (GLYCOLAX/MIRALAX) 17 GM/SCOOP powder Take 17 g by mouth daily as needed. Take 1 packet (17 g total) by mouth once daily as needed for Constipation Mix in 4-8ounces of fluid prior to taking. 03/27/20   [provider]  PROAIR HFA 108 (90 Base) MCG/ACT inhaler Inhale 2 puffs into the lungs every 4 (four) hours as needed for shortness of breath or wheezing. 03/03/20   [provider]  rosuvastatin (CRESTOR) 20 MG tablet Take 20 mg by mouth at bedtime. 10/27/21   [provider]  senna-docusate (SENOKOT-S) 8.6-50 MG tablet Take 2 tablets by mouth 2 (  two) times daily as needed for constipation. 03/27/20   [provider]  torsemide 40 MG TABS Take 40 mg by mouth daily. 11/14/21   Sidney Ace, MD    Physical Exam: Vitals:   04/28/22 1500 04/28/22 1800 04/28/22 1815 04/28/22 1830  BP: 105/66 93/73  97/73  Pulse: 87 78 80 83  Resp: (!) 25  11 (!) 27  Temp:      TempSrc:      SpO2: 96% 100% 100% 100%   General: Not in acute distress HEENT:       Eyes: PERRL, EOMI, no scleral icterus.       ENT: No discharge from the ears and nose, no pharynx injection, no tonsillar enlargement.        Neck: Positive JVD, no bruit, no mass felt. Heme: No neck lymph node enlargement. Cardiac: S1/S2, RRR, No murmurs, No gallops or rubs. Respiratory: Fine crackles bilaterally GI: Soft, nondistended, nontender, no rebound pain, no organomegaly, BS present. GU: No hematuria Ext: 3+ pitting leg edema bilaterally. 1+DP/PT pulse bilaterally. Musculoskeletal: No joint deformities, No joint redness or warmth, no limitation of ROM in spin. Skin: No rashes.  Neuro: Alert, oriented X3, cranial nerves II-XII grossly intact, moves all extremities normally.  Psych: Patient is not psychotic, no suicidal or  hemocidal ideation.  Labs on Admission: I have personally reviewed following labs and imaging studies  CBC: Recent Labs  Lab 04/28/22 1203  WBC 8.2  HGB 13.2  HCT 42.1  MCV 90.1  PLT 99991111   Basic Metabolic Panel: Recent Labs  Lab 04/28/22 1203 04/28/22 1517  NA 133*  --   K 3.0*  --   CL 90*  --   CO2 29  --   GLUCOSE 253*  --   BUN 34*  --   CREATININE 1.49*  --   CALCIUM 8.7*  --   MG  --  2.6*   GFR: CrCl cannot be calculated (Unknown ideal weight.). Liver Function Tests: Recent Labs  Lab 04/28/22 1203  AST 34  ALT 24  ALKPHOS 83  BILITOT 1.7*  PROT 6.7  ALBUMIN 3.8   No results for input(s): "LIPASE", "AMYLASE" in the last 168 hours. No results for input(s): "AMMONIA" in the last 168 hours. Coagulation Profile: No results for input(s): "INR", "PROTIME" in the last 168 hours. Cardiac Enzymes: No results for input(s): "CKTOTAL", "CKMB", "CKMBINDEX", "TROPONINI" in the last 168 hours. BNP (last 3 results) No results for input(s): "PROBNP" in the last 8760 hours. HbA1C: No results for input(s): "HGBA1C" in the last 72 hours. CBG: No results for input(s): "GLUCAP" in the last 168 hours. Lipid Profile: No results for input(s): "CHOL", "HDL", "LDLCALC", "TRIG", "CHOLHDL", "LDLDIRECT" in the last 72 hours. Thyroid Function Tests: No results for input(s): "TSH", "T4TOTAL", "FREET4", "T3FREE", "THYROIDAB" in the last 72 hours. Anemia Panel: No results for input(s): "VITAMINB12", "FOLATE", "FERRITIN", "TIBC", "IRON", "RETICCTPCT" in the last 72 hours. Urine analysis:    Component Value Date/Time   COLORURINE YELLOW 03/29/2022 2228   APPEARANCEUR CLEAR 03/29/2022 2228   LABSPEC 1.015 03/29/2022 2228   PHURINE 6.0 03/29/2022 2228   GLUCOSEU >=500 (A) 03/29/2022 2228   HGBUR NEGATIVE 03/29/2022 2228   BILIRUBINUR NEGATIVE 03/29/2022 2228   KETONESUR 5 (A) 03/29/2022 2228   PROTEINUR NEGATIVE 03/29/2022 2228   NITRITE NEGATIVE 03/29/2022 2228    LEUKOCYTESUR NEGATIVE 03/29/2022 2228   Sepsis Labs: @LABRCNTIP (procalcitonin:4,lacticidven:4) )No results found for this or any previous visit (from the past 240 hour(s)).  Radiological Exams on Admission: DG Chest 2 View  Result Date: 04/28/2022 CLINICAL DATA:  Shortness of breath EXAM: CHEST - 2 VIEW COMPARISON:  Radiograph 11/05/2021 FINDINGS: Unchanged cardiomegaly. There are mild bilateral interstitial opacities. There is a small right pleural effusion and adjacent basilar atelectasis. No pneumothorax. No acute osseous abnormality. IMPRESSION: Mild pulmonary edema and small right pleural effusion with adjacent atelectasis. Unchanged cardiomegaly. Electronically Signed   By: Caprice Renshaw M.D.   On: 04/28/2022 12:50      Assessment/Plan Principal Problem:   Acute on chronic combined systolic and diastolic congestive heart failure (HCC) Active Problems:   CAD (coronary artery disease)   Elevated troponin   Essential hypertension   Type II diabetes mellitus with renal manifestations (HCC)   Chronic kidney disease, stage 3a (HCC)   HLD (hyperlipidemia)   Hypokalemia   Constipation   Principal Problem:   Acute on chronic combined systolic and diastolic congestive heart failure (HCC) Active Problems:   CAD (coronary artery disease)   Elevated troponin   Essential hypertension   Type II diabetes mellitus with renal manifestations (HCC)   Chronic kidney disease, stage 3a (HCC)   HLD (hyperlipidemia)   Hypokalemia   Constipation   Assessment and Plan: * Acute on chronic combined systolic and diastolic congestive heart failure (HCC) Patient has 3+ leg edema, positive JVD, elevated BNP 2501, crackles on auscultation, pulm edema by chest x-ray, clinically consistent with CHF exacerbation.  2D echo on 11/06/2021 showed EF 25--30% with grade 2 diastolic dysfunction.  Patient is taking his diuretics consistently.  -Will admit to tele bed as inpatient -Continue Entresto -Lasix 60 mg  bid by IV -Daily weights -strict I/O's -Low salt diet -Fluid restriction -Obtain REDs Vest reading    CAD (coronary artery disease) CAD and elevated troponin: Troponin level 33 --> 33, likely due to demand ischemia. -Continue aspirin, Plavix, Crestor -As needed nitroglycerin  Elevated troponin - See above  Essential hypertension -IV hydralazine as needed -Patient is on IV Lasix, Entresto, -Metoprolol  Type II diabetes mellitus with renal manifestations (HCC)  recent A1c 10.4, poorly controlled.  Patient is taking metformin and glipizide at home -Sliding scale insulin  Chronic kidney disease, stage 3a (HCC) Stable compared with recent baseline.  Recent baseline creatinine 1.51 on 03/29/2022.  His creatinine is 1.49, BUN 34.  Patient had creatinine 0.96 on 11/13/2021. -Monitor renal function closely with BMP  HLD (hyperlipidemia) - Crestor  Hypokalemia Potassium is 3.0 -Repleted potassium -Check magnesium level --> 2.6  Constipation - MiraLAX and senokote             DVT ppx: SQ Lovenox  Code Status: Full code  Family Communication: not done, no family member is at bed side.    Disposition Plan:  Anticipate discharge back to previous environment  Consults called:  none  Admission status and Level of care: Telemetry Medical:    as inpt       Severity of Illness:  The appropriate patient status for this patient is INPATIENT. Inpatient status is judged to be reasonable and necessary in order to provide the required intensity of service to ensure the patient's safety. The patient's presenting symptoms, physical exam findings, and initial radiographic and laboratory data in the context of their chronic comorbidities is felt to place them at high risk for further clinical deterioration. Furthermore, it is not anticipated that the patient will be medically stable for discharge from the hospital within 2 midnights of admission.   * I certify that  at the point  of admission it is my clinical judgment that the patient will require inpatient hospital care spanning beyond 2 midnights from the point of admission due to high intensity of service, high risk for further deterioration and high frequency of surveillance required.*       Date of Service 04/28/2022    Ivor Costa [facility]   If 7PM-7AM, please contact night-coverage [email or web address] 04/28/2022, 7:17 PM

## 2022-04-28 NOTE — Assessment & Plan Note (Signed)
Potassium is 3.0 -Repleted potassium -Check magnesium level --> 2.6

## 2022-04-28 NOTE — ED Provider Notes (Signed)
[facility] Provider Note    Event Date/Time   First MD Initiated Contact with Patient 04/28/22 1543     (approximate)   History   Shortness of Breath   HPI  Aaron Sosa is a 81 y.o. male who on review of cardiology note from May 19 hypertension, chronic systolic CHF NYHA class III with global EF of 25%, coronary artery disease with severe diffuse three-vessel CAD not amenable to PCI or CABG, type 2 diabetes, hyperlipidemia  Is been experiencing feeling of increased swelling and shortness of breath for several weeks.  His daughter is with him and provides better history, reports for the last 3 to 4 weeks have noticed considerable swelling in his legs and he is also for about 2 weeks now felt like his stomach or bowels are slow he has early satiety not eating well has a constant feeling of sort of a constipation.  No nausea or vomiting.  Feels like he is "swollen" in his abdomen as well  He is not always compliant with his medications.  He is supposed to be on torsemide  He has a history of congestive heart failure  No fevers or chills.  No productive cough.  Some shortness of breath he notices it quite a bit if he eats dinner and lays down      Physical Exam   Triage Vital Signs: ED Triage Vitals  Enc Vitals Group     BP 04/28/22 1201 110/77     Pulse Rate 04/28/22 1158 93     Resp 04/28/22 1158 18     Temp 04/28/22 1158 98.9 F (37.2 C)     Temp Source 04/28/22 1158 Oral     SpO2 04/28/22 1158 100 %     Weight --      Height --      Head Circumference --      Peak Flow --      Pain Score 04/28/22 1158 0     Pain Loc --      Pain Edu? --      Excl. in GC? --     Most recent vital signs: Vitals:   04/28/22 1201 04/28/22 1500  BP: 110/77 105/66  Pulse:  87  Resp:  (!) 25  Temp:    SpO2:  96%     General: Awake, no distress.  Very pleasant.  Appears somewhat chronically ill, no extremis though CV:  Good peripheral perfusion though  slightly cool to touch in his distal extremities.  Resp:  Normal effort.  Mild crackles in the bases bilaterally.  Very slight tachypnea and respiratory rate approximately 2025.  He does not appear to be in any extremis.  Lowest oxygen saturation observed 90% on room air, placed on 2 L nasal cannula saturation 99% Abd:    Mildly distended.  Nontender through all quadrants. Other:  Patient has 4+ bilateral lower extremity pitting edema to the level at least the knees bilaterally but seems to extend up towards his lower abdominal wall as well.  Consistent with anasarca  Moderate JVD   ED Results / Procedures / Treatments   Labs (all labs ordered are listed, but only abnormal results are displayed) Labs Reviewed  COMPREHENSIVE METABOLIC PANEL - Abnormal; Notable for the following components:      Result Value   Sodium 133 (*)    Potassium 3.0 (*)    Chloride 90 (*)    Glucose, Bld 253 (*)    BUN  34 (*)    Creatinine, Ser 1.49 (*)    Calcium 8.7 (*)    Total Bilirubin 1.7 (*)    GFR, Estimated 47 (*)    All other components within normal limits  BRAIN NATRIURETIC PEPTIDE - Abnormal; Notable for the following components:   B Natriuretic Peptide 2,501.1 (*)    All other components within normal limits  TROPONIN I (HIGH SENSITIVITY) - Abnormal; Notable for the following components:   Troponin I (High Sensitivity) 33 (*)    All other components within normal limits  TROPONIN I (HIGH SENSITIVITY) - Abnormal; Notable for the following components:   Troponin I (High Sensitivity) 33 (*)    All other components within normal limits  CBC  HEMOGLOBIN A1C     EKG  Interpreted by me at noon heart rate 90 QRS 110 QTc 460 normal sinus rhythm, probable old inferior infarct possible old anterior infarct.  No evidence of acute STEMI or acute ischemia   RADIOLOGY  Chest x-ray interpreted by me as vascular fullness and vascular congestion  DG Chest 2 View  Result Date: 04/28/2022 CLINICAL  DATA:  Shortness of breath EXAM: CHEST - 2 VIEW COMPARISON:  Radiograph 11/05/2021 FINDINGS: Unchanged cardiomegaly. There are mild bilateral interstitial opacities. There is a small right pleural effusion and adjacent basilar atelectasis. No pneumothorax. No acute osseous abnormality. IMPRESSION: Mild pulmonary edema and small right pleural effusion with adjacent atelectasis. Unchanged cardiomegaly. Electronically Signed   By: Caprice Renshaw M.D.   On: 04/28/2022 12:50    Radiologist report does no pulmonary edema present     PROCEDURES:  Critical Care performed: No  Procedures   MEDICATIONS ORDERED IN ED: Medications  potassium chloride 10 mEq in 100 mL IVPB (has no administration in time range)  furosemide (LASIX) injection 60 mg (has no administration in time range)  potassium chloride SA (KLOR-CON M) CR tablet 40 mEq (has no administration in time range)  insulin aspart (novoLOG) injection 0-9 Units (has no administration in time range)  insulin aspart (novoLOG) injection 0-5 Units (has no administration in time range)     IMPRESSION / MDM / ASSESSMENT AND PLAN / ED COURSE  I reviewed the triage vital signs and the nursing notes.                              Differential diagnosis includes, but is not limited to, volume overload, CHF exacerbation, pneumonia, anemia, pneumothorax, cardiac failure, etc.  EKG without evidence of acute ischemia no chest pain.  His findings including afebrile status normal white count and clinical presentation argue against pneumonia, appears most consistent with volume overload and CHF with a known reduced ejection fraction.  Will start Lasix, his blood pressure 105/66, but on previous note cardiology clinic it appears that he has hypotension with blood pressure in the 90s on a regular basis.  Also replete potassium.  Given the patient's extensive anasarca, pulmonary edema on chest x-ray and hypokalemia I feel the patient needs observation or further  admission for treatment and further work-up plus consider further diuresis  I have consulted with the hospitalist Dr. Clyde Lundborg, who after consultation is agreeable with plan for admission  Patient's presentation is most consistent with acute presentation with potential threat to life or bodily function.  The patient is on the cardiac monitor to evaluate for evidence of arrhythmia and/or significant heart rate changes.  Labs interpreted as normal CBC elevated BNP at 2500, mild  to moderate hypokalemia   Patient and his daughter both understanding agreeable for plan for admission with anticipation of need for diuresis  FINAL CLINICAL IMPRESSION(S) / ED DIAGNOSES   Final diagnoses:  Acute on chronic congestive heart failure, unspecified heart failure type (HCC)  Hypokalemia     Rx / DC Orders   ED Discharge Orders     None        Note:  This document was prepared using Dragon voice recognition software and may include unintentional dictation errors.   Sharyn Creamer, MD 04/28/22 [phone]

## 2022-04-28 NOTE — Assessment & Plan Note (Signed)
Stable compared with recent baseline.  Recent baseline creatinine 1.51 on 03/29/2022.  His creatinine is 1.49, BUN 34.  Patient had creatinine 0.96 on 11/13/2021. -Monitor renal function closely with BMP

## 2022-04-29 ENCOUNTER — Encounter: Payer: Self-pay | Admitting: Internal Medicine

## 2022-04-29 DIAGNOSIS — I5043 Acute on chronic combined systolic (congestive) and diastolic (congestive) heart failure: Secondary | ICD-10-CM | POA: Diagnosis not present

## 2022-04-29 DIAGNOSIS — J9611 Chronic respiratory failure with hypoxia: Secondary | ICD-10-CM | POA: Diagnosis not present

## 2022-04-29 DIAGNOSIS — E876 Hypokalemia: Secondary | ICD-10-CM | POA: Diagnosis not present

## 2022-04-29 LAB — GLUCOSE, CAPILLARY
Glucose-Capillary: 170 mg/dL — ABNORMAL HIGH (ref 70–99)
Glucose-Capillary: 144 mg/dL — ABNORMAL HIGH (ref 70–99)

## 2022-04-29 LAB — BASIC METABOLIC PANEL
Anion gap: 9 (ref 5–15)
BUN: 34 mg/dL — ABNORMAL HIGH (ref 8–23)
CO2: 31 mmol/L (ref 22–32)
Calcium: 8.4 mg/dL — ABNORMAL LOW (ref 8.9–10.3)
Chloride: 96 mmol/L — ABNORMAL LOW (ref 98–111)
Creatinine, Ser: 1.33 mg/dL — ABNORMAL HIGH (ref 0.61–1.24)
GFR, Estimated: 54 mL/min — ABNORMAL LOW (ref >60)
Glucose, Bld: 171 mg/dL — ABNORMAL HIGH (ref 70–99)
Potassium: 3 mmol/L — ABNORMAL LOW (ref 3.5–5.1)
Sodium: 136 mmol/L (ref 135–145)

## 2022-04-29 LAB — LIPID PANEL
Cholesterol: 153 mg/dL (ref 0–200)
HDL: 44 mg/dL (ref >40)
LDL Cholesterol: 98 mg/dL (ref 0–99)
Total CHOL/HDL Ratio: 3.5 RATIO
Triglycerides: 57 mg/dL (ref <150)
VLDL: 11 mg/dL (ref 0–40)

## 2022-04-29 LAB — POTASSIUM: Potassium: 4.3 mmol/L (ref 3.5–5.1)

## 2022-04-29 LAB — CBG MONITORING, ED
Glucose-Capillary: 186 mg/dL — ABNORMAL HIGH (ref 70–99)
Glucose-Capillary: 250 mg/dL — ABNORMAL HIGH (ref 70–99)

## 2022-04-29 LAB — MAGNESIUM: Magnesium: 2.6 mg/dL — ABNORMAL HIGH (ref 1.7–2.4)

## 2022-04-29 MED ORDER — LACTULOSE 10 GM/15ML PO SOLN
20.0000 g | Freq: Once | ORAL | Status: AC
  Administered 2022-04-29: 20 g via ORAL
  Filled 2022-04-29: qty 30

## 2022-04-29 MED ORDER — POTASSIUM CHLORIDE 10 MEQ/100ML IV SOLN: 10.0000 meq | INTRAVENOUS | Status: DC

## 2022-04-29 MED ORDER — POTASSIUM CHLORIDE CRYS ER 20 MEQ PO TBCR
40.0000 meq | EXTENDED_RELEASE_TABLET | ORAL | Status: AC
  Administered 2022-04-29 (×3): 40 meq via ORAL
  Filled 2022-04-29 (×2): qty 2

## 2022-04-29 MED ORDER — POTASSIUM CHLORIDE CRYS ER 20 MEQ PO TBCR
40.0000 meq | EXTENDED_RELEASE_TABLET | ORAL | Status: DC
  Filled 2022-04-29: qty 2

## 2022-04-29 NOTE — Hospital Course (Signed)
Aaron Sosa is a 81 y.o. 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 HLD, DM, HOH, CAD, CKD-3a, who presents with SOB. Echocardiogram performed on 11/06/2021 showed ejection fraction of 5 to 30%.  With grade 2 diastolic dysfunction. Chest x-ray showed volume overload, BNP 2501, troponin 33. 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

## 2022-04-29 NOTE — Progress Notes (Signed)
  Progress Note   Patient: Aaron Sosa:[id number] DOB: September 09, 1941 DOA: 04/28/2022     1 DOS: the patient was seen and examined on 04/29/2022   Brief hospital course: Aaron Sosa is a 81 y.o. 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 HLD, DM, HOH, CAD, CKD-3a, who presents with SOB. Echocardiogram performed on 11/06/2021 showed ejection fraction of 5 to 30%.  With grade 2 diastolic dysfunction. Chest x-ray showed volume overload, BNP 2501, troponin 33. 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  Assessment and Plan: Acute on chronic combined systolic and diastolic congestive heart failure. Chronic hypoxemic respite failure. Elevated troponin secondary to congestive heart failure exacerbation. Patient has a clear evidence of exacerbation of congestive heart failure.  He is started on Lasix 80 mg IV twice a day.  He is diuresing well. Patient is also on 2 L oxygen, which is chronic in nature. Continue home dose of Entresto and beta-blocker.  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 Hypokalemia. Patient is placed on sliding scale for now, hold off oral diabetic medicines. We will monitor glucose, may need long-acting insulin. Renal function still stable on diuretics.  Patient developed hypokalemia, will replete orally, recheck potassium level this afternoon.  Patient probably will need more potassium.  Central hypertension. Continue beta-blocker.  Chronic constipation. Patient has difficulty with bowel movements, continue MiraLAX and senna, give her dose of lactulose.     Subjective:  Patient is on 2 L oxygen, short of breath seem to be improving. Feel constipated, no nausea vomiting abdominal pain.  Physical Exam: Vitals:   04/29/22 0535 04/29/22 0600 04/29/22 0710 04/29/22 1000  BP: (!) 88/57 (!) 88/68 91/70 95/78   Pulse:  76 75  76  Resp:   11 20  Temp:      TempSrc:      SpO2:  99% 100% 100%   General exam: Appears calm and comfortable  Respiratory system: Fine crackles in the base. Respiratory effort normal. Cardiovascular system: S1 & S2 heard, RRR. No JVD, murmurs, rubs, gallops or clicks. No pedal edema. Gastrointestinal system: Abdomen is nondistended, soft and nontender. No organomegaly or masses felt. Normal bowel sounds heard. Central nervous system: Alert and oriented. No focal neurological deficits. Extremities: Symmetric 5 x 5 power. Skin: No rashes, lesions or ulcers Psychiatry: Judgement and insight appear normal. Mood & affect appropriate.   Data Reviewed:  Reviewed chest x-ray, lab results.  Family Communication:   Disposition: Status is: Inpatient Remains inpatient appropriate because: Severity of disease, IV treatment.  Planned Discharge Destination: Home with Home Health    Time spent: 55 minutes  Author: , MD 04/29/2022 12:32 PM  For on call review [email or web address].

## 2022-04-30 DIAGNOSIS — I9589 Other hypotension: Secondary | ICD-10-CM

## 2022-04-30 DIAGNOSIS — I959 Hypotension, unspecified: Secondary | ICD-10-CM

## 2022-04-30 DIAGNOSIS — I5043 Acute on chronic combined systolic (congestive) and diastolic (congestive) heart failure: Secondary | ICD-10-CM | POA: Diagnosis not present

## 2022-04-30 DIAGNOSIS — N1831 Chronic kidney disease, stage 3a: Secondary | ICD-10-CM | POA: Diagnosis not present

## 2022-04-30 LAB — BASIC METABOLIC PANEL
Anion gap: 6 (ref 5–15)
BUN: 35 mg/dL — ABNORMAL HIGH (ref 8–23)
CO2: 30 mmol/L (ref 22–32)
Calcium: 8.1 mg/dL — ABNORMAL LOW (ref 8.9–10.3)
Chloride: 98 mmol/L (ref 98–111)
Creatinine, Ser: 1.22 mg/dL (ref 0.61–1.24)
GFR, Estimated: 60 mL/min — ABNORMAL LOW (ref >60)
Glucose, Bld: 110 mg/dL — ABNORMAL HIGH (ref 70–99)
Potassium: 3.8 mmol/L (ref 3.5–5.1)
Sodium: 134 mmol/L — ABNORMAL LOW (ref 135–145)

## 2022-04-30 LAB — MAGNESIUM: Magnesium: 2.5 mg/dL — ABNORMAL HIGH (ref 1.7–2.4)

## 2022-04-30 LAB — GLUCOSE, CAPILLARY
Glucose-Capillary: 102 mg/dL — ABNORMAL HIGH (ref 70–99)
Glucose-Capillary: 163 mg/dL — ABNORMAL HIGH (ref 70–99)
Glucose-Capillary: 214 mg/dL — ABNORMAL HIGH (ref 70–99)
Glucose-Capillary: 162 mg/dL — ABNORMAL HIGH (ref 70–99)

## 2022-04-30 MED ORDER — ALBUMIN HUMAN 25 % IV SOLN
12.5000 g | Freq: Once | INTRAVENOUS | Status: AC
Start: 2022-04-30 — End: 2022-04-30
  Administered 2022-04-30: 12.5 g via INTRAVENOUS
  Filled 2022-04-30: qty 50

## 2022-04-30 MED ORDER — LACTULOSE 10 GM/15ML PO SOLN
20.0000 g | Freq: Two times a day (BID) | ORAL | Status: AC
  Administered 2022-04-30 (×2): 20 g via ORAL
  Filled 2022-04-30 (×2): qty 30

## 2022-04-30 MED ORDER — ALBUMIN HUMAN 25 % IV SOLN
25.0000 g | Freq: Once | INTRAVENOUS | Status: AC
  Administered 2022-04-30: 25 g via INTRAVENOUS
  Filled 2022-04-30: qty 100

## 2022-04-30 MED ORDER — MIDODRINE HCL 5 MG PO TABS
5.0000 mg | ORAL_TABLET | Freq: Three times a day (TID) | ORAL | Status: DC
  Administered 2022-04-30 – 2022-05-08 (×26): 5 mg via ORAL
  Filled 2022-04-30 (×27): qty 1

## 2022-04-30 MED ORDER — FUROSEMIDE 10 MG/ML IJ SOLN
40.0000 mg | Freq: Two times a day (BID) | INTRAMUSCULAR | Status: DC
  Administered 2022-05-01: 40 mg via INTRAVENOUS
  Filled 2022-04-30: qty 4

## 2022-04-30 NOTE — Consult Note (Signed)
[facility]  CARDIOLOGY CONSULT NOTE  Patient ID: Aaron Sosa MRN: [id number] DOB/AGE: July 14, 1941 81 y.o.  Admit date: 04/28/2022 Referring Physician Chipper Herb Primary Physician [facility] Primary Cardiologist Gwen Pounds Reason for Consultation congestive heart failure  HPI: 81 year old gentleman referred for evaluation of acute on chronic systolic congestive heart failure.  The patient has known coronary artery disease.  Cardiac catheterization 03/15/2020 revealed severe three-vessel coronary artery disease not amenable to percutaneous or surgical revascularization.  The patient has known ischemic cardiomyopathy with LVEF 25 to 30% by 2D echocardiogram 11/06/2021.  The patient is followed by Dr. Gwen Pounds with chronic systolic congestive heart failure, HFrEF.  The patient currently is on torsemide 100 mg daily, metolazone Tuesday, Thursday, Saturday and Sunday, and dapagliflozin.  He was previously on metoprolol succinate and Entresto which were discontinued due to low blood pressure.  The patient presents to [name] ED with 3 to 4-week history of worsening shortness of breath, lower extremity edema, and increasing abdominal girth consistent with acute on chronic systolic congestive heart failure.  The patient is started on furosemide 40 mg IV twice daily with diuresis and clinical improvement.  Patient denies chest pain.  High-sensitivity troponin was 33 and 33.  BNP was 2501.  ECG reveals sinus rhythm at 72 bpm.  Patient has chronic kidney disease with BUN and creatinine 34 and 1.33, respectively.  Blood pressure remains low, 82/58, patient mentating well.  Review of systems complete and found to be negative unless listed above     Past Medical History:  Diagnosis Date   Arthritis    CHF (congestive heart failure) (HCC)    Diabetes mellitus without complication (HCC)    HOH (hard of hearing)    Lower extremity edema     Past Surgical History:  Procedure Laterality Date   CATARACT EXTRACTION Left     CATARACT EXTRACTION W/PHACO Right 05/06/2015   Procedure: CATARACT EXTRACTION PHACO AND INTRAOCULAR LENS PLACEMENT (IOC);  Surgeon: Lia Hopping, MD;  Location: [facility] ORS;  Service: Ophthalmology;  Laterality: Right;  [place] 1:14.7         AP   11.2          CDE   8.43    cassette lot #[id number]   RETINAL DETACHMENT SURGERY     RIGHT/LEFT HEART CATH AND CORONARY ANGIOGRAPHY N/A 03/17/2020   Procedure: RIGHT/LEFT HEART CATH AND CORONARY ANGIOGRAPHY;  Surgeon: Lamar Blinks, MD;  Location: [facility] INVASIVE CV LAB;  Service: Cardiovascular;  Laterality: N/A;   WRIST FRACTURE SURGERY  1958    Medications Prior to Admission  Medication Sig Dispense Refill Last Dose   acetaminophen (TYLENOL) 500 MG tablet Take 2 tablets (1,000 mg total) by mouth every 8 (eight) hours as needed for mild pain, moderate pain or headache. 30 tablet 0 Unknown at PRN   aspirin EC 81 MG EC tablet Take 1 tablet (81 mg total) by mouth daily.   04/28/2022 at 0800   calcium carbonate (TUMS - DOSED IN MG ELEMENTAL CALCIUM) 500 MG chewable tablet Chew 1 tablet (200 mg of elemental calcium total) by mouth 3 (three) times daily as needed for indigestion or heartburn.   Unknown at PRN   clopidogrel (PLAVIX) 75 MG tablet Take 75 mg by mouth daily.   Past Week at Unknown   dapagliflozin propanediol (FARXIGA) 5 MG TABS tablet Take 5 mg by mouth daily.   Past Week at Unknown   docusate sodium (COLACE) 100 MG capsule Take 1 capsule (100 mg total) by mouth 2 (two)  times daily as needed for mild constipation or moderate constipation. 10 capsule 0 Unknown at PRN   fluticasone-salmeterol (ADVAIR) 100-50 MCG/ACT AEPB Inhale 1 puff into the lungs 2 (two) times daily.   Unknown at Unknown   glipiZIDE (GLUCOTROL XL) 10 MG 24 hr tablet Take 10 mg by mouth daily.   Past Week at Unknown   magnesium oxide (MAG-OX) 400 MG tablet Take 400 mg by mouth 2 (two) times daily.      metFORMIN (GLUCOPHAGE-XR) 500 MG 24 hr tablet Take 1,000 mg by mouth daily with  supper.   Unknown at Unknown   metolazone (ZAROXOLYN) 5 MG tablet Take 5 mg by mouth See admin instructions. Take 1 tablet (5 mg total) by mouth every Tuesday, Thursday, Saturday, and Sunday   Past Week at Unknown   nitroGLYCERIN (NITROSTAT) 0.4 MG SL tablet Place 0.4 mg under the tongue every 5 (five) minutes as needed for chest pain.   Unknown at PRN   [facility] ULTRA test strip Use to check blood sugar up to 2 x daily 50 each 12    polyethylene glycol powder (GLYCOLAX/MIRALAX) 17 GM/SCOOP powder Take 17 g by mouth daily as needed. Take 1 packet (17 g total) by mouth once daily as needed for Constipation Mix in 4-8ounces of fluid prior to taking.   Unknown at PRN   potassium chloride (KLOR-CON M) 10 MEQ tablet Take 10 mEq by mouth daily as needed (supplementation).   Unknown at PRN   rosuvastatin (CRESTOR) 20 MG tablet Take 20 mg by mouth at bedtime.   Past Week at Unknown   senna-docusate (SENOKOT-S) 8.6-50 MG tablet Take 2 tablets by mouth 2 (two) times daily as needed for constipation.   Unknown at PRN   torsemide (DEMADEX) 100 MG tablet Take 100 mg by mouth daily.   Past Week at Unknown   gabapentin (NEURONTIN) 300 MG capsule Take 1 capsule (300 mg total) by mouth 3 (three) times daily. (Patient not taking: Reported on 04/28/2022)   Not Taking   Social History   Socioeconomic History   Marital status: Widowed    Spouse name: Not on file   Number of children: Not on file   Years of education: High School   Highest education level: High school graduate  Occupational History   Not on file  Tobacco Use   Smoking status: Former    Packs/day: 2.00    Years: 5.00    Total pack years: 10.00    Types: Cigarettes   Smokeless tobacco: Former  Building services engineer Use: Never used  Substance and Sexual Activity   Alcohol use: Yes    Alcohol/week: 1.0 standard drink of alcohol    Types: 1 Cans of beer per week   Drug use: Never   Sexual activity: Not on file  Other Topics Concern   Not on  file  Social History Narrative   Not on file   Social Determinants of Health   Financial Resource Strain: Not on file  Food Insecurity: Not on file  Transportation Needs: Not on file  Physical Activity: Inactive (07/16/2019)   Exercise Vital Sign    Days of Exercise per Week: 0 days    Minutes of Exercise per Session: 0 min  Stress: Not on file  Social Connections: Not on file  Intimate Partner Violence: Not on file    Family History  Problem Relation Age of Onset   Heart attack Mother    Diabetes Mellitus II Mother  Heart attack Father       Review of systems complete and found to be negative unless listed above      PHYSICAL EXAM  General: Well developed, well nourished, in no acute distress HEENT:  Normocephalic and atramatic Neck:  No JVD.  Lungs: Clear bilaterally to auscultation and percussion. Heart: HRRR . Normal S1 and S2 without gallops or murmurs.  Abdomen: Bowel sounds are positive, abdomen soft and non-tender  Msk:  Back normal, normal gait. Normal strength and tone for age. Extremities: No clubbing, cyanosis or edema.   Neuro: Alert and oriented X 3. Psych:  Good affect, responds appropriately  Labs:   Lab Results  Component Value Date   WBC 8.2 04/28/2022   HGB 13.2 04/28/2022   HCT 42.1 04/28/2022   MCV 90.1 04/28/2022   PLT 218 04/28/2022    Recent Labs  Lab 04/28/22 1203 04/29/22 0419 04/30/22 0425  NA 133*   < > 134*  K 3.0*   < > 3.8  CL 90*   < > 98  CO2 29   < > 30  BUN 34*   < > 35*  CREATININE 1.49*   < > 1.22  CALCIUM 8.7*   < > 8.1*  PROT 6.7  --   --   BILITOT 1.7*  --   --   ALKPHOS 83  --   --   ALT 24  --   --   AST 34  --   --   GLUCOSE 253*   < > 110*   < > = values in this interval not displayed.   No results found for: "CKTOTAL", "CKMB", "CKMBINDEX", "TROPONINI"  Lab Results  Component Value Date   CHOL 153 04/29/2022   CHOL 171 03/16/2020   CHOL 234 (H) 12/02/2019   Lab Results  Component Value Date    HDL 44 04/29/2022   HDL 43 03/16/2020   HDL 57 12/02/2019   Lab Results  Component Value Date   LDLCALC 98 04/29/2022   LDLCALC 103 (H) 03/16/2020   LDLCALC 151 (H) 12/02/2019   Lab Results  Component Value Date   TRIG 57 04/29/2022   TRIG 123 03/16/2020   TRIG 140 12/02/2019   Lab Results  Component Value Date   CHOLHDL 3.5 04/29/2022   CHOLHDL 4.0 03/16/2020   CHOLHDL 4.1 12/02/2019   No results found for: "LDLDIRECT"    Radiology: DG Chest 2 View  Result Date: 04/28/2022 CLINICAL DATA:  Shortness of breath EXAM: CHEST - 2 VIEW COMPARISON:  Radiograph 11/05/2021 FINDINGS: Unchanged cardiomegaly. There are mild bilateral interstitial opacities. There is a small right pleural effusion and adjacent basilar atelectasis. No pneumothorax. No acute osseous abnormality. IMPRESSION: Mild pulmonary edema and small right pleural effusion with adjacent atelectasis. Unchanged cardiomegaly. Electronically Signed   By: Maurine Simmering M.D.   On: 04/28/2022 12:50    EKG: Sinus rhythm 72 bpm  ASSESSMENT AND PLAN:   1.  Acute on chronic systolic congestive heart failure, HFrEF, LVEF 25 to 30% 11/06/2021, receiving furosemide 40 mg IV every 12 hours, with adequate diuresis, net output 1600 cc 04/28/2022, 840 cc 04/29/2022, 257 cc 04/30/2022 2.  Coronary artery disease, severe three-vessel CAD by cardiac catheterization 03/15/2020, not amenable to percutaneous or surgical revascularization, currently chest pain-free, with unremarkable high-sensitivity troponin 33 and 33.  ECG reveals sinus rhythm without ischemic ST-T wave changes. 3.  Chronic kidney disease stage IIIa 4.  Low blood pressure, on midodrine  Recommendations  1.  Agree with overall current therapy 2.  Continue diuresis 3.  Carefully monitor renal status 4.  Agree with trial of low-dose metoprolol succinate  Signed: Isaias Cowman MD,PhD, [name] 04/30/2022, 11:11 AM

## 2022-04-30 NOTE — Progress Notes (Signed)
  Progress Note   Patient: Aaron Sosa NLG:[id number] DOB: 10-Apr-1941 DOA: 04/28/2022     2 DOS: the patient was seen and examined on 04/30/2022   Brief hospital course: Aaron Sosa is a 81 y.o. 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 HLD, DM, HOH, CAD, CKD-3a, who presents with SOB. Echocardiogram performed on 11/06/2021 showed ejection fraction of 25 to 30%.  With grade 2 diastolic dysfunction. Chest x-ray showed volume overload, BNP 2501, troponin 33. 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 7/2.  Blood pressure is low, gave albumin, discontinue Entresto, add midodrine.  Cardiology consulted  Assessment and Plan: Acute on chronic combined systolic and diastolic congestive heart failure. Hypotension. Chronic hypoxemic respite failure. Elevated troponin secondary to congestive heart failure exacerbation. Patient shortness of breath is improving, however, still has significant orthopnea and leg edema. He diuresed well, but blood pressure is low today.  We will give albumin.  Also added midodrine. Discontinue Entresto.  Also ask cardiology to see the patient today.   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 Hypokalemia. Potassium normalized, renal function stable. Continue sliding scale insulin for glucose  Essential hypertension. Beta-blocker on hold today due to low blood pressure.   Chronic constipation. Still constipated, no bowel movement after giving multiple stool softeners.  We will schedule lactulose twice a day.        Subjective: Patient shortness breath seem to be better today, but still had a significant orthopnea.  Leg edema is improving. Still constipated, no bowel movement after giving 1 dose of lactulose and senna.  Physical Exam: Vitals:   04/30/22 0723 04/30/22 0800 04/30/22 0900 04/30/22 0955  BP: (!) 83/56    (!) 82/58  Pulse: 62   72  Resp: (!) 22 15 (!) 23 20  Temp: 97.6 F (36.4 C)   97.6 F (36.4 C)  TempSrc: Oral   Oral  SpO2: 100%   100%  Weight:      Height:       General exam: Appears calm and comfortable  Respiratory system: A few crackles in the base. Respiratory effort normal. Cardiovascular system: S1 & S2 heard, RRR. No JVD, murmurs, rubs, gallops or clicks.  Gastrointestinal system: Abdomen is nondistended, soft and nontender. No organomegaly or masses felt. Normal bowel sounds heard. Central nervous system: Alert and oriented. No focal neurological deficits. Extremities: 2+ leg edema Skin: No rashes, lesions or ulcers Psychiatry: Judgement and insight appear normal. Mood & affect appropriate.   Data Reviewed:  Lab results reviewed  Family Communication: Daughter updated.  Disposition: Status is: Inpatient Remains inpatient appropriate because: Severity of disease, hypotension.  IV Lasix  Planned Discharge Destination: Home with Home Health    Time spent: 55 minutes  Author: Marrion Coy, MD 04/30/2022 10:04 AM  For on call review [email or web address].

## 2022-04-30 NOTE — Progress Notes (Signed)
   04/30/22 2000  Clinical Encounter Type  Visited With Patient and family together  Visit Type Initial  Referral From Nurse  Consult/Referral To Chaplain   Chaplain responded to nurse consult. Chaplain  provided compassionate presence and reflective listening as patient and daughter spoke about health challenges. Chaplain left AD paperwork for patient and family to discuss and decide next steps. Chaplain is available for follow up if needed.

## 2022-04-30 NOTE — Progress Notes (Signed)
PT Cancellation Note  Patient Details Name: ARDIAN HABERLAND MRN: [id number] DOB: 1941/05/24   Cancelled Treatment:    Reason Eval/Treat Not Completed: Patient not medically ready;Other (comment) (Patient consult received and reviewed. Per nursing is a hold until stabilized for mobility. Will attempt again at later time/date as appropriate.)   Precious Bard, PT, DPT  04/30/2022, 10:40 AM

## 2022-05-01 DIAGNOSIS — N1831 Chronic kidney disease, stage 3a: Secondary | ICD-10-CM | POA: Diagnosis not present

## 2022-05-01 DIAGNOSIS — I9589 Other hypotension: Secondary | ICD-10-CM | POA: Diagnosis not present

## 2022-05-01 DIAGNOSIS — I5043 Acute on chronic combined systolic (congestive) and diastolic (congestive) heart failure: Secondary | ICD-10-CM | POA: Diagnosis not present

## 2022-05-01 LAB — GLUCOSE, CAPILLARY
Glucose-Capillary: 154 mg/dL — ABNORMAL HIGH (ref 70–99)
Glucose-Capillary: 189 mg/dL — ABNORMAL HIGH (ref 70–99)
Glucose-Capillary: 145 mg/dL — ABNORMAL HIGH (ref 70–99)
Glucose-Capillary: 178 mg/dL — ABNORMAL HIGH (ref 70–99)

## 2022-05-01 LAB — HEMOGLOBIN A1C
Hgb A1c MFr Bld: 8.2 % — ABNORMAL HIGH (ref 4.8–5.6)
Mean Plasma Glucose: 189 mg/dL

## 2022-05-01 LAB — BASIC METABOLIC PANEL
Anion gap: 5 (ref 5–15)
BUN: 38 mg/dL — ABNORMAL HIGH (ref 8–23)
CO2: 31 mmol/L (ref 22–32)
Calcium: 8.4 mg/dL — ABNORMAL LOW (ref 8.9–10.3)
Chloride: 98 mmol/L (ref 98–111)
Creatinine, Ser: 1.26 mg/dL — ABNORMAL HIGH (ref 0.61–1.24)
GFR, Estimated: 58 mL/min — ABNORMAL LOW (ref >60)
Glucose, Bld: 145 mg/dL — ABNORMAL HIGH (ref 70–99)
Potassium: 3.9 mmol/L (ref 3.5–5.1)
Sodium: 134 mmol/L — ABNORMAL LOW (ref 135–145)

## 2022-05-01 LAB — MAGNESIUM: Magnesium: 2.5 mg/dL — ABNORMAL HIGH (ref 1.7–2.4)

## 2022-05-01 MED ORDER — FUROSEMIDE 10 MG/ML IJ SOLN
20.0000 mg | Freq: Once | INTRAMUSCULAR | Status: AC
  Administered 2022-05-01: 20 mg via INTRAVENOUS
  Filled 2022-05-01: qty 2

## 2022-05-01 MED ORDER — SENNOSIDES-DOCUSATE SODIUM 8.6-50 MG PO TABS
2.0000 | ORAL_TABLET | Freq: Two times a day (BID) | ORAL | Status: DC
  Administered 2022-05-01 – 2022-05-08 (×11): 2 via ORAL
  Filled 2022-05-01 (×11): qty 2

## 2022-05-01 MED ORDER — SIMETHICONE 80 MG PO CHEW: 80.0000 mg | CHEWABLE_TABLET | Freq: Four times a day (QID) | ORAL | Status: AC | PRN

## 2022-05-01 NOTE — Care Management Important Message (Signed)
Important Message  Patient Details  Name: CADON RACZKA MRN: [id number] Date of Birth: 28-Mar-1941   Medicare Important Message Given:  N/A - LOS <3 / Initial given by admissions     Johnell Comings 05/01/2022, 8:55 AM

## 2022-05-01 NOTE — Evaluation (Signed)
Occupational Therapy Evaluation Patient Details Name: Aaron Sosa MRN: [id number] DOB: [birth year] Today's Date: 05/01/2022   History of Present Illness Patient is an 81 year old male who presents with shortness of breath for approximately 4 weeks which has worsened in past few days. Patient has orthopnea and constipation. upon admission to ED patient with CHF exacerbation. PMH includes arthritis, CHF, DM, HOH, LE edema, obesity, leukocytosis, myalgia, SOB.   Clinical Impression   Patient presenting with decreased Ind in self care, balance, functional mobility/transfers, endurance, and safety awareness. Patient reports being mod I with use of SPC for mobility and furniture cruising at baseline. Pt lives with daughter and reports independence in self care . Daughter performs IADLs.  Patient currently functioning at supervision - min guard for short distance ambulation with SPC to bathroom to practice functional transfer. Pt's daughter in room reports ambulation/balance is close to baseline but pt with decreased endurance. Patient will benefit from acute OT to increase overall independence in the areas of ADLs, functional mobility, and safety awareness in order to safely discharge home with family.      Recommendations for follow up therapy are one component of a multi-disciplinary discharge planning process, led by the attending physician.  Recommendations may be updated based on patient status, additional functional criteria and insurance authorization.   Follow Up Recommendations  No OT follow up    Assistance Recommended at Discharge Set up Supervision/Assistance  Patient can return home with the following A little help with walking and/or transfers;A little help with bathing/dressing/bathroom;Help with stairs or ramp for entrance;Assist for transportation;Assistance with cooking/housework    Functional Status Assessment  Patient has had a recent decline in their functional status and  demonstrates the ability to make significant improvements in function in a reasonable and predictable amount of time.  Equipment Recommendations  None recommended by OT       Precautions / Restrictions Precautions Precautions: Fall Restrictions Weight Bearing Restrictions: No      Mobility Bed Mobility Overal bed mobility: Needs Assistance Bed Mobility: Supine to Sit     Supine to sit: Modified independent (Device/Increase time)     General bed mobility comments: seated in recliner chair at beginning/end of session    Transfers Overall transfer level: Needs assistance Equipment used: Straight cane Transfers: Sit to/from Stand, Bed to chair/wheelchair/BSC Sit to Stand: Supervision     Step pivot transfers: Min guard            Balance Overall balance assessment: Needs assistance Sitting-balance support: Feet supported Sitting balance-Leahy Scale: Normal     Standing balance support: Reliant on assistive device for balance Standing balance-Leahy Scale: Good                             ADL either performed or assessed with clinical judgement   ADL Overall ADL's : Needs assistance/impaired                                       General ADL Comments: supervision - min guard for functional mobility with use of SPC and toilet transfers. Pt's daughter reports ambulation and transfers are at baseline but pt does display increased fatigue with tasks.     Vision Patient Visual Report: No change from baseline              Pertinent Vitals/Pain Pain Assessment  Pain Assessment: No/denies pain     Extremity/Trunk Assessment Upper Extremity Assessment Upper Extremity Assessment: Generalized weakness   Lower Extremity Assessment Lower Extremity Assessment: Generalized weakness   Cervical / Trunk Assessment Cervical / Trunk Assessment: Normal   Communication Communication Communication: HOH   Cognition Arousal/Alertness:  Awake/alert Behavior During Therapy: WFL for tasks assessed/performed Overall Cognitive Status: Within Functional Limits for tasks assessed                                                  Home Living Family/patient expects to be discharged to:: Private residence Living Arrangements: Children Available Help at Discharge: Family;Available PRN/intermittently Type of Home: House Home Access: Stairs to enter Entrance Stairs-Number of Steps: 1+1+threshold Entrance Stairs-Rails: None Home Layout: One level     Bathroom Shower/Tub: Chief Strategy Officer: Standard     Home Equipment: Agricultural consultant (2 wheels);Rollator (4 wheels);Cane - single point;Crutches;BSC/3in1;Grab bars - tub/shower;Wheelchair - manual          Prior Functioning/Environment Prior Level of Function : Independent/Modified Independent;History of Falls (last six months)                        OT Problem List: Decreased strength;Decreased activity tolerance;Decreased knowledge of use of DME or AE;Impaired balance (sitting and/or standing);Decreased safety awareness      OT Treatment/Interventions: Self-care/ADL training;Balance training;Therapeutic exercise;Therapeutic activities;Energy conservation;Patient/family education    OT Goals(Current goals can be found in the care plan section) Acute Rehab OT Goals Patient Stated Goal: to go home OT Goal Formulation: With patient/family Time For Goal Achievement: 05/15/22 Potential to Achieve Goals: Good ADL Goals Pt Will Perform Grooming: with modified independence;standing Pt Will Perform Lower Body Dressing: with modified independence;sit to/from stand Pt Will Transfer to Toilet: with modified independence;ambulating Pt Will Perform Toileting - Clothing Manipulation and hygiene: with modified independence;sit to/from stand  OT Frequency: Min 2X/week       AM-PAC OT "6 Clicks" Daily Activity     Outcome Measure Help  from another person eating meals?: None Help from another person taking care of personal grooming?: None Help from another person toileting, which includes using toliet, bedpan, or urinal?: A Little Help from another person bathing (including washing, rinsing, drying)?: A Little Help from another person to put on and taking off regular upper body clothing?: None Help from another person to put on and taking off regular lower body clothing?: A Little 6 Click Score: 21   End of Session Equipment Utilized During Treatment: Other (comment) [facility]) Nurse Communication: Mobility status  Activity Tolerance: Patient tolerated treatment well Patient left: with call bell/phone within reach;in chair;with chair alarm set;with family/visitor present  OT Visit Diagnosis: Unsteadiness on feet (R26.81);Muscle weakness (generalized) (M62.81)                Time: 1601-0932 OT Time Calculation (min): 15 min Charges:  OT General Charges $OT Visit: 1 Visit OT Evaluation $OT Eval Low Complexity: 1 Low OT Treatments $Self Care/Home Management : 8-22 mins Jackquline Denmark, MS, OTR/L , CBIS ascom [phone]  05/01/22, 12:49 PM

## 2022-05-01 NOTE — Progress Notes (Signed)
  Progress Note   Patient: Aaron Sosa MWU:[id number] DOB: [birth year] DOA: 04/28/2022     3 DOS: the patient was seen and examined on 05/01/2022   Brief hospital course: Aaron Sosa is a 81 y.o. 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 HLD, DM, HOH, CAD, CKD-3a, who presents with SOB. Echocardiogram performed on 11/06/2021 showed ejection fraction of 25 to 30%.  With grade 2 diastolic dysfunction. Chest x-ray showed volume overload, BNP 2501, troponin 33. 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 7/2.  Blood pressure is low, gave albumin, discontinue Entresto, add midodrine.  Cardiology consulted  Assessment and Plan: Acute on chronic combined systolic and diastolic congestive heart failure. Hypotension. Chronic hypoxemic respite failure. Elevated troponin secondary to congestive heart failure exacerbation. Blood pressure still borderline, discontinue metoprolol, continue hold off Entresto.  Continue midodrine.  Restart IV Lasix is a lower dose of 40 mg twice a day.  Appreciate cardiology consult.   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 Hypokalemia. Conditions are stable.  Potassium better.   Essential hypertension. Beta-blocker on hold today due to low blood pressure.   Chronic constipation. Had a bowel movement after giving lactulose.      Subjective:  Patient feels well, slept last night. Still has significant short of breath with exertion.  Physical Exam: Vitals:   05/01/22 0302 05/01/22 0311 05/01/22 0743 05/01/22 1152  BP: 96/68  110/63 (!) 89/66  Pulse: 85  80 79  Resp: 15  19 18   Temp: 98.3 F (36.8 C)  98 F (36.7 C) (!) 96.1 F (35.6 C)  TempSrc: Oral   Oral  SpO2: 98%  100% 93%  Weight:  92.5 kg    Height:       General exam: Appears calm and comfortable  Respiratory system: Crackles in the base.  Respiratory effort normal. Cardiovascular system: S1 & S2 heard, RRR. No JVD, murmurs, rubs, gallops or clicks. No pedal edema. Gastrointestinal system: Abdomen is nondistended, soft and nontender. No organomegaly or masses felt. Normal bowel sounds heard. Central nervous system: Alert and oriented. No focal neurological deficits. Extremities: Symmetric 5 x 5 power. Skin: No rashes, lesions or ulcers Psychiatry: Judgement and insight appear normal. Mood & affect appropriate.   Data Reviewed:  Lab results reviewed.  Family Communication:   Disposition: Status is: Inpatient Remains inpatient appropriate because: Severity of disease, IV treatment.  Planned Discharge Destination: Home with Home Health    Time spent: 35 minutes  Author: , MD 05/01/2022 1:08 PM  For on call review [email or web address].

## 2022-05-01 NOTE — Evaluation (Signed)
Physical Therapy Evaluation Patient Details Name: Aaron Sosa MRN: [id number] DOB: 1941/04/27 Today's Date: 05/01/2022  History of Present Illness  Patient is an 81 year old male who presents with shortness of breath for approximately 4 weeks which has worsened in past few days. Patient has orthopnea and constipation. upon admission to ED patient with CHF exacerbation. PMH includes arthritis, CHF, DM, HOH, LE edema, obesity, leukocytosis, myalgia, SOB.   Clinical Impression    Patient alert, agreeable to PT, HOH but denied any pain currently. Pt reported at baseline he lives with his daughter but home alone during the day. ModI/I for ADLs, has been ambulating with a SPC recently, and no longer drives.  He was able to perform bed mobility with bed rails modI. Sit <> stand with SPC to transfer to recliner, CGA. Does reach for external support and educated on use of RW with mobility for now. Sit <> stand twice from recliner with CGA, cued for hand placement, and overall the patient was able to ambulate ~28ft, CGA. Does occasionally hit his toe on RW due to wide BOS, cued for RW placement and activity pacing. Pt placed on room air, spO2 90% or greater throughout, RN notified.  Overall the patient demonstrated deficits (see "PT Problem List") that impede the patient's functional abilities, safety, and mobility and would benefit from skilled PT intervention. Recommendation is HHPT with intermittent supervision/assistance to maximize safety and function.      Recommendations for follow up therapy are one component of a multi-disciplinary discharge planning process, led by the attending physician.  Recommendations may be updated based on patient status, additional functional criteria and insurance authorization.  Follow Up Recommendations Home health PT      Assistance Recommended at Discharge Intermittent Supervision/Assistance  Patient can return home with the following  Assistance with  cooking/housework;Assist for transportation;Help with stairs or ramp for entrance    Equipment Recommendations None recommended by PT  Recommendations for Other Services       Functional Status Assessment Patient has had a recent decline in their functional status and demonstrates the ability to make significant improvements in function in a reasonable and predictable amount of time.     Precautions / Restrictions Precautions Precautions: Fall Restrictions Weight Bearing Restrictions: No      Mobility  Bed Mobility Overal bed mobility: Needs Assistance Bed Mobility: Supine to Sit     Supine to sit: Modified independent (Device/Increase time)     General bed mobility comments: use of bed rails    Transfers Overall transfer level: Needs assistance Equipment used: Straight cane, Rolling walker (2 wheels) Transfers: Sit to/from Stand, Bed to chair/wheelchair/BSC Sit to Stand: Min guard   Step pivot transfers: Min guard       General transfer comment: from EOB with SPC, from recliner with RW twice, CGA    Ambulation/Gait   Gait Distance (Feet):  (7ft, and then after a seated rest break, additional 23ft) Assistive device: Rolling walker (2 wheels)         General Gait Details: no LOB, but noted to have wide BOS. does catch his toe occasionally on RW, cued for RW positioning  Stairs            Wheelchair Mobility    Modified Rankin (Stroke Patients Only)       Balance Overall balance assessment: Needs assistance Sitting-balance support: Feet supported Sitting balance-Leahy Scale: Normal       Standing balance-Leahy Scale: Good  Pertinent Vitals/Pain Pain Assessment Pain Assessment: No/denies pain    Home Living Family/patient expects to be discharged to:: Private residence Living Arrangements: Children Available Help at Discharge: Family;Available PRN/intermittently (daughter lives with him, works  8-6pm most days) Type of Home: House Home Access: Stairs to enter Entrance Stairs-Rails: None Secretary/administrator of Steps: 1+1+threshold   Home Layout: One level Home Equipment: Agricultural consultant (2 wheels);Rollator (4 wheels);Cane - single point;Crutches;BSC/3in1;Grab bars - tub/shower;Wheelchair - manual      Prior Function Prior Level of Function : Independent/Modified Independent;History of Falls (last six months)                     Hand Dominance        Extremity/Trunk Assessment   Upper Extremity Assessment Upper Extremity Assessment: Generalized weakness    Lower Extremity Assessment Lower Extremity Assessment: Generalized weakness    Cervical / Trunk Assessment Cervical / Trunk Assessment: Normal  Communication   Communication: HOH  Cognition Arousal/Alertness: Awake/alert Behavior During Therapy: WFL for tasks assessed/performed Overall Cognitive Status: Within Functional Limits for tasks assessed                                          General Comments      Exercises     Assessment/Plan    PT Assessment Patient needs continued PT services  PT Problem List Decreased activity tolerance;Decreased mobility;Decreased strength;Decreased balance       PT Treatment Interventions DME instruction;Therapeutic exercise;Balance training;Gait training;Stair training;Neuromuscular re-education;Functional mobility training;Therapeutic activities;Patient/family education    PT Goals (Current goals can be found in the Care Plan section)  Acute Rehab PT Goals Patient Stated Goal: to be able to walk more PT Goal Formulation: With patient Time For Goal Achievement: 05/15/22 Potential to Achieve Goals: Good    Frequency Min 2X/week     Co-evaluation               AM-PAC PT "6 Clicks" Mobility  Outcome Measure Help needed turning from your back to your side while in a flat bed without using bedrails?: None Help needed moving  from lying on your back to sitting on the side of a flat bed without using bedrails?: None Help needed moving to and from a bed to a chair (including a wheelchair)?: None Help needed standing up from a chair using your arms (e.g., wheelchair or bedside chair)?: None Help needed to walk in hospital room?: A Little Help needed climbing 3-5 steps with a railing? : A Little 6 Click Score: 22    End of Session Equipment Utilized During Treatment: Gait belt Activity Tolerance: Patient tolerated treatment well Patient left: in chair;with chair alarm set;with call bell/phone within reach Nurse Communication: Mobility status PT Visit Diagnosis: Other abnormalities of gait and mobility (R26.89);Difficulty in walking, not elsewhere classified (R26.2)    Time: 1017-5102 PT Time Calculation (min) (ACUTE ONLY): 31 min   Charges:   PT Evaluation $PT Eval Low Complexity: 1 Low PT Treatments $Therapeutic Activity: 23-37 mins        Olga Coaster PT, DPT 11:07 AM,05/01/22

## 2022-05-01 NOTE — Progress Notes (Signed)
[facility] Cardiology  CARDIOLOGY CONSULT NOTE  Patient ID: Aaron Sosa MRN: [id number] DOB/AGE: 07/25/1941 81 y.o.  Admit date: 04/28/2022 Referring Physician Chipper Herb Primary Physician [facility] Primary Cardiologist Gwen Pounds Reason for Consultation congestive heart failure  HPI: 81 year old gentleman with a history of HFrEF (EF 25 to 30% with chronic hypotension), CAD (3V disease not amenable to PCI by cath 03/15/2020), CKD who was admitted to the hospital with heart failure exacerbation.  Interval history: - No acute events.  - Feeling slightly better today, though lasix held last night d/t low BP.  - Continues to have LE edema, orthopnea, SOB.   Review of systems complete and found to be negative unless listed above     Past Medical History:  Diagnosis Date   Arthritis    CHF (congestive heart failure) (HCC)    Diabetes mellitus without complication (HCC)    HOH (hard of hearing)    Lower extremity edema     Past Surgical History:  Procedure Laterality Date   CATARACT EXTRACTION Left    CATARACT EXTRACTION W/PHACO Right 05/06/2015   Procedure: CATARACT EXTRACTION PHACO AND INTRAOCULAR LENS PLACEMENT (IOC);  Surgeon: Lia Hopping, MD;  Location: [facility] ORS;  Service: Ophthalmology;  Laterality: Right;  [place] 1:14.7         AP   11.2          CDE   8.43    cassette lot #[id number]   RETINAL DETACHMENT SURGERY     RIGHT/LEFT HEART CATH AND CORONARY ANGIOGRAPHY N/A 03/17/2020   Procedure: RIGHT/LEFT HEART CATH AND CORONARY ANGIOGRAPHY;  Surgeon: Lamar Blinks, MD;  Location: [facility] INVASIVE CV LAB;  Service: Cardiovascular;  Laterality: N/A;   WRIST FRACTURE SURGERY  1958    Medications Prior to Admission  Medication Sig Dispense Refill Last Dose   acetaminophen (TYLENOL) 500 MG tablet Take 2 tablets (1,000 mg total) by mouth every 8 (eight) hours as needed for mild pain, moderate pain or headache. 30 tablet 0 Unknown at PRN   aspirin EC 81 MG EC tablet Take 1 tablet (81 mg total) by  mouth daily.   04/28/2022 at 0800   calcium carbonate (TUMS - DOSED IN MG ELEMENTAL CALCIUM) 500 MG chewable tablet Chew 1 tablet (200 mg of elemental calcium total) by mouth 3 (three) times daily as needed for indigestion or heartburn.   Unknown at PRN   clopidogrel (PLAVIX) 75 MG tablet Take 75 mg by mouth daily.   Past Week at Unknown   dapagliflozin propanediol (FARXIGA) 5 MG TABS tablet Take 5 mg by mouth daily.   Past Week at Unknown   docusate sodium (COLACE) 100 MG capsule Take 1 capsule (100 mg total) by mouth 2 (two) times daily as needed for mild constipation or moderate constipation. 10 capsule 0 Unknown at PRN   fluticasone-salmeterol (ADVAIR) 100-50 MCG/ACT AEPB Inhale 1 puff into the lungs 2 (two) times daily.   Unknown at Unknown   glipiZIDE (GLUCOTROL XL) 10 MG 24 hr tablet Take 10 mg by mouth daily.   Past Week at Unknown   magnesium oxide (MAG-OX) 400 MG tablet Take 400 mg by mouth 2 (two) times daily.      metFORMIN (GLUCOPHAGE-XR) 500 MG 24 hr tablet Take 1,000 mg by mouth daily with supper.   Unknown at Unknown   metolazone (ZAROXOLYN) 5 MG tablet Take 5 mg by mouth See admin instructions. Take 1 tablet (5 mg total) by mouth every Tuesday, Thursday, Saturday, and Sunday   Past Week  at Unknown   nitroGLYCERIN (NITROSTAT) 0.4 MG SL tablet Place 0.4 mg under the tongue every 5 (five) minutes as needed for chest pain.   Unknown at PRN   [facility] ULTRA test strip Use to check blood sugar up to 2 x daily 50 each 12    polyethylene glycol powder (GLYCOLAX/MIRALAX) 17 GM/SCOOP powder Take 17 g by mouth daily as needed. Take 1 packet (17 g total) by mouth once daily as needed for Constipation Mix in 4-8ounces of fluid prior to taking.   Unknown at PRN   potassium chloride (KLOR-CON M) 10 MEQ tablet Take 10 mEq by mouth daily as needed (supplementation).   Unknown at PRN   rosuvastatin (CRESTOR) 20 MG tablet Take 20 mg by mouth at bedtime.   Past Week at Unknown   senna-docusate (SENOKOT-S)  8.6-50 MG tablet Take 2 tablets by mouth 2 (two) times daily as needed for constipation.   Unknown at PRN   torsemide (DEMADEX) 100 MG tablet Take 100 mg by mouth daily.   Past Week at Unknown   gabapentin (NEURONTIN) 300 MG capsule Take 1 capsule (300 mg total) by mouth 3 (three) times daily. (Patient not taking: Reported on 04/28/2022)   Not Taking   Social History   Socioeconomic History   Marital status: Widowed    Spouse name: Not on file   Number of children: Not on file   Years of education: High School   Highest education level: High school graduate  Occupational History   Not on file  Tobacco Use   Smoking status: Former    Packs/day: 2.00    Years: 5.00    Total pack years: 10.00    Types: Cigarettes   Smokeless tobacco: Former  Building services engineer Use: Never used  Substance and Sexual Activity   Alcohol use: Yes    Alcohol/week: 1.0 standard drink of alcohol    Types: 1 Cans of beer per week   Drug use: Never   Sexual activity: Not on file  Other Topics Concern   Not on file  Social History Narrative   Not on file   Social Determinants of Health   Financial Resource Strain: Not on file  Food Insecurity: Not on file  Transportation Needs: Not on file  Physical Activity: Inactive (07/16/2019)   Exercise Vital Sign    Days of Exercise per Week: 0 days    Minutes of Exercise per Session: 0 min  Stress: Not on file  Social Connections: Not on file  Intimate Partner Violence: Not on file    Family History  Problem Relation Age of Onset   Heart attack Mother    Diabetes Mellitus II Mother    Heart attack Father       Review of systems complete and found to be negative unless listed above      PHYSICAL EXAM  General: Well developed, well nourished, in no acute distress HEENT:  Normocephalic and atramatic Neck:  No JVD.  Lungs: Clear bilaterally to auscultation and percussion. Heart: HRRR . Normal S1 and S2 without gallops or murmurs.  Abdomen: Bowel  sounds are positive, abdomen soft and non-tender  Msk:  Back normal, normal gait. Normal strength and tone for age. Extremities: No clubbing, cyanosis or edema.   Neuro: Alert and oriented X 3. Psych:  Good affect, responds appropriately  Labs:   Lab Results  Component Value Date   WBC 8.2 04/28/2022   HGB 13.2 04/28/2022   HCT 42.1  04/28/2022   MCV 90.1 04/28/2022   PLT 218 04/28/2022    Recent Labs  Lab 04/28/22 1203 04/29/22 0419 05/01/22 0556  NA 133*   < > 134*  K 3.0*   < > 3.9  CL 90*   < > 98  CO2 29   < > 31  BUN 34*   < > 38*  CREATININE 1.49*   < > 1.26*  CALCIUM 8.7*   < > 8.4*  PROT 6.7  --   --   BILITOT 1.7*  --   --   ALKPHOS 83  --   --   ALT 24  --   --   AST 34  --   --   GLUCOSE 253*   < > 145*   < > = values in this interval not displayed.    No results found for: "CKTOTAL", "CKMB", "CKMBINDEX", "TROPONINI"  Lab Results  Component Value Date   CHOL 153 04/29/2022   CHOL 171 03/16/2020   CHOL 234 (H) 12/02/2019   Lab Results  Component Value Date   HDL 44 04/29/2022   HDL 43 03/16/2020   HDL 57 12/02/2019   Lab Results  Component Value Date   LDLCALC 98 04/29/2022   LDLCALC 103 (H) 03/16/2020   LDLCALC 151 (H) 12/02/2019   Lab Results  Component Value Date   TRIG 57 04/29/2022   TRIG 123 03/16/2020   TRIG 140 12/02/2019   Lab Results  Component Value Date   CHOLHDL 3.5 04/29/2022   CHOLHDL 4.0 03/16/2020   CHOLHDL 4.1 12/02/2019   No results found for: "LDLDIRECT"    Radiology: DG Chest 2 View  Result Date: 04/28/2022 CLINICAL DATA:  Shortness of breath EXAM: CHEST - 2 VIEW COMPARISON:  Radiograph 11/05/2021 FINDINGS: Unchanged cardiomegaly. There are mild bilateral interstitial opacities. There is a small right pleural effusion and adjacent basilar atelectasis. No pneumothorax. No acute osseous abnormality. IMPRESSION: Mild pulmonary edema and small right pleural effusion with adjacent atelectasis. Unchanged cardiomegaly.  Electronically Signed   By: Maurine Simmering M.D.   On: 04/28/2022 12:50    EKG: Sinus rhythm 72 bpm  ASSESSMENT AND PLAN:   1.  Acute on chronic systolic congestive heart failure, HFrEF, LVEF 25 to 30% 11/06/2021, BNP 2500 on presentation. receiving furosemide 40 mg IV every 12 hours, with adequate diuresis, net output 1600 cc 04/28/2022, 840 cc 04/29/2022, 679cc 04/30/2022 2.  Coronary artery disease, severe three-vessel CAD by cardiac catheterization 03/15/2020, not amenable to percutaneous or surgical revascularization, currently chest pain-free, with unremarkable high-sensitivity troponin 33 and 33.  ECG reveals sinus rhythm without ischemic ST-T wave changes. 3.  Chronic kidney disease stage IIIa 4.  Low blood pressure, on midodrine 5.  Medication non-compliance, patient admits to not taking metolazone, and torsemide intermittently d/t inability to sleep at night d/t frequent urination.  Recommendations  1.  Agree with overall current therapy 2.  Continue diuresis- please administer this today; held for low BP yesterday.  3.  Carefully monitor renal status 4.  Agree with trial of low-dose metoprolol succinate  Signed: Andrez Grime MD 05/01/2022, 8:39 AM

## 2022-05-01 NOTE — TOC Initial Note (Signed)
Transition of Care [facility]) - Initial/Assessment Note    Patient Details  Name: Aaron Sosa MRN: [id number] Date of Birth: 05-10-1941  [facility]) CM/SW Contact:    Truddie Hidden, RN Phone Number: 05/01/2022, 12:44 PM  Clinical Narrative:                 Spoke with patient daughter Thomasene Ripple by phone. Patient admitted for acute on chronic combined systolic and diastolic congestive heart failure.  Admitted from:Home  PCP:Dr. Einar Crow   Ph[facility]acy: [facility] Current home health/prior home health/DME:WC, walker with seat, standard walker, canes  Patient daughter advised of PT recommendation. Referral sent and accepted y Becky Sax of [address]. Patient daughter advised. Patient will be transported home  by family upon discharge           Patient Goals and CMS Choice        Expected Discharge Plan and Services                                                Prior Living Arrangements/Services                       Activities of Daily Living Home Assistive Devices/Equipment: Oxygen, Walker (specify type), Eyeglasses ADL Screening (condition at time of admission) Patient's cognitive ability adequate to safely complete daily activities?: Yes Is the patient deaf or have difficulty hearing?: Yes Does the patient have difficulty seeing, even when wearing glasses/contacts?: Yes Does the patient have difficulty concentrating, remembering, or making decisions?: No Patient able to express need for assistance with ADLs?: No Does the patient have difficulty dressing or bathing?: No Independently performs ADLs?: Yes (appropriate for developmental age) Does the patient have difficulty walking or climbing stairs?: No Weakness of Legs: Both Weakness of Arms/Hands: None  Permission Sought/Granted                  Emotional Assessment              Admission diagnosis:  Hypokalemia [E87.6] Acute on chronic combined  systolic and diastolic congestive heart failure (HCC) [I50.43] Acute on chronic congestive heart failure, unspecified heart failure type (HCC) [I50.9] Patient Active Problem List   Diagnosis Date Noted   Hypotension 04/30/2022   Chronic hypoxemic respiratory failure (HCC) 04/29/2022   Acute on chronic combined systolic and diastolic congestive heart failure (HCC) 04/28/2022   Type II diabetes mellitus with renal manifestations (HCC) 04/28/2022   Chronic kidney disease, stage 3a (HCC) 04/28/2022   CAD (coronary artery disease) 04/28/2022   HLD (hyperlipidemia) 04/28/2022   Hypokalemia 04/28/2022   Constipation 04/28/2022   Hypoxia    Shortness of breath    Elevated troponin 11/06/2021   Closed fracture of ramus of right pubis (HCC) 11/05/2021   Chronic systolic CHF (congestive heart failure) (HCC) 11/05/2021   Leukocytosis 11/05/2021   Hyponatremia 11/05/2021   Type 2 diabetes mellitus with hyperglycemia (HCC) 11/05/2021   NSTEMI (non-ST elevated myocardial infarction) (HCC) 03/18/2020   Acute exacerbation of CHF (congestive heart failure) (HCC) 03/16/2020   Diabetic retinopathy (HCC) 05/23/2019   Myalgia due to statin 03/12/2019   Obesity (BMI 30.0-34.9) 12/30/2018   Essential hypertension 08/14/2018   Type 2 diabetes mellitus (HCC) 08/14/2018   Hyperlipidemia associated with type 2 diabetes mellitus (HCC) 08/14/2018   Lower extremity edema  08/14/2018   Hearing loss of both ears 08/14/2018   PCP:  Lauro Regulus, MD Pharmacy:   [facility] [address]), [place] - [address] [place]) [place] [address] Phone: [phone] Fax: [phone]     Social Determinants of Health (SDOH) Interventions    Readmission Risk Interventions     No data to display

## 2022-05-01 NOTE — Progress Notes (Signed)
Nutrition Brief Note  RD pulled to chart secondary to admission for CHF.   Wt Readings from Last 15 Encounters:  05/01/22 92.5 kg  11/14/21 99 kg  03/16/20 108 kg  12/04/19 103 kg  09/03/19 103 kg  12/30/18 104.3 kg  09/24/18 104.3 kg  08/14/18 104.1 kg  05/06/15 96.6 kg   Pt with medical history significant of sCHF with EF 25-30%, HTN. HLD, DM, HOH, CAD, CKD-3a, who presents with SOB.  Pt admitted with CHF.    Reviewed I/O's: -637 ml x 24 hours and -3.1 L since admission  Spoke with pt and daughter. Pt reports feeling much better since being admitted. Per pt, he has experienced a general decline in health over the past month secondary to constipation and early satiety. Per daughter, he has only been able to consume a few bites of soup at meals.   Since being admitted to the hospital, pt's appetite has improved greatly. He cosnumed 100% of his breakfast. ALso noted multiple snacks on his breakfast tray.  Pt endorses some weight loss, but suspects this may be related to diuresis. His UBW ranges from 210-217# ("depending on how much I eat"). Reviewed wt hx; pt has experienced a 6.6% wt loss over the past 3 months, which is not significant for time frame.   Nutrition-Focused physical exam completed. Findings are no fat depletion, no muscle depletion, and moderate edema.    Medications reviewed and include laisix, miralax, and senokot.   Lab Results  Component Value Date   HGBA1C 8.2 (H) 04/29/2022   PTA DM medications are 2000 mg metformin daily and 10 mg glipizide daily.   Labs reviewed: Na: 134, CBGS: 102-241 (inpatient orders for glycemic control are 0-5 units insulin aspart daily at bedtime and 0-9 units insulin aspart TID with meals).    Current diet order is 2 gram sodium, patient is consuming approximately 100% of meals at this time. Labs and medications reviewed.   No nutrition interventions warranted at this time. If nutrition issues arise, please consult RD.   Levada Schilling, RD, LDN, CDCES Registered Dietitian II Certified Diabetes Care and Education Specialist Please refer to [facility] for RD and/or RD on-call/weekend/after hours pager

## 2022-05-01 NOTE — Progress Notes (Signed)
       CROSS COVER NOTE  NAME: Aaron Sosa MRN: [id number] DOB : February 21, 1941    Date of Service   05/01/22  HPI/Events of Note   Secure chat received from nursing with concerns that patient became hypotensive today after AM dose of lasix. SBP 110 prior and SBP 89 after. Current BP 96/55. On review Patient has chronically low BP (recent outpatient Bps 96/68 90/60 88/58) and is on midodrine TID inpatient. Will give 20 of lasix now and reassess BP and fluid status in 6hrs.    Interventions   Plan: 20mg  IV Lasix now 20 mg IV Lasix 2AM BMP with AM labs          This document was prepared using Dragon voice recognition software and may include unintentional dictation errors.  DNP, MHA, FNP-BC Nurse Practitioner [facility] Pager [phone]

## 2022-05-02 DIAGNOSIS — J9611 Chronic respiratory failure with hypoxia: Secondary | ICD-10-CM | POA: Diagnosis not present

## 2022-05-02 DIAGNOSIS — I5043 Acute on chronic combined systolic (congestive) and diastolic (congestive) heart failure: Secondary | ICD-10-CM | POA: Diagnosis not present

## 2022-05-02 DIAGNOSIS — I9589 Other hypotension: Secondary | ICD-10-CM | POA: Diagnosis not present

## 2022-05-02 LAB — BASIC METABOLIC PANEL
Anion gap: 11 (ref 5–15)
BUN: 47 mg/dL — ABNORMAL HIGH (ref 8–23)
CO2: 26 mmol/L (ref 22–32)
Calcium: 8.7 mg/dL — ABNORMAL LOW (ref 8.9–10.3)
Chloride: 94 mmol/L — ABNORMAL LOW (ref 98–111)
Creatinine, Ser: 1.51 mg/dL — ABNORMAL HIGH (ref 0.61–1.24)
GFR, Estimated: 46 mL/min — ABNORMAL LOW (ref >60)
Glucose, Bld: 129 mg/dL — ABNORMAL HIGH (ref 70–99)
Potassium: 4.5 mmol/L (ref 3.5–5.1)
Sodium: 131 mmol/L — ABNORMAL LOW (ref 135–145)

## 2022-05-02 LAB — CBC
HCT: 40.1 % (ref 39.0–52.0)
Hemoglobin: 12.6 g/dL — ABNORMAL LOW (ref 13.0–17.0)
MCH: 28.3 pg (ref 26.0–34.0)
MCHC: 31.4 g/dL (ref 30.0–36.0)
MCV: 90.1 fL (ref 80.0–100.0)
Platelets: 234 10*3/uL (ref 150–400)
RBC: 4.45 MIL/uL (ref 4.22–5.81)
RDW: 14.9 % (ref 11.5–15.5)
WBC: 7.8 10*3/uL (ref 4.0–10.5)
nRBC: 0 % (ref 0.0–0.2)

## 2022-05-02 LAB — GLUCOSE, CAPILLARY
Glucose-Capillary: 135 mg/dL — ABNORMAL HIGH (ref 70–99)
Glucose-Capillary: 195 mg/dL — ABNORMAL HIGH (ref 70–99)
Glucose-Capillary: 197 mg/dL — ABNORMAL HIGH (ref 70–99)
Glucose-Capillary: 176 mg/dL — ABNORMAL HIGH (ref 70–99)

## 2022-05-02 LAB — MAGNESIUM: Magnesium: 2.5 mg/dL — ABNORMAL HIGH (ref 1.7–2.4)

## 2022-05-02 MED ORDER — FUROSEMIDE 10 MG/ML IJ SOLN
60.0000 mg | Freq: Once | INTRAMUSCULAR | Status: AC
Start: 2022-05-02 — End: 2022-05-02
  Administered 2022-05-02: 60 mg via INTRAVENOUS
  Filled 2022-05-02: qty 6

## 2022-05-02 MED ORDER — FUROSEMIDE 10 MG/ML IJ SOLN
8.0000 mg/h | INTRAVENOUS | Status: DC
  Administered 2022-05-02: 4 mg/h via INTRAVENOUS
  Administered 2022-05-04 – 2022-05-08 (×4): 8 mg/h via INTRAVENOUS
  Filled 2022-05-02 (×6): qty 20

## 2022-05-02 MED ORDER — LACTULOSE 10 GM/15ML PO SOLN
30.0000 g | Freq: Two times a day (BID) | ORAL | Status: AC
  Administered 2022-05-02 (×2): 30 g via ORAL
  Filled 2022-05-02 (×2): qty 60

## 2022-05-02 MED ORDER — FUROSEMIDE 10 MG/ML IJ SOLN: 60.0000 mg | Freq: Two times a day (BID) | INTRAMUSCULAR | Status: DC

## 2022-05-02 MED ORDER — ORAL CARE MOUTH RINSE: 15.0000 mL | OROMUCOSAL | Status: DC | PRN

## 2022-05-02 NOTE — Progress Notes (Signed)
[facility] Cardiology  CARDIOLOGY CONSULT NOTE  Patient ID: Aaron Sosa MRN: [id number] DOB/AGE: 1940-12-19 81 y.o.  Admit date: 04/28/2022 Referring Physician Chipper Herb Primary Physician [facility] Primary Cardiologist Gwen Pounds Reason for Consultation congestive heart failure  HPI: 81 year old gentleman with a history of HFrEF (EF 25 to 30% with chronic hypotension), CAD (3V disease not amenable to PCI by cath 03/15/2020), CKD who was admitted to the hospital with heart failure exacerbation.  Interval history: - No documented urine output yesterday. Received lasix in the morning, then a reduced dose in the afternoon.  - This morning sleeping and does not wake easily. His daughter reports he continues to have shortness of breath.   Review of systems complete and found to be negative unless listed above     Past Medical History:  Diagnosis Date   Arthritis    CHF (congestive heart failure) (HCC)    Diabetes mellitus without complication (HCC)    HOH (hard of hearing)    Lower extremity edema     Past Surgical History:  Procedure Laterality Date   CATARACT EXTRACTION Left    CATARACT EXTRACTION W/PHACO Right 05/06/2015   Procedure: CATARACT EXTRACTION PHACO AND INTRAOCULAR LENS PLACEMENT (IOC);  Surgeon: Lia Hopping, MD;  Location: [facility] ORS;  Service: Ophthalmology;  Laterality: Right;  [place] 1:14.7         AP   11.2          CDE   8.43    cassette lot #[id number]   RETINAL DETACHMENT SURGERY     RIGHT/LEFT HEART CATH AND CORONARY ANGIOGRAPHY N/A 03/17/2020   Procedure: RIGHT/LEFT HEART CATH AND CORONARY ANGIOGRAPHY;  Surgeon: Lamar Blinks, MD;  Location: [facility] INVASIVE CV LAB;  Service: Cardiovascular;  Laterality: N/A;   WRIST FRACTURE SURGERY  1958    Medications Prior to Admission  Medication Sig Dispense Refill Last Dose   acetaminophen (TYLENOL) 500 MG tablet Take 2 tablets (1,000 mg total) by mouth every 8 (eight) hours as needed for mild pain, moderate pain or headache. 30 tablet 0  Unknown at PRN   aspirin EC 81 MG EC tablet Take 1 tablet (81 mg total) by mouth daily.   04/28/2022 at 0800   calcium carbonate (TUMS - DOSED IN MG ELEMENTAL CALCIUM) 500 MG chewable tablet Chew 1 tablet (200 mg of elemental calcium total) by mouth 3 (three) times daily as needed for indigestion or heartburn.   Unknown at PRN   clopidogrel (PLAVIX) 75 MG tablet Take 75 mg by mouth daily.   Past Week at Unknown   dapagliflozin propanediol (FARXIGA) 5 MG TABS tablet Take 5 mg by mouth daily.   Past Week at Unknown   docusate sodium (COLACE) 100 MG capsule Take 1 capsule (100 mg total) by mouth 2 (two) times daily as needed for mild constipation or moderate constipation. 10 capsule 0 Unknown at PRN   fluticasone-salmeterol (ADVAIR) 100-50 MCG/ACT AEPB Inhale 1 puff into the lungs 2 (two) times daily.   Unknown at Unknown   glipiZIDE (GLUCOTROL XL) 10 MG 24 hr tablet Take 10 mg by mouth daily.   Past Week at Unknown   magnesium oxide (MAG-OX) 400 MG tablet Take 400 mg by mouth 2 (two) times daily.      metFORMIN (GLUCOPHAGE-XR) 500 MG 24 hr tablet Take 1,000 mg by mouth daily with supper.   Unknown at Unknown   metolazone (ZAROXOLYN) 5 MG tablet Take 5 mg by mouth See admin instructions. Take 1 tablet (5 mg total) by  mouth every Tuesday, Thursday, Saturday, and Sunday   Past Week at Unknown   nitroGLYCERIN (NITROSTAT) 0.4 MG SL tablet Place 0.4 mg under the tongue every 5 (five) minutes as needed for chest pain.   Unknown at PRN   [facility] ULTRA test strip Use to check blood sugar up to 2 x daily 50 each 12    polyethylene glycol powder (GLYCOLAX/MIRALAX) 17 GM/SCOOP powder Take 17 g by mouth daily as needed. Take 1 packet (17 g total) by mouth once daily as needed for Constipation Mix in 4-8ounces of fluid prior to taking.   Unknown at PRN   potassium chloride (KLOR-CON M) 10 MEQ tablet Take 10 mEq by mouth daily as needed (supplementation).   Unknown at PRN   rosuvastatin (CRESTOR) 20 MG tablet Take 20  mg by mouth at bedtime.   Past Week at Unknown   senna-docusate (SENOKOT-S) 8.6-50 MG tablet Take 2 tablets by mouth 2 (two) times daily as needed for constipation.   Unknown at PRN   torsemide (DEMADEX) 100 MG tablet Take 100 mg by mouth daily.   Past Week at Unknown   gabapentin (NEURONTIN) 300 MG capsule Take 1 capsule (300 mg total) by mouth 3 (three) times daily. (Patient not taking: Reported on 04/28/2022)   Not Taking   Social History   Socioeconomic History   Marital status: Widowed    Spouse name: Not on file   Number of children: Not on file   Years of education: High School   Highest education level: High school graduate  Occupational History   Not on file  Tobacco Use   Smoking status: Former    Packs/day: 2.00    Years: 5.00    Total pack years: 10.00    Types: Cigarettes   Smokeless tobacco: Former  Building services engineer Use: Never used  Substance and Sexual Activity   Alcohol use: Yes    Alcohol/week: 1.0 standard drink of alcohol    Types: 1 Cans of beer per week   Drug use: Never   Sexual activity: Not on file  Other Topics Concern   Not on file  Social History Narrative   Not on file   Social Determinants of Health   Financial Resource Strain: Not on file  Food Insecurity: Not on file  Transportation Needs: Not on file  Physical Activity: Inactive (07/16/2019)   Exercise Vital Sign    Days of Exercise per Week: 0 days    Minutes of Exercise per Session: 0 min  Stress: Not on file  Social Connections: Not on file  Intimate Partner Violence: Not on file    Family History  Problem Relation Age of Onset   Heart attack Mother    Diabetes Mellitus II Mother    Heart attack Father       Review of systems complete and found to be negative unless listed above      PHYSICAL EXAM  General: Well developed, well nourished, in no acute distress HEENT:  Normocephalic and atramatic Neck:  No JVD.  Lungs: Clear bilaterally to auscultation and  percussion. Heart: HRRR . Normal S1 and S2 without gallops or murmurs.  Abdomen: Bowel sounds are positive, abdomen soft and non-tender  Msk:  Back normal, normal gait. Normal strength and tone for age. Extremities: 2+ woody edema. Somewhat cool to touch.  Neuro: Alert and oriented X 3. Psych:  Good affect, responds appropriately  Labs:   Lab Results  Component Value Date  WBC 7.8 05/02/2022   HGB 12.6 (L) 05/02/2022   HCT 40.1 05/02/2022   MCV 90.1 05/02/2022   PLT 234 05/02/2022    Recent Labs  Lab 04/28/22 1203 04/29/22 0419 05/02/22 0446  NA 133*   < > 131*  K 3.0*   < > 4.5  CL 90*   < > 94*  CO2 29   < > 26  BUN 34*   < > 47*  CREATININE 1.49*   < > 1.51*  CALCIUM 8.7*   < > 8.7*  PROT 6.7  --   --   BILITOT 1.7*  --   --   ALKPHOS 83  --   --   ALT 24  --   --   AST 34  --   --   GLUCOSE 253*   < > 129*   < > = values in this interval not displayed.    No results found for: "CKTOTAL", "CKMB", "CKMBINDEX", "TROPONINI"  Lab Results  Component Value Date   CHOL 153 04/29/2022   CHOL 171 03/16/2020   CHOL 234 (H) 12/02/2019   Lab Results  Component Value Date   HDL 44 04/29/2022   HDL 43 03/16/2020   HDL 57 12/02/2019   Lab Results  Component Value Date   LDLCALC 98 04/29/2022   LDLCALC 103 (H) 03/16/2020   LDLCALC 151 (H) 12/02/2019   Lab Results  Component Value Date   TRIG 57 04/29/2022   TRIG 123 03/16/2020   TRIG 140 12/02/2019   Lab Results  Component Value Date   CHOLHDL 3.5 04/29/2022   CHOLHDL 4.0 03/16/2020   CHOLHDL 4.1 12/02/2019   No results found for: "LDLDIRECT"    Radiology: DG Chest 2 View  Result Date: 04/28/2022 CLINICAL DATA:  Shortness of breath EXAM: CHEST - 2 VIEW COMPARISON:  Radiograph 11/05/2021 FINDINGS: Unchanged cardiomegaly. There are mild bilateral interstitial opacities. There is a small right pleural effusion and adjacent basilar atelectasis. No pneumothorax. No acute osseous abnormality. IMPRESSION: Mild  pulmonary edema and small right pleural effusion with adjacent atelectasis. Unchanged cardiomegaly. Electronically Signed   By: Maurine Simmering M.D.   On: 04/28/2022 12:50    EKG: Sinus rhythm 72 bpm  ASSESSMENT AND PLAN:   1.  Acute on chronic systolic congestive heart failure, HFrEF, LVEF 25 to 30% 11/06/2021, BNP 2500 on presentation. net output 1600 cc 04/28/2022, 840 cc 04/29/2022, 637cc 04/30/2022. Has not been consistently receiving diuretic since that time d/t BP. 2.  Coronary artery disease, severe three-vessel CAD by cardiac catheterization 03/15/2020, not amenable to percutaneous or surgical revascularization, currently chest pain-free, with unremarkable high-sensitivity troponin 33 and 33.  ECG reveals sinus rhythm without ischemic ST-T wave changes. 3.  Chronic kidney disease stage IIIa 4.  Low blood pressure, on midodrine 5.  Medication non-compliance, patient admits to not taking metolazone, and torsemide intermittently d/t inability to sleep at night d/t frequent urination.  Recommendations  1.  Agree with overall current therapy 2.  Continue diuresis- has not received consistent dosing x 2 days. Will give bolus 60 mg IV, then start infusion at 4 mg/hr 3.  Carefully monitor renal status 4.  Agree with trial of low-dose metoprolol succinate 5. I am worried about his  Signed: Andrez Grime MD 05/02/2022, 10:18 AM

## 2022-05-02 NOTE — Progress Notes (Signed)
  Progress Note   Patient: Aaron Sosa ERX:[id number] DOB: [birth year] DOA: 04/28/2022     4 DOS: the patient was seen and examined on 05/02/2022   Brief hospital course: Aaron Sosa is a 81 y.o. 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   Echocardiogram performed on 11/06/2021 showed ejection fraction of 25 to 30%.  With grade 2 diastolic dysfunction. Chest x-ray showed volume overload, BNP 2501, troponin 33. 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 7/2.  Blood pressure is low, gave albumin, discontinue Entresto, add midodrine.  Cardiology consulted  Assessment and Plan: Acute on chronic combined systolic and diastolic congestive heart failure. Hypotension. Chronic hypoxemic respiratory failure. Elevated troponin secondary to congestive heart failure exacerbation. Patient diuresing is slowing down, renal function slightly worse today.  Discussed with cardiology, will start furosemide drip. Still has considerable amount of volume overload. Entresto and beta-blocker is on hold due to hypotension, currently on midodrine.    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 Hypokalemia. Renal function getting worse today, will continue to follow.   Essential hypertension. Beta-blocker on hold today due to low blood pressure.   Chronic constipation. Patient has more abdominal distention due to worsening constipation.  Bladder scan did not show significant urinary retention.  At this time, I will give the patient water enema, will also give lactulose twice a day.        Subjective:  Patient complaining of significant abdominal distention and bloating.  Not able to have a bowel movement after giving lactulose. Still short of breath with  version, on 2 L oxygen.  Physical Exam: Vitals:   05/01/22 1936 05/01/22 2350 05/02/22 0402 05/02/22 0734  BP: 95/66 95/62 92/63  96/77  Pulse: 66 82 75 83  Resp: 17 18 18 17   Temp: 98 F (36.7 C) 98.3 F (36.8 C) 97.9 F (36.6 C) (!) 97.5 F (36.4 C)  TempSrc: Oral Oral Oral   SpO2: 100% 93% 98% 100%  Weight:   99.9 kg   Height:       General exam: Appears calm and comfortable  Respiratory system: Crackles in the base. Respiratory effort normal. Cardiovascular system: S1 & S2 heard, RRR. No JVD, murmurs, rubs, gallops or clicks.  Gastrointestinal system: Abdomen is nondistended, soft and nontender. No organomegaly or masses felt. Normal bowel sounds heard. Central nervous system: Alert and oriented x2. No focal neurological deficits. Extremities: Bilateral 2+ leg edema Skin: No rashes, lesions or ulcers Psychiatry: Judgement and insight appear normal. Mood & affect appropriate.   Data Reviewed:  Lab results reviewed.  Family Communication: Daughter updated at bedside.  Disposition: Status is: Inpatient Remains inpatient appropriate because: Severity of disease, IV treatment  Planned Discharge Destination: Home with Home Health    Time spent: 50 minutes  Author: 09-24-1999, MD 05/02/2022 10:44 AM  For on call review [email or web address].

## 2022-05-03 DIAGNOSIS — I5043 Acute on chronic combined systolic (congestive) and diastolic (congestive) heart failure: Secondary | ICD-10-CM | POA: Diagnosis not present

## 2022-05-03 LAB — CBC
HCT: 37.5 % — ABNORMAL LOW (ref 39.0–52.0)
Hemoglobin: 11.8 g/dL — ABNORMAL LOW (ref 13.0–17.0)
MCH: 27.8 pg (ref 26.0–34.0)
MCHC: 31.5 g/dL (ref 30.0–36.0)
MCV: 88.2 fL (ref 80.0–100.0)
Platelets: 188 10*3/uL (ref 150–400)
RBC: 4.25 MIL/uL (ref 4.22–5.81)
RDW: 15 % (ref 11.5–15.5)
WBC: 6 10*3/uL (ref 4.0–10.5)
nRBC: 0 % (ref 0.0–0.2)

## 2022-05-03 LAB — BASIC METABOLIC PANEL
Anion gap: 8 (ref 5–15)
BUN: 53 mg/dL — ABNORMAL HIGH (ref 8–23)
CO2: 28 mmol/L (ref 22–32)
Calcium: 8.4 mg/dL — ABNORMAL LOW (ref 8.9–10.3)
Chloride: 96 mmol/L — ABNORMAL LOW (ref 98–111)
Creatinine, Ser: 1.22 mg/dL (ref 0.61–1.24)
GFR, Estimated: 60 mL/min — ABNORMAL LOW (ref >60)
Glucose, Bld: 170 mg/dL — ABNORMAL HIGH (ref 70–99)
Potassium: 3.7 mmol/L (ref 3.5–5.1)
Sodium: 132 mmol/L — ABNORMAL LOW (ref 135–145)

## 2022-05-03 LAB — MAGNESIUM: Magnesium: 2.7 mg/dL — ABNORMAL HIGH (ref 1.7–2.4)

## 2022-05-03 LAB — GLUCOSE, CAPILLARY
Glucose-Capillary: 155 mg/dL — ABNORMAL HIGH (ref 70–99)
Glucose-Capillary: 255 mg/dL — ABNORMAL HIGH (ref 70–99)
Glucose-Capillary: 320 mg/dL — ABNORMAL HIGH (ref 70–99)
Glucose-Capillary: 224 mg/dL — ABNORMAL HIGH (ref 70–99)

## 2022-05-03 MED ORDER — POTASSIUM CHLORIDE CRYS ER 20 MEQ PO TBCR
40.0000 meq | EXTENDED_RELEASE_TABLET | Freq: Two times a day (BID) | ORAL | Status: AC
  Administered 2022-05-03 (×2): 40 meq via ORAL
  Filled 2022-05-03 (×3): qty 2

## 2022-05-03 MED ORDER — ENOXAPARIN SODIUM 60 MG/0.6ML IJ SOSY
0.5000 mg/kg | PREFILLED_SYRINGE | INTRAMUSCULAR | Status: DC
  Administered 2022-05-03 – 2022-05-08 (×6): 50 mg via SUBCUTANEOUS
  Filled 2022-05-03 (×6): qty 0.6

## 2022-05-03 NOTE — Progress Notes (Signed)
[facility] Cardiology  CARDIOLOGY CONSULT NOTE  Patient ID: Aaron Sosa MRN: [id number] DOB/AGE: [birth year] 81 y.o.  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  Interval history: -Alert and sitting up in a chair, ran out of batteries and his hearing aids and is very hard of hearing -Still feels somewhat short of breath and very constipated, although thankfully made some urine overnight  Review of systems complete and found to be negative unless listed above     Past Medical History:  Diagnosis Date   Arthritis    CHF (congestive heart failure) (HCC)    Diabetes mellitus without complication (HCC)    HOH (hard of hearing)    Lower extremity edema     Past Surgical History:  Procedure Laterality Date   CATARACT EXTRACTION Left    CATARACT EXTRACTION W/PHACO Right 05/06/2015   Procedure: CATARACT EXTRACTION PHACO AND INTRAOCULAR LENS PLACEMENT (IOC);  Surgeon: Lia Hopping, MD;  Location: [facility] ORS;  Service: Ophthalmology;  Laterality: Right;  [place] 1:14.7         AP   11.2          CDE   8.43    cassette lot #[id number]   RETINAL DETACHMENT SURGERY     RIGHT/LEFT HEART CATH AND CORONARY ANGIOGRAPHY N/A 03/17/2020   Procedure: RIGHT/LEFT HEART CATH AND CORONARY ANGIOGRAPHY;  Surgeon: Lamar Blinks, MD;  Location: [facility] INVASIVE CV LAB;  Service: Cardiovascular;  Laterality: N/A;   WRIST FRACTURE SURGERY  1958    Medications Prior to Admission  Medication Sig Dispense Refill Last Dose   acetaminophen (TYLENOL) 500 MG tablet Take 2 tablets (1,000 mg total) by mouth every 8 (eight) hours as needed for mild pain, moderate pain or headache. 30 tablet 0 Unknown at  PRN   aspirin EC 81 MG EC tablet Take 1 tablet (81 mg total) by mouth daily.   04/28/2022 at 0800   calcium carbonate (TUMS - DOSED IN MG ELEMENTAL CALCIUM) 500 MG chewable tablet Chew 1 tablet (200 mg of elemental calcium total) by mouth 3 (three) times daily as needed for indigestion or heartburn.   Unknown at PRN   clopidogrel (PLAVIX) 75 MG tablet Take 75 mg by mouth daily.   Past Week at Unknown   dapagliflozin propanediol (FARXIGA) 5 MG TABS tablet Take 5 mg by mouth daily.   Past Week at Unknown   docusate sodium (COLACE) 100 MG capsule Take 1 capsule (100 mg total) by mouth 2 (two) times daily as needed for mild constipation or moderate constipation. 10 capsule 0 Unknown at PRN   fluticasone-salmeterol (ADVAIR) 100-50 MCG/ACT AEPB Inhale 1 puff into the lungs 2 (two) times daily.   Unknown at Unknown   glipiZIDE (GLUCOTROL XL) 10 MG 24 hr tablet Take 10 mg by mouth daily.   Past Week at Unknown   magnesium oxide (MAG-OX) 400 MG tablet Take 400 mg by mouth 2 (two) times daily.      metFORMIN (GLUCOPHAGE-XR) 500 MG 24 hr tablet Take 1,000 mg by mouth daily with supper.   Unknown at Unknown   metolazone (ZAROXOLYN) 5 MG tablet Take 5 mg by mouth See admin instructions. Take 1 tablet (5 mg total) by mouth every Tuesday,  Thursday, Saturday, and Sunday   Past Week at Unknown   nitroGLYCERIN (NITROSTAT) 0.4 MG SL tablet Place 0.4 mg under the tongue every 5 (five) minutes as needed for chest pain.   Unknown at PRN   [facility] ULTRA test strip Use to check blood sugar up to 2 x daily 50 each 12    polyethylene glycol powder (GLYCOLAX/MIRALAX) 17 GM/SCOOP powder Take 17 g by mouth daily as needed. Take 1 packet (17 g total) by mouth once daily as needed for Constipation Mix in 4-8ounces of fluid prior to taking.   Unknown at PRN   potassium chloride (KLOR-CON M) 10 MEQ tablet Take 10 mEq by mouth daily as needed (supplementation).   Unknown at PRN   rosuvastatin (CRESTOR) 20 MG tablet Take 20 mg by mouth  at bedtime.   Past Week at Unknown   senna-docusate (SENOKOT-S) 8.6-50 MG tablet Take 2 tablets by mouth 2 (two) times daily as needed for constipation.   Unknown at PRN   torsemide (DEMADEX) 100 MG tablet Take 100 mg by mouth daily.   Past Week at Unknown   gabapentin (NEURONTIN) 300 MG capsule Take 1 capsule (300 mg total) by mouth 3 (three) times daily. (Patient not taking: Reported on 04/28/2022)   Not Taking   Social History   Socioeconomic History   Marital status: Widowed    Spouse name: Not on file   Number of children: Not on file   Years of education: High School   Highest education level: High school graduate  Occupational History   Not on file  Tobacco Use   Smoking status: Former    Packs/day: 2.00    Years: 5.00    Total pack years: 10.00    Types: Cigarettes   Smokeless tobacco: Former  Building services engineer Use: Never used  Substance and Sexual Activity   Alcohol use: Yes    Alcohol/week: 1.0 standard drink of alcohol    Types: 1 Cans of beer per week   Drug use: Never   Sexual activity: Not on file  Other Topics Concern   Not on file  Social History Narrative   Not on file   Social Determinants of Health   Financial Resource Strain: Not on file  Food Insecurity: Not on file  Transportation Needs: Not on file  Physical Activity: Inactive (07/16/2019)   Exercise Vital Sign    Days of Exercise per Week: 0 days    Minutes of Exercise per Session: 0 min  Stress: Not on file  Social Connections: Not on file  Intimate Partner Violence: Not on file    Family History  Problem Relation Age of Onset   Heart attack Mother    Diabetes Mellitus II Mother    Heart attack Father        PHYSICAL EXAM  General: Elderly Caucasian male sitting upright in recliner with legs elevated, daughter at bedside. HEENT:  Normocephalic and atraumatic.  Very hard of hearing Neck:  No JVD.  Lungs: Poor air movement bilaterally without appreciable crackles. Heart: HRRR .  Normal S1 and S2 without gallops or murmurs.  Abdomen: Bowel sounds are positive, abdomen soft and non-tender  Msk:   Normal strength and tone for age. Extremities: 2+ woody edema, chronic venous stasis changes.  Warm to the touch bilaterally Neuro: Alert and oriented X 3. Psych:  Good affect, responds appropriately  Labs:   Lab Results  Component Value Date   WBC 6.0 05/03/2022   HGB  11.8 (L) 05/03/2022   HCT 37.5 (L) 05/03/2022   MCV 88.2 05/03/2022   PLT 188 05/03/2022    Recent Labs  Lab 04/28/22 1203 04/29/22 0419 05/03/22 0627  NA 133*   < > 132*  K 3.0*   < > 3.7  CL 90*   < > 96*  CO2 29   < > 28  BUN 34*   < > 53*  CREATININE 1.49*   < > 1.22  CALCIUM 8.7*   < > 8.4*  PROT 6.7  --   --   BILITOT 1.7*  --   --   ALKPHOS 83  --   --   ALT 24  --   --   AST 34  --   --   GLUCOSE 253*   < > 170*   < > = values in this interval not displayed.    No results found for: "CKTOTAL", "CKMB", "CKMBINDEX", "TROPONINI"  Lab Results  Component Value Date   CHOL 153 04/29/2022   CHOL 171 03/16/2020   CHOL 234 (H) 12/02/2019   Lab Results  Component Value Date   HDL 44 04/29/2022   HDL 43 03/16/2020   HDL 57 12/02/2019   Lab Results  Component Value Date   LDLCALC 98 04/29/2022   LDLCALC 103 (H) 03/16/2020   LDLCALC 151 (H) 12/02/2019   Lab Results  Component Value Date   TRIG 57 04/29/2022   TRIG 123 03/16/2020   TRIG 140 12/02/2019   Lab Results  Component Value Date   CHOLHDL 3.5 04/29/2022   CHOLHDL 4.0 03/16/2020   CHOLHDL 4.1 12/02/2019   No results found for: "LDLDIRECT"    Radiology: DG Chest 2 View  Result Date: 04/28/2022 CLINICAL DATA:  Shortness of breath EXAM: CHEST - 2 VIEW COMPARISON:  Radiograph 11/05/2021 FINDINGS: Unchanged cardiomegaly. There are mild bilateral interstitial opacities. There is a small right pleural effusion and adjacent basilar atelectasis. No pneumothorax. No acute osseous abnormality. IMPRESSION: Mild pulmonary  edema and small right pleural effusion with adjacent atelectasis. Unchanged cardiomegaly. Electronically Signed   By: Maurine Simmering M.D.   On: 04/28/2022 12:50    EKG: Sinus rhythm 72 bpm  ASSESSMENT AND PLAN:   1.  Acute on chronic systolic congestive heart failure, HFrEF, LVEF 25 to 30% 11/06/2021, BNP 2500 on presentation. net output 1600 cc 04/28/2022, 840 cc 04/29/2022, 637cc 04/30/2022. Has not been consistently receiving diuretic since that time d/t BP. 2.  Coronary artery disease, severe three-vessel CAD by cardiac catheterization 03/15/2020, not amenable to percutaneous or surgical revascularization, currently chest pain-free, with unremarkable high-sensitivity troponin 33 and 33.  ECG reveals sinus rhythm without ischemic ST-T wave changes. 3.  Chronic kidney disease stage IIIa, improving with diuresis with creatinine 1.22, GFR 60 today 4.  Low blood pressure, on midodrine 5.  Medication non-compliance, patient admits to not taking metolazone, and torsemide intermittently d/t inability to sleep at night d/t frequent urination.  Recommendations  1.  Agree with overall current therapy 2.  Increase infusion from 4 mg/hr to 8 mg/hr 3.  Carefully monitor renal status 4.  Agree with trial of low-dose metoprolol succinate  This patient's plan of care was discussed and created with Dr. Corky Sox and he is in agreement.    Signed: Tristan Schroeder, PA-C 05/03/2022, 11:30 AM

## 2022-05-03 NOTE — Progress Notes (Signed)
PROGRESS NOTE    Aaron Sosa  [id number] DOB: Apr 23, 1941  DOA: 04/28/2022 Date of Service: 05/03/22 PCP: Kirk Ruths, MD     Brief Narrative / Hospital Course:  Aaron Sosa is a 81 y.o. 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  Echocardiogram performed on 11/06/2021 showed ejection fraction of 25 to 30%.  With grade 2 diastolic dysfunction. Severe three-vessel CAD by cardiac catheterization 03/15/2020, not amenable to percutaneous or surgical revascularization, currently chest pain-free 06/30: Chest x-ray showed volume overload, BNP 2501, troponin 33. Admitted for acute on chronic combined systolic diastolic congestive heart failure, started on IV Lasix. 07/02.  Blood pressure is low, gave albumin, discontinue Entresto, add midodrine.  Cardiology consulted 07/04: renal fxn worsening, started lasix gtt 07/05: Cr improving. I&O as below - possible entry error, showing 5000+ mL IN which would be a problem. Will confirm w/ staff.   Consultants:  Cardiology   Procedures: None     Subjective: Patient reports feeling tired, no chest pain, still having some SOB. C/o constipation.      ASSESSMENT & PLAN:   Principal Problem:   Acute on chronic combined systolic and diastolic congestive heart failure (HCC) Active Problems:   CAD (coronary artery disease)   Elevated troponin   Essential hypertension   Type II diabetes mellitus with renal manifestations (HCC)   Chronic kidney disease, stage 3a (HCC)   HLD (hyperlipidemia)   Hypokalemia   Type 2 diabetes mellitus (HCC)   Constipation   Chronic hypoxemic respiratory failure (HCC)   Hypotension   Acute on chronic combined systolic and diastolic congestive heart failure. Hypotension. Chronic hypoxemic respiratory failure. Elevated troponin secondary to congestive  heart failure exacerbation. Patient diuresing is slowing down, renal function slightly worse 07/04 but improved 07/05.   Per cardiology, 07/04 started furosemide drip. Entresto and beta-blocker is on hold due to hypotension, currently on midodrine. Net IO Since Admission: -3,332.74 mL [05/03/22 0833], concern for entry error on I&O but on exam he is demonstrating some LE edema, no rales.    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 Hypokalemia. Renal function getting worse yesterday, started on lasix gtt, better today, will continue to follow.   Essential hypertension. Beta-blocker has been on hold d/t low BP, restarted yesterday per cardiology .   Chronic constipation. Patient has more abdominal distention due to worsening constipation.  Bladder scan did not show significant urinary retention.  Water enema ordered x1 yesterday, scheduled lactulose twice a day.     DVT prophylaxis: Lovenox  Code Status: FULL  Family Communication: pt asked if he would like me to call anyone today, he said no he is in communication w/ his daughter.  Disposition Plan / TOC needs: anticipate back to previous home  environment, may need PT/OT eval for HH/SNF  Barriers to discharge / significant pending items: cardiology following, still requiring diuresis, still SOB. May need to address code status w/ patient and family given EF 20-25 and severe CAD, but stable for now.              Objective: Vitals:   05/02/22 2325 05/03/22 0317 05/03/22 0338 05/03/22 0825  BP: (!) 85/64 103/75  101/70  Pulse: 71 75  71  Resp: 20   17  Temp: 97.9 F (36.6 C) 98 F (  36.7 C)  98.1 F (36.7 C)  TempSrc: Oral Oral    SpO2: 97% 100%  99%  Weight:   101.3 kg   Height:        Intake/Output Summary (Last 24 hours) at 05/03/2022 0998 Last data filed at 05/03/2022 0744 Gross per 24 hour  Intake 495.59 ml  Output 1351 ml  Net -855.41 ml   Filed Weights    05/01/22 0311 05/02/22 0402 05/03/22 0338  Weight: 92.5 kg 99.9 kg 101.3 kg    Examination:  Constitutional:  VS as above General Appearance: alert, well-developed, well-nourished, NAD Eyes: Normal lids and conjunctive, non-icteric sclera PERRLA Ears, Nose, Mouth, Throat: Normal appearance Normal external auditory canal and TM bilaterally MMM, posterior pharynx without erythema/exudate Neck: No masses, trachea midline No thyroid enlargement/tenderness/mass appreciated Respiratory: Normal respiratory effort No dullness/hyper-resonance to percussion Breath sounds normal, no wheeze/rhonchi/rales Cardiovascular: S1/S2 normal, no murmur/rub/gallop auscultated No carotid bruit or JVD Pedal pulse II/IV bilaterally DP and PT No lower extremity edema Gastrointestinal: Nontender, no masses No hepatomegaly, no splenomegaly No hernia appreciated Musculoskeletal:  Gait normal No clubbing/cyanosis of digits Neurological: No cranial nerve deficit on limited exam Motor and sensation intact and symmetric Psychiatric: Normal judgment/insight Normal mood and affect       Scheduled Medications:   aspirin EC  81 mg Oral Daily   clopidogrel  75 mg Oral Daily   enoxaparin (LOVENOX) injection  40 mg Subcutaneous Q24H   gabapentin  300 mg Oral TID   insulin aspart  0-5 Units Subcutaneous QHS   insulin aspart  0-9 Units Subcutaneous TID WC   midodrine  5 mg Oral TID WC   multivitamin-lutein  1 capsule Oral BID   polyethylene glycol  17 g Oral Daily   rosuvastatin  20 mg Oral QHS   senna-docusate  2 tablet Oral BID    Continuous Infusions:  furosemide (LASIX) 200 mg in dextrose 5 % 100 mL (2 mg/mL) infusion 4 mg/hr (05/03/22 0744)    PRN Medications:  acetaminophen, albuterol, calcium carbonate, dextromethorphan-guaiFENesin, hydrALAZINE, nitroGLYCERIN, ondansetron (ZOFRAN) IV, mouth rinse, simethicone  Antimicrobials:  Anti-infectives (From admission, onward)    None        Data Reviewed: I have personally reviewed following labs and imaging studies  CBC: Recent Labs  Lab 04/28/22 1203 05/02/22 0446 05/03/22 0627  WBC 8.2 7.8 6.0  HGB 13.2 12.6* 11.8*  HCT 42.1 40.1 37.5*  MCV 90.1 90.1 88.2  PLT 218 234 188   Basic Metabolic Panel: Recent Labs  Lab 04/29/22 0419 04/29/22 1624 04/30/22 0425 05/01/22 0556 05/02/22 0446 05/03/22 0627  NA 136  --  134* 134* 131* 132*  K 3.0* 4.3 3.8 3.9 4.5 3.7  CL 96*  --  98 98 94* 96*  CO2 31  --  30 31 26 28   GLUCOSE 171*  --  110* 145* 129* 170*  BUN 34*  --  35* 38* 47* 53*  CREATININE 1.33*  --  1.22 1.26* 1.51* 1.22  CALCIUM 8.4*  --  8.1* 8.4* 8.7* 8.4*  MG 2.6*  --  2.5* 2.5* 2.5* 2.7*   GFR: Estimated Creatinine Clearance: 55.7 mL/min (by C-G formula based on SCr of 1.22 mg/dL). Liver Function Tests: Recent Labs  Lab 04/28/22 1203  AST 34  ALT 24  ALKPHOS 83  BILITOT 1.7*  PROT 6.7  ALBUMIN 3.8   No results for input(s): "LIPASE", "AMYLASE" in the last 168 hours. No results for input(s): "AMMONIA" in the last 168 hours.  Coagulation Profile: No results for input(s): "INR", "PROTIME" in the last 168 hours. Cardiac Enzymes: No results for input(s): "CKTOTAL", "CKMB", "CKMBINDEX", "TROPONINI" in the last 168 hours. BNP (last 3 results) No results for input(s): "PROBNP" in the last 8760 hours. HbA1C: No results for input(s): "HGBA1C" in the last 72 hours. CBG: Recent Labs  Lab 05/01/22 2101 05/02/22 0733 05/02/22 1132 05/02/22 1618 05/02/22 2045  GLUCAP 178* 135* 195* 197* 176*   Lipid Profile: No results for input(s): "CHOL", "HDL", "LDLCALC", "TRIG", "CHOLHDL", "LDLDIRECT" in the last 72 hours. Thyroid Function Tests: No results for input(s): "TSH", "T4TOTAL", "FREET4", "T3FREE", "THYROIDAB" in the last 72 hours. Anemia Panel: No results for input(s): "VITAMINB12", "FOLATE", "FERRITIN", "TIBC", "IRON", "RETICCTPCT" in the last 72 hours. Urine analysis:    Component  Value Date/Time   COLORURINE YELLOW 03/29/2022 2228   APPEARANCEUR CLEAR 03/29/2022 2228   LABSPEC 1.015 03/29/2022 2228   PHURINE 6.0 03/29/2022 2228   GLUCOSEU >=500 (A) 03/29/2022 2228   HGBUR NEGATIVE 03/29/2022 2228   BILIRUBINUR NEGATIVE 03/29/2022 2228   KETONESUR 5 (A) 03/29/2022 2228   PROTEINUR NEGATIVE 03/29/2022 2228   NITRITE NEGATIVE 03/29/2022 2228   LEUKOCYTESUR NEGATIVE 03/29/2022 2228   Sepsis Labs: @LABRCNTIP (procalcitonin:4,lacticidven:4)  No results found for this or any previous visit (from the past 240 hour(s)).       Radiology Studies last 96 hours: No results found.          LOS: 5 days       , DO [facility] 05/03/2022, 8:32 AM   Staff may message me via secure chat in Epic  but this may not receive immediate response,  please page for urgent matters!  If 7PM-7AM, please contact night-coverage [email or web address]  Dictation software was used to generate the above note. Typos may occur and escape review, as with typed/written notes. Please contact Dr 07/04/2022 directly for clarity if needed.

## 2022-05-03 NOTE — Plan of Care (Signed)
  Problem: Metabolic: Goal: Ability to maintain appropriate glucose levels will improve Outcome: Progressing   Problem: Nutritional: Goal: Maintenance of adequate nutrition will improve Outcome: Progressing   Problem: Nutrition: Goal: Adequate nutrition will be maintained Outcome: Progressing   Problem: Coping: Goal: Level of anxiety will decrease Outcome: Progressing   Problem: Safety: Goal: Ability to remain free from injury will improve Outcome: Progressing

## 2022-05-03 NOTE — Progress Notes (Signed)
Physical Therapy Treatment Patient Details Name: Aaron Sosa MRN: [id number] DOB: 11/05/1940 Today's Date: 05/03/2022   History of Present Illness Patient is an 81 year old male who presents with shortness of breath for approximately 4 weeks which has worsened in past few days. Patient has orthopnea and constipation. upon admission to ED patient with CHF exacerbation. PMH includes arthritis, CHF, DM, HOH, LE edema, obesity, leukocytosis, myalgia, SOB.    PT Comments    Patient alert, agreeable to PT up in recliner at start and end of session. Pt confirmed that he does not wear oxygen at home, only PRN. Pt placed on room air, and spO2>95% throughout session with mobility as well, RN notified of pt status. He was able to transfer with supervision and RW, educated on importance of proper turning and taking his time when returning to sitting (sat too early, left RW outside BOS). He ambulated ~143ft with RW and CGA, occasional cues for upright positioning and RW placement with fair carryover. Returned to room with all needs in reach. The patient would benefit from further skilled PT intervention to continue to progress towards goals. Recommendation remains appropriate.     Recommendations for follow up therapy are one component of a multi-disciplinary discharge planning process, led by the attending physician.  Recommendations may be updated based on patient status, additional functional criteria and insurance authorization.  Follow Up Recommendations  Home health PT     Assistance Recommended at Discharge Intermittent Supervision/Assistance  Patient can return home with the following Assistance with cooking/housework;Assist for transportation;Help with stairs or ramp for entrance   Equipment Recommendations  None recommended by PT    Recommendations for Other Services       Precautions / Restrictions Precautions Precautions: Fall Restrictions Weight Bearing Restrictions: No      Mobility  Bed Mobility               General bed mobility comments: seated in recliner chair at beginning/end of session    Transfers Overall transfer level: Needs assistance Equipment used: Rolling walker (2 wheels) Transfers: Sit to/from Stand Sit to Stand: Supervision                Ambulation/Gait   Gait Distance (Feet): 130 Feet Assistive device: Rolling walker (2 wheels)         General Gait Details: no LOB, but noted to have wide BOS. does catch his toe occasionally on RW, cued for RW positioning   Stairs             Wheelchair Mobility    Modified Rankin (Stroke Patients Only)       Balance Overall balance assessment: Needs assistance Sitting-balance support: Feet supported Sitting balance-Leahy Scale: Normal     Standing balance support: Reliant on assistive device for balance Standing balance-Leahy Scale: Good                              Cognition Arousal/Alertness: Awake/alert Behavior During Therapy: WFL for tasks assessed/performed Overall Cognitive Status: Within Functional Limits for tasks assessed                                          Exercises      General Comments        Pertinent Vitals/Pain Pain Assessment Pain Assessment: No/denies pain    Home  Living                          Prior Function            PT Goals (current goals can now be found in the care plan section) Progress towards PT goals: Progressing toward goals    Frequency    Min 2X/week      PT Plan Current plan remains appropriate    Co-evaluation              AM-PAC PT "6 Clicks" Mobility   Outcome Measure  Help needed turning from your back to your side while in a flat bed without using bedrails?: None Help needed moving from lying on your back to sitting on the side of a flat bed without using bedrails?: None Help needed moving to and from a bed to a chair (including a  wheelchair)?: None Help needed standing up from a chair using your arms (e.g., wheelchair or bedside chair)?: None Help needed to walk in hospital room?: A Little Help needed climbing 3-5 steps with a railing? : A Little 6 Click Score: 22    End of Session Equipment Utilized During Treatment: Gait belt Activity Tolerance: Patient tolerated treatment well Patient left: in chair;with chair alarm set;with call bell/phone within reach Nurse Communication: Mobility status PT Visit Diagnosis: Other abnormalities of gait and mobility (R26.89);Difficulty in walking, not elsewhere classified (R26.2)     Time: 6283-6629 PT Time Calculation (min) (ACUTE ONLY): 18 min  Charges:  $Therapeutic Activity: 8-22 mins                     Olga Coaster PT, DPT 11:02 AM,05/03/22

## 2022-05-04 DIAGNOSIS — I5043 Acute on chronic combined systolic (congestive) and diastolic (congestive) heart failure: Secondary | ICD-10-CM | POA: Diagnosis not present

## 2022-05-04 LAB — BASIC METABOLIC PANEL
Anion gap: 8 (ref 5–15)
BUN: 53 mg/dL — ABNORMAL HIGH (ref 8–23)
CO2: 30 mmol/L (ref 22–32)
Calcium: 8.4 mg/dL — ABNORMAL LOW (ref 8.9–10.3)
Chloride: 96 mmol/L — ABNORMAL LOW (ref 98–111)
Creatinine, Ser: 1.26 mg/dL — ABNORMAL HIGH (ref 0.61–1.24)
GFR, Estimated: 58 mL/min — ABNORMAL LOW (ref >60)
Glucose, Bld: 100 mg/dL — ABNORMAL HIGH (ref 70–99)
Potassium: 4.3 mmol/L (ref 3.5–5.1)
Sodium: 134 mmol/L — ABNORMAL LOW (ref 135–145)

## 2022-05-04 LAB — CBC
HCT: 38.8 % — ABNORMAL LOW (ref 39.0–52.0)
Hemoglobin: 12.3 g/dL — ABNORMAL LOW (ref 13.0–17.0)
MCH: 28.3 pg (ref 26.0–34.0)
MCHC: 31.7 g/dL (ref 30.0–36.0)
MCV: 89.2 fL (ref 80.0–100.0)
Platelets: 199 10*3/uL (ref 150–400)
RBC: 4.35 MIL/uL (ref 4.22–5.81)
RDW: 14.7 % (ref 11.5–15.5)
WBC: 6.2 10*3/uL (ref 4.0–10.5)
nRBC: 0 % (ref 0.0–0.2)

## 2022-05-04 LAB — GLUCOSE, CAPILLARY
Glucose-Capillary: 117 mg/dL — ABNORMAL HIGH (ref 70–99)
Glucose-Capillary: 201 mg/dL — ABNORMAL HIGH (ref 70–99)
Glucose-Capillary: 203 mg/dL — ABNORMAL HIGH (ref 70–99)
Glucose-Capillary: 279 mg/dL — ABNORMAL HIGH (ref 70–99)

## 2022-05-04 MED ORDER — METOLAZONE 2.5 MG PO TABS
2.5000 mg | ORAL_TABLET | Freq: Once | ORAL | Status: AC
  Administered 2022-05-04: 2.5 mg via ORAL
  Filled 2022-05-04: qty 1

## 2022-05-04 NOTE — Progress Notes (Signed)
PROGRESS NOTE    Aaron Sosa  [id number] DOB: [birth year]  DOA: 04/28/2022 Date of Service: 05/04/22 PCP: Kirk Ruths, MD     Brief Narrative / Hospital Course:  Aaron Sosa is a 81 y.o. 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  Echocardiogram performed on 11/06/2021 showed ejection fraction of 25 to 30%.  With grade 2 diastolic dysfunction. Severe three-vessel CAD by cardiac catheterization 03/15/2020, not amenable to percutaneous or surgical revascularization, currently chest pain-free 06/30: Chest x-ray showed volume overload, BNP 2501, troponin 33. Admitted for acute on chronic combined systolic diastolic congestive heart failure, started on IV Lasix. 07/02.  Blood pressure is low, gave albumin, discontinue Entresto, add midodrine.  Cardiology consulted 07/04: renal fxn worsening, started lasix gtt 07/05: Cr improving. I&O as below - possible entry error, showing 5000+ mL IN which would be a problem. RN confirms this was entry error. Cardiology increased lasix from 4 to 8 mg/hr 07/06: sodium improving, renal function stable on higher dose lasix gtt, Net IO Since Admission: -3,797.07 mL [05/04/22 0843]. Lost IV access, pending replacement so has been off lasix gtt, if unable to replace this will transition to po   Consultants:  Cardiology   Procedures: None     Subjective: Pt reports no CP/SOB, still LE swelling.      ASSESSMENT & PLAN:   Principal Problem:   Acute on chronic combined systolic and diastolic congestive heart failure (HCC) Active Problems:   CAD (coronary artery disease)   Elevated troponin   Essential hypertension   Type II diabetes mellitus with renal manifestations (HCC)   Chronic kidney disease, stage 3a (HCC)   HLD (hyperlipidemia)   Hypokalemia   Type 2 diabetes mellitus (HCC)    Constipation   Chronic hypoxemic respiratory failure (HCC)   Hypotension   Acute on chronic combined systolic and diastolic congestive heart failure. Hypotension. Chronic hypoxemic respiratory failure. Elevated troponin secondary to congestive heart failure exacerbation. Patient diuresing is slowing down, renal function slightly worse 07/04 but improved 07/05 and stable 07/06.   Per cardiology, 07/04 started furosemide drip. Entresto and beta-blocker was on hold due to hypotension, currently on midodrine, then restarted on beta blocker  Net IO Since Admission: -3,332.74 mL [05/03/22 0833] --> -3,797.07 mL [05/04/22 0843].    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 Hypokalemia. Renal function getting worse 07/04, started on lasix gtt, better 07/05 and stable 07/06, will continue to follow.   Essential hypertension. Beta-blocker has been on hold d/t low BP, restarted 0/04 per cardiology .   Chronic constipation. Patient has more abdominal distention due to worsening constipation.  Bladder scan did not show significant urinary retention.  Water enema ordered x1 07/04, scheduled lactulose twice a day.     DVT prophylaxis: Lovenox  Code Status: FULL  Family Communication: pt asked if he would like me to call anyone today, he said no he is in communication w/ his daughter.  Disposition Plan / TOC needs: anticipate back to previous home environment, plan for [facility]  Barriers to discharge / significant pending items: cardiology following, still requiring diuresis, still SOB. May need to address code status w/ patient and family given EF 20-25 and severe CAD, but stable for now.  Objective: Vitals:   05/03/22 2326 05/04/22 0450 05/04/22 0452 05/04/22 0742  BP: 94/66 101/76  92/74  Pulse: 76 74  76  Resp: 20 20    Temp: 97.6 F (36.4 C) 97.7 F (36.5 C)  97.7 F (36.5 C)  TempSrc: Oral   Oral  SpO2: 100% 100%   100%  Weight:   101.2 kg   Height:        Intake/Output Summary (Last 24 hours) at 05/04/2022 0841 Last data filed at 05/04/2022 0742 Gross per 24 hour  Intake 560.67 ml  Output 1025 ml  Net -464.33 ml    Filed Weights   05/02/22 0402 05/03/22 0338 05/04/22 0452  Weight: 99.9 kg 101.3 kg 101.2 kg    Examination:  Constitutional:  VS as above General Appearance: alert, well-developed, well-nourished, NAD Eyes: Normal lids and conjunctive, non-icteric sclera Ears, Nose, Mouth, Throat: Normal appearance Neck: No masses, trachea midline Respiratory: Normal respiratory effort Breath sounds normal except slight crackles at bases Cardiovascular: S1/S2 normal, no murmur/rub/gallop auscultated +1-2 lower extremity edema Musculoskeletal:  No clubbing/cyanosis of digits Neurological: No cranial nerve deficit on limited exam Psychiatric: Normal judgment/insight Normal mood and affect       Scheduled Medications:   aspirin EC  81 mg Oral Daily   clopidogrel  75 mg Oral Daily   enoxaparin (LOVENOX) injection  0.5 mg/kg Subcutaneous Q24H   gabapentin  300 mg Oral TID   insulin aspart  0-5 Units Subcutaneous QHS   insulin aspart  0-9 Units Subcutaneous TID WC   midodrine  5 mg Oral TID WC   multivitamin-lutein  1 capsule Oral BID   polyethylene glycol  17 g Oral Daily   rosuvastatin  20 mg Oral QHS   senna-docusate  2 tablet Oral BID    Continuous Infusions:  furosemide (LASIX) 200 mg in dextrose 5 % 100 mL (2 mg/mL) infusion 8 mg/hr (05/04/22 0728)    PRN Medications:  acetaminophen, albuterol, calcium carbonate, dextromethorphan-guaiFENesin, hydrALAZINE, nitroGLYCERIN, ondansetron (ZOFRAN) IV, mouth rinse, simethicone  Antimicrobials:  Anti-infectives (From admission, onward)    None       Data Reviewed: I have personally reviewed following labs and imaging studies  CBC: Recent Labs  Lab 04/28/22 1203 05/02/22 0446 05/03/22 0627 05/04/22 0517  WBC  8.2 7.8 6.0 6.2  HGB 13.2 12.6* 11.8* 12.3*  HCT 42.1 40.1 37.5* 38.8*  MCV 90.1 90.1 88.2 89.2  PLT 218 234 188 199    Basic Metabolic Panel: Recent Labs  Lab 04/29/22 0419 04/29/22 1624 04/30/22 0425 05/01/22 0556 05/02/22 0446 05/03/22 0627 05/04/22 0517  NA 136  --  134* 134* 131* 132* 134*  K 3.0*   < > 3.8 3.9 4.5 3.7 4.3  CL 96*  --  98 98 94* 96* 96*  CO2 31  --  30 31 26 28 30   GLUCOSE 171*  --  110* 145* 129* 170* 100*  BUN 34*  --  35* 38* 47* 53* 53*  CREATININE 1.33*  --  1.22 1.26* 1.51* 1.22 1.26*  CALCIUM 8.4*  --  8.1* 8.4* 8.7* 8.4* 8.4*  MG 2.6*  --  2.5* 2.5* 2.5* 2.7*  --    < > = values in this interval not displayed.    GFR: Estimated Creatinine Clearance: 53.9 mL/min (A) (by C-G formula based on SCr of 1.26 mg/dL (H)). Liver Function Tests: Recent Labs  Lab 04/28/22 1203  AST 34  ALT 24  ALKPHOS 83  BILITOT 1.7*  PROT 6.7  ALBUMIN 3.8    No results for input(s): "LIPASE", "AMYLASE" in the last 168 hours. No results for input(s): "AMMONIA" in the last 168 hours. Coagulation Profile: No results for input(s): "INR", "PROTIME" in the last 168 hours. Cardiac Enzymes: No results for input(s): "CKTOTAL", "CKMB", "CKMBINDEX", "TROPONINI" in the last 168 hours. BNP (last 3 results) No results for input(s): "PROBNP" in the last 8760 hours. HbA1C: No results for input(s): "HGBA1C" in the last 72 hours. CBG: Recent Labs  Lab 05/03/22 0849 05/03/22 1144 05/03/22 1551 05/03/22 2049 05/04/22 0742  GLUCAP 155* 255* 320* 224* 117*    Lipid Profile: No results for input(s): "CHOL", "HDL", "LDLCALC", "TRIG", "CHOLHDL", "LDLDIRECT" in the last 72 hours. Thyroid Function Tests: No results for input(s): "TSH", "T4TOTAL", "FREET4", "T3FREE", "THYROIDAB" in the last 72 hours. Anemia Panel: No results for input(s): "VITAMINB12", "FOLATE", "FERRITIN", "TIBC", "IRON", "RETICCTPCT" in the last 72 hours. Urine analysis:    Component Value Date/Time    COLORURINE YELLOW 03/29/2022 2228   APPEARANCEUR CLEAR 03/29/2022 2228   LABSPEC 1.015 03/29/2022 2228   PHURINE 6.0 03/29/2022 2228   GLUCOSEU >=500 (A) 03/29/2022 2228   HGBUR NEGATIVE 03/29/2022 2228   BILIRUBINUR NEGATIVE 03/29/2022 2228   KETONESUR 5 (A) 03/29/2022 2228   PROTEINUR NEGATIVE 03/29/2022 2228   NITRITE NEGATIVE 03/29/2022 2228   LEUKOCYTESUR NEGATIVE 03/29/2022 2228   Sepsis Labs: @LABRCNTIP (procalcitonin:4,lacticidven:4)  No results found for this or any previous visit (from the past 240 hour(s)).       Radiology Studies last 96 hours: No results found.          LOS: 6 days       , DO [facility] 05/04/2022, 8:41 AM   Staff may message me via secure chat in Epic  but this may not receive immediate response,  please page for urgent matters!  If 7PM-7AM, please contact night-coverage [email or web address]  Dictation software was used to generate the above note. Typos may occur and escape review, as with typed/written notes. Please contact Dr 07/05/2022 directly for clarity if needed.

## 2022-05-04 NOTE — Progress Notes (Signed)
[facility] Cardiology  CARDIOLOGY CONSULT NOTE  Patient ID: Aaron Sosa MRN: [id number] DOB/AGE: 1941-03-01 81 y.o.  Admit date: 04/28/2022 Referring Physician Chipper Herb Primary Physician [facility] Primary Cardiologist Gwen Pounds Reason for Consultation congestive heart failure  HPI: 81 year old gentleman with a history of HFrEF (EF 25 to 30% with chronic hypotension), CAD (3V disease not amenable to PCI by cath 03/15/2020), CKD who was admitted to the hospital with heart failure exacerbation.  Interval history: -Alert and sitting up on edge of bed eating breakfast -no BM yesterday, made some urine last night but still very swollen in his legs   Review of systems complete and found to be negative unless listed above     Past Medical History:  Diagnosis Date   Arthritis    CHF (congestive heart failure) (HCC)    Diabetes mellitus without complication (HCC)    HOH (hard of hearing)    Lower extremity edema     Past Surgical History:  Procedure Laterality Date   CATARACT EXTRACTION Left    CATARACT EXTRACTION W/PHACO Right 05/06/2015   Procedure: CATARACT EXTRACTION PHACO AND INTRAOCULAR LENS PLACEMENT (IOC);  Surgeon: Lia Hopping, MD;  Location: [facility] ORS;  Service: Ophthalmology;  Laterality: Right;  [place] 1:14.7         AP   11.2          CDE   8.43    cassette lot #[id number]   RETINAL DETACHMENT SURGERY     RIGHT/LEFT HEART CATH AND CORONARY ANGIOGRAPHY N/A 03/17/2020   Procedure: RIGHT/LEFT HEART CATH AND CORONARY ANGIOGRAPHY;  Surgeon: Lamar Blinks, MD;  Location: [facility] INVASIVE CV LAB;  Service: Cardiovascular;  Laterality: N/A;   WRIST FRACTURE SURGERY  1958    Medications Prior to Admission  Medication Sig Dispense Refill Last Dose   acetaminophen (TYLENOL) 500 MG tablet Take 2 tablets (1,000 mg total) by mouth every 8 (eight) hours as needed for mild pain, moderate pain or headache. 30 tablet 0 Unknown at PRN   aspirin EC 81 MG EC tablet Take 1 tablet (81 mg total) by mouth  daily.   04/28/2022 at 0800   calcium carbonate (TUMS - DOSED IN MG ELEMENTAL CALCIUM) 500 MG chewable tablet Chew 1 tablet (200 mg of elemental calcium total) by mouth 3 (three) times daily as needed for indigestion or heartburn.   Unknown at PRN   clopidogrel (PLAVIX) 75 MG tablet Take 75 mg by mouth daily.   Past Week at Unknown   dapagliflozin propanediol (FARXIGA) 5 MG TABS tablet Take 5 mg by mouth daily.   Past Week at Unknown   docusate sodium (COLACE) 100 MG capsule Take 1 capsule (100 mg total) by mouth 2 (two) times daily as needed for mild constipation or moderate constipation. 10 capsule 0 Unknown at PRN   fluticasone-salmeterol (ADVAIR) 100-50 MCG/ACT AEPB Inhale 1 puff into the lungs 2 (two) times daily.   Unknown at Unknown   glipiZIDE (GLUCOTROL XL) 10 MG 24 hr tablet Take 10 mg by mouth daily.   Past Week at Unknown   magnesium oxide (MAG-OX) 400 MG tablet Take 400 mg by mouth 2 (two) times daily.      metFORMIN (GLUCOPHAGE-XR) 500 MG 24 hr tablet Take 1,000 mg by mouth daily with supper.   Unknown at Unknown   metolazone (ZAROXOLYN) 5 MG tablet Take 5 mg by mouth See admin instructions. Take 1 tablet (5 mg total) by mouth every Tuesday, Thursday, Saturday, and Sunday   Past Week at Unknown  nitroGLYCERIN (NITROSTAT) 0.4 MG SL tablet Place 0.4 mg under the tongue every 5 (five) minutes as needed for chest pain.   Unknown at PRN   [facility] ULTRA test strip Use to check blood sugar up to 2 x daily 50 each 12    polyethylene glycol powder (GLYCOLAX/MIRALAX) 17 GM/SCOOP powder Take 17 g by mouth daily as needed. Take 1 packet (17 g total) by mouth once daily as needed for Constipation Mix in 4-8ounces of fluid prior to taking.   Unknown at PRN   potassium chloride (KLOR-CON M) 10 MEQ tablet Take 10 mEq by mouth daily as needed (supplementation).   Unknown at PRN   rosuvastatin (CRESTOR) 20 MG tablet Take 20 mg by mouth at bedtime.   Past Week at Unknown   senna-docusate (SENOKOT-S)  8.6-50 MG tablet Take 2 tablets by mouth 2 (two) times daily as needed for constipation.   Unknown at PRN   torsemide (DEMADEX) 100 MG tablet Take 100 mg by mouth daily.   Past Week at Unknown   gabapentin (NEURONTIN) 300 MG capsule Take 1 capsule (300 mg total) by mouth 3 (three) times daily. (Patient not taking: Reported on 04/28/2022)   Not Taking   Social History   Socioeconomic History   Marital status: Widowed    Spouse name: Not on file   Number of children: Not on file   Years of education: High School   Highest education level: High school graduate  Occupational History   Not on file  Tobacco Use   Smoking status: Former    Packs/day: 2.00    Years: 5.00    Total pack years: 10.00    Types: Cigarettes   Smokeless tobacco: Former  Scientific laboratory technician Use: Never used  Substance and Sexual Activity   Alcohol use: Yes    Alcohol/week: 1.0 standard drink of alcohol    Types: 1 Cans of beer per week   Drug use: Never   Sexual activity: Not on file  Other Topics Concern   Not on file  Social History Narrative   Not on file   Social Determinants of Health   Financial Resource Strain: Not on file  Food Insecurity: Not on file  Transportation Needs: Not on file  Physical Activity: Inactive (07/16/2019)   Exercise Vital Sign    Days of Exercise per Week: 0 days    Minutes of Exercise per Session: 0 min  Stress: Not on file  Social Connections: Not on file  Intimate Partner Violence: Not on file    Family History  Problem Relation Age of Onset   Heart attack Mother    Diabetes Mellitus II Mother    Heart attack Father        PHYSICAL EXAM  General: Elderly Caucasian male sitting upright on edge of bed HEENT:  Normocephalic and atraumatic. Neck:  No JVD.  Lungs: Poor air movement bilaterally without appreciable crackles. Heart: HRRR . Normal S1 and S2 without gallops or murmurs.  Abdomen: obese appearing Msk:   Normal strength and tone for age. Extremities:  2+ woody edema, chronic venous stasis changes.  Warm to the touch bilaterally Neuro: Alert and oriented X 3. Psych:  Good affect, responds appropriately  Labs:   Lab Results  Component Value Date   WBC 6.2 05/04/2022   HGB 12.3 (L) 05/04/2022   HCT 38.8 (L) 05/04/2022   MCV 89.2 05/04/2022   PLT 199 05/04/2022    Recent Labs  Lab 04/28/22 1203  04/29/22 0419 05/04/22 0517  NA 133*   < > 134*  K 3.0*   < > 4.3  CL 90*   < > 96*  CO2 29   < > 30  BUN 34*   < > 53*  CREATININE 1.49*   < > 1.26*  CALCIUM 8.7*   < > 8.4*  PROT 6.7  --   --   BILITOT 1.7*  --   --   ALKPHOS 83  --   --   ALT 24  --   --   AST 34  --   --   GLUCOSE 253*   < > 100*   < > = values in this interval not displayed.    No results found for: "CKTOTAL", "CKMB", "CKMBINDEX", "TROPONINI"  Lab Results  Component Value Date   CHOL 153 04/29/2022   CHOL 171 03/16/2020   CHOL 234 (H) 12/02/2019   Lab Results  Component Value Date   HDL 44 04/29/2022   HDL 43 03/16/2020   HDL 57 12/02/2019   Lab Results  Component Value Date   LDLCALC 98 04/29/2022   LDLCALC 103 (H) 03/16/2020   LDLCALC 151 (H) 12/02/2019   Lab Results  Component Value Date   TRIG 57 04/29/2022   TRIG 123 03/16/2020   TRIG 140 12/02/2019   Lab Results  Component Value Date   CHOLHDL 3.5 04/29/2022   CHOLHDL 4.0 03/16/2020   CHOLHDL 4.1 12/02/2019   No results found for: "LDLDIRECT"    Radiology: DG Chest 2 View  Result Date: 04/28/2022 CLINICAL DATA:  Shortness of breath EXAM: CHEST - 2 VIEW COMPARISON:  Radiograph 11/05/2021 FINDINGS: Unchanged cardiomegaly. There are mild bilateral interstitial opacities. There is a small right pleural effusion and adjacent basilar atelectasis. No pneumothorax. No acute osseous abnormality. IMPRESSION: Mild pulmonary edema and small right pleural effusion with adjacent atelectasis. Unchanged cardiomegaly. Electronically Signed   By: Caprice Renshaw M.D.   On: 04/28/2022 12:50    EKG:  Sinus rhythm 72 bpm  ASSESSMENT AND PLAN:   1.  Acute on chronic systolic congestive heart failure, HFrEF, LVEF 25 to 30% 11/06/2021, BNP 2500 on presentation. net output 1600 cc 04/28/2022, 840 cc 04/29/2022, 637cc 04/30/2022. Has not been consistently receiving diuretic since that time d/t BP. 2.  Coronary artery disease, severe three-vessel CAD by cardiac catheterization 03/15/2020, not amenable to percutaneous or surgical revascularization, currently chest pain-free, with unremarkable high-sensitivity troponin 33 and 33.  ECG reveals sinus rhythm without ischemic ST-T wave changes. 3.  Chronic kidney disease stage IIIa, improving with diuresis with creatinine 1.22, GFR 60 today 4.  Low blood pressure, on midodrine 5.  Medication non-compliance, patient admits to not taking metolazone, and torsemide intermittently d/t inability to sleep at night d/t frequent urination.  Recommendations  1.  Agree with overall current therapy 2.  Continue lasix infusion at 8 mg/hr 3.  Give metolazone 2.5mg  x1  4.  Carefully monitor renal status 5.  Wean midodrine at tolerated, hopefully add low dose metoprolol XL pending clinical course  This patient's plan of care was discussed and created with Dr. Beatrix Fetters and he is in agreement.    Signed: Rebeca Allegra, PA-C 05/04/2022, 9:02 AM

## 2022-05-04 NOTE — Inpatient Diabetes Management (Signed)
Inpatient Diabetes Program Recommendations  AACE/ADA: New Consensus Statement on Inpatient Glycemic Control   Target Ranges:  Prepandial:   less than 140 mg/dL      Peak postprandial:   less than 180 mg/dL (1-2 hours)      Critically ill patients:  140 - 180 mg/dL    Latest Reference Range & Units 05/03/22 08:49 05/03/22 11:44 05/03/22 15:51 05/03/22 20:49 05/04/22 07:42  Glucose-Capillary 70 - 99 mg/dL 242 (H) 353 (H) 614 (H) 224 (H) 117 (H)   Review of Glycemic Control  Diabetes history: DM2 Outpatient Diabetes medications: Farxiga 5 mg daily, Glipizide XL 10 mg daily, Metformin 1000 mg QPM Current orders for Inpatient glycemic control: Novolog 0-9 units TID with meals, Novolog 0-5 units QHS  Inpatient Diabetes Program Recommendations:    Insulin: Please consider ordering Novolog 3 units TID with meals for meal coverage if patient eats at least 50% of meals.  Thanks, Orlando Penner, RN, MSN, CDCES Diabetes Coordinator Inpatient Diabetes Program [phone] (Team Pager from 8am to 5pm)

## 2022-05-04 NOTE — Progress Notes (Signed)
Occupational Therapy Treatment Patient Details Name: Aaron Sosa MRN: [id number] DOB: 04-Apr-1941 Today's Date: 05/04/2022   History of present illness Patient is an 81 year old male who presents with shortness of breath for approximately 4 weeks which has worsened in past few days. Patient has orthopnea and constipation. upon admission to ED patient with CHF exacerbation. PMH includes arthritis, CHF, DM, HOH, LE edema, obesity, leukocytosis, myalgia, SOB.   OT comments  Upon entering the room, pt seated on EOB and reports having just finished breakfast. Pt is agreeable to OT intervention and very pleasant but HOH. Pt stands with supervision and ambulates with RW into hallway 100' with supervision. Pt returns back to room and stands at sink for grooming and hand hygiene with supervision as well. No LOB this session. Pt declines toileting this session and requests to sit in recliner chair. Call bell and all needs within reach upon exiting the room.    Recommendations for follow up therapy are one component of a multi-disciplinary discharge planning process, led by the attending physician.  Recommendations may be updated based on patient status, additional functional criteria and insurance authorization.    Follow Up Recommendations  No OT follow up    Assistance Recommended at Discharge Set up Supervision/Assistance  Patient can return home with the following  A little help with walking and/or transfers;A little help with bathing/dressing/bathroom;Help with stairs or ramp for entrance;Assist for transportation;Assistance with cooking/housework   Equipment Recommendations  None recommended by OT       Precautions / Restrictions Precautions Precautions: Fall       Mobility Bed Mobility Overal bed mobility: Needs Assistance Bed Mobility: Supine to Sit     Supine to sit: Modified independent (Device/Increase time)          Transfers Overall transfer level: Needs  assistance Equipment used: Rolling walker (2 wheels) Transfers: Sit to/from Stand Sit to Stand: Supervision                 Balance Overall balance assessment: Needs assistance Sitting-balance support: Feet supported Sitting balance-Leahy Scale: Normal     Standing balance support: Reliant on assistive device for balance Standing balance-Leahy Scale: Good                             ADL either performed or assessed with clinical judgement   ADL                                         General ADL Comments: supervision overall with use of RW    Extremity/Trunk Assessment Upper Extremity Assessment Upper Extremity Assessment: Generalized weakness   Lower Extremity Assessment Lower Extremity Assessment: Generalized weakness        Vision Patient Visual Report: No change from baseline            Cognition Arousal/Alertness: Awake/alert Behavior During Therapy: WFL for tasks assessed/performed Overall Cognitive Status: Within Functional Limits for tasks assessed                                                     Pertinent Vitals/ Pain       Pain Assessment Pain Assessment: No/denies pain  Frequency  Min 2X/week        Progress Toward Goals  OT Goals(current goals can now be found in the care plan section)  Progress towards OT goals: Progressing toward goals  Acute Rehab OT Goals Patient Stated Goal: to go home OT Goal Formulation: With patient/family Time For Goal Achievement: 05/15/22 Potential to Achieve Goals: Good  Plan Discharge plan remains appropriate;Frequency remains appropriate       AM-PAC OT "6 Clicks" Daily Activity     Outcome Measure   Help from another person eating meals?: None Help from another person taking care of personal grooming?: None Help from another person toileting, which includes using toliet, bedpan, or urinal?: A Little Help from another person bathing  (including washing, rinsing, drying)?: A Little Help from another person to put on and taking off regular upper body clothing?: None Help from another person to put on and taking off regular lower body clothing?: A Little 6 Click Score: 21    End of Session Equipment Utilized During Treatment: Rolling walker (2 wheels)  OT Visit Diagnosis: Unsteadiness on feet (R26.81);Muscle weakness (generalized) (M62.81)   Activity Tolerance Patient tolerated treatment well;Patient limited by fatigue   Patient Left with call bell/phone within reach;in chair;with chair alarm set   Nurse Communication Mobility status        Time: 6553-7482 OT Time Calculation (min): 16 min  Charges: OT General Charges $OT Visit: 1 Visit OT Treatments $Self Care/Home Management : 8-22 mins  Jackquline Denmark, MS, OTR/L , CBIS ascom [phone]  05/04/22, 1:02 PM

## 2022-05-04 NOTE — Consult Note (Signed)
   Heart Failure Nurse Navigator Note  HFrEF 25-30%.  Severe global hypokinesis.  Grade 2 diastolic dysfunction.  Right ventricular systolic function is moderately reduced.  Right ventricle is mildly enlarged.  Moderate biatrial enlargement.  Mild to moderate mitral regurgitation.  Presented from home with complaints of progressively worsening shortness of breath.  Worsening by lateral lower leg edema and abdominal swelling.  He had also complained of orthopnea.  BNP was elevated at 2501.  Comorbidities:  Hypertension Hyperlipidemia Diabetes Hard of hearing Coronary artery disease Chronic kidney disease stage III Medication noncompliance  Medications:  Aspirin 81 mg daily Clopidogrel 75 mg daily Furosemide infusion currently at 8 mg an hour Midodrine 5 mg 3 times a day with meals Crestor 20 mg at bedtime  Labs:  Sodium 134, potassium 4.3, chloride 96, CO2 30, BUN 53, creatinine 1.26, estimated GFR 58. Weight is 101.2 kg Blood pressure 96/62 Intake 554 mL Output 825 mL   Initial meeting with patient.  Had spoke last week with his daughter, Dewayne Hatch with whom he lives with, he had been asleep at the time.  She states at home that he does not weigh himself daily and takes his medications as he wants.  With meeting with the patient today there are no family members at the bedside.  He is very hard of hearing and also has poor eyesight.  He states that he has a scale at home that is a talking scale but due to his poor hearing he is unable to understand what the scale is saying for his weight.  When questioned he states that he does not add salt at the table nor does he drink more fluids than 64 ounces that is recommended.  Told him that I would stop by another time when family members were present.  He was given the living with heart failure teaching booklet, zone magnet, info on heart failure and low-sodium along with weight chart.  He had no further questions.  Tresa Endo RN  CHFN

## 2022-05-04 NOTE — Care Management Important Message (Signed)
Important Message  Patient Details  Name: Aaron Sosa MRN: [id number] Date of Birth: August 04, 1941   Medicare Important Message Given:  Yes     Johnell Comings 05/04/2022, 3:27 PM

## 2022-05-04 NOTE — Plan of Care (Signed)
  Problem: Coping: Goal: Ability to adjust to condition or change in health will improve Outcome: Progressing   Problem: Nutritional: Goal: Maintenance of adequate nutrition will improve Outcome: Progressing   Problem: Activity: Goal: Risk for activity intolerance will decrease Outcome: Progressing   Problem: Nutrition: Goal: Adequate nutrition will be maintained Outcome: Progressing   Problem: Coping: Goal: Level of anxiety will decrease Outcome: Progressing   Problem: Safety: Goal: Ability to remain free from injury will improve Outcome: Progressing   Problem: Skin Integrity: Goal: Risk for impaired skin integrity will decrease Outcome: Progressing

## 2022-05-05 DIAGNOSIS — I5043 Acute on chronic combined systolic (congestive) and diastolic (congestive) heart failure: Secondary | ICD-10-CM | POA: Diagnosis not present

## 2022-05-05 LAB — CBC
HCT: 38 % — ABNORMAL LOW (ref 39.0–52.0)
Hemoglobin: 12.1 g/dL — ABNORMAL LOW (ref 13.0–17.0)
MCH: 28.1 pg (ref 26.0–34.0)
MCHC: 31.8 g/dL (ref 30.0–36.0)
MCV: 88.4 fL (ref 80.0–100.0)
Platelets: 186 10*3/uL (ref 150–400)
RBC: 4.3 MIL/uL (ref 4.22–5.81)
RDW: 14.9 % (ref 11.5–15.5)
WBC: 5.5 10*3/uL (ref 4.0–10.5)
nRBC: 0 % (ref 0.0–0.2)

## 2022-05-05 LAB — GLUCOSE, CAPILLARY
Glucose-Capillary: 108 mg/dL — ABNORMAL HIGH (ref 70–99)
Glucose-Capillary: 187 mg/dL — ABNORMAL HIGH (ref 70–99)
Glucose-Capillary: 208 mg/dL — ABNORMAL HIGH (ref 70–99)
Glucose-Capillary: 223 mg/dL — ABNORMAL HIGH (ref 70–99)

## 2022-05-05 LAB — BASIC METABOLIC PANEL
Anion gap: 10 (ref 5–15)
BUN: 50 mg/dL — ABNORMAL HIGH (ref 8–23)
CO2: 30 mmol/L (ref 22–32)
Calcium: 8.8 mg/dL — ABNORMAL LOW (ref 8.9–10.3)
Chloride: 95 mmol/L — ABNORMAL LOW (ref 98–111)
Creatinine, Ser: 1.1 mg/dL (ref 0.61–1.24)
GFR, Estimated: >60 mL/min (ref >60)
Glucose, Bld: 100 mg/dL — ABNORMAL HIGH (ref 70–99)
Potassium: 3.3 mmol/L — ABNORMAL LOW (ref 3.5–5.1)
Sodium: 135 mmol/L (ref 135–145)

## 2022-05-05 MED ORDER — POTASSIUM CHLORIDE CRYS ER 20 MEQ PO TBCR
40.0000 meq | EXTENDED_RELEASE_TABLET | Freq: Two times a day (BID) | ORAL | Status: AC
Start: 2022-05-05 — End: 2022-05-05
  Administered 2022-05-05 (×2): 40 meq via ORAL
  Filled 2022-05-05 (×2): qty 2

## 2022-05-05 MED ORDER — METOLAZONE 2.5 MG PO TABS
2.5000 mg | ORAL_TABLET | Freq: Once | ORAL | Status: AC
  Administered 2022-05-05: 2.5 mg via ORAL
  Filled 2022-05-05: qty 1

## 2022-05-05 NOTE — Plan of Care (Signed)
Problem: Education: Goal: Ability to describe self-care measures that may prevent or decrease complications (Diabetes Survival Skills Education) will improve 05/05/2022 0033 by Sheria Lang, RN Outcome: Progressing 05/05/2022 0033 by Sheria Lang, RN Outcome: Progressing Goal: Individualized Educational Video(s) 05/05/2022 0033 by Sheria Lang, RN Outcome: Progressing 05/05/2022 0033 by Sheria Lang, RN Outcome: Progressing   Problem: Coping: Goal: Ability to adjust to condition or change in health will improve 05/05/2022 0033 by Sheria Lang, RN Outcome: Progressing 05/05/2022 0033 by Sheria Lang, RN Outcome: Progressing   Problem: Fluid Volume: Goal: Ability to maintain a balanced intake and output will improve 05/05/2022 0033 by Sheria Lang, RN Outcome: Progressing 05/05/2022 0033 by Sheria Lang, RN Outcome: Progressing   Problem: Health Behavior/Discharge Planning: Goal: Ability to identify and utilize available resources and services will improve 05/05/2022 0033 by Sheria Lang, RN Outcome: Progressing 05/05/2022 0033 by Sheria Lang, RN Outcome: Progressing Goal: Ability to manage health-related needs will improve 05/05/2022 0033 by Sheria Lang, RN Outcome: Progressing 05/05/2022 0033 by Sheria Lang, RN Outcome: Progressing   Problem: Metabolic: Goal: Ability to maintain appropriate glucose levels will improve 05/05/2022 0033 by Sheria Lang, RN Outcome: Progressing 05/05/2022 0033 by Sheria Lang, RN Outcome: Progressing   Problem: Nutritional: Goal: Maintenance of adequate nutrition will improve 05/05/2022 0033 by Sheria Lang, RN Outcome: Progressing 05/05/2022 0033 by Sheria Lang, RN Outcome: Progressing Goal: Progress toward achieving an optimal weight will improve 05/05/2022 0033 by Sheria Lang, RN Outcome: Progressing 05/05/2022 0033 by Sheria Lang, RN Outcome: Progressing   Problem: Skin Integrity: Goal: Risk for impaired skin integrity will decrease 05/05/2022 0033 by Sheria Lang,  RN Outcome: Progressing 05/05/2022 0033 by Sheria Lang, RN Outcome: Progressing   Problem: Tissue Perfusion: Goal: Adequacy of tissue perfusion will improve 05/05/2022 0033 by Sheria Lang, RN Outcome: Progressing 05/05/2022 0033 by Sheria Lang, RN Outcome: Progressing   Problem: Education: Goal: Ability to demonstrate management of disease process will improve 05/05/2022 0033 by Sheria Lang, RN Outcome: Progressing 05/05/2022 0033 by Sheria Lang, RN Outcome: Progressing Goal: Ability to verbalize understanding of medication therapies will improve 05/05/2022 0033 by Sheria Lang, RN Outcome: Progressing 05/05/2022 0033 by Sheria Lang, RN Outcome: Progressing Goal: Individualized Educational Video(s) 05/05/2022 0033 by Sheria Lang, RN Outcome: Progressing 05/05/2022 0033 by Sheria Lang, RN Outcome: Progressing   Problem: Activity: Goal: Capacity to carry out activities will improve 05/05/2022 0033 by Sheria Lang, RN Outcome: Progressing 05/05/2022 0033 by Sheria Lang, RN Outcome: Progressing   Problem: Cardiac: Goal: Ability to achieve and maintain adequate cardiopulmonary perfusion will improve 05/05/2022 0033 by Sheria Lang, RN Outcome: Progressing 05/05/2022 0033 by Sheria Lang, RN Outcome: Progressing   Problem: Education: Goal: Knowledge of General Education information will improve Description: Including pain rating scale, medication(s)/side effects and non-pharmacologic comfort measures 05/05/2022 0033 by Sheria Lang, RN Outcome: Progressing 05/05/2022 0033 by Sheria Lang, RN Outcome: Progressing   Problem: Health Behavior/Discharge Planning: Goal: Ability to manage health-related needs will improve 05/05/2022 0033 by Sheria Lang, RN Outcome: Progressing 05/05/2022 0033 by Sheria Lang, RN Outcome: Progressing   Problem: Clinical Measurements: Goal: Ability to maintain clinical measurements within normal limits will improve 05/05/2022 0033 by Sheria Lang, RN Outcome: Progressing 05/05/2022  0033 by Sheria Lang, RN Outcome: Progressing Goal: Will remain free from infection 05/05/2022 0033 by Sheria Lang, RN Outcome: Progressing 05/05/2022 0033 by Sheria Lang, RN Outcome: Progressing Goal: Diagnostic test results will improve 05/05/2022 0033 by Sheria Lang, RN Outcome: Progressing 05/05/2022 0033 by Sheria Lang, RN Outcome: Progressing Goal: Respiratory  complications will improve 05/05/2022 0033 by Sheria Lang, RN Outcome: Progressing 05/05/2022 0033 by Sheria Lang, RN Outcome: Progressing Goal: Cardiovascular complication will be avoided 05/05/2022 0033 by Sheria Lang, RN Outcome: Progressing 05/05/2022 0033 by Sheria Lang, RN Outcome: Progressing   Problem: Activity: Goal: Risk for activity intolerance will decrease 05/05/2022 0033 by Sheria Lang, RN Outcome: Progressing 05/05/2022 0033 by Sheria Lang, RN Outcome: Progressing   Problem: Nutrition: Goal: Adequate nutrition will be maintained 05/05/2022 0033 by Sheria Lang, RN Outcome: Progressing 05/05/2022 0033 by Sheria Lang, RN Outcome: Progressing   Problem: Coping: Goal: Level of anxiety will decrease 05/05/2022 0033 by Sheria Lang, RN Outcome: Progressing 05/05/2022 0033 by Sheria Lang, RN Outcome: Progressing   Problem: Elimination: Goal: Will not experience complications related to bowel motility 05/05/2022 0033 by Sheria Lang, RN Outcome: Progressing 05/05/2022 0033 by Sheria Lang, RN Outcome: Progressing Goal: Will not experience complications related to urinary retention 05/05/2022 0033 by Sheria Lang, RN Outcome: Progressing 05/05/2022 0033 by Sheria Lang, RN Outcome: Progressing   Problem: Pain Managment: Goal: General experience of comfort will improve 05/05/2022 0033 by Sheria Lang, RN Outcome: Progressing 05/05/2022 0033 by Sheria Lang, RN Outcome: Progressing   Problem: Safety: Goal: Ability to remain free from injury will improve 05/05/2022 0033 by Sheria Lang, RN Outcome: Progressing 05/05/2022 0033 by Sheria Lang, RN Outcome: Progressing   Problem: Skin Integrity: Goal: Risk for impaired skin integrity will decrease 05/05/2022 0033 by Sheria Lang, RN Outcome: Progressing 05/05/2022 0033 by Sheria Lang, RN Outcome: Progressing   Problem: Education: Goal: Knowledge of General Education information will improve Description: Including pain rating scale, medication(s)/side effects and non-pharmacologic comfort measures 05/05/2022 0033 by Sheria Lang, RN Outcome: Progressing 05/05/2022 0033 by Sheria Lang, RN Outcome: Progressing   Problem: Health Behavior/Discharge Planning: Goal: Ability to manage health-related needs will improve 05/05/2022 0033 by Sheria Lang, RN Outcome: Progressing 05/05/2022 0033 by Sheria Lang, RN Outcome: Progressing   Problem: Clinical Measurements: Goal: Ability to maintain clinical measurements within normal limits will improve 05/05/2022 0033 by Sheria Lang, RN Outcome: Progressing 05/05/2022 0033 by Sheria Lang, RN Outcome: Progressing Goal: Will remain free from infection 05/05/2022 0033 by Sheria Lang, RN Outcome: Progressing 05/05/2022 0033 by Sheria Lang, RN Outcome: Progressing Goal: Diagnostic test results will improve 05/05/2022 0033 by Sheria Lang, RN Outcome: Progressing 05/05/2022 0033 by Sheria Lang, RN Outcome: Progressing Goal: Respiratory complications will improve 05/05/2022 0033 by Sheria Lang, RN Outcome: Progressing 05/05/2022 0033 by Sheria Lang, RN Outcome: Progressing Goal: Cardiovascular complication will be avoided 05/05/2022 0033 by Sheria Lang, RN Outcome: Progressing 05/05/2022 0033 by Sheria Lang, RN Outcome: Progressing   Problem: Activity: Goal: Risk for activity intolerance will decrease 05/05/2022 0033 by Sheria Lang, RN Outcome: Progressing 05/05/2022 0033 by Sheria Lang, RN Outcome: Progressing   Problem: Nutrition: Goal: Adequate nutrition will be maintained 05/05/2022 0033 by Sheria Lang, RN Outcome: Progressing 05/05/2022 0033 by Sheria Lang, RN Outcome: Progressing   Problem: Coping: Goal: Level of anxiety will decrease 05/05/2022 0033 by Sheria Lang, RN Outcome: Progressing 05/05/2022 0033 by Sheria Lang, RN Outcome: Progressing   Problem: Elimination: Goal: Will not experience complications related to bowel motility 05/05/2022 0033 by Sheria Lang, RN Outcome: Progressing 05/05/2022 0033 by Sheria Lang, RN Outcome: Progressing Goal: Will not experience complications related to urinary retention 05/05/2022 0033 by Sheria Lang, RN Outcome: Progressing 05/05/2022 0033 by Sheria Lang, RN Outcome: Progressing   Problem: Pain Managment: Goal: General experience of comfort will improve 05/05/2022 0033 by Sheria Lang,  RN Outcome: Progressing 05/05/2022 0033 by Bonner Puna, RN Outcome: Progressing   Problem: Safety: Goal: Ability to remain free from injury will improve 05/05/2022 0033 by Bonner Puna, RN Outcome: Progressing 05/05/2022 0033 by Bonner Puna, RN Outcome: Progressing

## 2022-05-05 NOTE — Inpatient Diabetes Management (Signed)
Inpatient Diabetes Program Recommendations  AACE/ADA: New Consensus Statement on Inpatient Glycemic Control   Target Ranges:  Prepandial:   less than 140 mg/dL      Peak postprandial:   less than 180 mg/dL (1-2 hours)      Critically ill patients:  140 - 180 mg/dL    Latest Reference Range & Units 05/05/22 07:57  Glucose-Capillary 70 - 99 mg/dL 671 (H)    Latest Reference Range & Units 05/04/22 07:42 05/04/22 12:22 05/04/22 16:38 05/04/22 20:11  Glucose-Capillary 70 - 99 mg/dL 245 (H) 809 (H) 983 (H) 279 (H)   Review of Glycemic Control  Diabetes history: DM2 Outpatient Diabetes medications: Farxiga 5 mg daily, Glipizide XL 10 mg daily, Metformin 1000 mg QPM Current orders for Inpatient glycemic control: Novolog 0-9 units TID with meals, Novolog 0-5 units QHS   Inpatient Diabetes Program Recommendations:     Insulin: Post prandial glucose consistently elevated.  Please consider ordering Novolog 3 units TID with meals for meal coverage if patient eats at least 50% of meals.  Thanks, Orlando Penner, RN, MSN, CDCES Diabetes Coordinator Inpatient Diabetes Program [phone] (Team Pager from 8am to 5pm)

## 2022-05-05 NOTE — Progress Notes (Signed)
[facility] Cardiology  CARDIOLOGY CONSULT NOTE  Patient ID: CARLON WININGS MRN: [id number] DOB/AGE: [birth year] 81 y.o.  Admit date: 04/28/2022 Referring Physician Roosevelt Locks Primary Physician [facility] Primary Cardiologist Nehemiah Massed Reason for Consultation congestive heart failure  HPI: 81 year old gentleman with a history of HFrEF (EF 25 to 30% with chronic hypotension), CAD (3V disease not amenable to PCI by cath 03/15/2020), CKD who was admitted to the hospital with heart failure exacerbation.  Interval history: -weaned to room air, net negative 3.6L yesterday with addition of metolazone 2.5mg  x1  -still no BM yesterday, this is his main concern  -no chest pain, thinks his breathing is better and swelling has somewhat improved   Review of systems complete and found to be negative unless listed above     Past Medical History:  Diagnosis Date   Arthritis    CHF (congestive heart failure) (HCC)    Diabetes mellitus without complication (HCC)    HOH (hard of hearing)    Lower extremity edema     Past Surgical History:  Procedure Laterality Date   CATARACT EXTRACTION Left    CATARACT EXTRACTION W/PHACO Right 05/06/2015   Procedure: CATARACT EXTRACTION PHACO AND INTRAOCULAR LENS PLACEMENT ([place]);  Surgeon: Lyla Glassing, MD;  Location: [facility] ORS;  Service: Ophthalmology;  Laterality: Right;  [place] 1:14.7         AP   11.2          CDE   8.43    cassette lot [id number]   RETINAL DETACHMENT SURGERY     RIGHT/LEFT HEART CATH AND CORONARY ANGIOGRAPHY N/A 03/17/2020   Procedure: RIGHT/LEFT HEART CATH AND CORONARY ANGIOGRAPHY;  Surgeon: Corey Skains, MD;  Location: [facility];  Service: Cardiovascular;  Laterality: N/A;   WRIST FRACTURE SURGERY  1958    Medications Prior to Admission  Medication Sig Dispense Refill Last Dose   acetaminophen (TYLENOL) 500 MG tablet Take 2 tablets (1,000 mg total) by mouth every 8 (eight) hours as needed for mild pain, moderate pain or headache. 30 tablet 0  Unknown at PRN   aspirin EC 81 MG EC tablet Take 1 tablet (81 mg total) by mouth daily.   04/28/2022 at 0800   calcium carbonate (TUMS - DOSED IN MG ELEMENTAL CALCIUM) 500 MG chewable tablet Chew 1 tablet (200 mg of elemental calcium total) by mouth 3 (three) times daily as needed for indigestion or heartburn.   Unknown at PRN   clopidogrel (PLAVIX) 75 MG tablet Take 75 mg by mouth daily.   Past Week at Unknown   dapagliflozin propanediol (FARXIGA) 5 MG TABS tablet Take 5 mg by mouth daily.   Past Week at Unknown   docusate sodium (COLACE) 100 MG capsule Take 1 capsule (100 mg total) by mouth 2 (two) times daily as needed for mild constipation or moderate constipation. 10 capsule 0 Unknown at PRN   fluticasone-salmeterol (ADVAIR) 100-50 MCG/ACT AEPB Inhale 1 puff into the lungs 2 (two) times daily.   Unknown at Unknown   glipiZIDE (GLUCOTROL XL) 10 MG 24 hr tablet Take 10 mg by mouth daily.   Past Week at Unknown   magnesium oxide (MAG-OX) 400 MG tablet Take 400 mg by mouth 2 (two) times daily.      metFORMIN (GLUCOPHAGE-XR) 500 MG 24 hr tablet Take 1,000 mg by mouth daily with supper.   Unknown at Unknown   metolazone (ZAROXOLYN) 5 MG tablet Take 5 mg by mouth See admin instructions. Take 1 tablet (5 mg total) by  mouth every Tuesday, Thursday, Saturday, and Sunday   Past Week at Unknown   nitroGLYCERIN (NITROSTAT) 0.4 MG SL tablet Place 0.4 mg under the tongue every 5 (five) minutes as needed for chest pain.   Unknown at PRN   [facility] ULTRA test strip Use to check blood sugar up to 2 x daily 50 each 12    polyethylene glycol powder (GLYCOLAX/MIRALAX) 17 GM/SCOOP powder Take 17 g by mouth daily as needed. Take 1 packet (17 g total) by mouth once daily as needed for Constipation Mix in 4-8ounces of fluid prior to taking.   Unknown at PRN   potassium chloride (KLOR-CON M) 10 MEQ tablet Take 10 mEq by mouth daily as needed (supplementation).   Unknown at PRN   rosuvastatin (CRESTOR) 20 MG tablet Take 20  mg by mouth at bedtime.   Past Week at Unknown   senna-docusate (SENOKOT-S) 8.6-50 MG tablet Take 2 tablets by mouth 2 (two) times daily as needed for constipation.   Unknown at PRN   torsemide (DEMADEX) 100 MG tablet Take 100 mg by mouth daily.   Past Week at Unknown   gabapentin (NEURONTIN) 300 MG capsule Take 1 capsule (300 mg total) by mouth 3 (three) times daily. (Patient not taking: Reported on 04/28/2022)   Not Taking   Social History   Socioeconomic History   Marital status: Widowed    Spouse name: Not on file   Number of children: Not on file   Years of education: High School   Highest education level: High school graduate  Occupational History   Not on file  Tobacco Use   Smoking status: Former    Packs/day: 2.00    Years: 5.00    Total pack years: 10.00    Types: Cigarettes   Smokeless tobacco: Former  Building services engineer Use: Never used  Substance and Sexual Activity   Alcohol use: Yes    Alcohol/week: 1.0 standard drink of alcohol    Types: 1 Cans of beer per week   Drug use: Never   Sexual activity: Not on file  Other Topics Concern   Not on file  Social History Narrative   Not on file   Social Determinants of Health   Financial Resource Strain: Not on file  Food Insecurity: Not on file  Transportation Needs: Not on file  Physical Activity: Inactive (07/16/2019)   Exercise Vital Sign    Days of Exercise per Week: 0 days    Minutes of Exercise per Session: 0 min  Stress: Not on file  Social Connections: Not on file  Intimate Partner Violence: Not on file    Family History  Problem Relation Age of Onset   Heart attack Mother    Diabetes Mellitus II Mother    Heart attack Father        PHYSICAL EXAM  General: Elderly Caucasian male sitting upright on edge of bed HEENT:  Normocephalic and atraumatic. Neck:  No JVD.  Lungs: Poor air movement bilaterally without appreciable crackles. Heart: HRRR . Normal S1 and S2 without gallops or murmurs.   Abdomen: obese appearing Msk:   Normal strength and tone for age. Extremities: 2+ woody edema, chronic venous stasis changes.  Warm to the touch bilaterally Neuro: Alert and oriented X 3. Psych:  Good affect, responds appropriately  Labs:   Lab Results  Component Value Date   WBC 5.5 05/05/2022   HGB 12.1 (L) 05/05/2022   HCT 38.0 (L) 05/05/2022   MCV 88.4  05/05/2022   PLT 186 05/05/2022    Recent Labs  Lab 04/28/22 1203 04/29/22 0419 05/05/22 0537  NA 133*   < > 135  K 3.0*   < > 3.3*  CL 90*   < > 95*  CO2 29   < > 30  BUN 34*   < > 50*  CREATININE 1.49*   < > 1.10  CALCIUM 8.7*   < > 8.8*  PROT 6.7  --   --   BILITOT 1.7*  --   --   ALKPHOS 83  --   --   ALT 24  --   --   AST 34  --   --   GLUCOSE 253*   < > 100*   < > = values in this interval not displayed.    No results found for: "CKTOTAL", "CKMB", "CKMBINDEX", "TROPONINI"  Lab Results  Component Value Date   CHOL 153 04/29/2022   CHOL 171 03/16/2020   CHOL 234 (H) 12/02/2019   Lab Results  Component Value Date   HDL 44 04/29/2022   HDL 43 03/16/2020   HDL 57 12/02/2019   Lab Results  Component Value Date   LDLCALC 98 04/29/2022   LDLCALC 103 (H) 03/16/2020   LDLCALC 151 (H) 12/02/2019   Lab Results  Component Value Date   TRIG 57 04/29/2022   TRIG 123 03/16/2020   TRIG 140 12/02/2019   Lab Results  Component Value Date   CHOLHDL 3.5 04/29/2022   CHOLHDL 4.0 03/16/2020   CHOLHDL 4.1 12/02/2019   No results found for: "LDLDIRECT"    Radiology: DG Chest 2 View  Result Date: 04/28/2022 CLINICAL DATA:  Shortness of breath EXAM: CHEST - 2 VIEW COMPARISON:  Radiograph 11/05/2021 FINDINGS: Unchanged cardiomegaly. There are mild bilateral interstitial opacities. There is a small right pleural effusion and adjacent basilar atelectasis. No pneumothorax. No acute osseous abnormality. IMPRESSION: Mild pulmonary edema and small right pleural effusion with adjacent atelectasis. Unchanged  cardiomegaly. Electronically Signed   By: Caprice Renshaw M.D.   On: 04/28/2022 12:50    EKG: Sinus rhythm 72 bpm  ASSESSMENT AND PLAN:   1.  Acute on chronic systolic congestive heart failure, HFrEF, LVEF 25 to 30% 11/06/2021, BNP 2500 on presentation. net output 1600 cc 04/28/2022, 840 cc 04/29/2022, 637cc 04/30/2022. Has not been consistently receiving diuretic since that time d/t BP. 2.  Coronary artery disease, severe three-vessel CAD by cardiac catheterization 03/15/2020, not amenable to percutaneous or surgical revascularization, currently chest pain-free, with unremarkable high-sensitivity troponin 33 and 33.  ECG reveals sinus rhythm without ischemic ST-T wave changes. 3.  Chronic kidney disease stage IIIa, improving with diuresis with creatinine 1.22, GFR 60 today 4.  Low blood pressure, on midodrine 5.  Medication non-compliance, patient admits to not taking metolazone, and torsemide intermittently d/t inability to sleep at night d/t frequent urination.  Recommendations  1.  Agree with overall current therapy 2.  Continue lasix infusion at 8 mg/hr 3.  S/p metolazone 2.5mg  x1 on 7/6 with excellent UOP, will give another dose of metolazone 2.5mg  today 4.  Carefully monitor renal status 5.  Wean midodrine at tolerated, hopefully add low dose metoprolol XL pending clinical course  This patient's plan of care was discussed and created with Dr. Beatrix Fetters and he is in agreement.    Signed: Rebeca Allegra, PA-C 05/05/2022, 9:20 AM

## 2022-05-05 NOTE — Progress Notes (Signed)
PROGRESS NOTE    Aaron Sosa  NFA:[id number] DOB: [birth year]  DOA: 04/28/2022 Date of Service: 05/05/22 PCP: Lauro Regulus, MD     Brief Narrative / Hospital Course:  Aaron Sosa is a 81 y.o. 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  Echocardiogram performed on 11/06/2021 showed ejection fraction of 25 to 30%.  With grade 2 diastolic dysfunction. Severe three-vessel CAD by cardiac catheterization 03/15/2020, not amenable to percutaneous or surgical revascularization, currently chest pain-free 06/30: Chest x-ray showed volume overload, BNP 2501, troponin 33. Admitted for acute on chronic combined systolic diastolic congestive heart failure, started on IV Lasix. 07/02.  Blood pressure is low, gave albumin, discontinue Entresto, add midodrine.  Cardiology consulted 07/04: renal fxn worsening, started lasix gtt 07/05: Cr improving. I&O as below - possible entry error, showing 5000+ mL IN which would be a problem. RN confirms this was entry error. Cardiology increased lasix from 4 to 8 mg/hr 07/06: sodium improving, renal function stable on higher dose lasix gtt, Net IO Since Admission: -3,797.07 mL [05/04/22 0843]. Metolazone 2.5mg  x1 on 7/6 with good UOP, 07/07: continuing lasix gtt 8 mg/h. Cardiology weaning midodrine as tolerated, plan give another dose of metolazone 2.5mg  today. Net IO Since Admission: -6,783.61 mL [05/05/22 1529]   Consultants:  Cardiology   Procedures: None     Subjective: Pt reports no CP/SOB, still LE swelling.      ASSESSMENT & PLAN:   Principal Problem:   Acute on chronic combined systolic and diastolic congestive heart failure (HCC) Active Problems:   CAD (coronary artery disease)   Elevated troponin   Essential hypertension   Type II diabetes mellitus with renal manifestations (HCC)    Chronic kidney disease, stage 3a (HCC)   HLD (hyperlipidemia)   Hypokalemia   Type 2 diabetes mellitus (HCC)   Constipation   Chronic hypoxemic respiratory failure (HCC)   Hypotension   Acute on chronic combined systolic and diastolic congestive heart failure. Hypotension. Chronic hypoxemic respiratory failure. Elevated troponin secondary to congestive heart failure exacerbation. Patient diuresing is slowing down, renal function slightly worse 07/04 but improved 07/05 and stable 07/06.   Per cardiology, 07/04 started furosemide drip. Entresto and beta-blocker was on hold due to hypotension, currently on midodrine, then restarted on beta blocker  Net IO Since Admission: -3,332.74 mL [05/03/22 0833] --> -3,797.07 mL [05/04/22 0843]. --> -[id number] mL [05/05/22 1529]    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 Hypokalemia. Renal function getting worse 07/04, started on lasix gtt, better 07/05 and stable 07/06-07/07, will continue to follow.   Essential hypertension. Beta-blocker had been on hold d/t low BP, restarted 07/04 per cardiology .   Chronic constipation. Patient has more abdominal distention due to worsening constipation.  Bladder scan did not show significant urinary retention.  Water enema ordered x1 07/04, scheduled lactulose twice a day.     DVT prophylaxis: Lovenox  Code Status: FULL  Family Communication: pt asked if he would like me to call anyone today, he said no he is in communication w/ his daughter.  Disposition Plan / TOC needs: anticipate back to previous home environment, plan for [name]  Barriers to discharge / significant pending items: cardiology following, still requiring diuresis, still SOB. May need to address code status w/ patient and family given EF 20-25  and severe CAD, but stable for now.              Objective: Vitals:   05/05/22 0442 05/05/22 0504 05/05/22 0758 05/05/22 1154  BP: 101/71   104/70 101/69  Pulse: 74  78 85  Resp: 18  18 18   Temp: 97.6 F (36.4 C)  97.7 F (36.5 C) 97.8 F (36.6 C)  TempSrc:   Oral   SpO2: 96%  94% 100%  Weight:  101.1 kg    Height:  5\' 8"  (1.727 m)      Intake/Output Summary (Last 24 hours) at 05/05/2022 1527 Last data filed at 05/05/2022 1414 Gross per 24 hour  Intake 748.46 ml  Output 3450 ml  Net -2701.54 ml    Filed Weights   05/03/22 0338 05/04/22 0452 05/05/22 0504  Weight: 101.3 kg 101.2 kg 101.1 kg    Examination:  Constitutional:  VS as above General Appearance: alert, well-developed, well-nourished, NAD Eyes: Normal lids and conjunctive, non-icteric sclera Ears, Nose, Mouth, Throat: Normal appearance Neck: No masses, trachea midline Respiratory: Normal respiratory effort Breath sounds normal except slight crackles at bases Cardiovascular: S1/S2 normal, no murmur/rub/gallop auscultated +1-2 lower extremity edema Musculoskeletal:  No clubbing/cyanosis of digits Neurological: No cranial nerve deficit on limited exam Psychiatric: Normal judgment/insight Normal mood and affect       Scheduled Medications:   aspirin EC  81 mg Oral Daily   clopidogrel  75 mg Oral Daily   enoxaparin (LOVENOX) injection  0.5 mg/kg Subcutaneous Q24H   insulin aspart  0-5 Units Subcutaneous QHS   insulin aspart  0-9 Units Subcutaneous TID WC   midodrine  5 mg Oral TID WC   multivitamin-lutein  1 capsule Oral BID   polyethylene glycol  17 g Oral Daily   potassium chloride  40 mEq Oral BID   rosuvastatin  20 mg Oral QHS   senna-docusate  2 tablet Oral BID    Continuous Infusions:  furosemide (LASIX) 200 mg in dextrose 5 % 100 mL (2 mg/mL) infusion 8 mg/hr (05/05/22 0745)    PRN Medications:  acetaminophen, albuterol, calcium carbonate, dextromethorphan-guaiFENesin, hydrALAZINE, nitroGLYCERIN, ondansetron (ZOFRAN) IV, mouth rinse, simethicone  Antimicrobials:  Anti-infectives (From admission, onward)    None        Data Reviewed: I have personally reviewed following labs and imaging studies  CBC: Recent Labs  Lab 05/02/22 0446 05/03/22 0627 05/04/22 0517 05/05/22 0537  WBC 7.8 6.0 6.2 5.5  HGB 12.6* 11.8* 12.3* 12.1*  HCT 40.1 37.5* 38.8* 38.0*  MCV 90.1 88.2 89.2 88.4  PLT 234 188 199 186    Basic Metabolic Panel: Recent Labs  Lab 04/29/22 0419 04/29/22 1624 04/30/22 0425 05/01/22 0556 05/02/22 0446 05/03/22 0627 05/04/22 0517 05/05/22 0537  NA 136  --  134* 134* 131* 132* 134* 135  K 3.0*   < > 3.8 3.9 4.5 3.7 4.3 3.3*  CL 96*  --  98 98 94* 96* 96* 95*  CO2 31  --  30 31 26 28 30 30   GLUCOSE 171*  --  110* 145* 129* 170* 100* 100*  BUN 34*  --  35* 38* 47* 53* 53* 50*  CREATININE 1.33*  --  1.22 1.26* 1.51* 1.22 1.26* 1.10  CALCIUM 8.4*  --  8.1* 8.4* 8.7* 8.4* 8.4* 8.8*  MG 2.6*  --  2.5* 2.5* 2.5* 2.7*  --   --    < > = values in this interval not displayed.    GFR: Estimated  Creatinine Clearance: 61.7 mL/min (by C-G formula based on SCr of 1.1 mg/dL). Liver Function Tests: No results for input(s): "AST", "ALT", "ALKPHOS", "BILITOT", "PROT", "ALBUMIN" in the last 168 hours.  No results for input(s): "LIPASE", "AMYLASE" in the last 168 hours. No results for input(s): "AMMONIA" in the last 168 hours. Coagulation Profile: No results for input(s): "INR", "PROTIME" in the last 168 hours. Cardiac Enzymes: No results for input(s): "CKTOTAL", "CKMB", "CKMBINDEX", "TROPONINI" in the last 168 hours. BNP (last 3 results) No results for input(s): "PROBNP" in the last 8760 hours. HbA1C: No results for input(s): "HGBA1C" in the last 72 hours. CBG: Recent Labs  Lab 05/04/22 1222 05/04/22 1638 05/04/22 2011 05/05/22 0757 05/05/22 1153  GLUCAP 201* 203* 279* 108* 187*    Lipid Profile: No results for input(s): "CHOL", "HDL", "LDLCALC", "TRIG", "CHOLHDL", "LDLDIRECT" in the last 72 hours. Thyroid Function Tests: No results for input(s): "TSH", "T4TOTAL", "FREET4",  "T3FREE", "THYROIDAB" in the last 72 hours. Anemia Panel: No results for input(s): "VITAMINB12", "FOLATE", "FERRITIN", "TIBC", "IRON", "RETICCTPCT" in the last 72 hours. Urine analysis:    Component Value Date/Time   COLORURINE YELLOW 03/29/2022 2228   APPEARANCEUR CLEAR 03/29/2022 2228   LABSPEC 1.015 03/29/2022 2228   PHURINE 6.0 03/29/2022 2228   GLUCOSEU >=500 (A) 03/29/2022 2228   HGBUR NEGATIVE 03/29/2022 2228   BILIRUBINUR NEGATIVE 03/29/2022 2228   KETONESUR 5 (A) 03/29/2022 2228   PROTEINUR NEGATIVE 03/29/2022 2228   NITRITE NEGATIVE 03/29/2022 2228   LEUKOCYTESUR NEGATIVE 03/29/2022 2228   Sepsis Labs: @LABRCNTIP (procalcitonin:4,lacticidven:4)  No results found for this or any previous visit (from the past 240 hour(s)).       Radiology Studies last 96 hours: No results found.          LOS: 7 days       Emeterio Reeve, DO [facility] 05/05/2022, 3:27 PM   Staff may message me via secure chat in [place]  but this may not receive immediate response,  please page for urgent matters!  If 7PM-7AM, please contact night-coverage [email or web address]  Dictation software was used to generate the above note. Typos may occur and escape review, as with typed/written notes. Please contact Dr Sheppard Coil directly for clarity if needed.

## 2022-05-05 NOTE — Plan of Care (Signed)
Problem: Education: Goal: Ability to describe self-care measures that may prevent or decrease complications (Diabetes Survival Skills Education) will improve Outcome: Progressing Goal: Individualized Educational Video(s) Outcome: Progressing   Problem: Coping: Goal: Ability to adjust to condition or change in health will improve Outcome: Progressing   Problem: Fluid Volume: Goal: Ability to maintain a balanced intake and output will improve Outcome: Progressing   Problem: Health Behavior/Discharge Planning: Goal: Ability to identify and utilize available resources and services will improve Outcome: Progressing Goal: Ability to manage health-related needs will improve Outcome: Progressing   Problem: Metabolic: Goal: Ability to maintain appropriate glucose levels will improve Outcome: Progressing   Problem: Nutritional: Goal: Maintenance of adequate nutrition will improve Outcome: Progressing Goal: Progress toward achieving an optimal weight will improve Outcome: Progressing   Problem: Skin Integrity: Goal: Risk for impaired skin integrity will decrease Outcome: Progressing   Problem: Tissue Perfusion: Goal: Adequacy of tissue perfusion will improve Outcome: Progressing   Problem: Education: Goal: Ability to demonstrate management of disease process will improve Outcome: Progressing Goal: Ability to verbalize understanding of medication therapies will improve Outcome: Progressing Goal: Individualized Educational Video(s) Outcome: Progressing   Problem: Activity: Goal: Capacity to carry out activities will improve Outcome: Progressing   Problem: Cardiac: Goal: Ability to achieve and maintain adequate cardiopulmonary perfusion will improve Outcome: Progressing   Problem: Education: Goal: Knowledge of General Education information will improve Description: Including pain rating scale, medication(s)/side effects and non-pharmacologic comfort measures Outcome:  Progressing   Problem: Health Behavior/Discharge Planning: Goal: Ability to manage health-related needs will improve Outcome: Progressing   Problem: Clinical Measurements: Goal: Ability to maintain clinical measurements within normal limits will improve Outcome: Progressing Goal: Will remain free from infection Outcome: Progressing Goal: Diagnostic test results will improve Outcome: Progressing Goal: Respiratory complications will improve Outcome: Progressing Goal: Cardiovascular complication will be avoided Outcome: Progressing   Problem: Activity: Goal: Risk for activity intolerance will decrease Outcome: Progressing   Problem: Nutrition: Goal: Adequate nutrition will be maintained Outcome: Progressing   Problem: Coping: Goal: Level of anxiety will decrease Outcome: Progressing   Problem: Elimination: Goal: Will not experience complications related to bowel motility Outcome: Progressing Goal: Will not experience complications related to urinary retention Outcome: Progressing   Problem: Pain Managment: Goal: General experience of comfort will improve Outcome: Progressing   Problem: Safety: Goal: Ability to remain free from injury will improve Outcome: Progressing   Problem: Skin Integrity: Goal: Risk for impaired skin integrity will decrease Outcome: Progressing   Problem: Education: Goal: Knowledge of General Education information will improve Description: Including pain rating scale, medication(s)/side effects and non-pharmacologic comfort measures Outcome: Progressing   Problem: Health Behavior/Discharge Planning: Goal: Ability to manage health-related needs will improve Outcome: Progressing   Problem: Clinical Measurements: Goal: Ability to maintain clinical measurements within normal limits will improve Outcome: Progressing Goal: Will remain free from infection Outcome: Progressing Goal: Diagnostic test results will improve Outcome:  Progressing Goal: Respiratory complications will improve Outcome: Progressing Goal: Cardiovascular complication will be avoided Outcome: Progressing   Problem: Activity: Goal: Risk for activity intolerance will decrease Outcome: Progressing   Problem: Nutrition: Goal: Adequate nutrition will be maintained Outcome: Progressing   Problem: Coping: Goal: Level of anxiety will decrease Outcome: Progressing   Problem: Elimination: Goal: Will not experience complications related to bowel motility Outcome: Progressing Goal: Will not experience complications related to urinary retention Outcome: Progressing   Problem: Pain Managment: Goal: General experience of comfort will improve Outcome: Progressing   Problem: Safety: Goal: Ability to  remain free from injury will improve Outcome: Progressing

## 2022-05-05 NOTE — Progress Notes (Signed)
Physical Therapy Treatment Patient Details Name: Aaron Sosa MRN: [id number] DOB: 1941/08/20 Today's Date: 05/05/2022   History of Present Illness Patient is an 81 year old male who presents with shortness of breath for approximately 4 weeks which has worsened in past few days. Patient has orthopnea and constipation. upon admission to ED patient with CHF exacerbation. PMH includes arthritis, CHF, DM, HOH, LE edema, obesity, leukocytosis, myalgia, SOB.    PT Comments    Patient easily woken at start of session, denied pain throughout. He was able to perform bed mobility modI with bed rails, good sitting balance noted. Sit <> stand with RW and supervision, pt still tends to keep RW outside BOS with transfers and with ambulation. He ambulated ~295ft with RW and CGA-supervision. 1 PT led rest break. Pt on room air throughout spO2 >95%. Returned to room and in bed at pt request. The patient would benefit from further skilled PT intervention to continue to progress towards goals. Recommendation remains appropriate.     Recommendations for follow up therapy are one component of a multi-disciplinary discharge planning process, led by the attending physician.  Recommendations may be updated based on patient status, additional functional criteria and insurance authorization.  Follow Up Recommendations  Home health PT     Assistance Recommended at Discharge Intermittent Supervision/Assistance  Patient can return home with the following Assistance with cooking/housework;Assist for transportation;Help with stairs or ramp for entrance   Equipment Recommendations  None recommended by PT    Recommendations for Other Services       Precautions / Restrictions Precautions Precautions: Fall Restrictions Weight Bearing Restrictions: No     Mobility  Bed Mobility Overal bed mobility: Modified Independent                  Transfers Overall transfer level: Needs assistance Equipment used:  Rolling walker (2 wheels) Transfers: Sit to/from Stand Sit to Stand: Supervision                Ambulation/Gait Ambulation/Gait assistance: Supervision Gait Distance (Feet): 160 Feet Assistive device: Rolling walker (2 wheels)         General Gait Details: improved cadence safety and distance today   Stairs             Wheelchair Mobility    Modified Rankin (Stroke Patients Only)       Balance Overall balance assessment: Needs assistance   Sitting balance-Leahy Scale: Normal       Standing balance-Leahy Scale: Good                              Cognition Arousal/Alertness: Awake/alert Behavior During Therapy: WFL for tasks assessed/performed Overall Cognitive Status: Within Functional Limits for tasks assessed                                          Exercises      General Comments        Pertinent Vitals/Pain Pain Assessment Pain Assessment: No/denies pain    Home Living                          Prior Function            PT Goals (current goals can now be found in the care plan section) Progress towards PT  goals: Progressing toward goals    Frequency    Min 2X/week      PT Plan Current plan remains appropriate    Co-evaluation              AM-PAC PT "6 Clicks" Mobility   Outcome Measure  Help needed turning from your back to your side while in a flat bed without using bedrails?: None Help needed moving from lying on your back to sitting on the side of a flat bed without using bedrails?: None Help needed moving to and from a bed to a chair (including a wheelchair)?: None Help needed standing up from a chair using your arms (e.g., wheelchair or bedside chair)?: None Help needed to walk in hospital room?: None Help needed climbing 3-5 steps with a railing? : A Little 6 Click Score: 23    End of Session Equipment Utilized During Treatment: Gait belt Activity Tolerance: Patient  tolerated treatment well Patient left: in bed;with call bell/phone within reach;with bed alarm set Nurse Communication: Mobility status PT Visit Diagnosis: Other abnormalities of gait and mobility (R26.89);Difficulty in walking, not elsewhere classified (R26.2)     Time: 2440-1027 PT Time Calculation (min) (ACUTE ONLY): 12 min  Charges:  $Therapeutic Activity: 8-22 mins                     Olga Coaster PT, DPT 2:37 PM,05/05/22

## 2022-05-06 DIAGNOSIS — I5043 Acute on chronic combined systolic (congestive) and diastolic (congestive) heart failure: Secondary | ICD-10-CM | POA: Diagnosis not present

## 2022-05-06 LAB — GLUCOSE, CAPILLARY
Glucose-Capillary: 171 mg/dL — ABNORMAL HIGH (ref 70–99)
Glucose-Capillary: 158 mg/dL — ABNORMAL HIGH (ref 70–99)
Glucose-Capillary: 182 mg/dL — ABNORMAL HIGH (ref 70–99)
Glucose-Capillary: 202 mg/dL — ABNORMAL HIGH (ref 70–99)

## 2022-05-06 LAB — BASIC METABOLIC PANEL
Anion gap: 13 (ref 5–15)
BUN: 52 mg/dL — ABNORMAL HIGH (ref 8–23)
CO2: 29 mmol/L (ref 22–32)
Calcium: 9.1 mg/dL (ref 8.9–10.3)
Chloride: 92 mmol/L — ABNORMAL LOW (ref 98–111)
Creatinine, Ser: 1.18 mg/dL (ref 0.61–1.24)
GFR, Estimated: >60 mL/min (ref >60)
Glucose, Bld: 112 mg/dL — ABNORMAL HIGH (ref 70–99)
Potassium: 3.4 mmol/L — ABNORMAL LOW (ref 3.5–5.1)
Sodium: 134 mmol/L — ABNORMAL LOW (ref 135–145)

## 2022-05-06 MED ORDER — POTASSIUM CHLORIDE CRYS ER 20 MEQ PO TBCR
40.0000 meq | EXTENDED_RELEASE_TABLET | Freq: Once | ORAL | Status: AC
Start: 2022-05-06 — End: 2022-05-06
  Administered 2022-05-06: 40 meq via ORAL
  Filled 2022-05-06: qty 2

## 2022-05-06 MED ORDER — METOLAZONE 5 MG PO TABS
5.0000 mg | ORAL_TABLET | Freq: Once | ORAL | Status: AC
  Administered 2022-05-06: 5 mg via ORAL
  Filled 2022-05-06: qty 1

## 2022-05-06 NOTE — Progress Notes (Signed)
[facility] Cardiology  CARDIOLOGY CONSULT NOTE  Patient ID: Aaron Sosa MRN: [id number] DOB/AGE: 03-21-1941 81 y.o.  Admit date: 04/28/2022 Referring Physician Chipper Herb Primary Physician [facility] Primary Cardiologist Gwen Pounds Reason for Consultation congestive heart failure  HPI: 81 year old gentleman with a history of HFrEF (EF 25 to 30% with chronic hypotension), CAD (3V disease not amenable to PCI by cath 03/15/2020), CKD who was admitted to the hospital with heart failure exacerbation.  Interval history: - No acute events.  - Continues to make good urine output.  - States his breathing is still not back to normal, but feels better than admission.  - 3 "good bowel movements" yesterday which seems to be helping.    Review of systems complete and found to be negative unless listed above     Past Medical History:  Diagnosis Date   Arthritis    CHF (congestive heart failure) (HCC)    Diabetes mellitus without complication (HCC)    HOH (hard of hearing)    Lower extremity edema     Past Surgical History:  Procedure Laterality Date   CATARACT EXTRACTION Left    CATARACT EXTRACTION W/PHACO Right 05/06/2015   Procedure: CATARACT EXTRACTION PHACO AND INTRAOCULAR LENS PLACEMENT (IOC);  Surgeon: Lia Hopping, MD;  Location: [facility] ORS;  Service: Ophthalmology;  Laterality: Right;  [place] 1:14.7         AP   11.2          CDE   8.43    cassette lot #[id number]   RETINAL DETACHMENT SURGERY     RIGHT/LEFT HEART CATH AND CORONARY ANGIOGRAPHY N/A 03/17/2020   Procedure: RIGHT/LEFT HEART CATH AND CORONARY ANGIOGRAPHY;  Surgeon: Lamar Blinks, MD;  Location: [facility] INVASIVE CV LAB;  Service: Cardiovascular;  Laterality: N/A;   WRIST FRACTURE SURGERY  1958    Medications Prior to Admission  Medication Sig Dispense Refill Last Dose   acetaminophen (TYLENOL) 500 MG tablet Take 2 tablets (1,000 mg total) by mouth every 8 (eight) hours as needed for mild pain, moderate pain or headache. 30 tablet 0 Unknown  at PRN   aspirin EC 81 MG EC tablet Take 1 tablet (81 mg total) by mouth daily.   04/28/2022 at 0800   calcium carbonate (TUMS - DOSED IN MG ELEMENTAL CALCIUM) 500 MG chewable tablet Chew 1 tablet (200 mg of elemental calcium total) by mouth 3 (three) times daily as needed for indigestion or heartburn.   Unknown at PRN   clopidogrel (PLAVIX) 75 MG tablet Take 75 mg by mouth daily.   Past Week at Unknown   dapagliflozin propanediol (FARXIGA) 5 MG TABS tablet Take 5 mg by mouth daily.   Past Week at Unknown   docusate sodium (COLACE) 100 MG capsule Take 1 capsule (100 mg total) by mouth 2 (two) times daily as needed for mild constipation or moderate constipation. 10 capsule 0 Unknown at PRN   fluticasone-salmeterol (ADVAIR) 100-50 MCG/ACT AEPB Inhale 1 puff into the lungs 2 (two) times daily.   Unknown at Unknown   glipiZIDE (GLUCOTROL XL) 10 MG 24 hr tablet Take 10 mg by mouth daily.   Past Week at Unknown   magnesium oxide (MAG-OX) 400 MG tablet Take 400 mg by mouth 2 (two) times daily.      metFORMIN (GLUCOPHAGE-XR) 500 MG 24 hr tablet Take 1,000 mg by mouth daily with supper.   Unknown at Unknown   metolazone (ZAROXOLYN) 5 MG tablet Take 5 mg by mouth See admin instructions. Take 1 tablet (5  mg total) by mouth every Tuesday, Thursday, Saturday, and Sunday   Past Week at Unknown   nitroGLYCERIN (NITROSTAT) 0.4 MG SL tablet Place 0.4 mg under the tongue every 5 (five) minutes as needed for chest pain.   Unknown at PRN   [name] ULTRA test strip Use to check blood sugar up to 2 x daily 50 each 12    polyethylene glycol powder (GLYCOLAX/MIRALAX) 17 GM/SCOOP powder Take 17 g by mouth daily as needed. Take 1 packet (17 g total) by mouth once daily as needed for Constipation Mix in 4-8ounces of fluid prior to taking.   Unknown at PRN   potassium chloride (KLOR-CON M) 10 MEQ tablet Take 10 mEq by mouth daily as needed (supplementation).   Unknown at PRN   rosuvastatin (CRESTOR) 20 MG tablet Take 20 mg by  mouth at bedtime.   Past Week at Unknown   senna-docusate (SENOKOT-S) 8.6-50 MG tablet Take 2 tablets by mouth 2 (two) times daily as needed for constipation.   Unknown at PRN   torsemide (DEMADEX) 100 MG tablet Take 100 mg by mouth daily.   Past Week at Unknown   gabapentin (NEURONTIN) 300 MG capsule Take 1 capsule (300 mg total) by mouth 3 (three) times daily. (Patient not taking: Reported on 04/28/2022)   Not Taking   Social History   Socioeconomic History   Marital status: Widowed    Spouse name: Not on file   Number of children: Not on file   Years of education: High School   Highest education level: High school graduate  Occupational History   Not on file  Tobacco Use   Smoking status: Former    Packs/day: 2.00    Years: 5.00    Total pack years: 10.00    Types: Cigarettes   Smokeless tobacco: Former  Scientific laboratory technician Use: Never used  Substance and Sexual Activity   Alcohol use: Yes    Alcohol/week: 1.0 standard drink of alcohol    Types: 1 Cans of beer per week   Drug use: Never   Sexual activity: Not on file  Other Topics Concern   Not on file  Social History Narrative   Not on file   Social Determinants of Health   Financial Resource Strain: Not on file  Food Insecurity: Not on file  Transportation Needs: Not on file  Physical Activity: Inactive (07/16/2019)   Exercise Vital Sign    Days of Exercise per Week: 0 days    Minutes of Exercise per Session: 0 min  Stress: Not on file  Social Connections: Not on file  Intimate Partner Violence: Not on file    Family History  Problem Relation Age of Onset   Heart attack Mother    Diabetes Mellitus II Mother    Heart attack Father        PHYSICAL EXAM  General: Elderly Caucasian male sitting upright on edge of bed HEENT:  Normocephalic and atraumatic. Neck:  No JVD.  Lungs: Poor air movement bilaterally without appreciable crackles. Heart: HRRR . Normal S1 and S2 without gallops or murmurs.  Abdomen:  obese appearing Msk:   Normal strength and tone for age. Extremities: 2+ woody edema, chronic venous stasis changes.  Warm to the touch bilaterally Neuro: Alert and oriented X 3. Psych:  Good affect, responds appropriately  Labs:   Lab Results  Component Value Date   WBC 5.5 05/05/2022   HGB 12.1 (L) 05/05/2022   HCT 38.0 (L) 05/05/2022  MCV 88.4 05/05/2022   PLT 186 05/05/2022    Recent Labs  Lab 05/06/22 0434  NA 134*  K 3.4*  CL 92*  CO2 29  BUN 52*  CREATININE 1.18  CALCIUM 9.1  GLUCOSE 112*    No results found for: "CKTOTAL", "CKMB", "CKMBINDEX", "TROPONINI"  Lab Results  Component Value Date   CHOL 153 04/29/2022   CHOL 171 03/16/2020   CHOL 234 (H) 12/02/2019   Lab Results  Component Value Date   HDL 44 04/29/2022   HDL 43 03/16/2020   HDL 57 12/02/2019   Lab Results  Component Value Date   LDLCALC 98 04/29/2022   LDLCALC 103 (H) 03/16/2020   LDLCALC 151 (H) 12/02/2019   Lab Results  Component Value Date   TRIG 57 04/29/2022   TRIG 123 03/16/2020   TRIG 140 12/02/2019   Lab Results  Component Value Date   CHOLHDL 3.5 04/29/2022   CHOLHDL 4.0 03/16/2020   CHOLHDL 4.1 12/02/2019   No results found for: "LDLDIRECT"    Radiology: DG Chest 2 View  Result Date: 04/28/2022 CLINICAL DATA:  Shortness of breath EXAM: CHEST - 2 VIEW COMPARISON:  Radiograph 11/05/2021 FINDINGS: Unchanged cardiomegaly. There are mild bilateral interstitial opacities. There is a small right pleural effusion and adjacent basilar atelectasis. No pneumothorax. No acute osseous abnormality. IMPRESSION: Mild pulmonary edema and small right pleural effusion with adjacent atelectasis. Unchanged cardiomegaly. Electronically Signed   By: Caprice Renshaw M.D.   On: 04/28/2022 12:50    EKG: Sinus rhythm 72 bpm  ASSESSMENT AND PLAN:   1.  Acute on chronic systolic congestive heart failure, HFrEF, LVEF 25 to 30% 11/06/2021, BNP 2500 on presentation. Switched to lasix gtt 7/4 with  improvement in UOP. 2.  Coronary artery disease, severe three-vessel CAD by cardiac catheterization 03/15/2020, not amenable to percutaneous or surgical revascularization, currently chest pain-free, with unremarkable high-sensitivity troponin 33 and 33.  ECG reveals sinus rhythm without ischemic ST-T wave changes. 3.  Chronic kidney disease stage IIIa, improving with diuresis 4.  Low blood pressure, on midodrine 5.  Medication non-compliance, patient admits to not taking metolazone, and torsemide intermittently d/t inability to sleep at night d/t frequent urination.  Recommendations  1.  Agree with overall current therapy 2.  Continue lasix infusion at 8 mg/hr 3.  S/p metolazone 2.5mg  on 7/6 and 7/7 with good UOP. Give 5 mg metolazone today.  4.  Carefully monitor renal status 5.  Wean midodrine at tolerated, hopefully add low dose metoprolol XL pending clinical course   Signed: Armando Reichert, MD 05/06/2022, 7:59 AM

## 2022-05-06 NOTE — Plan of Care (Signed)
  Problem: Education: Goal: Ability to describe self-care measures that may prevent or decrease complications (Diabetes Survival Skills Education) will improve Outcome: Progressing Goal: Individualized Educational Video(s) Outcome: Progressing   Problem: Fluid Volume: Goal: Ability to maintain a balanced intake and output will improve Outcome: Progressing   

## 2022-05-06 NOTE — Progress Notes (Signed)
PROGRESS NOTE    Aaron Sosa  ZOX:[id number] DOB: 1941/07/28  DOA: 04/28/2022 Date of Service: 05/06/22 PCP: Lauro Regulus, MD     Brief Narrative / Hospital Course:  Aaron Sosa is a 81 y.o. 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  Echocardiogram performed on 11/06/2021 showed ejection fraction of 25 to 30%.  With grade 2 diastolic dysfunction. Severe three-vessel CAD by cardiac catheterization 03/15/2020, not amenable to percutaneous or surgical revascularization, currently chest pain-free 06/30: Chest x-ray showed volume overload, BNP 2501, troponin 33. Admitted for acute on chronic combined systolic diastolic congestive heart failure, started on IV Lasix. 07/02.  Blood pressure is low, gave albumin, discontinue Entresto, add midodrine.  Cardiology consulted 07/04: renal fxn worsening, started lasix gtt 07/05: Cr improving. I&O as below - possible entry error, showing 5000+ mL IN which would be a problem. RN confirms this was entry error. Cardiology increased lasix from 4 to 8 mg/hr 07/06: sodium improving, renal function stable on higher dose lasix gtt, Net IO Since Admission: -3,797.07 mL [05/04/22 0843]. Metolazone 2.5mg  x1 on 7/6 with good UOP, 07/07: continuing lasix gtt 8 mg/h. Cardiology weaning midodrine as tolerated, plan give another dose of metolazone 2.5mg  today. Net IO Since Admission: -6,783.61 mL [05/05/22 1529] 07/08: Sodium slightly low 134, K slightly low 3.4. Net IO Since Admission: -7,905.61 mL [05/06/22 0831]. Looking at I/O trend seems to be slowing down a bit w/ output, appreciate cardiology recs: continue lasix gtt for now, 5 mg metolazone given today, wean midodrine as tolerated and hopefully add low-dose metoprolol XL.    Consultants:  Cardiology   Procedures: None     Subjective: Pt reports no  CP/SOB, still LE swelling.      ASSESSMENT & PLAN:   Principal Problem:   Acute on chronic combined systolic and diastolic congestive heart failure (HCC) Active Problems:   CAD (coronary artery disease)   Elevated troponin   Essential hypertension   Type II diabetes mellitus with renal manifestations (HCC)   Chronic kidney disease, stage 3a (HCC)   HLD (hyperlipidemia)   Hypokalemia   Type 2 diabetes mellitus (HCC)   Constipation   Chronic hypoxemic respiratory failure (HCC)   Hypotension   Acute on chronic combined systolic and diastolic congestive heart failure. Hypotension. Chronic hypoxemic respiratory failure. Elevated troponin secondary to congestive heart failure exacerbation. Patient diuresing is slowing down, renal function slightly worse 07/04 but improved 07/05 and stable 07/06-07/08.   07/04 started furosemide drip. Entresto and beta-blocker was on hold due to hypotension, was on midodrine, then restarted on beta blocker  Net IO Since Admission: -3,332.74 mL [05/03/22 0833] --> -3,797.07 mL [05/04/22 0843]. --> -[id number] mL [05/05/22 1529] -->  -7,905.61 mL [05/06/22 0832] Per cardiology: continue lasix gtt for now, 5 mg metolazone given today, wean midodrine as tolerated and hopefully add low-dose metoprolol XL.   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 Hypokalemia. Renal function getting worse 07/04, started on lasix gtt, better 07/05 and stable 07/06-07/08, will continue to follow.   Essential hypertension. Beta-blocker had been on hold d/t low BP   Chronic constipation - improving/resolved Pt reports BM yesterday     DVT prophylaxis: Lovenox  Code Status: FULL  Family Communication: pt asked if he would like me to call anyone today, he said  no he is in communication w/ his daughter.  Disposition Plan / TOC needs: anticipate back to previous home environment, plan for [facility]  Barriers to discharge /  significant pending items: cardiology following, still requiring diuresis, still SOB. May need to address code status w/ patient and family given EF 20-25 and severe CAD, but stable for now.              Objective: Vitals:   05/05/22 2043 05/06/22 0003 05/06/22 0407 05/06/22 0804  BP: 111/79 104/68 106/77 99/69  Pulse: 87 81 86 79  Resp:  18 20 14   Temp: (!) 97.5 F (36.4 C) (!) 97.4 F (36.3 C) (!) 96.1 F (35.6 C) 98 F (36.7 C)  TempSrc: Oral Oral Oral Oral  SpO2: 96% 95% 97% 95%  Weight:      Height:        Intake/Output Summary (Last 24 hours) at 05/06/2022 0831 Last data filed at 05/06/2022 0408 Gross per 24 hour  Intake 858 ml  Output 1500 ml  Net -642 ml    Filed Weights   05/03/22 0338 05/04/22 0452 05/05/22 0504  Weight: 101.3 kg 101.2 kg 101.1 kg    Examination:  Constitutional:  VS as above General Appearance: alert, well-developed, well-nourished, NAD Eyes: Normal lids and conjunctive, non-icteric sclera Ears, Nose, Mouth, Throat: Normal appearance Neck: No masses, trachea midline Respiratory: Normal respiratory effort Breath sounds normal except slight crackles at bases Cardiovascular: S1/S2 normal, no murmur/rub/gallop auscultated +1-2 lower extremity edema Musculoskeletal:  No clubbing/cyanosis of digits Neurological: No cranial nerve deficit on limited exam Psychiatric: Normal judgment/insight Normal mood and affect       Scheduled Medications:   aspirin EC  81 mg Oral Daily   clopidogrel  75 mg Oral Daily   enoxaparin (LOVENOX) injection  0.5 mg/kg Subcutaneous Q24H   insulin aspart  0-5 Units Subcutaneous QHS   insulin aspart  0-9 Units Subcutaneous TID WC   midodrine  5 mg Oral TID WC   multivitamin-lutein  1 capsule Oral BID   polyethylene glycol  17 g Oral Daily   rosuvastatin  20 mg Oral QHS   senna-docusate  2 tablet Oral BID    Continuous Infusions:  furosemide (LASIX) 200 mg in dextrose 5 % 100 mL (2 mg/mL)  infusion 8 mg/hr (05/06/22 0408)    PRN Medications:  acetaminophen, albuterol, calcium carbonate, dextromethorphan-guaiFENesin, hydrALAZINE, nitroGLYCERIN, ondansetron (ZOFRAN) IV, mouth rinse, simethicone  Antimicrobials:  Anti-infectives (From admission, onward)    None       Data Reviewed: I have personally reviewed following labs and imaging studies  CBC: Recent Labs  Lab 05/02/22 0446 05/03/22 0627 05/04/22 0517 05/05/22 0537  WBC 7.8 6.0 6.2 5.5  HGB 12.6* 11.8* 12.3* 12.1*  HCT 40.1 37.5* 38.8* 38.0*  MCV 90.1 88.2 89.2 88.4  PLT 234 188 199 [id number]    Basic Metabolic Panel: Recent Labs  Lab 04/30/22 0425 05/01/22 0556 05/02/22 0446 05/03/22 0627 05/04/22 0517 05/05/22 0537 05/06/22 0434  NA 134* 134* 131* 132* 134* 135 134*  K 3.8 3.9 4.5 3.7 4.3 3.3* 3.4*  CL 98 98 94* 96* 96* 95* 92*  CO2 30 31 26 28 30 30 29   GLUCOSE 110* 145* 129* 170* 100* 100* 112*  BUN 35* 38* 47* 53* 53* 50* 52*  CREATININE 1.22 1.26* 1.51* 1.22 1.26* 1.10 1.18  CALCIUM 8.1* 8.4* 8.7* 8.4* 8.4* 8.8* 9.1  MG 2.5* 2.5* 2.5* 2.7*  --   --   --  GFR: Estimated Creatinine Clearance: 57.6 mL/min (by C-G formula based on SCr of 1.18 mg/dL). Liver Function Tests: No results for input(s): "AST", "ALT", "ALKPHOS", "BILITOT", "PROT", "ALBUMIN" in the last 168 hours.  No results for input(s): "LIPASE", "AMYLASE" in the last 168 hours. No results for input(s): "AMMONIA" in the last 168 hours. Coagulation Profile: No results for input(s): "INR", "PROTIME" in the last 168 hours. Cardiac Enzymes: No results for input(s): "CKTOTAL", "CKMB", "CKMBINDEX", "TROPONINI" in the last 168 hours. BNP (last 3 results) No results for input(s): "PROBNP" in the last 8760 hours. HbA1C: No results for input(s): "HGBA1C" in the last 72 hours. CBG: Recent Labs  Lab 05/05/22 0757 05/05/22 1153 05/05/22 1641 05/05/22 2130 05/06/22 0805  GLUCAP 108* 187* 208* 223* 171*    Lipid Profile: No  results for input(s): "CHOL", "HDL", "LDLCALC", "TRIG", "CHOLHDL", "LDLDIRECT" in the last 72 hours. Thyroid Function Tests: No results for input(s): "TSH", "T4TOTAL", "FREET4", "T3FREE", "THYROIDAB" in the last 72 hours. Anemia Panel: No results for input(s): "VITAMINB12", "FOLATE", "FERRITIN", "TIBC", "IRON", "RETICCTPCT" in the last 72 hours. Urine analysis:    Component Value Date/Time   COLORURINE YELLOW 03/29/2022 2228   APPEARANCEUR CLEAR 03/29/2022 2228   LABSPEC 1.015 03/29/2022 2228   PHURINE 6.0 03/29/2022 2228   GLUCOSEU >=500 (A) 03/29/2022 2228   HGBUR NEGATIVE 03/29/2022 2228   BILIRUBINUR NEGATIVE 03/29/2022 2228   KETONESUR 5 (A) 03/29/2022 2228   PROTEINUR NEGATIVE 03/29/2022 2228   NITRITE NEGATIVE 03/29/2022 2228   LEUKOCYTESUR NEGATIVE 03/29/2022 2228   Sepsis Labs: @LABRCNTIP (procalcitonin:4,lacticidven:4)  No results found for this or any previous visit (from the past 240 hour(s)).       Radiology Studies last 96 hours: No results found.          LOS: 8 days       , DO [facility] 05/06/2022, 8:31 AM   Staff may message me via secure chat in Epic  but this may not receive immediate response,  please page for urgent matters!  If 7PM-7AM, please contact night-coverage [email or web address]  Dictation software was used to generate the above note. Typos may occur and escape review, as with typed/written notes. Please contact Dr 07/07/2022 directly for clarity if needed.

## 2022-05-07 DIAGNOSIS — I5043 Acute on chronic combined systolic (congestive) and diastolic (congestive) heart failure: Secondary | ICD-10-CM | POA: Diagnosis not present

## 2022-05-07 LAB — GLUCOSE, CAPILLARY
Glucose-Capillary: 157 mg/dL — ABNORMAL HIGH (ref 70–99)
Glucose-Capillary: 221 mg/dL — ABNORMAL HIGH (ref 70–99)
Glucose-Capillary: 203 mg/dL — ABNORMAL HIGH (ref 70–99)
Glucose-Capillary: 187 mg/dL — ABNORMAL HIGH (ref 70–99)

## 2022-05-07 LAB — BASIC METABOLIC PANEL
Anion gap: 13 (ref 5–15)
BUN: 49 mg/dL — ABNORMAL HIGH (ref 8–23)
CO2: 30 mmol/L (ref 22–32)
Calcium: 9 mg/dL (ref 8.9–10.3)
Chloride: 93 mmol/L — ABNORMAL LOW (ref 98–111)
Creatinine, Ser: 1.37 mg/dL — ABNORMAL HIGH (ref 0.61–1.24)
GFR, Estimated: 52 mL/min — ABNORMAL LOW (ref >60)
Glucose, Bld: 151 mg/dL — ABNORMAL HIGH (ref 70–99)
Potassium: 3 mmol/L — ABNORMAL LOW (ref 3.5–5.1)
Sodium: 136 mmol/L (ref 135–145)

## 2022-05-07 MED ORDER — POTASSIUM CHLORIDE 10 MEQ/100ML IV SOLN
10.0000 meq | INTRAVENOUS | Status: AC
  Administered 2022-05-07 (×4): 10 meq via INTRAVENOUS
  Filled 2022-05-07 (×4): qty 100

## 2022-05-07 NOTE — Plan of Care (Signed)
  Problem: Education: Goal: Ability to describe self-care measures that may prevent or decrease complications (Diabetes Survival Skills Education) will improve Outcome: Progressing Goal: Individualized Educational Video(s) Outcome: Progressing   Problem: Coping: Goal: Ability to adjust to condition or change in health will improve Outcome: Progressing   Problem: Health Behavior/Discharge Planning: Goal: Ability to identify and utilize available resources and services will improve Outcome: Progressing Goal: Ability to manage health-related needs will improve Outcome: Progressing   Problem: Tissue Perfusion: Goal: Adequacy of tissue perfusion will improve Outcome: Progressing

## 2022-05-07 NOTE — Progress Notes (Signed)
[facility] Cardiology  CARDIOLOGY CONSULT NOTE  Patient ID: Aaron Sosa MRN: [id number] DOB/AGE: 12-10-1940 81 y.o.  Admit date: 04/28/2022 Referring Physician Roosevelt Locks Primary Physician [facility] Primary Cardiologist Nehemiah Massed Reason for Consultation congestive heart failure  HPI: 81 year old gentleman with a history of HFrEF (EF 25 to 30% with chronic hypotension), CAD (3V disease not amenable to PCI by cath 03/15/2020), CKD who was admitted to the hospital with heart failure exacerbation.  Interval history: - No acute events  - Very poor historian and will not commit to saying how he feels.  - Says he doesn't have significant SOB when he walks to the bathroom.  - Doesn't want to keep legs elevated because as soon as he stops doing that his edema returns.    Review of systems complete and found to be negative unless listed above     Past Medical History:  Diagnosis Date   Arthritis    CHF (congestive heart failure) (HCC)    Diabetes mellitus without complication (HCC)    HOH (hard of hearing)    Lower extremity edema     Past Surgical History:  Procedure Laterality Date   CATARACT EXTRACTION Left    CATARACT EXTRACTION W/PHACO Right 05/06/2015   Procedure: CATARACT EXTRACTION PHACO AND INTRAOCULAR LENS PLACEMENT (IOC);  Surgeon: Lyla Glassing, MD;  Location: [facility] ORS;  Service: Ophthalmology;  Laterality: Right;  [place] 1:14.7         AP   11.2          CDE   8.43    cassette lot [id number]   RETINAL DETACHMENT SURGERY     RIGHT/LEFT HEART CATH AND CORONARY ANGIOGRAPHY N/A 03/17/2020   Procedure: RIGHT/LEFT HEART CATH AND CORONARY ANGIOGRAPHY;  Surgeon: Corey Skains, MD;  Location: [facility];  Service: Cardiovascular;  Laterality: N/A;   WRIST FRACTURE SURGERY  1958    Medications Prior to Admission  Medication Sig Dispense Refill Last Dose   acetaminophen (TYLENOL) 500 MG tablet Take 2 tablets (1,000 mg total) by mouth every 8 (eight) hours as needed for mild pain,  moderate pain or headache. 30 tablet 0 Unknown at PRN   aspirin EC 81 MG EC tablet Take 1 tablet (81 mg total) by mouth daily.   04/28/2022 at 0800   calcium carbonate (TUMS - DOSED IN MG ELEMENTAL CALCIUM) 500 MG chewable tablet Chew 1 tablet (200 mg of elemental calcium total) by mouth 3 (three) times daily as needed for indigestion or heartburn.   Unknown at PRN   clopidogrel (PLAVIX) 75 MG tablet Take 75 mg by mouth daily.   Past Week at Unknown   dapagliflozin propanediol (FARXIGA) 5 MG TABS tablet Take 5 mg by mouth daily.   Past Week at Unknown   docusate sodium (COLACE) 100 MG capsule Take 1 capsule (100 mg total) by mouth 2 (two) times daily as needed for mild constipation or moderate constipation. 10 capsule 0 Unknown at PRN   fluticasone-salmeterol (ADVAIR) 100-50 MCG/ACT AEPB Inhale 1 puff into the lungs 2 (two) times daily.   Unknown at Unknown   glipiZIDE (GLUCOTROL XL) 10 MG 24 hr tablet Take 10 mg by mouth daily.   Past Week at Unknown   magnesium oxide (MAG-OX) 400 MG tablet Take 400 mg by mouth 2 (two) times daily.      metFORMIN (GLUCOPHAGE-XR) 500 MG 24 hr tablet Take 1,000 mg by mouth daily with supper.   Unknown at Unknown   metolazone (ZAROXOLYN) 5 MG tablet Take  5 mg by mouth See admin instructions. Take 1 tablet (5 mg total) by mouth every Tuesday, Thursday, Saturday, and Sunday   Past Week at Unknown   nitroGLYCERIN (NITROSTAT) 0.4 MG SL tablet Place 0.4 mg under the tongue every 5 (five) minutes as needed for chest pain.   Unknown at PRN   [facility] ULTRA test strip Use to check blood sugar up to 2 x daily 50 each 12    polyethylene glycol powder (GLYCOLAX/MIRALAX) 17 GM/SCOOP powder Take 17 g by mouth daily as needed. Take 1 packet (17 g total) by mouth once daily as needed for Constipation Mix in 4-8ounces of fluid prior to taking.   Unknown at PRN   potassium chloride (KLOR-CON M) 10 MEQ tablet Take 10 mEq by mouth daily as needed (supplementation).   Unknown at PRN    rosuvastatin (CRESTOR) 20 MG tablet Take 20 mg by mouth at bedtime.   Past Week at Unknown   senna-docusate (SENOKOT-S) 8.6-50 MG tablet Take 2 tablets by mouth 2 (two) times daily as needed for constipation.   Unknown at PRN   torsemide (DEMADEX) 100 MG tablet Take 100 mg by mouth daily.   Past Week at Unknown   gabapentin (NEURONTIN) 300 MG capsule Take 1 capsule (300 mg total) by mouth 3 (three) times daily. (Patient not taking: Reported on 04/28/2022)   Not Taking   Social History   Socioeconomic History   Marital status: Widowed    Spouse name: Not on file   Number of children: Not on file   Years of education: High School   Highest education level: High school graduate  Occupational History   Not on file  Tobacco Use   Smoking status: Former    Packs/day: 2.00    Years: 5.00    Total pack years: 10.00    Types: Cigarettes   Smokeless tobacco: Former  Building services engineer Use: Never used  Substance and Sexual Activity   Alcohol use: Yes    Alcohol/week: 1.0 standard drink of alcohol    Types: 1 Cans of beer per week   Drug use: Never   Sexual activity: Not on file  Other Topics Concern   Not on file  Social History Narrative   Not on file   Social Determinants of Health   Financial Resource Strain: Not on file  Food Insecurity: Not on file  Transportation Needs: Not on file  Physical Activity: Inactive (07/16/2019)   Exercise Vital Sign    Days of Exercise per Week: 0 days    Minutes of Exercise per Session: 0 min  Stress: Not on file  Social Connections: Not on file  Intimate Partner Violence: Not on file    Family History  Problem Relation Age of Onset   Heart attack Mother    Diabetes Mellitus II Mother    Heart attack Father        PHYSICAL EXAM  General: Elderly Caucasian male sitting upright on edge of bed HEENT:  Normocephalic and atraumatic. Neck:  No JVD.  Lungs: Poor air movement bilaterally without appreciable crackles. Heart: HRRR . Normal  S1 and S2 without gallops or murmurs.  Abdomen: obese appearing Msk:   Normal strength and tone for age. Extremities: 2+ woody edema, chronic venous stasis changes.  Warm to the touch bilaterally Neuro: Alert and oriented X 3. Psych:  Good affect, responds appropriately  Labs:   Lab Results  Component Value Date   WBC 5.5 05/05/2022  HGB 12.1 (L) 05/05/2022   HCT 38.0 (L) 05/05/2022   MCV 88.4 05/05/2022   PLT 186 05/05/2022    Recent Labs  Lab 05/07/22 0439  NA 136  K 3.0*  CL 93*  CO2 30  BUN 49*  CREATININE 1.37*  CALCIUM 9.0  GLUCOSE 151*    No results found for: "CKTOTAL", "CKMB", "CKMBINDEX", "TROPONINI"  Lab Results  Component Value Date   CHOL 153 04/29/2022   CHOL 171 03/16/2020   CHOL 234 (H) 12/02/2019   Lab Results  Component Value Date   HDL 44 04/29/2022   HDL 43 03/16/2020   HDL 57 12/02/2019   Lab Results  Component Value Date   LDLCALC 98 04/29/2022   LDLCALC 103 (H) 03/16/2020   LDLCALC 151 (H) 12/02/2019   Lab Results  Component Value Date   TRIG 57 04/29/2022   TRIG 123 03/16/2020   TRIG 140 12/02/2019   Lab Results  Component Value Date   CHOLHDL 3.5 04/29/2022   CHOLHDL 4.0 03/16/2020   CHOLHDL 4.1 12/02/2019   No results found for: "LDLDIRECT"    Radiology: DG Chest 2 View  Result Date: 04/28/2022 CLINICAL DATA:  Shortness of breath EXAM: CHEST - 2 VIEW COMPARISON:  Radiograph 11/05/2021 FINDINGS: Unchanged cardiomegaly. There are mild bilateral interstitial opacities. There is a small right pleural effusion and adjacent basilar atelectasis. No pneumothorax. No acute osseous abnormality. IMPRESSION: Mild pulmonary edema and small right pleural effusion with adjacent atelectasis. Unchanged cardiomegaly. Electronically Signed   By: Caprice Renshaw M.D.   On: 04/28/2022 12:50    EKG: Sinus rhythm 72 bpm  ASSESSMENT AND PLAN:   1.  Acute on chronic systolic congestive heart failure, HFrEF, LVEF 25 to 30% 11/06/2021, BNP 2500 on  presentation. Switched to lasix gtt 7/4 with improvement in UOP. 2.  Coronary artery disease, severe three-vessel CAD by cardiac catheterization 03/15/2020, not amenable to percutaneous or surgical revascularization, currently chest pain-free, with unremarkable high-sensitivity troponin 33 and 33.  ECG reveals sinus rhythm without ischemic ST-T wave changes. 3.  Chronic kidney disease stage IIIa, improving with diuresis 4.  Low blood pressure, on midodrine 5.  Medication non-compliance, patient admits to not taking metolazone, and torsemide intermittently d/t inability to sleep at night d/t frequent urination.  Recommendations  1.  Agree with overall current therapy 2.  Continue lasix infusion at 8 mg/hr 3.  S/p metolazone 7/6, 7/7, 7/8 with good UOP. Will hold today and follow UOP. 4.  Carefully monitor renal status 5.  Wean midodrine at tolerated, hopefully add low dose metoprolol XL pending clinical course 6. Encouraged him to ambulate more and when resting keep his legs elevated   Signed: Armando Reichert, MD 05/07/2022, 8:15 AM

## 2022-05-07 NOTE — Progress Notes (Signed)
PROGRESS NOTE    Aaron Sosa  PRF:[id number] DOB: 10-Sep-1941  DOA: 04/28/2022 Date of Service: 05/07/22 PCP: Lauro Regulus, MD     Brief Narrative / Hospital Course:  Aaron Sosa is a 81 y.o. 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  Echocardiogram performed on 11/06/2021 showed ejection fraction of 25 to 30%.  With grade 2 diastolic dysfunction. Severe three-vessel CAD by cardiac catheterization 03/15/2020, not amenable to percutaneous or surgical revascularization, currently chest pain-free 06/30: Chest x-ray showed volume overload, BNP 2501, troponin 33. Admitted for acute on chronic combined systolic diastolic congestive heart failure, started on IV Lasix. 07/02.  Blood pressure is low, gave albumin, discontinue Entresto, add midodrine.  Cardiology consulted 07/04: renal fxn worsening, started lasix gtt 07/05: Cr improving. I&O as below - possible entry error, showing 5000+ mL IN which would be a problem. RN confirms this was entry error. Cardiology increased lasix from 4 to 8 mg/hr 07/06: sodium improving, renal function stable on higher dose lasix gtt, Net IO Since Admission: -3,797.07 mL [05/04/22 0843]. Metolazone 2.5mg  x1 on 7/6 with good UOP, 07/07: continuing lasix gtt 8 mg/h. Cardiology weaning midodrine as tolerated, plan give another dose of metolazone 2.5mg  today. Net IO Since Admission: -6,783.61 mL [05/05/22 1529] 07/08: Sodium slightly low 134, K slightly low 3.4. Net IO Since Admission: -7,905.61 mL [05/06/22 0831]. Looking at I/O trend seems to be slowing down a bit w/ output, appreciate cardiology recs: continue lasix gtt for now, 5 mg metolazone given today, wean midodrine as tolerated and hopefully add low-dose metoprolol XL. 07/09: Cr bumped slightly to 1.37, may be able to d/c lasix drip today per cardiology. K  repleted. Net IO Since Admission: -9,450.57 mL [05/07/22 0851]. Cardiology recommends keep on lasix drip and elevate legs. Spoke w/ daughter, she states concern for non-adherence to Rx at home and SOB on minimal exertion.      Consultants:  Cardiology   Procedures: None     Subjective: Pt states frustration he is still in hospital but he would like to get better. Still SOB on minimal exertion but okay at rest.      ASSESSMENT & PLAN:   Principal Problem:   Acute on chronic combined systolic and diastolic congestive heart failure (HCC) Active Problems:   CAD (coronary artery disease)   Elevated troponin   Essential hypertension   Type II diabetes mellitus with renal manifestations (HCC)   Chronic kidney disease, stage 3a (HCC)   HLD (hyperlipidemia)   Hypokalemia   Type 2 diabetes mellitus (HCC)   Constipation   Chronic hypoxemic respiratory failure (HCC)   Hypotension   Acute on chronic combined systolic and diastolic congestive heart failure. Hypotension. Chronic hypoxemic respiratory failure. Elevated troponin secondary to congestive heart failure exacerbation. Patient diuresing is slowing down, renal function slightly worse 07/04 but improved 07/05 and stable 07/06-07/08.   07/04 started furosemide drip. Entresto and beta-blocker was on hold due to hypotension, was on midodrine, then restarted on beta blocker  Net IO Since Admission: -3,332.74 mL [05/03/22 0833] --> -3,797.07 mL [05/04/22 0843]. --> -[id number] mL [05/05/22 1529] -->  -7,905.61 mL [05/06/22 0832] --> -9,450.57 mL [05/07/22 0852] Per cardiology: continue lasix gtt for now but may be able to transition to po soon, wean midodrine as tolerated and hopefully add low-dose metoprolol XL.   Type 2  diabetes with chronic kidney disease stage IIIa. Chronic kidney disease stage IIIa. Hypokalemia. Renal function getting worse 07/04, started on lasix gtt, better 07/05 and stable 07/06-07/08, will continue to  follow. 07/09 Cr bumped up a bit, possibly approaching euvolemia? May consider d/c lasix drip per cardiology and transition to po    Essential hypertension. Beta-blocker had been on hold d/t low BP   Chronic constipation - improving/resolved Pt reports BM yesterday     DVT prophylaxis: Lovenox  Code Status: FULL  Family Communication: d/w daughter at bedside today on rounds.  Disposition Plan / TOC needs: anticipate back to previous home environment, plan for [facility]  Barriers to discharge / significant pending items: cardiology following, still requiring diuresis, still SOB. May need to address code status w/ patient and family given EF 20-25 and severe CAD, but stable for now.              Objective: Vitals:   05/06/22 1608 05/06/22 2007 05/07/22 0317 05/07/22 0804  BP: 100/72 105/77 111/82 106/75  Pulse: 76 84 (!) 105 80  Resp: 15 16 20 18   Temp: 98 F (36.7 C) 97.6 F (36.4 C) 97.8 F (36.6 C) 97.7 F (36.5 C)  TempSrc:  Oral Oral   SpO2: 95% 91% 97% 100%  Weight:      Height:        Intake/Output Summary (Last 24 hours) at 05/07/2022 0850 Last data filed at 05/07/2022 0400 Gross per 24 hour  Intake 335.04 ml  Output 2200 ml  Net -1864.96 ml    Filed Weights   05/04/22 0452 05/05/22 0504 05/06/22 1031  Weight: 101.2 kg 101.1 kg 99.2 kg    Examination:  Constitutional:  VS as above General Appearance: alert, well-developed, well-nourished, NAD Eyes: Normal lids and conjunctive, non-icteric sclera Ears, Nose, Mouth, Throat: Normal appearance Neck: No masses, trachea midline Respiratory: Normal respiratory effort Breath sounds normal except slight crackles at bases Cardiovascular: S1/S2 normal, no murmur/rub/gallop auscultated +1-2 lower extremity edema Musculoskeletal:  No clubbing/cyanosis of digits Neurological: No cranial nerve deficit on limited exam Psychiatric: Normal judgment/insight Normal mood and affect       Scheduled  Medications:   aspirin EC  81 mg Oral Daily   clopidogrel  75 mg Oral Daily   enoxaparin (LOVENOX) injection  0.5 mg/kg Subcutaneous Q24H   insulin aspart  0-5 Units Subcutaneous QHS   insulin aspart  0-9 Units Subcutaneous TID WC   midodrine  5 mg Oral TID WC   multivitamin-lutein  1 capsule Oral BID   polyethylene glycol  17 g Oral Daily   rosuvastatin  20 mg Oral QHS   senna-docusate  2 tablet Oral BID    Continuous Infusions:  furosemide (LASIX) 200 mg in dextrose 5 % 100 mL (2 mg/mL) infusion 8 mg/hr (05/07/22 0400)    PRN Medications:  acetaminophen, albuterol, calcium carbonate, dextromethorphan-guaiFENesin, hydrALAZINE, nitroGLYCERIN, ondansetron (ZOFRAN) IV, mouth rinse, simethicone  Antimicrobials:  Anti-infectives (From admission, onward)    None       Data Reviewed: I have personally reviewed following labs and imaging studies  CBC: Recent Labs  Lab 05/02/22 0446 05/03/22 0627 05/04/22 0517 05/05/22 0537  WBC 7.8 6.0 6.2 5.5  HGB 12.6* 11.8* 12.3* 12.1*  HCT 40.1 37.5* 38.8* 38.0*  MCV 90.1 88.2 89.2 88.4  PLT 234 188 199 [id number]    Basic Metabolic Panel: Recent Labs  Lab 05/01/22 0556 05/02/22 0446 05/03/22 [id number] 05/04/22 0517 05/05/22 0537 05/06/22 0434 05/07/22 0439  NA  134* 131* 132* 134* 135 134* 136  K 3.9 4.5 3.7 4.3 3.3* 3.4* 3.0*  CL 98 94* 96* 96* 95* 92* 93*  CO2 31 26 28 30 30 29 30   GLUCOSE 145* 129* 170* 100* 100* 112* 151*  BUN 38* 47* 53* 53* 50* 52* 49*  CREATININE 1.26* 1.51* 1.22 1.26* 1.10 1.18 1.37*  CALCIUM 8.4* 8.7* 8.4* 8.4* 8.8* 9.1 9.0  MG 2.5* 2.5* 2.7*  --   --   --   --     GFR: Estimated Creatinine Clearance: 49.1 mL/min (A) (by C-G formula based on SCr of 1.37 mg/dL (H)). Liver Function Tests: No results for input(s): "AST", "ALT", "ALKPHOS", "BILITOT", "PROT", "ALBUMIN" in the last 168 hours.  No results for input(s): "LIPASE", "AMYLASE" in the last 168 hours. No results for input(s): "AMMONIA" in the last  168 hours. Coagulation Profile: No results for input(s): "INR", "PROTIME" in the last 168 hours. Cardiac Enzymes: No results for input(s): "CKTOTAL", "CKMB", "CKMBINDEX", "TROPONINI" in the last 168 hours. BNP (last 3 results) No results for input(s): "PROBNP" in the last 8760 hours. HbA1C: No results for input(s): "HGBA1C" in the last 72 hours. CBG: Recent Labs  Lab 05/06/22 0805 05/06/22 1136 05/06/22 1608 05/06/22 2102 05/07/22 0806  GLUCAP 171* 158* 182* 202* 157*    Lipid Profile: No results for input(s): "CHOL", "HDL", "LDLCALC", "TRIG", "CHOLHDL", "LDLDIRECT" in the last 72 hours. Thyroid Function Tests: No results for input(s): "TSH", "T4TOTAL", "FREET4", "T3FREE", "THYROIDAB" in the last 72 hours. Anemia Panel: No results for input(s): "VITAMINB12", "FOLATE", "FERRITIN", "TIBC", "IRON", "RETICCTPCT" in the last 72 hours. Urine analysis:    Component Value Date/Time   COLORURINE YELLOW 03/29/2022 2228   APPEARANCEUR CLEAR 03/29/2022 2228   LABSPEC 1.015 03/29/2022 2228   PHURINE 6.0 03/29/2022 2228   GLUCOSEU >=500 (A) 03/29/2022 2228   HGBUR NEGATIVE 03/29/2022 2228   BILIRUBINUR NEGATIVE 03/29/2022 2228   KETONESUR 5 (A) 03/29/2022 2228   PROTEINUR NEGATIVE 03/29/2022 2228   NITRITE NEGATIVE 03/29/2022 2228   LEUKOCYTESUR NEGATIVE 03/29/2022 2228   Sepsis Labs: @LABRCNTIP (procalcitonin:4,lacticidven:4)  No results found for this or any previous visit (from the past 240 hour(s)).       Radiology Studies last 96 hours: No results found.          LOS: 9 days       2229, DO [facility] 05/07/2022, 8:50 AM   Staff may message me via secure chat in Epic  but this may not receive immediate response,  please page for urgent matters!  If 7PM-7AM, please contact night-coverage [email or web address]  Dictation software was used to generate the above note. Typos may occur and escape review, as with typed/written notes. Please  contact Dr Sunnie Nielsen directly for clarity if needed.

## 2022-05-08 ENCOUNTER — Inpatient Hospital Stay: Payer: Medicare HMO

## 2022-05-08 DIAGNOSIS — I5043 Acute on chronic combined systolic (congestive) and diastolic (congestive) heart failure: Secondary | ICD-10-CM | POA: Diagnosis not present

## 2022-05-08 DIAGNOSIS — Z7189 Other specified counseling: Secondary | ICD-10-CM

## 2022-05-08 LAB — BASIC METABOLIC PANEL
Anion gap: 12 (ref 5–15)
BUN: 53 mg/dL — ABNORMAL HIGH (ref 8–23)
CO2: 32 mmol/L (ref 22–32)
Calcium: 9.2 mg/dL (ref 8.9–10.3)
Chloride: 90 mmol/L — ABNORMAL LOW (ref 98–111)
Creatinine, Ser: 1.61 mg/dL — ABNORMAL HIGH (ref 0.61–1.24)
GFR, Estimated: 43 mL/min — ABNORMAL LOW (ref >60)
Glucose, Bld: 162 mg/dL — ABNORMAL HIGH (ref 70–99)
Potassium: 3.5 mmol/L (ref 3.5–5.1)
Sodium: 134 mmol/L — ABNORMAL LOW (ref 135–145)

## 2022-05-08 LAB — GLUCOSE, CAPILLARY
Glucose-Capillary: 188 mg/dL — ABNORMAL HIGH (ref 70–99)
Glucose-Capillary: 237 mg/dL — ABNORMAL HIGH (ref 70–99)
Glucose-Capillary: 222 mg/dL — ABNORMAL HIGH (ref 70–99)
Glucose-Capillary: 152 mg/dL — ABNORMAL HIGH (ref 70–99)

## 2022-05-08 MED ORDER — TORSEMIDE 20 MG PO TABS
50.0000 mg | ORAL_TABLET | Freq: Every day | ORAL | Status: DC
  Administered 2022-05-08: 50 mg via ORAL
  Filled 2022-05-08 (×2): qty 3

## 2022-05-08 MED ORDER — POTASSIUM CHLORIDE CRYS ER 20 MEQ PO TBCR
20.0000 meq | EXTENDED_RELEASE_TABLET | Freq: Once | ORAL | Status: AC
Start: 2022-05-08 — End: 2022-05-08
  Administered 2022-05-08: 20 meq via ORAL
  Filled 2022-05-08: qty 1

## 2022-05-08 MED ORDER — TORSEMIDE 20 MG PO TABS
20.0000 mg | ORAL_TABLET | Freq: Once | ORAL | Status: AC
Start: 2022-05-08 — End: 2022-05-08
  Administered 2022-05-08: 20 mg via ORAL
  Filled 2022-05-08: qty 1

## 2022-05-08 NOTE — Care Management Important Message (Signed)
Important Message  Patient Details  Name: Aaron Sosa MRN: [id number] Date of Birth: May 18, 1941   Medicare Important Message Given:  Yes     Johnell Comings 05/08/2022, 11:56 AM

## 2022-05-08 NOTE — Progress Notes (Signed)
Physical Therapy Treatment Patient Details Name: Aaron Sosa MRN: [id number] DOB: 09-Jan-1941 Today's Date: 05/08/2022   History of Present Illness Patient is an 81 year old male who presents with shortness of breath for approximately 4 weeks which has worsened in past few days. Patient has orthopnea and constipation. upon admission to ED patient with CHF exacerbation. PMH includes arthritis, CHF, DM, HOH, LE edema, obesity, leukocytosis, myalgia, SOB.    PT Comments    Pt resting in bed upon PT arrival; agreeable to PT session.  During session pt modified independent with bed mobility; SBA with transfers using RW; and SBA ambulating 220 feet with RW use.  Mild SOB noted with ambulation; O2 sats 92% or greater on room air and HR 89-115 bpm during activity.  Pt declined further activity during session d/t fatigue and wanting to rest.  Will continue to focus on strengthening, endurance, and progressive functional mobility during hospitalization.    Recommendations for follow up therapy are one component of a multi-disciplinary discharge planning process, led by the attending physician.  Recommendations may be updated based on patient status, additional functional criteria and insurance authorization.  Follow Up Recommendations  Home health PT     Assistance Recommended at Discharge Intermittent Supervision/Assistance  Patient can return home with the following Assistance with cooking/housework;Assist for transportation;Help with stairs or ramp for entrance   Equipment Recommendations  Rolling walker (2 wheels)    Recommendations for Other Services       Precautions / Restrictions Precautions Precautions: Fall Restrictions Weight Bearing Restrictions: No     Mobility  Bed Mobility Overal bed mobility: Modified Independent Bed Mobility: Supine to Sit     Supine to sit: Modified independent (Device/Increase time)     General bed mobility comments: no difficulties noted     Transfers Overall transfer level: Needs assistance Equipment used: Rolling walker (2 wheels) Transfers: Sit to/from Stand Sit to Stand: Supervision           General transfer comment: x3 trials standing from bed; steady with RW use; appearing fairly strong    Ambulation/Gait Ambulation/Gait assistance: Supervision Gait Distance (Feet): 220 Feet Assistive device: Rolling walker (2 wheels) Gait Pattern/deviations: Step-through pattern Gait velocity: mildly decreased     General Gait Details: steady ambulation with RW use   Stairs             Wheelchair Mobility    Modified Rankin (Stroke Patients Only)       Balance Overall balance assessment: Needs assistance Sitting-balance support: No upper extremity supported, Feet supported Sitting balance-Leahy Scale: Normal Sitting balance - Comments: steady sitting reaching outside BOS   Standing balance support: Bilateral upper extremity supported, During functional activity, Reliant on assistive device for balance Standing balance-Leahy Scale: Good Standing balance comment: steady ambulating with RW use                            Cognition Arousal/Alertness: Awake/alert Behavior During Therapy: WFL for tasks assessed/performed Overall Cognitive Status: Within Functional Limits for tasks assessed                                          Exercises      General Comments  Nursing cleared pt for participation in physical therapy.  Pt agreeable to PT session.      Pertinent Vitals/Pain Pain Assessment  Pain Assessment: No/denies pain    Home Living                          Prior Function            PT Goals (current goals can now be found in the care plan section) Acute Rehab PT Goals Patient Stated Goal: to be able to walk more PT Goal Formulation: With patient Time For Goal Achievement: 05/15/22 Potential to Achieve Goals: Good Progress towards PT goals:  Progressing toward goals    Frequency    Min 2X/week      PT Plan Current plan remains appropriate    Co-evaluation              AM-PAC PT "6 Clicks" Mobility   Outcome Measure  Help needed turning from your back to your side while in a flat bed without using bedrails?: None Help needed moving from lying on your back to sitting on the side of a flat bed without using bedrails?: None Help needed moving to and from a bed to a chair (including a wheelchair)?: A Little Help needed standing up from a chair using your arms (e.g., wheelchair or bedside chair)?: A Little Help needed to walk in hospital room?: A Little Help needed climbing 3-5 steps with a railing? : A Little 6 Click Score: 20    End of Session Equipment Utilized During Treatment: Gait belt Activity Tolerance: Patient tolerated treatment well Patient left: with call bell/phone within reach;with bed alarm set (sitting on edge of bed per pt request) Nurse Communication: Mobility status;Precautions PT Visit Diagnosis: Other abnormalities of gait and mobility (R26.89);Difficulty in walking, not elsewhere classified (R26.2)     Time: 1610-9604 PT Time Calculation (min) (ACUTE ONLY): 10 min  Charges:  $Therapeutic Activity: 8-22 mins                    Hendricks Limes, PT 05/08/22, 9:49 AM

## 2022-05-08 NOTE — Progress Notes (Signed)
[name] Cardiology  CARDIOLOGY CONSULT NOTE  Patient ID: Aaron Sosa MRN: [id number] DOB/AGE: 1941/04/25 81 y.o.  Admit date: 04/28/2022 Referring Physician Roosevelt Locks Primary Physician [facility] Primary Cardiologist Nehemiah Massed Reason for Consultation congestive heart failure  HPI: 81 year old gentleman with a history of HFrEF (EF 25 to 30% with chronic hypotension), CAD (3V disease not amenable to PCI by cath 03/15/2020), CKD who was admitted to the hospital with heart failure exacerbation.  Interval history: - No acute events  - laying flat in bed with legs elevated  - poor historian, but walked yesterday and said he felt okay   Review of systems complete and found to be negative unless listed above     Past Medical History:  Diagnosis Date   Arthritis    CHF (congestive heart failure) ([place])    Diabetes mellitus without complication (HCC)    HOH (hard of hearing)    Lower extremity edema     Past Surgical History:  Procedure Laterality Date   CATARACT EXTRACTION Left    CATARACT EXTRACTION W/PHACO Right 05/06/2015   Procedure: CATARACT EXTRACTION PHACO AND INTRAOCULAR LENS PLACEMENT ([place]);  Surgeon: Lyla Glassing, MD;  Location: [facility] ORS;  Service: Ophthalmology;  Laterality: Right;  [place] 1:14.7         AP   11.2          CDE   8.43    cassette lot [id number]   RETINAL DETACHMENT SURGERY     RIGHT/LEFT HEART CATH AND CORONARY ANGIOGRAPHY N/A 03/17/2020   Procedure: RIGHT/LEFT HEART CATH AND CORONARY ANGIOGRAPHY;  Surgeon: Corey Skains, MD;  Location: [place] CV LAB;  Service: Cardiovascular;  Laterality: N/A;   WRIST FRACTURE SURGERY  1958    Medications Prior to Admission  Medication Sig Dispense Refill Last Dose   acetaminophen (TYLENOL) 500 MG tablet Take 2 tablets (1,000 mg total) by mouth every 8 (eight) hours as needed for mild pain, moderate pain or headache. 30 tablet 0 Unknown at PRN   aspirin EC 81 MG EC tablet Take 1 tablet (81 mg total) by mouth daily.    04/28/2022 at 0800   calcium carbonate (TUMS - DOSED IN MG ELEMENTAL CALCIUM) 500 MG chewable tablet Chew 1 tablet (200 mg of elemental calcium total) by mouth 3 (three) times daily as needed for indigestion or heartburn.   Unknown at PRN   clopidogrel (PLAVIX) 75 MG tablet Take 75 mg by mouth daily.   Past Week at Unknown   dapagliflozin propanediol (FARXIGA) 5 MG TABS tablet Take 5 mg by mouth daily.   Past Week at Unknown   docusate sodium (COLACE) 100 MG capsule Take 1 capsule (100 mg total) by mouth 2 (two) times daily as needed for mild constipation or moderate constipation. 10 capsule 0 Unknown at PRN   fluticasone-salmeterol (ADVAIR) 100-50 MCG/ACT AEPB Inhale 1 puff into the lungs 2 (two) times daily.   Unknown at Unknown   glipiZIDE (GLUCOTROL XL) 10 MG 24 hr tablet Take 10 mg by mouth daily.   Past Week at Unknown   magnesium oxide (MAG-OX) 400 MG tablet Take 400 mg by mouth 2 (two) times daily.      metFORMIN (GLUCOPHAGE-XR) 500 MG 24 hr tablet Take 1,000 mg by mouth daily with supper.   Unknown at Unknown   metolazone (ZAROXOLYN) 5 MG tablet Take 5 mg by mouth See admin instructions. Take 1 tablet (5 mg total) by mouth every Tuesday, Thursday, Saturday, and Sunday   Past Week at Unknown  nitroGLYCERIN (NITROSTAT) 0.4 MG SL tablet Place 0.4 mg under the tongue every 5 (five) minutes as needed for chest pain.   Unknown at PRN   [facility] ULTRA test strip Use to check blood sugar up to 2 x daily 50 each 12    polyethylene glycol powder (GLYCOLAX/MIRALAX) 17 GM/SCOOP powder Take 17 g by mouth daily as needed. Take 1 packet (17 g total) by mouth once daily as needed for Constipation Mix in 4-8ounces of fluid prior to taking.   Unknown at PRN   potassium chloride (KLOR-CON M) 10 MEQ tablet Take 10 mEq by mouth daily as needed (supplementation).   Unknown at PRN   rosuvastatin (CRESTOR) 20 MG tablet Take 20 mg by mouth at bedtime.   Past Week at Unknown   senna-docusate (SENOKOT-S) 8.6-50 MG  tablet Take 2 tablets by mouth 2 (two) times daily as needed for constipation.   Unknown at PRN   torsemide (DEMADEX) 100 MG tablet Take 100 mg by mouth daily.   Past Week at Unknown   gabapentin (NEURONTIN) 300 MG capsule Take 1 capsule (300 mg total) by mouth 3 (three) times daily. (Patient not taking: Reported on 04/28/2022)   Not Taking   Social History   Socioeconomic History   Marital status: Widowed    Spouse name: Not on file   Number of children: Not on file   Years of education: High School   Highest education level: High school graduate  Occupational History   Not on file  Tobacco Use   Smoking status: Former    Packs/day: 2.00    Years: 5.00    Total pack years: 10.00    Types: Cigarettes   Smokeless tobacco: Former  Building services engineer Use: Never used  Substance and Sexual Activity   Alcohol use: Yes    Alcohol/week: 1.0 standard drink of alcohol    Types: 1 Cans of beer per week   Drug use: Never   Sexual activity: Not on file  Other Topics Concern   Not on file  Social History Narrative   Not on file   Social Determinants of Health   Financial Resource Strain: Not on file  Food Insecurity: Not on file  Transportation Needs: Not on file  Physical Activity: Inactive (07/16/2019)   Exercise Vital Sign    Days of Exercise per Week: 0 days    Minutes of Exercise per Session: 0 min  Stress: Not on file  Social Connections: Not on file  Intimate Partner Violence: Not on file    Family History  Problem Relation Age of Onset   Heart attack Mother    Diabetes Mellitus II Mother    Heart attack Father        PHYSICAL EXAM  General: Elderly Caucasian male lying flat in bed with legs elevated HEENT:  Normocephalic and atraumatic. Neck:  No JVD.  Lungs: Normal respiratory effort on room air, poor air movement bilaterally without appreciable crackles. Heart: HRRR . Normal S1 and S2 without gallops or murmurs.  Abdomen: obese appearing Msk:   Normal  strength and tone for age. Extremities: 1+ woody edema, chronic venous stasis changes, appear overall improved with legs elevated.  Warm to the touch bilaterally Neuro: Alert and oriented X 3. Psych:  Good affect, responds appropriately but is a poor historian  Labs:   Lab Results  Component Value Date   WBC 5.5 05/05/2022   HGB 12.1 (L) 05/05/2022   HCT 38.0 (L) 05/05/2022  MCV 88.4 05/05/2022   PLT 186 05/05/2022    Recent Labs  Lab 05/08/22 0214  NA 134*  K 3.5  CL 90*  CO2 32  BUN 53*  CREATININE 1.61*  CALCIUM 9.2  GLUCOSE 162*    No results found for: "CKTOTAL", "CKMB", "CKMBINDEX", "TROPONINI"  Lab Results  Component Value Date   CHOL 153 04/29/2022   CHOL 171 03/16/2020   CHOL 234 (H) 12/02/2019   Lab Results  Component Value Date   HDL 44 04/29/2022   HDL 43 03/16/2020   HDL 57 12/02/2019   Lab Results  Component Value Date   LDLCALC 98 04/29/2022   LDLCALC 103 (H) 03/16/2020   LDLCALC 151 (H) 12/02/2019   Lab Results  Component Value Date   TRIG 57 04/29/2022   TRIG 123 03/16/2020   TRIG 140 12/02/2019   Lab Results  Component Value Date   CHOLHDL 3.5 04/29/2022   CHOLHDL 4.0 03/16/2020   CHOLHDL 4.1 12/02/2019   No results found for: "LDLDIRECT"    Radiology: DG Chest 2 View  Result Date: 04/28/2022 CLINICAL DATA:  Shortness of breath EXAM: CHEST - 2 VIEW COMPARISON:  Radiograph 11/05/2021 FINDINGS: Unchanged cardiomegaly. There are mild bilateral interstitial opacities. There is a small right pleural effusion and adjacent basilar atelectasis. No pneumothorax. No acute osseous abnormality. IMPRESSION: Mild pulmonary edema and small right pleural effusion with adjacent atelectasis. Unchanged cardiomegaly. Electronically Signed   By: Caprice Renshaw M.D.   On: 04/28/2022 12:50    EKG: Sinus rhythm 72 bpm  ASSESSMENT AND PLAN:   1.  Acute on chronic systolic congestive heart failure, HFrEF, LVEF 25 to 30% 11/06/2021, BNP 2500 on presentation.  Switched to lasix gtt 7/4 with improvement in UOP. 2.  Coronary artery disease, severe three-vessel CAD by cardiac catheterization 03/15/2020, not amenable to percutaneous or surgical revascularization, currently chest pain-free, with unremarkable high-sensitivity troponin 33 and 33.  ECG reveals sinus rhythm without ischemic ST-T wave changes. On DAPT.  3.  Chronic kidney disease stage IIIa 4.  Low blood pressure, on midodrine 5.  Medication non-compliance, patient admits to not taking metolazone, and torsemide intermittently d/t inability to sleep at night d/t frequent urination.  Recommendations  1.  Agree with overall current therapy 2.  stop lasix infusion today with bump in Cr.    - give torsemide 50mg  x 1 today (home dose 100mg  daily) 3.  S/p metolazone 7/6, 7/7, 7/8 with good UOP. Will hold today and follow UOP. 4.  Carefully monitor renal status 5.  Wean midodrine at tolerated, hopefully add low dose metoprolol XL pending clinical course 6. Encouraged him to ambulate more and when resting keep his legs elevated 7. May be reaching euvolemia and ready for discharge from a cardiac standpoint in the next day or two  This patient's plan of care was discussed and created with Dr. 9/6 and he is in agreement.    Signed: 9/7, PA-C 05/08/2022, 8:41 AM

## 2022-05-08 NOTE — Progress Notes (Signed)
PROGRESS NOTE    Aaron Sosa  [id number]:[id number] DOB: 1941/08/12  DOA: 04/28/2022 Date of Service: 05/08/22 PCP: Kirk Ruths, MD     Brief Narrative / Hospital Course:  Aaron Sosa is a 81 y.o. 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  Echocardiogram performed on 11/06/2021 showed ejection fraction of 25 to 30%.  With grade 2 diastolic dysfunction. Severe three-vessel CAD by cardiac catheterization 03/15/2020, not amenable to percutaneous or surgical revascularization, currently chest pain-free 06/30: Chest x-ray showed volume overload, BNP 2501, troponin 33. Admitted for acute on chronic combined systolic diastolic congestive heart failure, started on IV Lasix. 07/02.  Blood pressure is low, gave albumin, discontinue Entresto, add midodrine.  Cardiology consulted 07/04: renal fxn worsening, started lasix gtt 07/05: Cr improving. I&O as below - possible entry error, showing 5000+ mL IN which would be a problem. RN confirms this was entry error. Cardiology increased lasix from 4 to 8 mg/hr 07/06: sodium improving, renal function stable on higher dose lasix gtt, Net IO Since Admission: -3,797.07 mL [05/04/22 0843]. Metolazone 2.75m x1 on 7/6 with good UOP, 07/07: continuing lasix gtt 8 mg/h. Cardiology weaning midodrine as tolerated, plan give another dose of metolazone 2.535mtoday. Net IO Since Admission: -6,783.61 mL [05/05/22 1529] 07/08: Sodium slightly low 134, K slightly low 3.4. Net IO Since Admission: -7,905.61 mL [05/06/22 0831]. Looking at I/O trend seems to be slowing down a bit w/ output, appreciate cardiology recs: continue lasix gtt for now, 5 mg metolazone given today, wean midodrine as tolerated and hopefully add low-dose metoprolol XL. 07/09: Cr bumped slightly to 1.37, may be able to d/c lasix drip today per cardiology. K  repleted. Net IO Since Admission: -9,450.57 mL [05/07/22 0851]. Cardiology recommends keep on lasix drip and elevate legs. Spoke w/ daughter, she states concern for non-adherence to Rx at home and SOB on minimal exertion.  07/10: on midodrine and torsemide, off Lasix drip.  Met with Dr. OrAlmond Lintrom cardiology, he believes CHF is end-stage at this point and unlikely to improve beyond what we are seeing now.  If patient tolerates p.o. medications should be able to discharge in the next day or 2.  Discussed with patient CODE STATUS given end-stage CHF.  He would like to speak more with his daughter.  We will call her later today or tomorrow once he has had a chance to chat with her.     Consultants:  Cardiology   Procedures: None     Subjective: Pt states frustration he is still in hospital and would like to go home soon.  No chest pain.  Stable/persistent shortness of breath has not changed much in the past few days he says.     ASSESSMENT & PLAN:   Principal Problem:   Acute on chronic combined systolic and diastolic congestive heart failure (HCC) Active Problems:   CAD (coronary artery disease)   Elevated troponin   Essential hypertension   Type II diabetes mellitus with renal manifestations (HCC)   Chronic kidney disease, stage 3a (HCC)   HLD (hyperlipidemia)   Hypokalemia   Type 2 diabetes mellitus (HCC)   Constipation   Chronic hypoxemic respiratory failure (HCC)   Hypotension   Acute on chronic combined systolic and diastolic congestive heart failure. Hypotension. Chronic hypoxemic respiratory failure. Elevated troponin secondary to congestive heart failure exacerbation.  Patient diuresing is slowing down, renal function slightly worse 07/04 but improved 07/05 and stable 07/06-07/08.   07/04 - 07/09 on furosemide drip. -9,450.57 mL [05/07/22 0852] Entresto and beta-blocker was on hold due to hypotension, was on midodrine, then restarted on beta blocker  Per  cardiology: 05/08/2022 off Lasix and on torsemide.  Wean midodrine as tolerated and hopefully add low-dose metoprolol XL.  CHF is likely end-stage given lack of much improvement with aggressive treatment here and no real benefit from interventions would be likely, risk greater than benefit   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 Hypokalemia. Renal function slightly worse He is off Lasix drip and on torsemide A.m. BMP, if improving may be able to discharge but may need to adjust medications if renal function is worse   Essential hypertension. Beta-blocker had been on hold d/t low BP   Goals of care Discussed with patient today that, based on my conversation with cardiology, his heart failure appears to be at end-stage and not responding to medications, and is not going to be able to safely undergo any procedures which might be helpful.  Heart is likely to get worse over time even despite medication treatments and best efforts.  If he were to experience a cardiac arrest, I do not believe that CPR would save his life, or if we were to achieve return of circulation that it would be unlikely that he would have much quality of life after that.  He states he would like to talk more with his daughter before making any decisions about CODE STATUS and I think this is reasonable.      DVT prophylaxis: Lovenox  Code Status: FULL  Family Communication: Patient would like to speak with daughter himself regarding CODE STATUS/situation, will try to call her later this evening or tomorrow Disposition Plan / TOC needs: anticipate back to previous home environment, plan for [facility]  Barriers to discharge / significant pending items: cardiology following, still requiring diuresis, still SOB. Need to address code status w/ patient and family given EF 20-25 and severe CAD, but stable for now -condition however is likely terminal.  Patient is not amenable to palliative  care discussion but is open to discussion of CODE STATUS             Objective: Vitals:   05/08/22 0344 05/08/22 0815 05/08/22 1218 05/08/22 1617  BP: 101/76 102/70 110/82 105/69  Pulse: 88 85 88 81  Resp: _0 Temp: (!) 97.4 F (36.3 C) 97.6 F (36.4 C) 97.6 F (36.4 C) (!) 97.4 F (36.3 C)  TempSrc:  Oral    SpO2: 96% 94% 100% 98%  Weight:      Height:        Intake/Output Summary (Last 24 hours) at 05/08/2022 1736 Last data filed at 05/08/2022 1434 Gross per 24 hour  Intake 391.45 ml  Output 500 ml  Net -108.55 ml    Filed Weights   05/04/22 0452 05/05/22 0504 05/06/22 1031  Weight: 101.2 kg 101.1 kg 99.2 kg    Examination:  Constitutional:  VS as above General Appearance: alert, well-developed, well-nourished, NAD Eyes: Normal lids and conjunctive, non-icteric sclera Ears, Nose, Mouth, Throat: Normal appearance Neck: No masses, trachea midline Respiratory: Normal respiratory effort Breath sounds normal except slight crackles at bases Cardiovascular: S1/S2 normal, no murmur/rub/gallop auscultated +1-2 lower extremity edema Musculoskeletal:  No clubbing/cyanosis of digits Neurological: No cranial nerve deficit on limited exam  Psychiatric: Normal judgment/insight Normal mood and affect       Scheduled Medications:   aspirin EC  81 mg Oral Daily   clopidogrel  75 mg Oral Daily   enoxaparin (LOVENOX) injection  0.5 mg/kg Subcutaneous Q24H   insulin aspart  0-5 Units Subcutaneous QHS   insulin aspart  0-9 Units Subcutaneous TID WC   midodrine  5 mg Oral TID WC   multivitamin-lutein  1 capsule Oral BID   polyethylene glycol  17 g Oral Daily   rosuvastatin  20 mg Oral QHS   senna-docusate  2 tablet Oral BID   torsemide  50 mg Oral Daily    Continuous Infusions:    PRN Medications:  acetaminophen, albuterol, calcium carbonate, dextromethorphan-guaiFENesin, hydrALAZINE, nitroGLYCERIN, ondansetron (ZOFRAN) IV, mouth  rinse  Antimicrobials:  Anti-infectives (From admission, onward)    None       Data Reviewed: I have personally reviewed following labs and imaging studies  CBC: Recent Labs  Lab 05/02/22 0446 05/03/22 0627 05/04/22 0517 05/05/22 0537  WBC 7.8 6.0 6.2 5.5  HGB 12.6* 11.8* 12.3* 12.1*  HCT 40.1 37.5* 38.8* 38.0*  MCV 90.1 88.2 89.2 88.4  PLT 234 188 199 413    Basic Metabolic Panel: Recent Labs  Lab 05/02/22 0446 05/03/22 0627 05/04/22 0517 05/05/22 0537 05/06/22 0434 05/07/22 0439 05/08/22 0214  NA 131* 132* 134* 135 134* 136 134*  K 4.5 3.7 4.3 3.3* 3.4* 3.0* 3.5  CL 94* 96* 96* 95* 92* 93* 90*  CO2 _0 32  GLUCOSE 129* 170* 100* 100* 112* 151* 162*  BUN 47* 53* 53* 50* 52* 49* 53*  CREATININE 1.51* 1.22 1.26* 1.10 1.18 1.37* 1.61*  CALCIUM 8.7* 8.4* 8.4* 8.8* 9.1 9.0 9.2  MG 2.5* 2.7*  --   --   --   --   --     GFR: Estimated Creatinine Clearance: 41.8 mL/min (A) (by C-G formula based on SCr of 1.61 mg/dL (H)). Liver Function Tests: No results for input(s): "AST", "ALT", "ALKPHOS", "BILITOT", "PROT", "ALBUMIN" in the last 168 hours.  No results for input(s): "LIPASE", "AMYLASE" in the last 168 hours. No results for input(s): "AMMONIA" in the last 168 hours. Coagulation Profile: No results for input(s): "INR", "PROTIME" in the last 168 hours. Cardiac Enzymes: No results for input(s): "CKTOTAL", "CKMB", "CKMBINDEX", "TROPONINI" in the last 168 hours. BNP (last 3 results) No results for input(s): "PROBNP" in the last 8760 hours. HbA1C: No results for input(s): "HGBA1C" in the last 72 hours. CBG: Recent Labs  Lab 05/07/22 1625 05/07/22 2109 05/08/22 0817 05/08/22 1221 05/08/22 1619  GLUCAP 203* 187* 188* 237* 222*    Lipid Profile: No results for input(s): "CHOL", "HDL", "LDLCALC", "TRIG", "CHOLHDL", "LDLDIRECT" in the last 72 hours. Thyroid Function Tests: No results for input(s): "TSH", "T4TOTAL", "FREET4", "T3FREE",  "THYROIDAB" in the last 72 hours. Anemia Panel: No results for input(s): "VITAMINB12", "FOLATE", "FERRITIN", "TIBC", "IRON", "RETICCTPCT" in the last 72 hours. Urine analysis:    Component Value Date/Time   COLORURINE YELLOW 03/29/2022 2228   APPEARANCEUR CLEAR 03/29/2022 2228   LABSPEC 1.015 03/29/2022 2228   PHURINE 6.0 03/29/2022 2228   GLUCOSEU >=500 (A) 03/29/2022 2228   HGBUR NEGATIVE 03/29/2022 2228   BILIRUBINUR NEGATIVE 03/29/2022 2228   KETONESUR 5 (A) 03/29/2022 2228   PROTEINUR NEGATIVE 03/29/2022 2228   NITRITE NEGATIVE 03/29/2022 2228   LEUKOCYTESUR NEGATIVE 03/29/2022 2228   Sepsis Labs: _1 (procalcitonin:4,lacticidven:4)  No results found for this or  any previous visit (from the past 240 hour(s)).       Radiology Studies last 96 hours: No results found.          LOS: 10 days       Emeterio Reeve, DO [facility] 05/08/2022, 5:36 PM   Staff may message me via secure chat in [place]  but this may not receive immediate response,  please page for urgent matters!  If 7PM-7AM, please contact night-coverage [email or web address]  Dictation software was used to generate the above note. Typos may occur and escape review, as with typed/written notes. Please contact Dr Sheppard Coil directly for clarity if needed.

## 2022-05-08 NOTE — Progress Notes (Signed)
OT Cancellation Note  Patient Details Name: EANN CLELAND MRN: [id number] DOB: 23-Oct-1941   Cancelled Treatment:    Reason Eval/Treat Not Completed: Patient declined, no reason specified. OT treatment attempted but pt refusing to participate. He reports, " I am not doing it because they keep setting this bed alarm on me." OT attempted education and encouragement but he continues to refuse. OT will re-attempt at next available time.   Jackquline Denmark, MS, OTR/L , CBIS ascom [phone]  05/08/22, 10:44 AM

## 2022-05-09 DIAGNOSIS — F4325 Adjustment disorder with mixed disturbance of emotions and conduct: Secondary | ICD-10-CM

## 2022-05-09 LAB — GLUCOSE, CAPILLARY
Glucose-Capillary: 138 mg/dL — ABNORMAL HIGH (ref 70–99)
Glucose-Capillary: 144 mg/dL — ABNORMAL HIGH (ref 70–99)
Glucose-Capillary: 335 mg/dL — ABNORMAL HIGH (ref 70–99)

## 2022-05-09 LAB — CBC
HCT: 39.1 % (ref 39.0–52.0)
Hemoglobin: 12.6 g/dL — ABNORMAL LOW (ref 13.0–17.0)
MCH: 28 pg (ref 26.0–34.0)
MCHC: 32.2 g/dL (ref 30.0–36.0)
MCV: 86.9 fL (ref 80.0–100.0)
Platelets: 210 10*3/uL (ref 150–400)
RBC: 4.5 MIL/uL (ref 4.22–5.81)
RDW: 14.6 % (ref 11.5–15.5)
WBC: 7.6 10*3/uL (ref 4.0–10.5)
nRBC: 0 % (ref 0.0–0.2)

## 2022-05-09 LAB — BASIC METABOLIC PANEL
Anion gap: 15 (ref 5–15)
BUN: 58 mg/dL — ABNORMAL HIGH (ref 8–23)
CO2: 30 mmol/L (ref 22–32)
Calcium: 9 mg/dL (ref 8.9–10.3)
Chloride: 91 mmol/L — ABNORMAL LOW (ref 98–111)
Creatinine, Ser: 1.73 mg/dL — ABNORMAL HIGH (ref 0.61–1.24)
GFR, Estimated: 39 mL/min — ABNORMAL LOW (ref >60)
Glucose, Bld: 148 mg/dL — ABNORMAL HIGH (ref 70–99)
Potassium: 3.1 mmol/L — ABNORMAL LOW (ref 3.5–5.1)
Sodium: 136 mmol/L (ref 135–145)

## 2022-05-09 MED ORDER — MIDODRINE HCL 5 MG PO TABS: 5.0000 mg | ORAL_TABLET | Freq: Three times a day (TID) | ORAL | 0 refills | Status: DC

## 2022-05-09 MED ORDER — LORAZEPAM 2 MG/ML IJ SOLN: 1.0000 mg | Freq: Four times a day (QID) | INTRAMUSCULAR | Status: DC | PRN

## 2022-05-09 MED ORDER — ACETAMINOPHEN 325 MG PO TABS: 650.0000 mg | ORAL_TABLET | Freq: Four times a day (QID) | ORAL | Status: DC | PRN

## 2022-05-09 MED ORDER — HALOPERIDOL LACTATE 5 MG/ML IJ SOLN: 1.0000 mg | Freq: Four times a day (QID) | INTRAMUSCULAR | Status: DC | PRN

## 2022-05-09 MED ORDER — POTASSIUM CHLORIDE 20 MEQ PO PACK
40.0000 meq | PACK | Freq: Once | ORAL | Status: DC
  Filled 2022-05-09: qty 2

## 2022-05-09 MED ORDER — TORSEMIDE 10 MG PO TABS: 50.0000 mg | ORAL_TABLET | Freq: Every day | ORAL | 0 refills | Status: DC

## 2022-05-09 NOTE — Progress Notes (Signed)
Patient refusing medications this AM and refusing to replace tele box.  Sats 98% on RA.  Trying to walk patient to obtain ambulatory sat.  Patient refusing at this time.

## 2022-05-09 NOTE — Progress Notes (Signed)
PIV removed. Discharge instructions completed. Patient verbalized understanding of medication regimen, follow up appointments and discharge instructions. Patient belongings gathered and packed to discharge.  

## 2022-05-09 NOTE — Progress Notes (Signed)
   05/09/22 1540  Clinical Encounter Type  Visited With Patient;Family;Health care provider  Visit Type Follow-up;Social support   Luna Fuse offered a brief f/u with pt now that he has come back to room and also has family visiting (sister, others). Pt now received chaplain and actually engaged, responding to comment about a stuffed animal in room and then he shared about a family trip to [place] which was a good memory for him.  While present Minerva Areola, security also checked-in. Spoke briefly to pt's sister; all seemed to be in good spirits. Pt in recliner eating.

## 2022-05-09 NOTE — Progress Notes (Signed)
OT Cancellation Note  Patient Details Name: Aaron Sosa MRN: [id number] DOB: 03-11-1941   Cancelled Treatment:    Reason Eval/Treat Not Completed: Patient declined, no reason specified. Pt found sitting in hallway with RN present. Pt is upset and refusing to return to room. OT unable to redirect pt to assist him back to room at this time. OT will re-attempt when pt is agreeable.    Jackquline Denmark, MS, OTR/L , CBIS ascom [phone]  05/09/22, 11:33 AM

## 2022-05-09 NOTE — Progress Notes (Signed)
   05/09/22 1010  Clinical Encounter Type  Visit Type Initial;Psychological support;Other (Comment)   Chaplain Amiley Shishido noted nursing staff and then security attending pt in hallway between 2A and 2C. Pt refusing to return to room. Chaplain B made attempts to engage pt without making any demands, just to greet him and begin a dialogue. Pt scoffed at role of chaplain and questioned motivations. Some minutes later, another attempt to engage while in the presence of Minerva Areola, security guard, was also working to motivate pt. Pt repeatedly stated concerns about going back to the "doghouse" and voicing suspicions about our intent.  Spiritual care intervention not welcomed at this time.

## 2022-05-09 NOTE — TOC Transition Note (Signed)
Transition of Care [facility]) - CM/SW Discharge Note   Patient Details  Name: Aaron Sosa MRN: [id number] Date of Birth: May 07, 1941  Transition of Care [facility]) CM/SW Contact:  Gildardo Griffes, LCSW Phone Number: 05/09/2022, 3:54 PM   Clinical Narrative:     Patient to dc home today with [facility] PT OT and RN through Dominic Pea with [facility] has been informed of dc today.   No further needs identified.    Final next level of care: Home w Home Health Services Barriers to Discharge: No Barriers Identified   Patient Goals and CMS Choice Patient states their goals for this hospitalization and ongoing recovery are:: to go home CMS Medicare.gov Compare Post Acute Care list provided to:: Patient Choice offered to / list presented to : Patient  Discharge Placement                       Discharge Plan and Services                          HH Arranged: PT, OT, RN [facility] Agency: [facility] Date [facility] Agency Contacted: 05/09/22 Time HH Agency Contacted: [id number] Representative spoke with at [facility] Agency: Elnita Maxwell  Social Determinants of Health (SDOH) Interventions     Readmission Risk Interventions     No data to display

## 2022-05-09 NOTE — Progress Notes (Signed)
[facility] Cardiology  CARDIOLOGY CONSULT NOTE  Patient ID: Aaron Sosa MRN: [id number] DOB/AGE: [birth year] 81 y.o.  Admit date: 04/28/2022 Referring Physician Roosevelt Locks Primary Physician [facility] Primary Cardiologist Nehemiah Massed Reason for Consultation congestive heart failure  HPI: 81 year old gentleman with a history of HFrEF (EF 25 to 30% with chronic hypotension), CAD (3V disease not amenable to PCI by cath 03/15/2020), CKD who was admitted to the hospital with heart failure exacerbation.  Interval history: - seen wandering outside of room between sitting on bench in main hallway between floors 2A and 2C - became paranoid that staff were putting a spell on him and refuses to go back to his room or participate in interview or exam (sitting on the same bench all morning long with staff supervision) - refused medications this morning    Past Medical History:  Diagnosis Date   Arthritis    CHF (congestive heart failure) ([place])    Diabetes mellitus without complication ([place])    HOH (hard of hearing)    Lower extremity edema     Past Surgical History:  Procedure Laterality Date   CATARACT EXTRACTION Left    CATARACT EXTRACTION W/PHACO Right 05/06/2015   Procedure: CATARACT EXTRACTION PHACO AND INTRAOCULAR LENS PLACEMENT ([place]);  Surgeon: Lyla Glassing, MD;  Location: [facility] ORS;  Service: Ophthalmology;  Laterality: Right;  [place] 1:14.7         AP   11.2          CDE   8.43    cassette lot [id number]   RETINAL DETACHMENT SURGERY     RIGHT/LEFT HEART CATH AND CORONARY ANGIOGRAPHY N/A 03/17/2020   Procedure: RIGHT/LEFT HEART CATH AND CORONARY ANGIOGRAPHY;  Surgeon: Corey Skains, MD;  Location: [place] CV LAB;  Service: Cardiovascular;  Laterality: N/A;   WRIST FRACTURE SURGERY  1958    Medications Prior to Admission  Medication Sig Dispense Refill Last Dose   acetaminophen (TYLENOL) 500 MG tablet Take 2 tablets (1,000 mg total) by mouth every 8 (eight) hours as needed for mild pain, moderate  pain or headache. 30 tablet 0 Unknown at PRN   aspirin EC 81 MG EC tablet Take 1 tablet (81 mg total) by mouth daily.   04/28/2022 at 0800   calcium carbonate (TUMS - DOSED IN MG ELEMENTAL CALCIUM) 500 MG chewable tablet Chew 1 tablet (200 mg of elemental calcium total) by mouth 3 (three) times daily as needed for indigestion or heartburn.   Unknown at PRN   clopidogrel (PLAVIX) 75 MG tablet Take 75 mg by mouth daily.   Past Week at Unknown   dapagliflozin propanediol (FARXIGA) 5 MG TABS tablet Take 5 mg by mouth daily.   Past Week at Unknown   docusate sodium (COLACE) 100 MG capsule Take 1 capsule (100 mg total) by mouth 2 (two) times daily as needed for mild constipation or moderate constipation. 10 capsule 0 Unknown at PRN   fluticasone-salmeterol (ADVAIR) 100-50 MCG/ACT AEPB Inhale 1 puff into the lungs 2 (two) times daily.   Unknown at Unknown   glipiZIDE (GLUCOTROL XL) 10 MG 24 hr tablet Take 10 mg by mouth daily.   Past Week at Unknown   magnesium oxide (MAG-OX) 400 MG tablet Take 400 mg by mouth 2 (two) times daily.      metFORMIN (GLUCOPHAGE-XR) 500 MG 24 hr tablet Take 1,000 mg by mouth daily with supper.   Unknown at Unknown   metolazone (ZAROXOLYN) 5 MG tablet Take 5 mg by mouth See admin instructions. Take  1 tablet (5 mg total) by mouth every Tuesday, Thursday, Saturday, and Sunday   Past Week at Unknown   nitroGLYCERIN (NITROSTAT) 0.4 MG SL tablet Place 0.4 mg under the tongue every 5 (five) minutes as needed for chest pain.   Unknown at PRN   [facility] ULTRA test strip Use to check blood sugar up to 2 x daily 50 each 12    polyethylene glycol powder (GLYCOLAX/MIRALAX) 17 GM/SCOOP powder Take 17 g by mouth daily as needed. Take 1 packet (17 g total) by mouth once daily as needed for Constipation Mix in 4-8ounces of fluid prior to taking.   Unknown at PRN   potassium chloride (KLOR-CON M) 10 MEQ tablet Take 10 mEq by mouth daily as needed (supplementation).   Unknown at PRN   rosuvastatin  (CRESTOR) 20 MG tablet Take 20 mg by mouth at bedtime.   Past Week at Unknown   senna-docusate (SENOKOT-S) 8.6-50 MG tablet Take 2 tablets by mouth 2 (two) times daily as needed for constipation.   Unknown at PRN   torsemide (DEMADEX) 100 MG tablet Take 100 mg by mouth daily.   Past Week at Unknown   gabapentin (NEURONTIN) 300 MG capsule Take 1 capsule (300 mg total) by mouth 3 (three) times daily. (Patient not taking: Reported on 04/28/2022)   Not Taking   Social History   Socioeconomic History   Marital status: Widowed    Spouse name: Not on file   Number of children: Not on file   Years of education: High School   Highest education level: High school graduate  Occupational History   Not on file  Tobacco Use   Smoking status: Former    Packs/day: 2.00    Years: 5.00    Total pack years: 10.00    Types: Cigarettes   Smokeless tobacco: Former  Building services engineer Use: Never used  Substance and Sexual Activity   Alcohol use: Yes    Alcohol/week: 1.0 standard drink of alcohol    Types: 1 Cans of beer per week   Drug use: Never   Sexual activity: Not on file  Other Topics Concern   Not on file  Social History Narrative   Not on file   Social Determinants of Health   Financial Resource Strain: Not on file  Food Insecurity: Not on file  Transportation Needs: Not on file  Physical Activity: Inactive (07/16/2019)   Exercise Vital Sign    Days of Exercise per Week: 0 days    Minutes of Exercise per Session: 0 min  Stress: Not on file  Social Connections: Not on file  Intimate Partner Violence: Not on file    Family History  Problem Relation Age of Onset   Heart attack Mother    Diabetes Mellitus II Mother    Heart attack Father        PHYSICAL EXAM  General: Elderly Caucasian male sitting in hospital hallway between 2A and 2C with nurse beside him  HEENT:  Normocephalic and atraumatic. Neck:  No JVD.  Lungs: Normal respiratory effort on room air Heart: unable to  assess due to patient refusal  Abdomen: obese appearing Msk:   Normal strength and tone for age. Extremities: appears to have persistent woody edema, chronic venous stasis changes Neuro: Alert and conversant  Psych: says he thinks we (staff) are putting a spell on him, judgment and insight poor  Labs:   Lab Results  Component Value Date   WBC 7.6 05/09/2022  HGB 12.6 (L) 05/09/2022   HCT 39.1 05/09/2022   MCV 86.9 05/09/2022   PLT 210 05/09/2022    Recent Labs  Lab 05/09/22 0334  NA 136  K 3.1*  CL 91*  CO2 30  BUN 58*  CREATININE 1.73*  CALCIUM 9.0  GLUCOSE 148*    No results found for: "CKTOTAL", "CKMB", "CKMBINDEX", "TROPONINI"  Lab Results  Component Value Date   CHOL 153 04/29/2022   CHOL 171 03/16/2020   CHOL 234 (H) 12/02/2019   Lab Results  Component Value Date   HDL 44 04/29/2022   HDL 43 03/16/2020   HDL 57 12/02/2019   Lab Results  Component Value Date   LDLCALC 98 04/29/2022   LDLCALC 103 (H) 03/16/2020   LDLCALC 151 (H) 12/02/2019   Lab Results  Component Value Date   TRIG 57 04/29/2022   TRIG 123 03/16/2020   TRIG 140 12/02/2019   Lab Results  Component Value Date   CHOLHDL 3.5 04/29/2022   CHOLHDL 4.0 03/16/2020   CHOLHDL 4.1 12/02/2019   No results found for: "LDLDIRECT"    Radiology: [facility] Chest Port 1 View  Result Date: 05/08/2022 CLINICAL DATA:  Dyspnea EXAM: PORTABLE CHEST 1 VIEW COMPARISON:  04/28/2022 chest radiograph. FINDINGS: Stable cardiomediastinal silhouette with mild cardiomegaly. No pneumothorax. No pleural effusion. Slightly low lung volumes. New mild diffuse prominence of the parahilar interstitial markings. Mild bibasilar atelectasis, similar. IMPRESSION: 1. Mild cardiomegaly with new mild diffuse prominence of the parahilar interstitial markings, suggesting mild congestive heart failure. 2. Stable mild bibasilar atelectasis. Electronically Signed   By: Ilona Sorrel M.D.   On: 05/08/2022 18:35   DG Chest 2  View  Result Date: 04/28/2022 CLINICAL DATA:  Shortness of breath EXAM: CHEST - 2 VIEW COMPARISON:  Radiograph 11/05/2021 FINDINGS: Unchanged cardiomegaly. There are mild bilateral interstitial opacities. There is a small right pleural effusion and adjacent basilar atelectasis. No pneumothorax. No acute osseous abnormality. IMPRESSION: Mild pulmonary edema and small right pleural effusion with adjacent atelectasis. Unchanged cardiomegaly. Electronically Signed   By: Maurine Simmering M.D.   On: 04/28/2022 12:50    EKG: Sinus rhythm 72 bpm  ASSESSMENT AND PLAN:   1.  Acute on chronic systolic congestive heart failure, HFrEF, LVEF 25 to 30% 11/06/2021, BNP 2500 on presentation. Switched to lasix gtt 7/4 with improvement in UOP. 2.  Coronary artery disease, severe three-vessel CAD by cardiac catheterization 03/15/2020, not amenable to percutaneous or surgical revascularization, currently chest pain-free, with unremarkable high-sensitivity troponin 33 and 33.  ECG reveals sinus rhythm without ischemic ST-T wave changes. On DAPT.  3.  Chronic kidney disease stage IIIa 4.  Low blood pressure, on midodrine 5.  Medication non-compliance, patient admits to not taking metolazone, and torsemide intermittently d/t inability to sleep at night d/t frequent urination.  Recommendations  1.  Agree with overall current therapy 2.  S/p lasix infusion from 7/4 - 7/10, s/p torsemide 50mg  x 1 on 7/10 with minimal UOP (home dose 100mg  daily) 3.  S/p metolazone 7/6, 7/7, 7/8 with good UOP.  4.  Carefully monitor renal status 5.  Wean midodrine at tolerated, unable to add metoprolol XL due to low BP  6.  At this point, he is unfortunately refusing further medical care and renal function remains poor. Wants to go back home. Would recommend resuming his home diuretics and outpatient follow up with Dr. Nehemiah Massed in 1 week and referral to advanced heart failure clinic.   This patient's plan of care was  discussed and created with  Dr. Gwen Pounds and he is in agreement.    Signed: Rebeca Allegra, PA-C 05/09/2022, 11:03 AM

## 2022-05-09 NOTE — Discharge Summary (Signed)
Physician Discharge Summary   Patient: Aaron Sosa MRN: [id number]  DOB: 1941-09-05   Admit:     Date of Admission: 04/28/2022 Admitted from: home   Discharge: Date of discharge: 05/09/22 Disposition: Home health Condition at discharge: fair  CODE STATUS: DNR     Discharge Physician: Emeterio Reeve, DO [facility]     PCP: Kirk Ruths, MD  Recommendations for Outpatient Follow-up:  Follow up with PCP Kirk Ruths, MD in 1 weeks Please obtain labs/tests: CBC, BMP in 1 week Please follow up on the following pending results: none Please consider completing advanced directive. I have signed a DNR form based on my discussion w/ patient and daughter.    Discharge Instructions     (HEART FAILURE PATIENTS) Call MD:  Anytime you have any of the following symptoms: 1) 3 pound weight gain in 24 hours or 5 pounds in 1 week 2) shortness of breath, with or without a dry hacking cough 3) swelling in the hands, feet or stomach 4) if you have to sleep on extra pillows at night in order to breathe.   Complete by: As directed    AMB referral to CHF clinic   Complete by: As directed    Diet - low sodium heart healthy   Complete by: As directed    Increase activity slowly   Complete by: As directed    No wound care   Complete by: As directed       Hospital Course:  Aaron Sosa is a 81 y.o. 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  Echocardiogram performed on 11/06/2021 showed ejection fraction of 25 to 30%.  With grade 2 diastolic dysfunction. Severe three-vessel CAD by cardiac catheterization 03/15/2020, not amenable to percutaneous or surgical revascularization, currently chest pain-free 06/30: Chest x-ray showed volume overload, BNP 2501, troponin 33. Admitted for acute on chronic combined systolic  diastolic congestive heart failure, started on IV Lasix. 07/02.  Blood pressure is low, gave albumin, discontinue Entresto, add midodrine.  Cardiology consulted 07/04: renal fxn worsening, started lasix gtt 07/05: Cr improving. Cardiology increased lasix from 4 to 8 mg/hr 07/06: sodium improving, renal function stable on higher dose lasix gtt, Metolazone 2.9m x1  07/07: continuing lasix gtt 8 mg/h. Cardiology weaning midodrine as tolerated, plan give another dose of metolazone 2.564mtoday.  07/08: Sodium slightly low 134, K slightly low 3.4. Continue lasix gtt for now, 5 mg metolazone given today, wean midodrine as tolerated and hopefully add low-dose metoprolol XL. 07/09: Cr bumped slightly to 1.37, may be able to d/c lasix drip today per cardiology. K repleted. Cardiology recommends keep on lasix drip and elevate legs.  07/10: on midodrine and torsemide, off Lasix drip.  Met with Dr. OrCorky Soxrom cardiology, he believes CHF is end-stage at this point and unlikely to improve beyond what we are seeing now.  If patient tolerates p.o. medications should be able to discharge in the next day or so.  Discussed with patient CODE STATUS given end-stage CHF.  He would like to speak more with his daughter but indicated he would probably not want extraordinary measures. 07/11: Called to assess patient, he had been refusing lab draws, vital signs.  Was found in hallway significantly agitated sitting on a bench outside the ward. Patient was apparently upset by something cardiology team  said.   I had some concern for his mental health and decision-making abilities, see progress note from earlier today.  Psychiatry evaluated the patient and diagnosed with admit disorder with mixed disturbance of emotions and conduct, no imminent risk to self/others, no need for inpatient psychiatric admission or indication for psychiatric medication at this time and supports discharge home with daughter if medically stable.  I reevaluated  the patient after psychiatry had spoke with him and he seemed much more calm, was able to be convinced to go back to his room.  I spoke at length with his daughter over the phone about the situation, about his overall medical prognosis in light of likely end-stage CHF and that he and I had discussed CODE STATUS last night.  She confirms that he has previously stated that he would not want extraordinary measures and requests DNR status. I confirmed this w/ the patient prior to discharge and signed DNR was given to him/family and signed DNR was given to unit secretary to scan into [facility]      Discharge Diagnoses: Principal Problem:   Adjustment disorder with mixed disturbance of emotions and conduct Active Problems:   Acute on chronic combined systolic and diastolic congestive heart failure (HCC)   CAD (coronary artery disease)   Elevated troponin   Essential hypertension   Type II diabetes mellitus with renal manifestations (HCC)   Chronic kidney disease, stage 3a ([place])   HLD (hyperlipidemia)   Hypokalemia   Type 2 diabetes mellitus (HCC)   Constipation   Chronic hypoxemic respiratory failure (HCC)   Hypotension   Advance care planning    Assessment & Plan:     Agitation, no delusion/paranoia Psychiatry assessment: Adjustment disorder with mixed disturbance of emotions and conduct See notes from earlier today and psychiatry consult note He is psychiatrically cleared for discharge    Acute on chronic combined systolic and diastolic congestive heart failure. Hypotension. Chronic hypoxemic respiratory failure. Elevated troponin secondary to congestive heart failure exacerbation. Patient has been diuresing is slowing down, renal function slightly worse 07/04 but improved 07/05 and stable since Entresto and beta-blocker on hold due to hypotension Started and maintained on midodrine Per cardiology: Would recommend resuming his home diuretics and outpatient follow up with Dr. Nehemiah Massed  in 1 week and referral to advanced heart failure clinic.    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 Hypokalemia. Renal function slightly worse possible new baseline He is off Lasix drip and on torsemide Declining labs   Essential hypertension - low BP in hospital  Beta-blocker had been on hold d/t low BP   Goals of care Discussed with patient 05/08/2022 that, based on my conversation with cardiology, his heart failure appears to be at end-stage and not responding to medications, and is not going to be able to safely undergo any procedures which might be helpful.  Heart is likely to get worse over time even despite medication treatments and best efforts.  If he were to experience a cardiac arrest, I do not believe that CPR would save his life, or if we were to achieve return of circulation that it would be unlikely that he would have much quality of life after that.  He states he would like to talk more with his daughter before making any decisions about CODE STATUS and I think this is reasonable.  See above for events 05/09/2022, will confirm DNR status with patient before placing order.  Discharge Instructions  Allergies as of 05/09/2022       Reactions   Empagliflozin Other (See Comments)   Prednisone Other (See Comments), Swelling   Fluid build up    Shrimp Extract Allergy Skin Test Anaphylaxis   Atorvastatin Other (See Comments)   myalgia Other reaction(s): Unknown myalgia   Codeine    Other reaction(s): Unknown Makes him feel crazy   Dust Mite Extract    Shrimp [shellfish Allergy]         Medication List     STOP taking these medications    fluticasone-salmeterol 100-50 MCG/ACT Aepb Commonly known as: ADVAIR   gabapentin 300 MG capsule Commonly known as: NEURONTIN   metolazone 5 MG tablet Commonly known as: ZAROXOLYN   potassium chloride 10 MEQ tablet Commonly known as: KLOR-CON M       TAKE  these medications    acetaminophen 325 MG tablet Commonly known as: TYLENOL Take 2 tablets (650 mg total) by mouth every 6 (six) hours as needed for mild pain or fever. What changed:  medication strength how much to take when to take this reasons to take this   aspirin EC 81 MG tablet Take 1 tablet (81 mg total) by mouth daily.   calcium carbonate 500 MG chewable tablet Commonly known as: TUMS - dosed in mg elemental calcium Chew 1 tablet (200 mg of elemental calcium total) by mouth 3 (three) times daily as needed for indigestion or heartburn.   clopidogrel 75 MG tablet Commonly known as: PLAVIX Take 75 mg by mouth daily.   dapagliflozin propanediol 5 MG Tabs tablet Commonly known as: FARXIGA Take 5 mg by mouth daily.   docusate sodium 100 MG capsule Commonly known as: COLACE Take 1 capsule (100 mg total) by mouth 2 (two) times daily as needed for mild constipation or moderate constipation.   glipiZIDE 10 MG 24 hr tablet Commonly known as: GLUCOTROL XL Take 10 mg by mouth daily.   magnesium oxide 400 MG tablet Commonly known as: MAG-OX Take 400 mg by mouth 2 (two) times daily.   metFORMIN 500 MG 24 hr tablet Commonly known as: GLUCOPHAGE-XR Take 1,000 mg by mouth daily with supper.   midodrine 5 MG tablet Commonly known as: PROAMATINE Take 1 tablet (5 mg total) by mouth 3 (three) times daily with meals.   nitroGLYCERIN 0.4 MG SL tablet Commonly known as: NITROSTAT Place 0.4 mg under the tongue every 5 (five) minutes as needed for chest pain.   OneTouch Ultra test strip Generic drug: glucose blood Use to check blood sugar up to 2 x daily   polyethylene glycol powder 17 GM/SCOOP powder Commonly known as: GLYCOLAX/MIRALAX Take 17 g by mouth daily as needed. Take 1 packet (17 g total) by mouth once daily as needed for Constipation Mix in 4-8ounces of fluid prior to taking.   rosuvastatin 20 MG tablet Commonly known as: CRESTOR Take 20 mg by mouth at bedtime.    senna-docusate 8.6-50 MG tablet Commonly known as: Senokot-S Take 2 tablets by mouth 2 (two) times daily as needed for constipation.   torsemide 10 MG tablet Commonly known as: DEMADEX Take 5 tablets (50 mg total) by mouth daily. Start taking on: May 10, 2022 What changed:  medication strength how much to take         Follow-up Information     Corey Skains, MD. Go in 1 week(s).   Specialty: Cardiology Why: Appointment on Wednesday 05/24/2022 at 10:45pm Contact information: [place]  [facility] [place] [address] [phone]                 Allergies  Allergen Reactions   Empagliflozin Other (See Comments)   Prednisone Other (See Comments) and Swelling    Fluid build up    Shrimp Extract Allergy Skin Test Anaphylaxis   Atorvastatin Other (See Comments)    myalgia Other reaction(s): Unknown myalgia   Codeine     Other reaction(s): Unknown Makes him feel crazy   Dust Mite Extract    Shrimp [Shellfish Allergy]      Subjective: seen this evening 5pm, doing well, no concerns, wants to go home. Family present, questions answered.    Discharge Exam: Vitals:   05/09/22 0831 05/09/22 1241  BP:  114/79  Pulse: 78 84  Resp:  16  Temp:  98.1 F (36.7 C)  SpO2: 98% 96%   General: Pt is alert, awake, not in acute distress Cardiovascular: RRR, S1/S2 , no rubs, no gallops Respiratory: CTA bilaterally, no wheezing, no rhonchi Abdominal: Soft, NT, ND, bowel sounds + Extremities: no edema, no cyanosis     The results of significant diagnostics from this hospitalization (including imaging, microbiology, ancillary and laboratory) are listed below for reference.     Microbiology: No results found for this or any previous visit (from the past 240 hour(s)).   Labs: BNP (last 3 results) Recent Labs    11/05/21 2236 04/28/22 1203  BNP 1,350.4* 8,466.5*   Basic Metabolic Panel: Recent Labs  Lab 05/03/22 0627  05/04/22 0517 05/05/22 0537 05/06/22 0434 05/07/22 0439 05/08/22 0214 05/09/22 0334  NA 132*   < > 135 134* 136 134* 136  K 3.7   < > 3.3* 3.4* 3.0* 3.5 3.1*  CL 96*   < > 95* 92* 93* 90* 91*  CO2 28   < > _0 32 30  GLUCOSE 170*   < > 100* 112* 151* 162* 148*  BUN 53*   < > 50* 52* 49* 53* 58*  CREATININE 1.22   < > 1.10 1.18 1.37* 1.61* 1.73*  CALCIUM 8.4*   < > 8.8* 9.1 9.0 9.2 9.0  MG 2.7*  --   --   --   --   --   --    < > = values in this interval not displayed.   Liver Function Tests: No results for input(s): "AST", "ALT", "ALKPHOS", "BILITOT", "PROT", "ALBUMIN" in the last 168 hours. No results for input(s): "LIPASE", "AMYLASE" in the last 168 hours. No results for input(s): "AMMONIA" in the last 168 hours. CBC: Recent Labs  Lab 05/03/22 0627 05/04/22 0517 05/05/22 0537 05/09/22 0334  WBC 6.0 6.2 5.5 7.6  HGB 11.8* 12.3* 12.1* 12.6*  HCT 37.5* 38.8* 38.0* 39.1  MCV 88.2 89.2 88.4 86.9  PLT 188 199 186 210   Cardiac Enzymes: No results for input(s): "CKTOTAL", "CKMB", "CKMBINDEX", "TROPONINI" in the last 168 hours. BNP: Invalid input(s): "POCBNP" CBG: Recent Labs  Lab 05/08/22 1221 05/08/22 1619 05/08/22 2117 05/09/22 0743 05/09/22 1242  GLUCAP 237* 222* 152* 138* 144*   D-Dimer No results for input(s): "DDIMER" in the last 72 hours. Hgb A1c No results for input(s): "HGBA1C" in the last 72 hours. Lipid Profile No results for input(s): "CHOL", "HDL", "LDLCALC", "TRIG", "CHOLHDL", "LDLDIRECT" in the last 72 hours. Thyroid function studies No results for input(s): "TSH", "T4TOTAL", "T3FREE", "THYROIDAB" in the last 72 hours.  Invalid input(s): "FREET3" Anemia work up No results for  input(s): "VITAMINB12", "FOLATE", "FERRITIN", "TIBC", "IRON", "RETICCTPCT" in the last 72 hours. Urinalysis    Component Value Date/Time   COLORURINE YELLOW 03/29/2022 2228   APPEARANCEUR CLEAR 03/29/2022 2228   LABSPEC 1.015 03/29/2022 2228   PHURINE 6.0  03/29/2022 2228   GLUCOSEU >=500 (A) 03/29/2022 2228   HGBUR NEGATIVE 03/29/2022 2228   BILIRUBINUR NEGATIVE 03/29/2022 2228   KETONESUR 5 (A) 03/29/2022 2228   PROTEINUR NEGATIVE 03/29/2022 2228   NITRITE NEGATIVE 03/29/2022 2228   LEUKOCYTESUR NEGATIVE 03/29/2022 2228   Sepsis Labs Recent Labs  Lab 05/03/22 0627 05/04/22 0517 05/05/22 0537 05/09/22 0334  WBC 6.0 6.2 5.5 7.6   Microbiology No results found for this or any previous visit (from the past 240 hour(s)). Imaging DG Chest 2 View  Result Date: 04/28/2022 CLINICAL DATA:  Shortness of breath EXAM: CHEST - 2 VIEW COMPARISON:  Radiograph 11/05/2021 FINDINGS: Unchanged cardiomegaly. There are mild bilateral interstitial opacities. There is a small right pleural effusion and adjacent basilar atelectasis. No pneumothorax. No acute osseous abnormality. IMPRESSION: Mild pulmonary edema and small right pleural effusion with adjacent atelectasis. Unchanged cardiomegaly. Electronically Signed   By: Maurine Simmering M.D.   On: 04/28/2022 12:50      Time coordinating discharge: Over 30 minutes  SIGNED:  Emeterio Reeve DO [facility]

## 2022-05-09 NOTE — Progress Notes (Signed)
PROGRESS NOTE    KARDER GOODIN  BLT:[id number] DOB: May 05, 1941  DOA: 04/28/2022 Date of Service: 05/09/22 PCP: Kirk Ruths, MD     Brief Narrative / Hospital Course:  Aaron Sosa is a 81 y.o. 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  Echocardiogram performed on 11/06/2021 showed ejection fraction of 25 to 30%.  With grade 2 diastolic dysfunction. Severe three-vessel CAD by cardiac catheterization 03/15/2020, not amenable to percutaneous or surgical revascularization, currently chest pain-free 06/30: Chest x-ray showed volume overload, BNP 2501, troponin 33. Admitted for acute on chronic combined systolic diastolic congestive heart failure, started on IV Lasix. 07/02.  Blood pressure is low, gave albumin, discontinue Entresto, add midodrine.  Cardiology consulted 07/04: renal fxn worsening, started lasix gtt 07/05: Cr improving. I&O as below - possible entry error, showing 5000+ mL IN which would be a problem. RN confirms this was entry error. Cardiology increased lasix from 4 to 8 mg/hr 07/06: sodium improving, renal function stable on higher dose lasix gtt, Net IO Since Admission: -3,797.07 mL [05/04/22 0843]. Metolazone 2.5m x1 on 7/6 with good UOP, 07/07: continuing lasix gtt 8 mg/h. Cardiology weaning midodrine as tolerated, plan give another dose of metolazone 2.584mtoday. Net IO Since Admission: -6,783.61 mL [05/05/22 1529] 07/08: Sodium slightly low 134, K slightly low 3.4. Net IO Since Admission: -7,905.61 mL [05/06/22 0831]. Looking at I/O trend seems to be slowing down a bit w/ output, appreciate cardiology recs: continue lasix gtt for now, 5 mg metolazone given today, wean midodrine as tolerated and hopefully add low-dose metoprolol XL. 07/09: Cr bumped slightly to 1.37, may be able to d/c lasix drip today per cardiology. K  repleted. Net IO Since Admission: -9,450.57 mL [05/07/22 0851]. Cardiology recommends keep on lasix drip and elevate legs. Spoke w/ daughter, she states concern for non-adherence to Rx at home and SOB on minimal exertion.  07/10: on midodrine and torsemide, off Lasix drip.  Met with Dr. OrAlmond Lintrom cardiology, he believes CHF is end-stage at this point and unlikely to improve beyond what we are seeing now.  If patient tolerates p.o. medications should be able to discharge in the next day or 2.  Discussed with patient CODE STATUS given end-stage CHF.  He would like to speak more with his daughter.  We will call her later today or tomorrow once he has had a chance to chat with her. 07/11: Called to assess patient, he had been refusing lab draws, vital signs.  He wandered out of his room and is sitting on a bench in between floors 2A and 2C.  When I came to assess him, he had his shirt over his head/face, he was he was refusing to remove this, but was intermittently taking it down.  He said he did not want to go back to his room, he was trying to escape.  He is concerned that we are putting a spell on him or that we are going to do so.  [place] but then refers to his hospital room as this.  He states that he is comfortable where he is and does not want to hurt anybody.  I left him under security supervision, RN is also sitting with him.  Placed as needed orders for Ativan/Haldol in case he becomes violent  toward others or threat to himself.  Attempted to call his daughter and got her voicemail, no other numbers on file for any family.  Spoke with psychiatry, they will assess him.  He appears to be medically stable as much as I can assess on physical exam and was recently saturating well on room air, I do not think he is hypoperfusing.     Consultants:  Cardiology   Procedures: None     Subjective: See above, patient denies pain or trouble breathing, he is  concerned that we are putting a spell on him are going to try to do so, he wants to escape.     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  I do not have any concern for hypoperfusion.  Psychiatry urgently consulted, spoke on the phone with Dr Weber Cooks who will come assess.  I have asked security to stay with him, I have ordered as needed Ativan/Haldol to be administered if patient is requiring chemical restraints, wheelchair is next to him if he decides he wants to move. At this point he does NOT have capacity to make decisions, awaiting callback from family.  Psychiatry to evaluate for involuntary commitment.  Acute on chronic combined systolic and diastolic congestive heart failure. Hypotension. Chronic hypoxemic respiratory failure. Elevated troponin secondary to congestive heart failure exacerbation. Patient diuresing is slowing down, renal function slightly worse 07/04 but improved 07/05 and stable 07/06-07/08.   07/04 - 07/09 on furosemide drip. -9,450.57 mL [05/07/22 0852] Entresto and beta-blocker was on hold due to hypotension, was on midodrine, then restarted on beta blocker  Per cardiology: 05/08/2022 off Lasix and on torsemide.  Wean midodrine as tolerated and hopefully add low-dose metoprolol XL.  CHF is likely  end-stage given lack of much improvement with aggressive treatment here and no real benefit from interventions would be likely, risk greater than benefit   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 Hypokalemia. Renal function slightly worse He is off Lasix drip and on torsemide A.m. BMP, if improving may be able to discharge but may need to adjust medications if renal function is worse   Essential hypertension. Beta-blocker had been on hold d/t low BP   Goals of care Discussed with patient 05/08/2022 that, based on my conversation with cardiology, his heart failure appears to be at end-stage and not responding to medications, and is not going to be able to safely undergo any procedures which might be helpful.  Heart is likely to get worse over time even despite medication treatments and best efforts.  If he were to experience a cardiac arrest, I do not believe that CPR would save his life, or if we were to achieve return of circulation that it would be unlikely that he would have much quality of  life after that.  He states he would like to talk more with his daughter before making any decisions about CODE STATUS and I think this is reasonable.  See above for events 05/09/2022, at this point he does not have capacity to make decisions      DVT prophylaxis: Lovenox  Code Status: FULL  Family Communication: Patient would like to speak with daughter himself regarding CODE STATUS/situation, will try to call her later this evening or tomorrow Disposition Plan / TOC needs: anticipate back to previous home environment, plan for [facility]  Barriers to discharge / significant pending items: cardiology following, still requiring diuresis, still SOB. Need to address code status w/ patient and family given EF 20-25 and severe CAD, but stable for now -condition however is likely terminal.  Patient is not amenable to palliative care discussion but is open to  discussion of CODE STATUS             Objective: Vitals:   05/08/22 2309 05/09/22 0342 05/09/22 0745 05/09/22 0831  BP: 108/76 98/67 103/73   Pulse: 81 82 81 78  Resp: _0 Temp: 98.1 F (36.7 C) 97.6 F (36.4 C) 97.7 F (36.5 C)   TempSrc: Oral Oral    SpO2: 96% 96% 96% 98%  Weight:  95.5 kg    Height:        Intake/Output Summary (Last 24 hours) at 05/09/2022 1102 Last data filed at 05/08/2022 2000 Gross per 24 hour  Intake 276.79 ml  Output --  Net 276.79 ml    Filed Weights   05/05/22 0504 05/06/22 1031 05/09/22 0342  Weight: 101.1 kg 99.2 kg 95.5 kg    Examination:  Constitutional:  VS as above General Appearance: alert, well-developed, well-nourished, NAD Eyes: Normal lids and conjunctive, non-icteric sclera Ears, Nose, Mouth, Throat: Normal appearance Neck: No masses, trachea midline Respiratory: Normal respiratory effort Breath sounds normal except slight crackles at bases Cardiovascular: S1/S2 normal, no murmur/rub/gallop auscultated +1-2 lower extremity edema Musculoskeletal:  No clubbing/cyanosis of digits Neurological: No cranial nerve deficit on limited exam Psychiatric: Normal judgment/insight Normal mood and affect       Scheduled Medications:   aspirin EC  81 mg Oral Daily   clopidogrel  75 mg Oral Daily   enoxaparin (LOVENOX) injection  0.5 mg/kg Subcutaneous Q24H   insulin aspart  0-5 Units Subcutaneous QHS   insulin aspart  0-9 Units Subcutaneous TID WC   midodrine  5 mg Oral TID WC   multivitamin-lutein  1 capsule Oral BID   polyethylene glycol  17 g Oral Daily   rosuvastatin  20 mg Oral QHS   senna-docusate  2 tablet Oral BID   torsemide  50 mg Oral Daily    Continuous Infusions:    PRN Medications:  acetaminophen, albuterol, calcium carbonate, dextromethorphan-guaiFENesin, haloperidol lactate, hydrALAZINE, LORazepam, nitroGLYCERIN, ondansetron (ZOFRAN) IV, mouth rinse  Antimicrobials:   Anti-infectives (From admission, onward)    None       Data Reviewed: I have personally reviewed following labs and imaging studies  CBC: Recent Labs  Lab 05/03/22 0627 05/04/22 0517 05/05/22 0537 05/09/22 0334  WBC 6.0 6.2 5.5 7.6  HGB 11.8* 12.3* 12.1* 12.6*  HCT 37.5* 38.8* 38.0* 39.1  MCV 88.2 89.2 88.4 86.9  PLT 188 199 186 696    Basic Metabolic Panel: Recent Labs  Lab 05/03/22 0627 05/04/22 0517 05/05/22 0537 05/06/22 0434 05/07/22 0439 05/08/22 0214 05/09/22 0334  NA 132*   < > 135 134*  136 134* 136  K 3.7   < > 3.3* 3.4* 3.0* 3.5 3.1*  CL 96*   < > 95* 92* 93* 90* 91*  CO2 28   < > _0 32 30  GLUCOSE 170*   < > 100* 112* 151* 162* 148*  BUN 53*   < > 50* 52* 49* 53* 58*  CREATININE 1.22   < > 1.10 1.18 1.37* 1.61* 1.73*  CALCIUM 8.4*   < > 8.8* 9.1 9.0 9.2 9.0  MG 2.7*  --   --   --   --   --   --    < > = values in this interval not displayed.    GFR: Estimated Creatinine Clearance: 38.2 mL/min (A) (by C-G formula based on SCr of 1.73 mg/dL (H)). Liver Function Tests: No results for input(s): "AST", "ALT", "ALKPHOS", "BILITOT", "PROT", "ALBUMIN" in the last 168 hours.  No results for input(s): "LIPASE", "AMYLASE" in the last 168 hours. No results for input(s): "AMMONIA" in the last 168 hours. Coagulation Profile: No results for input(s): "INR", "PROTIME" in the last 168 hours. Cardiac Enzymes: No results for input(s): "CKTOTAL", "CKMB", "CKMBINDEX", "TROPONINI" in the last 168 hours. BNP (last 3 results) No results for input(s): "PROBNP" in the last 8760 hours. HbA1C: No results for input(s): "HGBA1C" in the last 72 hours. CBG: Recent Labs  Lab 05/08/22 0817 05/08/22 1221 05/08/22 1619 05/08/22 2117 05/09/22 0743  GLUCAP 188* 237* 222* 152* 138*    Lipid Profile: No results for input(s): "CHOL", "HDL", "LDLCALC", "TRIG", "CHOLHDL", "LDLDIRECT" in the last 72 hours. Thyroid Function Tests: No results for input(s): "TSH",  "T4TOTAL", "FREET4", "T3FREE", "THYROIDAB" in the last 72 hours. Anemia Panel: No results for input(s): "VITAMINB12", "FOLATE", "FERRITIN", "TIBC", "IRON", "RETICCTPCT" in the last 72 hours. Urine analysis:    Component Value Date/Time   COLORURINE YELLOW 03/29/2022 2228   APPEARANCEUR CLEAR 03/29/2022 2228   LABSPEC 1.015 03/29/2022 2228   PHURINE 6.0 03/29/2022 2228   GLUCOSEU >=500 (A) 03/29/2022 2228   HGBUR NEGATIVE 03/29/2022 2228   BILIRUBINUR NEGATIVE 03/29/2022 2228   KETONESUR 5 (A) 03/29/2022 2228   PROTEINUR NEGATIVE 03/29/2022 2228   NITRITE NEGATIVE 03/29/2022 2228   LEUKOCYTESUR NEGATIVE 03/29/2022 2228   Sepsis Labs: _1 (procalcitonin:4,lacticidven:4)  No results found for this or any previous visit (from the past 240 hour(s)).       Radiology Studies last 96 hours: DG Chest Port 1 View  Result Date: 05/08/2022 CLINICAL DATA:  Dyspnea EXAM: PORTABLE CHEST 1 VIEW COMPARISON:  04/28/2022 chest radiograph. FINDINGS: Stable cardiomediastinal silhouette with mild cardiomegaly. No pneumothorax. No pleural effusion. Slightly low lung volumes. New mild diffuse prominence of the parahilar interstitial markings. Mild bibasilar atelectasis, similar. IMPRESSION: 1. Mild cardiomegaly with new mild diffuse prominence of the parahilar interstitial markings, suggesting mild congestive heart failure. 2. Stable mild bibasilar atelectasis. Electronically Signed   By: Ilona Sorrel M.D.   On: 05/08/2022 18:35            LOS: 11 days       Emeterio Reeve, DO [facility] 05/09/2022, 11:02 AM   Staff may message me via secure chat in [id number]  but this may not receive immediate response,  please page for urgent matters!  If 7PM-7AM, please contact night-coverage [email or web address]  Dictation software was used to generate the above note. Typos may occur and escape review, as with typed/written notes. Please contact Dr Sheppard Coil directly for clarity if  needed.

## 2022-05-09 NOTE — Consult Note (Signed)
[facility] Face-to-Face Psychiatry Consult   Reason for Consult: Consult for this 81 year old man with no previous psych history who became agitated this morning while on the cardiology service Referring Physician: Sheppard Coil Patient Identification: Aaron Sosa MRN:  [id number] Principal Diagnosis: Adjustment disorder with mixed disturbance of emotions and conduct Diagnosis:  Principal Problem:   Adjustment disorder with mixed disturbance of emotions and conduct Active Problems:   Essential hypertension   Type 2 diabetes mellitus (HCC)   Elevated troponin   Acute on chronic combined systolic and diastolic congestive heart failure (HCC)   Type II diabetes mellitus with renal manifestations (HCC)   Chronic kidney disease, stage 3a (HCC)   CAD (coronary artery disease)   HLD (hyperlipidemia)   Hypokalemia   Constipation   Chronic hypoxemic respiratory failure (HCC)   Hypotension   Advance care planning   Total Time spent with patient: 30 minutes  Subjective:   Aaron Sosa is a 81 y.o. male patient admitted with "I do not think it is a laughing matter".  HPI: Patient seen and chart reviewed.  81 year old man who had been admitted to the medical service with chronic heart failure.  This morning patient appears to have abruptly become irritable in a manner that seemed out of character for him.  He insisted on getting out of bed and leaving his room.  Staff were concerned that his thinking seemed a little unclear.  Patient was not aggressive or violent and only went as far as sitting on the bench in the main hallway between the 2 wards.  I found the patient neatly dressed and adequately groomed sitting on the bench with a nurse sitting next to him.  Sat down and gently engage the patient in conversation.  Patient appeared to be irritable but not aggressive.  In the course of our conversation it was very clear that he was oriented and in fact that he had a good understanding of his illness.  He  spontaneously talked with me about his heart failure and about his medications.  He was even able to cite his concerns about some specific side effects of medications and other medicines he thought might be reasonable alternatives all of which made perfect sense.  Patient indicated that he was unhappy because 1 member of the treatment team said something to him that he found upsetting.  He said that a woman whom he thought was the cardiologist spoke with him about his heart and it was his impression that she had made a joke and laughed about him eventually dying of heart failure.  In addition to this he also was complaining that he could not sleep at night because there was constant noise.  He was angry about the alarms that would not let him get out of bed or get out of his chair.  He complained about frequent blood draws and the pain from his IV.  At no time did the patient make any statements about self-harm or wish to die.  In fact he made it clear that he did not want to die and that he wanted to be home so that he could be more comfortable in his own surroundings.  There was nothing about his conversation that indicated any response to inappropriate stimuli or any delusions or hallucinations.  Although he was irritable he was generally appropriate and conversant as long as I gave him space and remained respectful.  Patient indicated that he planned to sit on the bench and wait for his  daughter to pick him up.  He understood that this might not be until later in the afternoon.  Past Psychiatric History: No evidence or report of any past psychiatric history.  I do not see any previous mental health notes or concerns or diagnoses raised.  Patient denies any past psychiatric history.  Risk to Self:   Risk to Others:   Prior Inpatient Therapy:   Prior Outpatient Therapy:    Past Medical History:  Past Medical History:  Diagnosis Date   Arthritis    CHF (congestive heart failure) (HCC)    Diabetes  mellitus without complication (HCC)    HOH (hard of hearing)    Lower extremity edema     Past Surgical History:  Procedure Laterality Date   CATARACT EXTRACTION Left    CATARACT EXTRACTION W/PHACO Right 05/06/2015   Procedure: CATARACT EXTRACTION PHACO AND INTRAOCULAR LENS PLACEMENT (IOC);  Surgeon: Lia Hopping, MD;  Location: [facility] ORS;  Service: Ophthalmology;  Laterality: Right;  [place] 1:14.7         AP   11.2          CDE   8.43    cassette lot #[id number]   RETINAL DETACHMENT SURGERY     RIGHT/LEFT HEART CATH AND CORONARY ANGIOGRAPHY N/A 03/17/2020   Procedure: RIGHT/LEFT HEART CATH AND CORONARY ANGIOGRAPHY;  Surgeon: Lamar Blinks, MD;  Location: [facility] INVASIVE CV LAB;  Service: Cardiovascular;  Laterality: N/A;   WRIST FRACTURE SURGERY  1958   Family History:  Family History  Problem Relation Age of Onset   Heart attack Mother    Diabetes Mellitus II Mother    Heart attack Father    Family Psychiatric  History: Did not go into detail with him but none is reported Social History:  Social History   Substance and Sexual Activity  Alcohol Use Yes   Alcohol/week: 1.0 standard drink of alcohol   Types: 1 Cans of beer per week     Social History   Substance and Sexual Activity  Drug Use Never    Social History   Socioeconomic History   Marital status: Widowed    Spouse name: Not on file   Number of children: Not on file   Years of education: High School   Highest education level: High school graduate  Occupational History   Not on file  Tobacco Use   Smoking status: Former    Packs/day: 2.00    Years: 5.00    Total pack years: 10.00    Types: Cigarettes   Smokeless tobacco: Former  Building services engineer Use: Never used  Substance and Sexual Activity   Alcohol use: Yes    Alcohol/week: 1.0 standard drink of alcohol    Types: 1 Cans of beer per week   Drug use: Never   Sexual activity: Not on file  Other Topics Concern   Not on file  Social History Narrative    Not on file   Social Determinants of Health   Financial Resource Strain: Not on file  Food Insecurity: Not on file  Transportation Needs: Not on file  Physical Activity: Inactive (07/16/2019)   Exercise Vital Sign    Days of Exercise per Week: 0 days    Minutes of Exercise per Session: 0 min  Stress: Not on file  Social Connections: Not on file   Additional Social History:    Allergies:   Allergies  Allergen Reactions   Empagliflozin Other (See Comments)   Prednisone Other (See  Comments) and Swelling    Fluid build up    Shrimp Extract Allergy Skin Test Anaphylaxis   Atorvastatin Other (See Comments)    myalgia Other reaction(s): Unknown myalgia   Codeine     Other reaction(s): Unknown Makes him feel crazy   Dust Mite Extract    Shrimp [Shellfish Allergy]     Labs:  Results for orders placed or performed during the hospital encounter of 04/28/22 (from the past 48 hour(s))  Glucose, capillary     Status: Abnormal   Collection Time: 05/07/22  4:25 PM  Result Value Ref Range   Glucose-Capillary 203 (H) 70 - 99 mg/dL    Comment: Glucose reference range applies only to samples taken after fasting for at least 8 hours.  Glucose, capillary     Status: Abnormal   Collection Time: 05/07/22  9:09 PM  Result Value Ref Range   Glucose-Capillary 187 (H) 70 - 99 mg/dL    Comment: Glucose reference range applies only to samples taken after fasting for at least 8 hours.  Basic metabolic panel     Status: Abnormal   Collection Time: 05/08/22  2:14 AM  Result Value Ref Range   Sodium 134 (L) 135 - 145 mmol/L   Potassium 3.5 3.5 - 5.1 mmol/L   Chloride 90 (L) 98 - 111 mmol/L   CO2 32 22 - 32 mmol/L   Glucose, Bld 162 (H) 70 - 99 mg/dL    Comment: Glucose reference range applies only to samples taken after fasting for at least 8 hours.   BUN 53 (H) 8 - 23 mg/dL   Creatinine, Ser 1.61 (H) 0.61 - 1.24 mg/dL   Calcium 9.2 8.9 - 10.3 mg/dL   GFR, Estimated 43 (L) >60 mL/min     Comment: (NOTE) Calculated using the CKD-EPI Creatinine Equation (2021)    Anion gap 12 5 - 15    Comment: Performed at [facility], [address] [place], [place] [address]  Glucose, capillary     Status: Abnormal   Collection Time: 05/08/22  8:17 AM  Result Value Ref Range   Glucose-Capillary 188 (H) 70 - 99 mg/dL    Comment: Glucose reference range applies only to samples taken after fasting for at least 8 hours.  Glucose, capillary     Status: Abnormal   Collection Time: 05/08/22 12:21 PM  Result Value Ref Range   Glucose-Capillary 237 (H) 70 - 99 mg/dL    Comment: Glucose reference range applies only to samples taken after fasting for at least 8 hours.  Glucose, capillary     Status: Abnormal   Collection Time: 05/08/22  4:19 PM  Result Value Ref Range   Glucose-Capillary 222 (H) 70 - 99 mg/dL    Comment: Glucose reference range applies only to samples taken after fasting for at least 8 hours.  Glucose, capillary     Status: Abnormal   Collection Time: 05/08/22  9:17 PM  Result Value Ref Range   Glucose-Capillary 152 (H) 70 - 99 mg/dL    Comment: Glucose reference range applies only to samples taken after fasting for at least 8 hours.  CBC     Status: Abnormal   Collection Time: 05/09/22  3:34 AM  Result Value Ref Range   WBC 7.6 4.0 - 10.5 K/uL   RBC 4.50 4.22 - 5.81 MIL/uL   Hemoglobin 12.6 (L) 13.0 - 17.0 g/dL   HCT 39.1 39.0 - 52.0 %   MCV 86.9 80.0 - 100.0 fL  MCH 28.0 26.0 - 34.0 pg   MCHC 32.2 30.0 - 36.0 g/dL   RDW 14.6 11.5 - 15.5 %   Platelets 210 150 - 400 K/uL   nRBC 0.0 0.0 - 0.2 %    Comment: Performed at [facility], [address] [place], [place] [id number]  Basic metabolic panel     Status: Abnormal   Collection Time: 05/09/22  3:34 AM  Result Value Ref Range   Sodium 136 135 - 145 mmol/L   Potassium 3.1 (L) 3.5 - 5.1 mmol/L   Chloride 91 (L) 98 - 111 mmol/L   CO2 30 22 - 32 mmol/L   Glucose, Bld 148 (H) 70 - 99 mg/dL     Comment: Glucose reference range applies only to samples taken after fasting for at least 8 hours.   BUN 58 (H) 8 - 23 mg/dL   Creatinine, Ser 1.73 (H) 0.61 - 1.24 mg/dL   Calcium 9.0 8.9 - 10.3 mg/dL   GFR, Estimated 39 (L) >60 mL/min    Comment: (NOTE) Calculated using the CKD-EPI Creatinine Equation (2021)    Anion gap 15 5 - 15    Comment: Performed at [facility], [address] [place], [place] [address]  Glucose, capillary     Status: Abnormal   Collection Time: 05/09/22  7:43 AM  Result Value Ref Range   Glucose-Capillary 138 (H) 70 - 99 mg/dL    Comment: Glucose reference range applies only to samples taken after fasting for at least 8 hours.  Glucose, capillary     Status: Abnormal   Collection Time: 05/09/22 12:42 PM  Result Value Ref Range   Glucose-Capillary 144 (H) 70 - 99 mg/dL    Comment: Glucose reference range applies only to samples taken after fasting for at least 8 hours.    Current Facility-Administered Medications  Medication Dose Route Frequency Provider Last Rate Last Admin   acetaminophen (TYLENOL) tablet 650 mg  650 mg Oral Q6H PRN Ivor Costa, MD   650 mg at 05/03/22 2337   albuterol (PROVENTIL) (2.5 MG/3ML) 0.083% nebulizer solution 2.5 mg  2.5 mg Inhalation Q4H PRN Ivor Costa, MD       aspirin EC tablet 81 mg  81 mg Oral Daily Ivor Costa, MD   81 mg at 05/08/22 0910   calcium carbonate (TUMS - dosed in mg elemental calcium) chewable tablet 200 mg of elemental calcium  1 tablet Oral TID PRN Ivor Costa, MD       clopidogrel (PLAVIX) tablet 75 mg  75 mg Oral Daily Ivor Costa, MD   75 mg at 05/08/22 0910   dextromethorphan-guaiFENesin (MUCINEX DM) 30-600 MG per 12 hr tablet 1 tablet  1 tablet Oral BID PRN Ivor Costa, MD   1 tablet at 05/08/22 1807   enoxaparin (LOVENOX) injection 50 mg  0.5 mg/kg Subcutaneous Q24H Oswald Hillock, RPH   50 mg at 05/08/22 2211   haloperidol lactate (HALDOL) injection 1 mg  1 mg Intramuscular Q6H PRN Emeterio Reeve, DO       hydrALAZINE (APRESOLINE) injection 5 mg  5 mg Intravenous Q2H PRN Ivor Costa, MD       insulin aspart (novoLOG) injection 0-5 Units  0-5 Units Subcutaneous QHS Ivor Costa, MD   2 Units at 05/06/22 2106   insulin aspart (novoLOG) injection 0-9 Units  0-9 Units Subcutaneous TID WC Ivor Costa, MD   3 Units at 05/08/22 1827   LORazepam (ATIVAN) injection 1 mg  1  mg Intramuscular Q6H PRN Sunnie Nielsen, DO       midodrine (PROAMATINE) tablet 5 mg  5 mg Oral TID WC Marrion Coy, MD   5 mg at 05/08/22 1826   multivitamin-lutein (OCUVITE-LUTEIN) capsule 1 capsule  1 capsule Oral BID Lorretta Harp, MD   1 capsule at 05/08/22 2211   nitroGLYCERIN (NITROSTAT) SL tablet 0.4 mg  0.4 mg Sublingual Q5 min PRN Lorretta Harp, MD       ondansetron [facility]) injection 4 mg  4 mg Intravenous Q8H PRN Lorretta Harp, MD       Oral care mouth rinse  15 mL Mouth Rinse PRN Marrion Coy, MD       polyethylene glycol (MIRALAX / GLYCOLAX) packet 17 g  17 g Oral Daily Lorretta Harp, MD   17 g at 05/05/22 0854   potassium chloride (KLOR-CON) packet 40 mEq  40 mEq Oral Once Rebeca Allegra, PA-C       rosuvastatin (CRESTOR) tablet 20 mg  20 mg Oral QHS Lorretta Harp, MD   20 mg at 05/08/22 2210   senna-docusate (Senokot-S) tablet 2 tablet  2 tablet Oral BID Marrion Coy, MD   2 tablet at 05/08/22 1826   torsemide (DEMADEX) tablet 50 mg  50 mg Oral Daily Rebeca Allegra, PA-C   50 mg at 05/08/22 1307    Musculoskeletal: Strength & Muscle Tone: within normal limits Gait & Station: normal Patient leans: N/A            Psychiatric Specialty Exam:  Presentation  General Appearance: No data recorded Eye Contact:No data recorded Speech:No data recorded Speech Volume:No data recorded Handedness:No data recorded  Mood and Affect  Mood:No data recorded Affect:No data recorded  Thought Process  Thought Processes:No data recorded Descriptions of Associations:No data recorded Orientation:No data  recorded Thought Content:No data recorded History of Schizophrenia/Schizoaffective disorder:No data recorded Duration of Psychotic Symptoms:No data recorded Hallucinations:No data recorded Ideas of Reference:No data recorded Suicidal Thoughts:No data recorded Homicidal Thoughts:No data recorded  Sensorium  Memory:No data recorded Judgment:No data recorded Insight:No data recorded  Executive Functions  Concentration:No data recorded Attention Span:No data recorded Recall:No data recorded Fund of Knowledge:No data recorded Language:No data recorded  Psychomotor Activity  Psychomotor Activity:No data recorded  Assets  Assets:No data recorded  Sleep  Sleep:No data recorded  Physical Exam: Physical Exam Vitals and nursing note reviewed.  Constitutional:      Appearance: Normal appearance. He is well-developed.  HENT:     Head: Normocephalic and atraumatic.     Mouth/Throat:     Pharynx: Oropharynx is clear.  Eyes:     Pupils: Pupils are equal, round, and reactive to light.  Cardiovascular:     Rate and Rhythm: Normal rate and regular rhythm.  Pulmonary:     Effort: Pulmonary effort is normal.  Abdominal:     General: Abdomen is flat.  Musculoskeletal:        General: Normal range of motion.  Skin:    General: Skin is warm and dry.  Neurological:     General: No focal deficit present.     Mental Status: He is alert. Mental status is at baseline.  Psychiatric:        Attention and Perception: Attention normal.        Mood and Affect: Mood normal.        Speech: Speech normal.        Behavior: Behavior is cooperative.        Thought Content:  Thought content normal.        Cognition and Memory: Cognition normal.        Judgment: Judgment is impulsive.    Review of Systems  Constitutional: Negative.   HENT: Negative.    Eyes: Negative.   Respiratory: Negative.    Cardiovascular: Negative.   Gastrointestinal: Negative.   Musculoskeletal: Negative.   Skin:  Negative.   Neurological: Negative.   Psychiatric/Behavioral:  Negative for depression, hallucinations, memory loss, substance abuse and suicidal ideas. The patient has insomnia. The patient is not nervous/anxious.    Blood pressure 114/79, pulse 84, temperature 98.1 F (36.7 C), temperature source Oral, resp. rate 16, height 5\' 8"  (1.727 m), weight 95.5 kg, SpO2 96 %. Body mass index is 32.01 kg/m.  Treatment Plan Summary: Plan this is an 81 year old man with multiple chronic medical problems.  He became agitated this morning and concern was raised about possible delirium or unsafe behavior.  Patient was clearly in a bad mood when I sat down with him so I chose not to press him hard or to perform a full cognitive exam so that we could maintain some rapport and have a conversation.  As noted above it was plain in the course of our talk that he was completely oriented and that he understood his cardiac disease in a normal manner.  It was clear that he had no intention of self-harm or desire to harm himself or anyone else.  Furthermore he made it clear that he intended to continue taking his medicines and follow up with his outpatient physician.  There was no indication that I could see that he was delirious and there was no indication that I could see that would justify forcibly moving him from where he was sitting.  I suggested to the patient that the treatment team was planning to discharge him today anyway and that perhaps he would be more comfortable sitting in his room where his lunch would be served.  Patient acknowledged this but said he would prefer to sit in the hallway.  Although this may justify having a sitter with him during the afternoon and I think that outweighs any attempts at forcibly trying to move him back to his room.  I wished the patient well.  I do not see any indication for any psychiatric intervention or medication.  Communication made with the primary treatment team.  It was my  understanding they were planning to discharge him today anyway medically.  I have supported the plan to try to reach his daughter sooner rather than later and see if she can pick him up so that he can be discharged.  Disposition: No evidence of imminent risk to self or others at present.   Patient does not meet criteria for psychiatric inpatient admission. Supportive therapy provided about ongoing stressors.  Alethia Berthold, MD 05/09/2022 2:28 PM

## 2022-05-18 ENCOUNTER — Ambulatory Visit: Payer: Medicare HMO | Admitting: Family

## 2022-06-16 ENCOUNTER — Telehealth: Payer: Self-pay | Admitting: Family

## 2022-06-16 ENCOUNTER — Ambulatory Visit: Payer: Medicare HMO | Admitting: Family

## 2022-06-16 NOTE — Progress Notes (Deleted)
   Patient ID: AVRAHAM BENISH, male    DOB: 01-27-1941, 81 y.o.   MRN: [id number]  HPI  Mr Edmundson is a 81 y/o male with a history of  Echo report from 11/06/21 reviewed and showed an EF of 25-30% along with mild/moderate MR and moderate LAE.   RHC/LHC done 03/17/20 and showed: Prox RCA lesion is 95% stenosed. Mid RCA lesion is 60% stenosed. RPDA lesion is 70% stenosed. Ost LAD to Prox LAD lesion is 99% stenosed. Prox LAD lesion is 90% stenosed. RPAV lesion is 85% stenosed. Mid LAD lesion is 100% stenosed. 1st Diag lesion is 99% stenosed. Ost Cx to Prox Cx lesion is 99% stenosed. 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  Admitted 04/28/22 due to   He presents today for his initial visit with a chief complaint of   Review of Systems    Physical Exam     Assessment & Plan:   1: Chronic heart failure with reduced ejection fraction- - NYHA class

## 2022-06-16 NOTE — Telephone Encounter (Signed)
Patient did not show for his initial Heart Failure Clinic appointment on 06/16/22. Will attempt to reschedule.   

## 2022-07-14 ENCOUNTER — Ambulatory Visit: Payer: Medicare HMO | Attending: Family | Admitting: Family

## 2022-07-14 ENCOUNTER — Encounter: Payer: Self-pay | Admitting: Family

## 2022-07-14 VITALS — BP 101/71 | HR 95 | Resp 18 | Ht 68.0 in | Wt 180.5 lb

## 2022-07-14 DIAGNOSIS — N1831 Chronic kidney disease, stage 3a: Secondary | ICD-10-CM | POA: Diagnosis not present

## 2022-07-14 DIAGNOSIS — K5900 Constipation, unspecified: Secondary | ICD-10-CM | POA: Insufficient documentation

## 2022-07-14 DIAGNOSIS — I130 Hypertensive heart and chronic kidney disease with heart failure and stage 1 through stage 4 chronic kidney disease, or unspecified chronic kidney disease: Secondary | ICD-10-CM | POA: Diagnosis not present

## 2022-07-14 DIAGNOSIS — Z7902 Long term (current) use of antithrombotics/antiplatelets: Secondary | ICD-10-CM | POA: Insufficient documentation

## 2022-07-14 DIAGNOSIS — E1122 Type 2 diabetes mellitus with diabetic chronic kidney disease: Secondary | ICD-10-CM | POA: Insufficient documentation

## 2022-07-14 DIAGNOSIS — Z79899 Other long term (current) drug therapy: Secondary | ICD-10-CM | POA: Insufficient documentation

## 2022-07-14 DIAGNOSIS — Z7984 Long term (current) use of oral hypoglycemic drugs: Secondary | ICD-10-CM | POA: Diagnosis not present

## 2022-07-14 DIAGNOSIS — E785 Hyperlipidemia, unspecified: Secondary | ICD-10-CM | POA: Insufficient documentation

## 2022-07-14 DIAGNOSIS — Z9981 Dependence on supplemental oxygen: Secondary | ICD-10-CM | POA: Diagnosis not present

## 2022-07-14 DIAGNOSIS — I5022 Chronic systolic (congestive) heart failure: Secondary | ICD-10-CM | POA: Diagnosis present

## 2022-07-14 DIAGNOSIS — Z8249 Family history of ischemic heart disease and other diseases of the circulatory system: Secondary | ICD-10-CM | POA: Diagnosis not present

## 2022-07-14 DIAGNOSIS — Z87891 Personal history of nicotine dependence: Secondary | ICD-10-CM | POA: Diagnosis not present

## 2022-07-14 DIAGNOSIS — Z7982 Long term (current) use of aspirin: Secondary | ICD-10-CM | POA: Insufficient documentation

## 2022-07-14 DIAGNOSIS — H9190 Unspecified hearing loss, unspecified ear: Secondary | ICD-10-CM | POA: Insufficient documentation

## 2022-07-14 DIAGNOSIS — J9611 Chronic respiratory failure with hypoxia: Secondary | ICD-10-CM | POA: Diagnosis not present

## 2022-07-14 DIAGNOSIS — E1165 Type 2 diabetes mellitus with hyperglycemia: Secondary | ICD-10-CM | POA: Diagnosis not present

## 2022-07-14 DIAGNOSIS — I2510 Atherosclerotic heart disease of native coronary artery without angina pectoris: Secondary | ICD-10-CM | POA: Diagnosis not present

## 2022-07-14 DIAGNOSIS — I10 Essential (primary) hypertension: Secondary | ICD-10-CM | POA: Diagnosis not present

## 2022-07-14 NOTE — Progress Notes (Deleted)
Patient ID: NORWOOD QUEZADA, male    DOB: Jul 25, 1941, 81 y.o.   MRN: [id number]  HPI  Mr. Wishart is a 81 year old male who presents as a new patient with a history of HTN. 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   Echo Left ventricular ejection fraction, by estimation, is 25 to 30%.  Last ED Admit 04/28/22 Acute on chronic congestive heart failure; hypokalemia  Patient presents with chief complaint of shortness of breath. He is accompanied by his daughter who he lives with. He has associated dizziness, fatigue, cough, abdominal distention, constipation and swelling in the lower extremities. Denies chest pain/pressure. Follows a low-sodium diet but cannot weigh himself daily. Takes blood sugars once daily.   Past Medical History:  Diagnosis Date   Arthritis    CHF (congestive heart failure) (HCC)    Diabetes mellitus without complication (HCC)    HOH (hard of hearing)    Lower extremity edema    Past Surgical History:  Procedure Laterality Date   CATARACT EXTRACTION Left    CATARACT EXTRACTION W/PHACO Right 05/06/2015   Procedure: CATARACT EXTRACTION PHACO AND INTRAOCULAR LENS PLACEMENT (IOC);  Surgeon: Lia Hopping, MD;  Location: [facility] ORS;  Service: Ophthalmology;  Laterality: Right;  [place] 1:14.7         AP   11.2          CDE   8.43    cassette lot #[id number]   RETINAL DETACHMENT SURGERY     RIGHT/LEFT HEART CATH AND CORONARY ANGIOGRAPHY N/A 03/17/2020   Procedure: RIGHT/LEFT HEART CATH AND CORONARY ANGIOGRAPHY;  Surgeon: Lamar Blinks, MD;  Location: [facility];  Service: Cardiovascular;  Laterality: N/A;   WRIST FRACTURE SURGERY  1958   Family History  Problem Relation Age of Onset   Heart attack Mother    Diabetes Mellitus II Mother    Heart attack Father    Social History   Tobacco Use   Smoking status: Former    Packs/day: 2.00    Years: 5.00    Total pack years: 10.00    Types: Cigarettes   Smokeless  tobacco: Former  Substance Use Topics   Alcohol use: Yes    Alcohol/week: 1.0 standard drink of alcohol    Types: 1 Cans of beer per week   Allergies  Allergen Reactions   Empagliflozin Other (See Comments)   Prednisone Other (See Comments) and Swelling    Fluid build up    Shrimp Extract Allergy Skin Test Anaphylaxis   Atorvastatin Other (See Comments)    myalgia Other reaction(s): Unknown myalgia   Codeine     Other reaction(s): Unknown Makes him feel crazy   Dust Mite Extract    Shrimp [Shellfish Allergy]     Prior to Admission medications   Medication Sig Start Date End Date Taking? Authorizing Provider  aspirin EC 81 MG EC tablet Take 1 tablet (81 mg total) by mouth daily. 03/19/20  Yes Esaw Grandchild A, DO  calcium carbonate (TUMS - DOSED IN MG ELEMENTAL CALCIUM) 500 MG chewable tablet Chew 1 tablet (200 mg of elemental calcium total) by mouth 3 (three) times daily as needed for indigestion or heartburn. 03/18/20  Yes Esaw Grandchild A, DO  clopidogrel (PLAVIX) 75 MG tablet Take 75 mg by mouth daily. 07/08/21  Yes [provider]  dapagliflozin propanediol (FARXIGA) 5 MG TABS tablet Take 5 mg by mouth daily.   Yes [provider]  glipiZIDE (GLUCOTROL XL) 10 MG 24 hr tablet Take 10 mg by mouth daily. 10/15/21  Yes [provider]  [facility] ULTRA test strip Use to check blood sugar up to 2 x daily 06/29/20  Yes Karamalegos, Netta Neat, DO  rosuvastatin (CRESTOR) 20 MG tablet Take 20 mg by mouth at bedtime. 10/27/21  Yes [provider]  senna-docusate (SENOKOT-S) 8.6-50 MG tablet Take 2 tablets by mouth 2 (two) times daily as needed for constipation. 03/27/20  Yes [provider]  torsemide (DEMADEX) 10 MG tablet Take 5 tablets (50 mg total) by mouth daily. 05/10/22  Yes Sunnie Nielsen, DO  acetaminophen (TYLENOL) 325 MG tablet Take 2 tablets (650 mg total) by mouth every 6 (six) hours as needed for mild pain or fever. Patient not  taking: Reported on 07/14/2022 05/09/22   Sunnie Nielsen, DO  docusate sodium (COLACE) 100 MG capsule Take 1 capsule (100 mg total) by mouth 2 (two) times daily as needed for mild constipation or moderate constipation. Patient not taking: Reported on 07/14/2022 03/18/20   Esaw Grandchild A, DO  magnesium oxide (MAG-OX) 400 MG tablet Take 400 mg by mouth 2 (two) times daily. Patient not taking: Reported on 07/14/2022    [provider]  metFORMIN (GLUCOPHAGE-XR) 500 MG 24 hr tablet Take 1,000 mg by mouth daily with supper. Patient not taking: Reported on 07/14/2022    [provider]  midodrine (PROAMATINE) 5 MG tablet Take 1 tablet (5 mg total) by mouth 3 (three) times daily with meals. Patient not taking: Reported on 07/14/2022 05/09/22   Sunnie Nielsen, DO  nitroGLYCERIN (NITROSTAT) 0.4 MG SL tablet Place 0.4 mg under the tongue every 5 (five) minutes as needed for chest pain. Patient not taking: Reported on 07/14/2022    [provider]  polyethylene glycol powder (GLYCOLAX/MIRALAX) 17 GM/SCOOP powder Take 17 g by mouth daily as needed. Take 1 packet (17 g total) by mouth once daily as needed for Constipation Mix in 4-8ounces of fluid prior to taking. Patient not taking: Reported on 07/14/2022 03/27/20   [provider]     Review of Systems  Constitutional:  Positive for fatigue. Negative for fever.  Eyes:  Negative for visual disturbance.  Respiratory:  Positive for cough and shortness of breath.   Cardiovascular:  Positive for leg swelling.  Gastrointestinal:  Positive for abdominal distention and constipation.  Musculoskeletal:  Positive for arthralgias.  Allergic/Immunologic: Positive for food allergies (shrimp).  Neurological:  Negative for dizziness and headaches.    Vitals:   07/14/22 1328  BP: 101/71  Pulse: 95  Resp: 18  SpO2: 100%   Wt Readings from Last 3 Encounters:  07/14/22 180 lb 8 oz (81.9 kg)  05/09/22 210 lb 8 oz (95.5 kg)   11/14/21 218 lb 4.1 oz (99 kg)    Lab Results  Component Value Date   CREATININE 1.73 (H) 05/09/2022   CREATININE 1.61 (H) 05/08/2022   CREATININE 1.37 (H) 05/07/2022     Physical Exam Vitals reviewed.  HENT:     Head: Normocephalic.  Cardiovascular:     Rate and Rhythm: Normal rate.     Heart sounds: Normal heart sounds.  Pulmonary:     Effort: Pulmonary effort is normal.     Breath sounds: Examination of the right-lower field reveals decreased breath sounds. Examination of the left-lower field reveals decreased breath sounds. Decreased breath sounds present. No wheezing.  Musculoskeletal:     Right lower leg: Edema present.  Left lower leg: Edema present.  Skin:    General: Skin is warm and dry.  Neurological:     Mental Status: He is alert and oriented to person, place, and time.  Psychiatric:        Mood and Affect: Mood normal.        Behavior: Behavior normal.    Assessment and Plan   1. Chronic heart failure with reduced ejection fraction- - NYHA class II - euvolemic today based on patient's description of symptoms - weighing daily and he was instructed to call for an overnight weight gain of >2 pounds or a weekly weight gain of >5 pounds - not adding salt to his food and they've been reading food labels for sodium content. Encouraged him to try to keep daily sodium intake to 2000mg  / day - BNP 04/28/22 was 2501.1 - Saw Cardiology 06/16/22 ([facility])  - Encouraged him to wear compression hoses.  2: HTN- - BP (101/71) - Follows with PCP Dareen Piano) 06/30/22 - BMP from 05/09/22 reviewed and showed sodium 136, potassium 3.1, creatinine 1.73 and GFR 39  3: DM - A1C (04/29/22) 8.2 - BG 211 this AM   Medication reviewed with patient.  Follow-up in 6 weeks, sooner if needed.

## 2022-07-14 NOTE — Progress Notes (Signed)
Patient ID: Aaron Sosa, male    DOB: [birth year], 81 y.o.   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   Echo report from 11/06/21 reviewed and showed Left ventricular ejection fraction, by estimation, is 25 to 30%.  RHC/LHC done 03/17/20 and showed: Prox RCA lesion is 95% stenosed. Mid RCA lesion is 60% stenosed. RPDA lesion is 70% stenosed. Ost LAD to Prox LAD lesion is 99% stenosed. Prox LAD lesion is 90% stenosed. RPAV lesion is 85% stenosed. Mid LAD lesion is 100% stenosed. 1st Diag lesion is 99% stenosed. Ost Cx to Prox Cx lesion is 99% stenosed. 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  Admitted 04/28/22 due to acute on chronic congestive heart failure. Initially given IV lasix with transition to lasix gtt due to worsening renal function. BP low so entresto stopped and midodrine started. Diuretic transitioned to oral torsemide. Cardiology and psychiatry consults obtained. PT/OT evaluations done. Discharged after 11 days.   Patient presents for his initial visit with chief complaint of shortness of breath. He has associated dizziness, fatigue, cough, abdominal distention, constipation and swelling in the lower extremities. Denies chest pain/pressure.  Follows a low-sodium diet but cannot weigh himself daily. Takes blood sugars once daily. He is accompanied by his daughter who he lives with.   Past Medical History:  Diagnosis Date   Arthritis    CHF (congestive heart failure) (HCC)    Chronic kidney disease    Coronary artery disease    Diabetes mellitus without complication (HCC)    HOH (hard of hearing)    Lower extremity edema    Past Surgical History:  Procedure Laterality Date   CATARACT EXTRACTION Left    CATARACT EXTRACTION W/PHACO Right 05/06/2015   Procedure: CATARACT EXTRACTION PHACO AND INTRAOCULAR LENS PLACEMENT  (IOC);  Surgeon: Lia Hopping, MD;  Location: [facility] ORS;  Service: Ophthalmology;  Laterality: Right;  [place] 1:14.7         AP   11.2          CDE   8.43    cassette lot #[id number]   RETINAL DETACHMENT SURGERY     RIGHT/LEFT HEART CATH AND CORONARY ANGIOGRAPHY N/A 03/17/2020   Procedure: RIGHT/LEFT HEART CATH AND CORONARY ANGIOGRAPHY;  Surgeon: Lamar Blinks, MD;  Location: [facility];  Service: Cardiovascular;  Laterality: N/A;   WRIST FRACTURE SURGERY  1958   Family History  Problem Relation Age of Onset   Heart attack Mother    Diabetes Mellitus II Mother    Heart attack Father    Social History   Tobacco Use   Smoking status: Former    Packs/day: 2.00    Years: 5.00    Total pack years: 10.00    Types: Cigarettes   Smokeless tobacco: Former  Substance Use Topics   Alcohol use: Yes    Alcohol/week: 1.0 standard drink of alcohol    Types: 1 Cans of beer per week   Allergies  Allergen Reactions   Empagliflozin Other (See Comments)   Prednisone Other (See Comments) and Swelling    Fluid build up    Shrimp Extract Allergy Skin Test Anaphylaxis   Atorvastatin Other (See Comments)    myalgia Other reaction(s): Unknown myalgia   Codeine     Other reaction(s): Unknown Makes him feel crazy   Dust Mite Extract  Shrimp [Shellfish Allergy]     Prior to Admission medications   Medication Sig Start Date End Date Taking? Authorizing Provider  aspirin EC 81 MG EC tablet Take 1 tablet (81 mg total) by mouth daily. 03/19/20  Yes Esaw Grandchild A, DO  calcium carbonate (TUMS - DOSED IN MG ELEMENTAL CALCIUM) 500 MG chewable tablet Chew 1 tablet (200 mg of elemental calcium total) by mouth 3 (three) times daily as needed for indigestion or heartburn. 03/18/20  Yes Esaw Grandchild A, DO  clopidogrel (PLAVIX) 75 MG tablet Take 75 mg by mouth daily. 07/08/21  Yes [provider]  dapagliflozin propanediol (FARXIGA) 5 MG TABS tablet Take 5 mg by mouth daily.   Yes  [provider]  glipiZIDE (GLUCOTROL XL) 10 MG 24 hr tablet Take 10 mg by mouth daily. 10/15/21  Yes [provider]  [name] ULTRA test strip Use to check blood sugar up to 2 x daily 06/29/20  Yes Karamalegos, Netta Neat, DO  rosuvastatin (CRESTOR) 20 MG tablet Take 20 mg by mouth at bedtime. 10/27/21  Yes [provider]  senna-docusate (SENOKOT-S) 8.6-50 MG tablet Take 2 tablets by mouth 2 (two) times daily as needed for constipation. 03/27/20  Yes [provider]  torsemide (DEMADEX) 10 MG tablet Take 5 tablets (50 mg total) by mouth daily. 05/10/22  Yes Sunnie Nielsen, DO  acetaminophen (TYLENOL) 325 MG tablet Take 2 tablets (650 mg total) by mouth every 6 (six) hours as needed for mild pain or fever. Patient not taking: Reported on 07/14/2022 05/09/22   Sunnie Nielsen, DO  docusate sodium (COLACE) 100 MG capsule Take 1 capsule (100 mg total) by mouth 2 (two) times daily as needed for mild constipation or moderate constipation. Patient not taking: Reported on 07/14/2022 03/18/20   Esaw Grandchild A, DO  magnesium oxide (MAG-OX) 400 MG tablet Take 400 mg by mouth 2 (two) times daily. Patient not taking: Reported on 07/14/2022    [provider]  metFORMIN (GLUCOPHAGE-XR) 500 MG 24 hr tablet Take 1,000 mg by mouth daily with supper. Patient not taking: Reported on 07/14/2022    [provider]  midodrine (PROAMATINE) 5 MG tablet Take 1 tablet (5 mg total) by mouth 3 (three) times daily with meals. Patient not taking: Reported on 07/14/2022 05/09/22   Sunnie Nielsen, DO  nitroGLYCERIN (NITROSTAT) 0.4 MG SL tablet Place 0.4 mg under the tongue every 5 (five) minutes as needed for chest pain. Patient not taking: Reported on 07/14/2022    [provider]  polyethylene glycol powder (GLYCOLAX/MIRALAX) 17 GM/SCOOP powder Take 17 g by mouth daily as needed. Take 1 packet (17 g total) by mouth once daily as needed for Constipation Mix in  4-8ounces of fluid prior to taking. Patient not taking: Reported on 07/14/2022 03/27/20   [provider]    Review of Systems  Constitutional:  Positive for fatigue. Negative for appetite change.  HENT:  Positive for hearing loss. Negative for congestion, postnasal drip and sore throat.   Eyes:  Negative for visual disturbance.  Respiratory:  Positive for cough and shortness of breath. Negative for chest tightness.   Cardiovascular:  Positive for leg swelling. Negative for chest pain and palpitations.  Gastrointestinal:  Positive for abdominal distention and constipation. Negative for abdominal pain.  Endocrine: Negative.   Musculoskeletal:  Positive for arthralgias.  Skin: Negative.   Allergic/Immunologic: Positive for food allergies (shrimp).  Neurological:  Positive for dizziness. Negative for light-headedness.  Hematological:  Negative for adenopathy.  Does not bruise/bleed easily.  Psychiatric/Behavioral:  Negative for dysphoric mood and sleep disturbance. The patient is not nervous/anxious.    Vitals:   07/14/22 1328  BP: 101/71  Pulse: 95  Resp: 18  SpO2: 100%   Wt Readings from Last 3 Encounters:  07/14/22 180 lb 8 oz (81.9 kg)  05/09/22 210 lb 8 oz (95.5 kg)  11/14/21 218 lb 4.1 oz (99 kg)    Lab Results  Component Value Date   CREATININE 1.73 (H) 05/09/2022   CREATININE 1.61 (H) 05/08/2022   CREATININE 1.37 (H) 05/07/2022     Physical Exam Vitals and nursing note reviewed. Exam conducted with a chaperone present (daughter).  Constitutional:      Appearance: Normal appearance.  HENT:     Head: Normocephalic and atraumatic.     Right Ear: Decreased hearing noted.     Left Ear: Decreased hearing noted.  Cardiovascular:     Rate and Rhythm: Normal rate and regular rhythm.     Heart sounds: Normal heart sounds.  Pulmonary:     Effort: Pulmonary effort is normal.     Breath sounds: Examination of the right-lower field reveals decreased breath sounds.  Examination of the left-lower field reveals decreased breath sounds. Decreased breath sounds present. No wheezing, rhonchi or rales.  Abdominal:     General: There is no distension.     Palpations: Abdomen is soft.     Tenderness: There is no abdominal tenderness.  Musculoskeletal:        General: No tenderness.     Cervical back: Normal range of motion and neck supple.     Right lower leg: Edema (1+ pitting) present.     Left lower leg: Edema (1+ pitting) present.  Skin:    General: Skin is warm and dry.     Comments: Multiple open areas on bilateral shins from where skin blistered and was weeping  Neurological:     Mental Status: He is alert and oriented to person, place, and time.  Psychiatric:        Mood and Affect: Mood normal.        Behavior: Behavior normal.    Assessment and Plan:   1. Chronic heart failure with reduced ejection fraction- - NYHA class II - euvolemic today  - weighing daily and he was instructed to call for an overnight weight gain of >2 pounds or a weekly weight gain of >5 pounds - not adding salt to his food and they've been reading food labels for sodium content. Encouraged him to try to keep daily sodium intake to 2000mg  / day - Saw Cardiology ([facility]) 06/16/22  - on GDMT of farxiga - current BP will not allow for maximizing GDMT - Encouraged him to wear compression hoses once wounds heal - elevate legs when sitting for long periods of time - BNP 04/28/22 was 2501.1  2: HTN- - BP looks good(101/71) - saw PCP Dareen Piano) 06/30/22 - BMP from 06/30/22 reviewed and showed sodium 127, potassium 3.7, creatinine 1.5 and GFR 45  3: DM- - A1C on 06/30/22 was 10.5% - BG 211 this AM  - took 1 dose of ozempic 6 days ago which resulted in constipation and he's due to take it again tomorrow; speak with PCP if constipation continues   Medication reviewed with patient.  Follow-up in 6 weeks, sooner if needed.

## 2022-07-14 NOTE — Progress Notes (Deleted)
2

## 2022-07-14 NOTE — Patient Instructions (Signed)
Continue weighing daily and call for an overnight weight gain of 3 pounds or more or a weekly weight gain of more than 5 pounds.  Continue to follow a low-sodium diet.

## 2022-07-31 ENCOUNTER — Inpatient Hospital Stay (HOSPITAL_COMMUNITY)
Admission: EM | Admit: 2022-07-31 | Discharge: 2022-08-08 | DRG: 314 | Disposition: A | Discharge status: Home / Self Care | Payer: Medicare HMO | Attending: Internal Medicine | Admitting: Internal Medicine

## 2022-07-31 ENCOUNTER — Other Ambulatory Visit (HOSPITAL_COMMUNITY): Payer: Medicare HMO

## 2022-07-31 ENCOUNTER — Emergency Department (HOSPITAL_COMMUNITY): Payer: Medicare HMO

## 2022-07-31 DIAGNOSIS — E1140 Type 2 diabetes mellitus with diabetic neuropathy, unspecified: Secondary | ICD-10-CM | POA: Diagnosis present

## 2022-07-31 DIAGNOSIS — R571 Hypovolemic shock: Secondary | ICD-10-CM | POA: Diagnosis not present

## 2022-07-31 DIAGNOSIS — J9611 Chronic respiratory failure with hypoxia: Secondary | ICD-10-CM | POA: Diagnosis present

## 2022-07-31 DIAGNOSIS — N189 Chronic kidney disease, unspecified: Secondary | ICD-10-CM | POA: Diagnosis present

## 2022-07-31 DIAGNOSIS — Z66 Do not resuscitate: Secondary | ICD-10-CM | POA: Diagnosis present

## 2022-07-31 DIAGNOSIS — Z794 Long term (current) use of insulin: Secondary | ICD-10-CM

## 2022-07-31 DIAGNOSIS — L89321 Pressure ulcer of left buttock, stage 1: Secondary | ICD-10-CM | POA: Diagnosis present

## 2022-07-31 DIAGNOSIS — E878 Other disorders of electrolyte and fluid balance, not elsewhere classified: Secondary | ICD-10-CM | POA: Diagnosis not present

## 2022-07-31 DIAGNOSIS — N1831 Chronic kidney disease, stage 3a: Secondary | ICD-10-CM | POA: Diagnosis present

## 2022-07-31 DIAGNOSIS — I9589 Other hypotension: Principal | ICD-10-CM | POA: Diagnosis present

## 2022-07-31 DIAGNOSIS — R531 Weakness: Secondary | ICD-10-CM | POA: Diagnosis not present

## 2022-07-31 DIAGNOSIS — E860 Dehydration: Secondary | ICD-10-CM | POA: Diagnosis present

## 2022-07-31 DIAGNOSIS — G9341 Metabolic encephalopathy: Secondary | ICD-10-CM | POA: Diagnosis not present

## 2022-07-31 DIAGNOSIS — I130 Hypertensive heart and chronic kidney disease with heart failure and stage 1 through stage 4 chronic kidney disease, or unspecified chronic kidney disease: Secondary | ICD-10-CM | POA: Diagnosis present

## 2022-07-31 DIAGNOSIS — I214 Non-ST elevation (NSTEMI) myocardial infarction: Secondary | ICD-10-CM | POA: Diagnosis not present

## 2022-07-31 DIAGNOSIS — Z79899 Other long term (current) drug therapy: Secondary | ICD-10-CM | POA: Diagnosis not present

## 2022-07-31 DIAGNOSIS — Z1152 Encounter for screening for COVID-19: Secondary | ICD-10-CM

## 2022-07-31 DIAGNOSIS — I2489 Other forms of acute ischemic heart disease: Secondary | ICD-10-CM | POA: Diagnosis present

## 2022-07-31 DIAGNOSIS — E876 Hypokalemia: Secondary | ICD-10-CM | POA: Diagnosis present

## 2022-07-31 DIAGNOSIS — Z91199 Patient's noncompliance with other medical treatment and regimen due to unspecified reason: Secondary | ICD-10-CM

## 2022-07-31 DIAGNOSIS — E871 Hypo-osmolality and hyponatremia: Secondary | ICD-10-CM | POA: Diagnosis present

## 2022-07-31 DIAGNOSIS — E1165 Type 2 diabetes mellitus with hyperglycemia: Secondary | ICD-10-CM | POA: Diagnosis present

## 2022-07-31 DIAGNOSIS — E1169 Type 2 diabetes mellitus with other specified complication: Secondary | ICD-10-CM | POA: Diagnosis present

## 2022-07-31 DIAGNOSIS — I5084 End stage heart failure: Secondary | ICD-10-CM | POA: Diagnosis present

## 2022-07-31 DIAGNOSIS — H9190 Unspecified hearing loss, unspecified ear: Secondary | ICD-10-CM | POA: Diagnosis present

## 2022-07-31 DIAGNOSIS — I2510 Atherosclerotic heart disease of native coronary artery without angina pectoris: Secondary | ICD-10-CM | POA: Diagnosis present

## 2022-07-31 DIAGNOSIS — L89311 Pressure ulcer of right buttock, stage 1: Secondary | ICD-10-CM | POA: Diagnosis present

## 2022-07-31 DIAGNOSIS — Z87891 Personal history of nicotine dependence: Secondary | ICD-10-CM

## 2022-07-31 DIAGNOSIS — R4189 Other symptoms and signs involving cognitive functions and awareness: Secondary | ICD-10-CM | POA: Diagnosis not present

## 2022-07-31 DIAGNOSIS — I493 Ventricular premature depolarization: Secondary | ICD-10-CM | POA: Diagnosis present

## 2022-07-31 DIAGNOSIS — Z9981 Dependence on supplemental oxygen: Secondary | ICD-10-CM

## 2022-07-31 DIAGNOSIS — L89159 Pressure ulcer of sacral region, unspecified stage: Secondary | ICD-10-CM | POA: Diagnosis present

## 2022-07-31 DIAGNOSIS — Z888 Allergy status to other drugs, medicaments and biological substances status: Secondary | ICD-10-CM

## 2022-07-31 DIAGNOSIS — Z885 Allergy status to narcotic agent status: Secondary | ICD-10-CM

## 2022-07-31 DIAGNOSIS — I5043 Acute on chronic combined systolic (congestive) and diastolic (congestive) heart failure: Secondary | ICD-10-CM | POA: Diagnosis present

## 2022-07-31 DIAGNOSIS — E1122 Type 2 diabetes mellitus with diabetic chronic kidney disease: Secondary | ICD-10-CM | POA: Diagnosis present

## 2022-07-31 DIAGNOSIS — M1990 Unspecified osteoarthritis, unspecified site: Secondary | ICD-10-CM | POA: Diagnosis present

## 2022-07-31 DIAGNOSIS — N179 Acute kidney failure, unspecified: Secondary | ICD-10-CM | POA: Diagnosis present

## 2022-07-31 DIAGNOSIS — Z7984 Long term (current) use of oral hypoglycemic drugs: Secondary | ICD-10-CM

## 2022-07-31 DIAGNOSIS — E785 Hyperlipidemia, unspecified: Secondary | ICD-10-CM | POA: Diagnosis present

## 2022-07-31 DIAGNOSIS — Z7982 Long term (current) use of aspirin: Secondary | ICD-10-CM

## 2022-07-31 DIAGNOSIS — I5042 Chronic combined systolic (congestive) and diastolic (congestive) heart failure: Secondary | ICD-10-CM | POA: Diagnosis not present

## 2022-07-31 DIAGNOSIS — I5023 Acute on chronic systolic (congestive) heart failure: Secondary | ICD-10-CM | POA: Diagnosis present

## 2022-07-31 DIAGNOSIS — Z833 Family history of diabetes mellitus: Secondary | ICD-10-CM

## 2022-07-31 DIAGNOSIS — I255 Ischemic cardiomyopathy: Secondary | ICD-10-CM | POA: Diagnosis present

## 2022-07-31 DIAGNOSIS — Z7902 Long term (current) use of antithrombotics/antiplatelets: Secondary | ICD-10-CM

## 2022-07-31 DIAGNOSIS — E861 Hypovolemia: Secondary | ICD-10-CM | POA: Diagnosis not present

## 2022-07-31 DIAGNOSIS — Z8249 Family history of ischemic heart disease and other diseases of the circulatory system: Secondary | ICD-10-CM

## 2022-07-31 DIAGNOSIS — T502X5A Adverse effect of carbonic-anhydrase inhibitors, benzothiadiazides and other diuretics, initial encounter: Secondary | ICD-10-CM | POA: Diagnosis present

## 2022-07-31 DIAGNOSIS — Z91013 Allergy to seafood: Secondary | ICD-10-CM

## 2022-07-31 DIAGNOSIS — I440 Atrioventricular block, first degree: Secondary | ICD-10-CM | POA: Diagnosis present

## 2022-07-31 LAB — I-STAT VENOUS BLOOD GAS, ED
Acid-Base Excess: 19 mmol/L — ABNORMAL HIGH (ref 0.0–2.0)
Bicarbonate: 45.5 mmol/L — ABNORMAL HIGH (ref 20.0–28.0)
Calcium, Ion: 0.95 mmol/L — ABNORMAL LOW (ref 1.15–1.40)
HCT: 56 % — ABNORMAL HIGH (ref 39.0–52.0)
Hemoglobin: 19 g/dL — ABNORMAL HIGH (ref 13.0–17.0)
O2 Saturation: 100 %
Potassium: 2.2 mmol/L — CL (ref 3.5–5.1)
Sodium: 117 mmol/L — CL (ref 135–145)
TCO2: 47 mmol/L — ABNORMAL HIGH (ref 22–32)
pCO2, Ven: 53 mmHg (ref 44–60)
pH, Ven: 7.542 — ABNORMAL HIGH (ref 7.25–7.43)
pO2, Ven: 166 mmHg — ABNORMAL HIGH (ref 32–45)

## 2022-07-31 LAB — CBC WITH DIFFERENTIAL/PLATELET
Abs Immature Granulocytes: 0.06 10*3/uL (ref 0.00–0.07)
Basophils Absolute: 0 10*3/uL (ref 0.0–0.1)
Basophils Relative: 0 %
Eosinophils Absolute: 0 10*3/uL (ref 0.0–0.5)
Eosinophils Relative: 0 %
HCT: 38.8 % — ABNORMAL LOW (ref 39.0–52.0)
Hemoglobin: 13.1 g/dL (ref 13.0–17.0)
Immature Granulocytes: 1 %
Lymphocytes Relative: 6 %
Lymphs Abs: 0.6 10*3/uL — ABNORMAL LOW (ref 0.7–4.0)
MCH: 27.8 pg (ref 26.0–34.0)
MCHC: 33.8 g/dL (ref 30.0–36.0)
MCV: 82.2 fL (ref 80.0–100.0)
Monocytes Absolute: 0.7 10*3/uL (ref 0.1–1.0)
Monocytes Relative: 6 %
Neutro Abs: 9.1 10*3/uL — ABNORMAL HIGH (ref 1.7–7.7)
Neutrophils Relative %: 87 %
Platelets: 215 10*3/uL (ref 150–400)
RBC: 4.72 MIL/uL (ref 4.22–5.81)
RDW: 16.1 % — ABNORMAL HIGH (ref 11.5–15.5)
WBC: 10.5 10*3/uL (ref 4.0–10.5)
nRBC: 0 % (ref 0.0–0.2)

## 2022-07-31 LAB — URINALYSIS, ROUTINE W REFLEX MICROSCOPIC
Bacteria, UA: NONE SEEN
Bilirubin Urine: NEGATIVE
Glucose, UA: >=500 mg/dL — AB
Hgb urine dipstick: NEGATIVE
Ketones, ur: NEGATIVE mg/dL
Leukocytes,Ua: NEGATIVE
Nitrite: NEGATIVE
Protein, ur: NEGATIVE mg/dL
Specific Gravity, Urine: 1.007 (ref 1.005–1.030)
pH: 6 (ref 5.0–8.0)

## 2022-07-31 LAB — COMPREHENSIVE METABOLIC PANEL
ALT: 19 U/L (ref 0–44)
AST: 36 U/L (ref 15–41)
Albumin: 3.5 g/dL (ref 3.5–5.0)
Alkaline Phosphatase: 75 U/L (ref 38–126)
Anion gap: 18 — ABNORMAL HIGH (ref 5–15)
BUN: 85 mg/dL — ABNORMAL HIGH (ref 8–23)
CO2: 35 mmol/L — ABNORMAL HIGH (ref 22–32)
Calcium: 9.3 mg/dL (ref 8.9–10.3)
Chloride: 67 mmol/L — ABNORMAL LOW (ref 98–111)
Creatinine, Ser: 1.42 mg/dL — ABNORMAL HIGH (ref 0.61–1.24)
GFR, Estimated: 50 mL/min — ABNORMAL LOW (ref >60)
Glucose, Bld: 423 mg/dL — ABNORMAL HIGH (ref 70–99)
Potassium: 2.5 mmol/L — CL (ref 3.5–5.1)
Sodium: 120 mmol/L — ABNORMAL LOW (ref 135–145)
Total Bilirubin: 1.2 mg/dL (ref 0.3–1.2)
Total Protein: 7.3 g/dL (ref 6.5–8.1)

## 2022-07-31 LAB — BETA-HYDROXYBUTYRIC ACID: Beta-Hydroxybutyric Acid: 0.31 mmol/L — ABNORMAL HIGH (ref 0.05–0.27)

## 2022-07-31 LAB — TROPONIN I (HIGH SENSITIVITY)
Troponin I (High Sensitivity): 1351 ng/L (ref <18)
Troponin I (High Sensitivity): 1243 ng/L (ref <18)

## 2022-07-31 LAB — RESP PANEL BY RT-PCR (FLU A&B, COVID) ARPGX2
Influenza A by PCR: NEGATIVE
Influenza B by PCR: NEGATIVE
SARS Coronavirus 2 by RT PCR: NEGATIVE

## 2022-07-31 LAB — CBG MONITORING, ED: Glucose-Capillary: 434 mg/dL — ABNORMAL HIGH (ref 70–99)

## 2022-07-31 LAB — LACTIC ACID, PLASMA: Lactic Acid, Venous: 1.6 mmol/L (ref 0.5–1.9)

## 2022-07-31 LAB — APTT: aPTT: 28 seconds (ref 24–36)

## 2022-07-31 LAB — BRAIN NATRIURETIC PEPTIDE: B Natriuretic Peptide: 3368.9 pg/mL — ABNORMAL HIGH (ref 0.0–100.0)

## 2022-07-31 LAB — MAGNESIUM: Magnesium: 2.7 mg/dL — ABNORMAL HIGH (ref 1.7–2.4)

## 2022-07-31 MED ORDER — ASPIRIN 81 MG PO TBEC
81.0000 mg | DELAYED_RELEASE_TABLET | Freq: Every day | ORAL | Status: DC
  Administered 2022-08-01 – 2022-08-08 (×8): 81 mg via ORAL
  Filled 2022-07-31 (×8): qty 1

## 2022-07-31 MED ORDER — MIDODRINE HCL 5 MG PO TABS: 5.0000 mg | ORAL_TABLET | Freq: Once | ORAL | Status: DC

## 2022-07-31 MED ORDER — HEPARIN BOLUS VIA INFUSION
4000.0000 [IU] | Freq: Once | INTRAVENOUS | Status: AC
  Administered 2022-07-31: 4000 [IU] via INTRAVENOUS
  Filled 2022-07-31: qty 4000

## 2022-07-31 MED ORDER — POTASSIUM CHLORIDE 10 MEQ/100ML IV SOLN
10.0000 meq | INTRAVENOUS | Status: AC
  Administered 2022-07-31: 10 meq via INTRAVENOUS
  Filled 2022-07-31 (×2): qty 100

## 2022-07-31 MED ORDER — POTASSIUM CHLORIDE CRYS ER 20 MEQ PO TBCR
20.0000 meq | EXTENDED_RELEASE_TABLET | Freq: Once | ORAL | Status: AC
  Administered 2022-07-31: 20 meq via ORAL
  Filled 2022-07-31: qty 1

## 2022-07-31 MED ORDER — POTASSIUM CHLORIDE 10 MEQ/100ML IV SOLN
10.0000 meq | INTRAVENOUS | Status: AC
  Administered 2022-07-31 (×2): 10 meq via INTRAVENOUS
  Filled 2022-07-31: qty 100

## 2022-07-31 MED ORDER — HEPARIN (PORCINE) 25000 UT/250ML-% IV SOLN
1150.0000 [IU]/h | INTRAVENOUS | Status: DC
  Administered 2022-07-31: 1000 [IU]/h via INTRAVENOUS
  Filled 2022-07-31: qty 250

## 2022-07-31 MED ORDER — TRAZODONE HCL 50 MG PO TABS
25.0000 mg | ORAL_TABLET | Freq: Every evening | ORAL | Status: DC | PRN
  Administered 2022-08-05: 25 mg via ORAL
  Filled 2022-07-31: qty 1

## 2022-07-31 MED ORDER — DAPAGLIFLOZIN PROPANEDIOL 10 MG PO TABS
10.0000 mg | ORAL_TABLET | Freq: Every day | ORAL | Status: DC
  Administered 2022-08-01: 10 mg via ORAL
  Filled 2022-07-31: qty 1

## 2022-07-31 MED ORDER — CALCIUM CARBONATE ANTACID 500 MG PO CHEW: 1.0000 | CHEWABLE_TABLET | Freq: Three times a day (TID) | ORAL | Status: DC | PRN

## 2022-07-31 MED ORDER — POTASSIUM CHLORIDE CRYS ER 20 MEQ PO TBCR
40.0000 meq | EXTENDED_RELEASE_TABLET | Freq: Two times a day (BID) | ORAL | Status: DC
  Administered 2022-07-31 – 2022-08-02 (×4): 40 meq via ORAL
  Filled 2022-07-31 (×4): qty 2

## 2022-07-31 MED ORDER — CLOPIDOGREL BISULFATE 75 MG PO TABS
75.0000 mg | ORAL_TABLET | Freq: Every day | ORAL | Status: DC
  Administered 2022-08-01 – 2022-08-08 (×8): 75 mg via ORAL
  Filled 2022-07-31 (×8): qty 1

## 2022-07-31 MED ORDER — ACETAMINOPHEN 325 MG PO TABS
650.0000 mg | ORAL_TABLET | ORAL | Status: DC | PRN
  Administered 2022-08-04 – 2022-08-08 (×6): 650 mg via ORAL
  Filled 2022-07-31 (×6): qty 2

## 2022-07-31 MED ORDER — ROSUVASTATIN CALCIUM 20 MG PO TABS
20.0000 mg | ORAL_TABLET | Freq: Every day | ORAL | Status: DC
  Administered 2022-08-01 – 2022-08-07 (×7): 20 mg via ORAL
  Filled 2022-07-31 (×7): qty 1

## 2022-07-31 MED ORDER — LACTATED RINGERS IV BOLUS
500.0000 mL | Freq: Once | INTRAVENOUS | Status: AC
  Administered 2022-07-31: 500 mL via INTRAVENOUS

## 2022-07-31 MED ORDER — SODIUM CHLORIDE 0.9 % IV BOLUS
500.0000 mL | Freq: Once | INTRAVENOUS | Status: AC
  Administered 2022-07-31: 500 mL via INTRAVENOUS

## 2022-07-31 MED ORDER — NITROGLYCERIN 0.4 MG SL SUBL: 0.4000 mg | SUBLINGUAL_TABLET | SUBLINGUAL | Status: DC | PRN

## 2022-07-31 MED ORDER — ASPIRIN 81 MG PO TBEC: 81.0000 mg | DELAYED_RELEASE_TABLET | Freq: Every day | ORAL | Status: DC

## 2022-07-31 MED ORDER — GLIPIZIDE ER 10 MG PO TB24
10.0000 mg | ORAL_TABLET | Freq: Every day | ORAL | Status: DC
  Filled 2022-07-31: qty 1

## 2022-07-31 MED ORDER — MIDODRINE HCL 5 MG PO TABS
10.0000 mg | ORAL_TABLET | Freq: Three times a day (TID) | ORAL | Status: DC
  Administered 2022-07-31 – 2022-08-03 (×10): 10 mg via ORAL
  Filled 2022-07-31 (×10): qty 2

## 2022-07-31 MED ORDER — ALPRAZOLAM 0.25 MG PO TABS: 0.2500 mg | ORAL_TABLET | Freq: Two times a day (BID) | ORAL | Status: DC | PRN

## 2022-07-31 MED ORDER — SENNOSIDES-DOCUSATE SODIUM 8.6-50 MG PO TABS: 2.0000 | ORAL_TABLET | Freq: Two times a day (BID) | ORAL | Status: DC | PRN

## 2022-07-31 MED ORDER — ONDANSETRON HCL 4 MG/2ML IJ SOLN: 4.0000 mg | Freq: Four times a day (QID) | INTRAMUSCULAR | Status: DC | PRN

## 2022-07-31 MED ORDER — MIDODRINE HCL 5 MG PO TABS: 5.0000 mg | ORAL_TABLET | Freq: Three times a day (TID) | ORAL | Status: DC

## 2022-07-31 MED ORDER — ASPIRIN 81 MG PO CHEW
324.0000 mg | CHEWABLE_TABLET | ORAL | Status: AC
  Administered 2022-07-31: 324 mg via ORAL
  Filled 2022-07-31: qty 4

## 2022-07-31 MED ORDER — ASPIRIN 300 MG RE SUPP: 300.0000 mg | RECTAL | Status: AC

## 2022-07-31 NOTE — Assessment & Plan Note (Addendum)
CKD stage 3a, Hyponatremia, hypokalemia.  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  Renal hypoperfusion. ATN   Patient with progressive worsening renal function, his serum cr has doubled from 2,49 to 3.39, with K at 5.7 and serum bicarbonate at 21. His BUN is 109. Patient with poor prognosis with end stage heart failure, not candidate for renal replacement therapy. Plan to add sodium zirconium this morning and resume furosemide.  Patient will be discharged home with hospice services today.

## 2022-07-31 NOTE — Assessment & Plan Note (Signed)
-   We will continue high-dose statin therapy with therapy and check fasting lipids in AM.

## 2022-07-31 NOTE — Assessment & Plan Note (Addendum)
Echocardiogram with worsening LV systolic function EF < 75%, with global hypokinesis. RV systolic function with moderate reduction. RVSP 39,0. Left atrium with moderate dilatation. Moderate mitral regurgitation.   Patient has received IV fluids with transitory improvement on renal function.  Today with positive hypoperfusion with cold lower extremities and worsening renal function.   Plan to continue with high dose of midodrine Add IV albumin   Limited pharmacologic options due to hypotension and worsening renal function.   Troponin elevation due to heart failure decompensation, no signs of acute coronary syndrome, NSTEMI has been ruled out.

## 2022-07-31 NOTE — ED Triage Notes (Signed)
Pt. Complains of weakness/ dizziness and fell yesterday. Patient states he normally walks with walker at home and is not normal for him to be weak. Patient endorses bilateral leg swelling and wounds which he was seen for in the hospital In June. Patient alert and oriented.

## 2022-07-31 NOTE — Assessment & Plan Note (Addendum)
Uncontrolled with Hyperglycemia.  Glucose has been stable, this am fasting 140 mg/dl  Plan to continue glucose cover and monitoring with insulin sliding scale.  Continue with statin therapy.

## 2022-07-31 NOTE — ED Provider Notes (Signed)
[facility] EMERGENCY DEPARTMENT Provider Note   CSN: [id number] Arrival date & time: 07/31/22  1508     History  Chief Complaint  Patient presents with   Weakness    JUDITH KEMPE is a 81 y.o. male.  HPI   Patient with medical history including CAD, chronic systolic congestive heart failure echo 20 to 30%, hypertension, hyperlipidemia, type 2 diabetes presents with complaints of generalized weakness, going on for last 2 days, states he has generalized weakness, not focalized, he denies any chest pain shortness of breath stomach pains nausea vomiting diarrhea no cough or congestion no URI-like symptoms.  No recent falls, not anticoag's, been compliant with all of his medications, still having good urinary production, states that he has lost about 20 pounds over the last 3 weeks, does not know what his ideal weight is supposed to be.   Reviewed patient's chart was recently admitted for acute on chronic congestive heart failure, was diuresed, stabilized, discharged back on his home diuresis medications, and start him on midodrine as needed for low BP.  Home Medications Prior to Admission medications   Medication Sig Start Date End Date Taking? Authorizing Provider  aspirin EC 81 MG EC tablet Take 1 tablet (81 mg total) by mouth daily. 03/19/20   Ezekiel Slocumb, DO  calcium carbonate (TUMS - DOSED IN MG ELEMENTAL CALCIUM) 500 MG chewable tablet Chew 1 tablet (200 mg of elemental calcium total) by mouth 3 (three) times daily as needed for indigestion or heartburn. 03/18/20   Ezekiel Slocumb, DO  clopidogrel (PLAVIX) 75 MG tablet Take 75 mg by mouth daily. 07/08/21   [provider]  dapagliflozin propanediol (FARXIGA) 5 MG TABS tablet Take 10 mg by mouth daily.    [provider]  glipiZIDE (GLUCOTROL XL) 10 MG 24 hr tablet Take 10 mg by mouth daily. 10/15/21   [provider]  midodrine (PROAMATINE) 5 MG tablet Take 1 tablet (5 mg total) by  mouth 3 (three) times daily with meals. Patient not taking: Reported on 07/14/2022 05/09/22   Emeterio Reeve, DO  nitroGLYCERIN (NITROSTAT) 0.4 MG SL tablet Place 0.4 mg under the tongue every 5 (five) minutes as needed for chest pain. Patient not taking: Reported on 07/14/2022    [provider]  [facility] ULTRA test strip Use to check blood sugar up to 2 x daily 06/29/20   Olin Hauser, DO  rosuvastatin (CRESTOR) 20 MG tablet Take 20 mg by mouth at bedtime. 10/27/21   [provider]  senna-docusate (SENOKOT-S) 8.6-50 MG tablet Take 2 tablets by mouth 2 (two) times daily as needed for constipation. 03/27/20   [provider]  torsemide (DEMADEX) 10 MG tablet Take 5 tablets (50 mg total) by mouth daily. 05/10/22   Emeterio Reeve, DO      Allergies    Empagliflozin, Prednisone, Shrimp extract allergy skin test, Atorvastatin, Codeine, Dust mite extract, and Shrimp [shellfish allergy]    Review of Systems   Review of Systems  Constitutional:  Negative for chills and fever.  Respiratory:  Negative for shortness of breath.   Cardiovascular:  Negative for chest pain.  Gastrointestinal:  Negative for abdominal pain.  Neurological:  Positive for weakness. Negative for headaches.    Physical Exam Updated Vital Signs BP 91/61   Pulse 72   Temp 98.1 F (36.7 C) (Oral)   Resp (!) 22   SpO2 100%  Physical Exam Vitals and nursing note reviewed.  Constitutional:  General: He is not in acute distress.    Appearance: He is not ill-appearing.     Comments: No acute distress but hypotensive during my exam.  HENT:     Head: Normocephalic and atraumatic.     Nose: No congestion.     Mouth/Throat:     Mouth: Mucous membranes are dry.     Pharynx: Oropharynx is clear.  Eyes:     Conjunctiva/sclera: Conjunctivae normal.  Cardiovascular:     Rate and Rhythm: Normal rate and regular rhythm.     Pulses: Normal pulses.     Heart sounds: No murmur  heard.    No friction rub. No gallop.  Pulmonary:     Effort: No respiratory distress.     Breath sounds: No wheezing, rhonchi or rales.     Comments: No evidence of rest or distress nontachypneic nonhypoxic, he has noted Rales heard in the lower lobes mainly on the left side without wheezing or rhonchi present. Abdominal:     Palpations: Abdomen is soft.     Tenderness: There is no abdominal tenderness. There is no right CVA tenderness or left CVA tenderness.  Musculoskeletal:     Right lower leg: Edema present.     Left lower leg: Edema present.     Comments: Patient has slight edema noted on his legs bilaterally, 1+ pitting, he has 1+ dorsal pedal pulses bilaterally.  Patient is noted small ulcerations on his legs as well as feet, there is slight erythema present, the largest 1 is on his right anterior distal leg, there is some discharge present, no fluctuance or duration present during my exam.  Skin:    General: Skin is warm and dry.  Neurological:     Mental Status: He is alert.     GCS: GCS eye subscore is 4. GCS verbal subscore is 5. GCS motor subscore is 6.     Cranial Nerves: Cranial nerves 2-12 are intact.     Motor: No weakness.     Coordination: Romberg sign negative. Finger-Nose-Finger Test and Heel to [place] Test normal.     Comments: Cranial nerves II through XII grossly intact no difficulty with word finding following two-step commands no unilateral weakness present.  Alert and orient x4  Psychiatric:        Mood and Affect: Mood normal.     ED Results / Procedures / Treatments   Labs (all labs ordered are listed, but only abnormal results are displayed) Labs Reviewed  COMPREHENSIVE METABOLIC PANEL - Abnormal; Notable for the following components:      Result Value   Sodium 120 (*)    Potassium 2.5 (*)    Chloride 67 (*)    CO2 35 (*)    Glucose, Bld 423 (*)    BUN 85 (*)    Creatinine, Ser 1.42 (*)    GFR, Estimated 50 (*)    Anion gap 18 (*)    All other  components within normal limits  CBC WITH DIFFERENTIAL/PLATELET - Abnormal; Notable for the following components:   HCT 38.8 (*)    RDW 16.1 (*)    Neutro Abs 9.1 (*)    Lymphs Abs 0.6 (*)    All other components within normal limits  BRAIN NATRIURETIC PEPTIDE - Abnormal; Notable for the following components:   B Natriuretic Peptide 3,368.9 (*)    All other components within normal limits  MAGNESIUM - Abnormal; Notable for the following components:   Magnesium 2.7 (*)  All other components within normal limits  TROPONIN I (HIGH SENSITIVITY) - Abnormal; Notable for the following components:   Troponin I (High Sensitivity) 1,351 (*)    All other components within normal limits  RESP PANEL BY RT-PCR (FLU A&B, COVID) ARPGX2  URINALYSIS, ROUTINE W REFLEX MICROSCOPIC  LACTIC ACID, PLASMA  LACTIC ACID, PLASMA  BETA-HYDROXYBUTYRIC ACID  BLOOD GAS, VENOUS  TROPONIN I (HIGH SENSITIVITY)    EKG   Radiology DG Chest Port 1 View  Result Date: 07/31/2022 CLINICAL DATA:  Weakness, dizziness, fell yesterday, short of breath EXAM: PORTABLE CHEST 1 VIEW COMPARISON:  05/08/2022 FINDINGS: Single frontal view of the chest demonstrates an enlarged cardiac silhouette. Mild central vascular congestion without overt edema. No airspace disease, effusion, or pneumothorax. No acute bony abnormalities. IMPRESSION: 1. Stable enlarged cardiac silhouette. 2. Mild central vascular congestion without overt edema. Electronically Signed   By: Sharlet Salina M.D.   On: 07/31/2022 17:58    Procedures .Critical Care  Performed by: Carroll Sage, PA-C Authorized by: Carroll Sage, PA-C   Critical care provider statement:    Critical care time (minutes):  30   Critical care time was exclusive of:  Separately billable procedures and treating other patients   Critical care was necessary to treat or prevent imminent or life-threatening deterioration of the following conditions:  Circulatory failure and  metabolic crisis   Critical care was time spent personally by me on the following activities:  Development of treatment plan with patient or surrogate, discussions with consultants, evaluation of patient's response to treatment, examination of patient, ordering and review of laboratory studies, ordering and review of radiographic studies, ordering and performing treatments and interventions, pulse oximetry, re-evaluation of patient's condition and review of old charts   I assumed direction of critical care for this patient from another provider in my specialty: no       Medications Ordered in ED Medications  midodrine (PROAMATINE) tablet 5 mg (5 mg Oral Not Given 07/31/22 1715)  potassium chloride 10 mEq in 100 mL IVPB (has no administration in time range)  potassium chloride SA (KLOR-CON M) CR tablet 20 mEq (has no administration in time range)  sodium chloride 0.9 % bolus 500 mL (has no administration in time range)    ED Course/ Medical Decision Making/ A&P                           Medical Decision Making Amount and/or Complexity of Data Reviewed Labs: ordered.  Risk Prescription drug management.   This patient presents to the ED for concern of weakness, this involves an extensive number of treatment options, and is a complaint that carries with it a high risk of complications and morbidity.  The differential diagnosis includes sepsis, CVA, ACS, PE, CHF exacerbation    Additional history obtained:  Additional history obtained from daughter at bedside External records from outside source obtained and reviewed including cardiology notes, recent discharge summary   Co morbidities that complicate the patient evaluation  CHF, CAD, diabetes  Social Determinants of Health:  N/A    Lab Tests:  I Ordered, and personally interpreted labs.  The pertinent results include: CBC unremarkable, CMP shows sodium 120 and potassium 2.5 CO2 35, glucose 423 BUN 85 creatinine 1.4 and gap  18, first troponin is 1351, BNP is 3368   Imaging Studies ordered:  I ordered imaging studies including chest x-ray I independently visualized and interpreted imaging which showed negative acute  changes I agree with the radiologist interpretation   Cardiac Monitoring:  The patient was maintained on a cardiac monitor.  I personally viewed and interpreted the cardiac monitored which showed an underlying rhythm of: EKG without signs of ischemia   Medicines ordered and prescription drug management:  I ordered medication including midodrine I have reviewed the patients home medicines and have made adjustments as needed  Critical Interventions:  Hypotensive on exam we will start her on fluids Reassess BP has improved, will continue to monitor.   Reevaluation:  Patient has a noted elevated troponin as well as an elevated BNP, I suspect she is likely worsening congestive heart failure, will consult with cardiology for further recommendations.  Appreciate cardiology recommendations we will provide patient with 500 cc of fluids, started on IV potassium, oral potassium, will also add on beta hydroxybutyrate as well as VBG for further evaluation possible DKA.  Consultations Obtained:  I requested consultation with the cardiologist Dr. Radford Pax,  and discussed lab and imaging findings as well as pertinent plan - they recommend: evaluate the patient, feels that patient is likely over diuresed, would recommend gentle fluid hydration, correcting electrolytes, and admission to medicine team.    Test Considered:  N/A    Rule out I have low suspicion for ACS as history is atypical, EKG was sinus rhythm without signs of ischemia, he does have an elevated troponin but I suspect this is all in the context of demand ischemia from acute heart failure.  I have low suspicion patient CHF exacerbation is from volume overload as he appears dry on my exam, there is no worsening leg swelling, no  significant mount of edema seen on chest x-ray, this also correlates with CMP which reveals hyponatremia elevated BUN worsening creatinine.  Low suspicion for PE as patient denies pleuritic chest pain, shortness of breath, patient denies leg pain, no pedal edema noted on exam, vital signs reassuring nontachypneic nonhypoxic nontachycardic low suspicion for AAA or aortic dissection as history is atypical, patient has low risk factors.  Suspicion for sepsis is also low at this time as he is nontoxic-appearing, vital signs reassuring, afebrile no leukocytosis, again he is hypotensive but I suspect this is all secondary due to dehydration.   Dispostion and problem list  Due to shift change patient handoff to Dr. Billy Fischer  Follow-up on remaining lab work, will need admission to hospitalist team, treated for DKA I suspect this will correct with fluid resuscitation, if worsening delta troponins would consider reconsulting with cardiology but again I feel this will resolve with gentle rehydration.            Final Clinical Impression(s) / ED Diagnoses Final diagnoses:  Dehydration  Electrolyte abnormality    Rx / DC Orders ED Discharge Orders     None         Marcello Fennel, PA-C 07/31/22 1912    Gareth Morgan, MD 08/01/22 1119

## 2022-07-31 NOTE — Assessment & Plan Note (Signed)
-   The patient has significantly elevated troponin I though it is coming down. - He could certainly be having myocardial injury however denied any chest pain. - She has chronic coronary artery disease with three-vessel disease with ostial LAD, circumflex and RCA lesions, not amenable to PCI or surgery. - We will continue aspirin and Plavix, Crestor and hold off beta-blocker therapy or ACE inhibitor therapy due to his chronic hypotension. - I will start her on IV heparin for now and continue monitoring as I am concerned about the fatigue and tiredness as well as the dyspnea being the major symptoms of MI in the setting of  diabetic neuropathy.

## 2022-07-31 NOTE — Assessment & Plan Note (Signed)
-   The patient will be admitted to a stepdown unit bed. - This generalized weakness could be multifactorial. - It could be related to volume depletion and dehydration from diuresis. - The patient may be having non-STEMI that is contributing to his fatigue and weakness with subsequent acute on chronic systolic and diastolic CHF. - Echo also hypokalemia could be a major contributing factor to his fatigue and weakness as well as significant hyperglycemia with uncontrolled type 2 diabetes mellitus. - Management as low.

## 2022-07-31 NOTE — H&P (Addendum)
[place]   PATIENT NAME: Aaron Sosa    MR#:  [id number]  DATE OF BIRTH:  1940/12/12  DATE OF ADMISSION:  07/31/2022  PRIMARY CARE PHYSICIAN: Kirk Ruths, MD   Patient is coming from: Home  REQUESTING/REFERRING PHYSICIAN: Gareth Morgan, MD  CHIEF COMPLAINT:   Chief Complaint  Patient presents with   Weakness    HISTORY OF PRESENT ILLNESS:  Aaron Sosa is a 81 y.o. 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  He denied any chest pain or palpitations, dyspnea or cough or wheezing.  No nausea or vomiting or abdominal pain.  No recent traumas or falls.  He stated he lost about 20 pounds over the last 3 weeks.  He has been compliant with his medications including his diuretic therapy.  He was recently admitted here for acute on chronic CHF and was diuresed and stabilized.  He was hypotensive and was started on midodrine.  No fever or chills.  No bleeding diathesis.  He has bilateral leg wounds from accidental trauma.  ED Course: When he came to the ER, BP was initially 79/46 with a MAP of 58 and later 84/63 with a MAP of 71 and after gentle hydration and it went up to 93/69 with a MAP of 78.  Vital signs otherwise were within normal.  His VBG showed pH 7.54 with a PCO2 of 53 a PO2 166 and HCO3 of 45.5.  As sodium was 120 and later 117 potassium 2.5 and later 2.2 with chloride of 67 and CO2 of 35, glucose of 423 BUN of 85 and creatinine 1.42 compared to 58/1.73 on 05/09/2022.  Ionized calcium was 0.95 and magnesium was 2.7.  LFTs were within normal.  BN P was elevated at 3368.9 and high sensitive troponin I was 1351 and later 12.43.  Lactic acid was 1.6.  CBC showed no  significant abnormalities.  Repeat hemoglobin showed hemoconcentration.  Beta hydroxybutyrate was 0.31 EKG as reviewed by me : EKG initially showed frequent PVCs in a trigeminy pattern as well as bigeminy with first-degree AV block right axis deviation and QTc intervals of 643 MS repeat EKG showed nonspecific intraventricular conduction delay with frequent PVCs latest EKG showed nonspecific intraventricular conduction delay and PVCs with Q waves inferiorly and likely accelerated junctional rhythm in a separate optimal tracing. Imaging: Portable chest x-ray showed stable enlarged cardiac silhouette and mild central vascular congestion without overt edema.  Dr. Acie Fredrickson was notified about the patient and evaluated him in the ER who recommended continuing aspirin, Plavix and Crestor and considering IV heparin especially if chest pain occurs.  He recommended midodrine for hypotension and holding off diuretic as the patient seems to be clinically dehydrated and specially in the setting of hypotension.  The patient will be admitted to progressive unit bed for further evaluation and management. PAST MEDICAL HISTORY:   Past Medical History:  Diagnosis Date   Arthritis    CHF (congestive heart failure) ([place])    Chronic kidney disease    Coronary artery disease    Diabetes mellitus without complication (HCC)    HOH (hard of hearing)    Lower extremity edema     PAST SURGICAL HISTORY:  Past Surgical History:  Procedure Laterality Date   CATARACT EXTRACTION Left    CATARACT EXTRACTION W/PHACO Right 05/06/2015   Procedure: CATARACT EXTRACTION PHACO AND INTRAOCULAR LENS PLACEMENT (IOC);  Surgeon: Lyla Glassing, MD;  Location: [facility] ORS;  Service: Ophthalmology;  Laterality: Right;  [place] 1:14.7         AP   11.2          CDE   8.43    cassette lot [id number]   RETINAL DETACHMENT SURGERY     RIGHT/LEFT HEART CATH AND CORONARY ANGIOGRAPHY N/A 03/17/2020   Procedure: RIGHT/LEFT HEART CATH AND CORONARY  ANGIOGRAPHY;  Surgeon: Corey Skains, MD;  Location: [facility];  Service: Cardiovascular;  Laterality: N/A;   WRIST FRACTURE SURGERY  1958    SOCIAL HISTORY:   Social History   Tobacco Use   Smoking status: Former    Packs/day: 2.00    Years: 5.00    Total pack years: 10.00    Types: Cigarettes   Smokeless tobacco: Former  Substance Use Topics   Alcohol use: Yes    Alcohol/week: 1.0 standard drink of alcohol    Types: 1 Cans of beer per week    FAMILY HISTORY:   Family History  Problem Relation Age of Onset   Heart attack Mother    Diabetes Mellitus II Mother    Heart attack Father     DRUG ALLERGIES:   Allergies  Allergen Reactions   Empagliflozin Other (See Comments)    "Made him hurt all over"   Prednisone Other (See Comments) and Swelling    Fluid build up    Shrimp Extract Allergy Skin Test Anaphylaxis   Atorvastatin Other (See Comments)    myalgia Other reaction(s): Unknown myalgia   Codeine     Other reaction(s): Unknown Makes him feel crazy   Dust Mite Extract     REVIEW OF SYSTEMS:   ROS As per history of present illness. All pertinent systems were reviewed above. Constitutional, HEENT, cardiovascular, respiratory, GI, GU, musculoskeletal, neuro, psychiatric, endocrine, integumentary and hematologic systems were reviewed and are otherwise negative/unremarkable except for positive findings mentioned above in the HPI.   MEDICATIONS AT HOME:   Prior to Admission medications   Medication Sig Start Date End Date Taking? Authorizing Provider  acetaminophen (TYLENOL) 325 MG tablet Take 325 mg by mouth every 6 (six) hours as needed for mild pain or moderate pain.   Yes [provider]  aspirin EC 81 MG EC tablet Take 1 tablet (81 mg total) by mouth daily. 03/19/20  Yes Nicole Kindred A, DO  calcium carbonate (TUMS - DOSED IN MG ELEMENTAL CALCIUM) 500 MG chewable tablet Chew 1 tablet (200 mg of elemental calcium total) by mouth 3  (three) times daily as needed for indigestion or heartburn. 03/18/20   Ezekiel Slocumb, DO  clopidogrel (PLAVIX) 75 MG tablet Take 75 mg by mouth daily. 07/08/21   [provider]  dapagliflozin propanediol (FARXIGA) 5 MG TABS tablet Take 10 mg by mouth daily.    [provider]  glipiZIDE (GLUCOTROL XL) 10 MG 24 hr tablet Take 10 mg by mouth daily. 10/15/21   [provider]  midodrine (PROAMATINE) 5 MG tablet Take 1 tablet (5 mg total) by mouth 3 (three) times daily with meals. Patient not taking: Reported on 07/14/2022 05/09/22   Emeterio Reeve, DO  nitroGLYCERIN (NITROSTAT) 0.4 MG SL tablet Place 0.4 mg under the tongue every 5 (five) minutes as needed for chest  pain. Patient not taking: Reported on 07/14/2022    [provider]  [facility] ULTRA test strip Use to check blood sugar up to 2 x daily 06/29/20   Karamalegos, Devonne Doughty, DO  rosuvastatin (CRESTOR) 20 MG tablet Take 20 mg by mouth at bedtime. 10/27/21   [provider]  senna-docusate (SENOKOT-S) 8.6-50 MG tablet Take 2 tablets by mouth 2 (two) times daily as needed for constipation. 03/27/20   [provider]  torsemide (DEMADEX) 10 MG tablet Take 5 tablets (50 mg total) by mouth daily. 05/10/22   Emeterio Reeve, DO      VITAL SIGNS:  Blood pressure 93/69, pulse 86, temperature 98.1 F (36.7 C), temperature source Oral, resp. rate 19, height 5' 7.84" (1.723 m), weight 82.9 kg, SpO2 94 %.  PHYSICAL EXAMINATION:  Physical Exam  GENERAL:  81 y.o.-year-old male patient lying in the bed with no acute distress.  EYES: Pupils equal, round, reactive to light and accommodation. No scleral icterus. Extraocular muscles intact.  HEENT: Head atraumatic, normocephalic. Oropharynx and nasopharynx clear.  NECK:  Supple, no jugular venous distention. No thyroid enlargement, no tenderness.  LUNGS: Slightly diminished bibasal breath sounds.  No use of accessory muscles of respiration.   CARDIOVASCULAR: Regular rate and rhythm, S1, S2 normal. No murmurs, rubs, or gallops.  ABDOMEN: Soft, nondistended, nontender. Bowel sounds present. No organomegaly or mass.  EXTREMITIES: No pedal edema, cyanosis, or clubbing.  NEUROLOGIC: Cranial nerves II through XII are intact. Muscle strength 5/5 in all extremities. Sensation intact. Gait not checked.  PSYCHIATRIC: The patient is alert and oriented x 3.  Normal affect and good eye contact. SKIN: He has bilateral leg ulcerated wounds with no surrounding induration or erythema to suggest cellulitis.Marland Kitchen   LABORATORY PANEL:   CBC Recent Labs  Lab 07/31/22 1600 07/31/22 2055  WBC 10.5  --   HGB 13.1 19.0*  HCT 38.8* 56.0*  PLT 215  --    ------------------------------------------------------------------------------------------------------------------  Chemistries  Recent Labs  Lab 07/31/22 1600 07/31/22 1800 07/31/22 2055  NA 120*  --  117*  K 2.5*  --  2.2*  CL 67*  --   --   CO2 35*  --   --   GLUCOSE 423*  --   --   BUN 85*  --   --   CREATININE 1.42*  --   --   CALCIUM 9.3  --   --   MG  --  2.7*  --   AST 36  --   --   ALT 19  --   --   ALKPHOS 75  --   --   BILITOT 1.2  --   --    ------------------------------------------------------------------------------------------------------------------  Cardiac Enzymes No results for input(s): "TROPONINI" in the last 168 hours. ------------------------------------------------------------------------------------------------------------------  RADIOLOGY:  DG Chest Port 1 View  Result Date: 07/31/2022 CLINICAL DATA:  Weakness, dizziness, fell yesterday, short of breath EXAM: PORTABLE CHEST 1 VIEW COMPARISON:  05/08/2022 FINDINGS: Single frontal view of the chest demonstrates an enlarged cardiac silhouette. Mild central vascular congestion without overt edema. No airspace disease, effusion, or pneumothorax. No acute bony abnormalities. IMPRESSION: 1. Stable enlarged cardiac  silhouette. 2. Mild central vascular congestion without overt edema. Electronically Signed   By: Randa Ngo M.D.   On: 07/31/2022 17:58      IMPRESSION AND PLAN:  Assessment and Plan: * Generalized weakness - The patient will be admitted to a stepdown unit bed. - This generalized weakness could be multifactorial. -  It could be related to volume depletion and dehydration from diuresis. - The patient may be having non-STEMI that is contributing to his fatigue and weakness with subsequent acute on chronic systolic and diastolic CHF. - Echo also hypokalemia could be a major contributing factor to his fatigue and weakness as well as significant hyperglycemia with uncontrolled type 2 diabetes mellitus. - Management as low.  Hypokalemia - Aggressive potassium replacement will be provided.  Magnesium level came back actually elevated at 2.7.  Non-STEMI (non-ST elevated myocardial infarction) ([place]) - The patient has significantly elevated troponin I though it is coming down. - He could certainly be having myocardial injury however denied any chest pain. - She has chronic coronary artery disease with three-vessel disease with ostial LAD, circumflex and RCA lesions, not amenable to PCI or surgery. - We will continue aspirin and Plavix, Crestor and hold off beta-blocker therapy or ACE inhibitor therapy due to his chronic hypotension. - I will start her on IV heparin for now and continue monitoring as I am concerned about the fatigue and tiredness as well as the dyspnea being the major symptoms of MI in the setting of  diabetic neuropathy.  Acute on chronic combined systolic and diastolic CHF (congestive heart failure) (HCC) - We will holding off diuresis given hypotension and clinical dehydration per Dr Elmarie Shiley recommendation. - We may consider it with improvement of his blood pressure and volume status. - We will continue [place]. - Beta-blocker therapy and ACE inhibitor therapy her currently  contraindicated.  Uncontrolled type 2 diabetes mellitus with hyperglycemia, with long-term current use of insulin ([place]) - The patient will be placed on supplement coverage with resistant NovoLog. - We will holding off IV insulin at this time his significant hypokalemia. - She was given bilateral liter of IV fluids so far. - We will continue basal coverage and antidiabetic therapy including Glucotrol XL and Farxiga.  Dyslipidemia - We will continue high-dose statin therapy with therapy and check fasting lipids in AM.  Chronic hypotension - He may be having mild acute on chronic hypotension from volume depletion. - This could also be contributing to mild AKI superimposed on stage IIIa chronic kidney disease. - He was given 1 L of IV fluids and will continue him on p.o. midodrine  Wound care consult to be obtained for his leg wounds.  DVT prophylaxis: IV heparin. Advanced Care Planning:  Code Status: DNR/DNI.  This was discussed with him. Family Communication:  The plan of care was discussed in details with the patient (and family). I answered all questions. The patient agreed to proceed with the above mentioned plan. Further management will depend upon hospital course. Disposition Plan: Back to previous home environment Consults called: Cardiology All the records are reviewed and case discussed with ED provider.  Status is: Inpatient    At the time of the admission, it appears that the appropriate admission status for this patient is inpatient.  This is judged to be reasonable and necessary in order to provide the required intensity of service to ensure the patient's safety given the presenting symptoms, physical exam findings and initial radiographic and laboratory data in the context of comorbid conditions.  The patient requires inpatient status due to high intensity of service, high risk of further deterioration and high frequency of surveillance required.  I certify that at the time  of admission, it is my clinical judgment that the patient will require inpatient hospital care extending more than 2 midnights.  Dispo: The patient is from: Home              Anticipated d/c is to: Home              Patient currently is not medically stable to d/c.              Difficult to place patient: No  Christel Mormon M.D on 07/31/2022 at 10:48 PM  [facility]   From 7 PM-7 AM, contact night-coverage [email or web address]  CC: Primary care physician; Kirk Ruths, MD

## 2022-07-31 NOTE — Progress Notes (Signed)
ANTICOAGULATION CONSULT NOTE - Initial Consult  Pharmacy Consult for Heparin Indication: chest pain/ACS  Allergies  Allergen Reactions   Empagliflozin Other (See Comments)   Prednisone Other (See Comments) and Swelling    Fluid build up    Shrimp Extract Allergy Skin Test Anaphylaxis   Atorvastatin Other (See Comments)    myalgia Other reaction(s): Unknown myalgia   Codeine     Other reaction(s): Unknown Makes him feel crazy   Dust Mite Extract    Shrimp [Shellfish Allergy]     Patient Measurements: Height: 5' 7.84" (172.3 cm) Weight: 82.9 kg (182 lb 12.2 oz) IBW/kg (Calculated) : 68.02 Heparin Dosing Weight: 82.9  Vital Signs: Temp: 98.1 F (36.7 C) (10/02 1534) Temp Source: Oral (10/02 1534) BP: 93/69 (10/02 2030) Pulse Rate: 86 (10/02 2030)  Labs: Recent Labs    07/31/22 1600 07/31/22 1800 07/31/22 2055  HGB 13.1  --  19.0*  HCT 38.8*  --  56.0*  PLT 215  --   --   CREATININE 1.42*  --   --   TROPONINIHS 1,351* 1,243*  --     Estimated Creatinine Clearance: 42.7 mL/min (A) (by C-G formula based on SCr of 1.42 mg/dL (H)).   Medical History: Past Medical History:  Diagnosis Date   Arthritis    CHF (congestive heart failure) ([place])    Chronic kidney disease    Coronary artery disease    Diabetes mellitus without complication (HCC)    HOH (hard of hearing)    Lower extremity edema     Medications:  (Not in a hospital admission)  Scheduled:   aspirin  324 mg Oral NOW   Or   aspirin  300 mg Rectal NOW   [START ON 08/01/2022] aspirin EC  81 mg Oral Daily   clopidogrel  75 mg Oral Daily   dapagliflozin propanediol  10 mg Oral Daily   glipiZIDE  10 mg Oral Daily   midodrine  10 mg Oral TID WC   potassium chloride  40 mEq Oral BID   rosuvastatin  20 mg Oral QHS   Infusions:   potassium chloride      Assessment: 81yo M with PMH of CAD, CHF, HTN, HLD and T2DM presenting to the ER on 10/2 with complaints of generalized weakness and SOB. On  presentation he did not report chest pain but had an elevated troponin of 1351. Heparin is being initiated for possible ACS in a high risk patient.   Patient is not on anticoagulants prior to admission, H&H are stable.   Goal of Therapy:  Heparin level 0.3-0.7 units/ml Monitor platelets by anticoagulation protocol: Yes   Plan:  Give 4000 units bolus x 1 Start heparin infusion at 1000 units/hr Check anti-Xa level in 8 hours and daily while on heparin Continue to monitor H&H and platelets  Titus Dubin, PharmD PGY1 Pharmacy Resident 07/31/2022 10:17 PM

## 2022-07-31 NOTE — Assessment & Plan Note (Addendum)
Acute on chronic hypotension.   Hypovolemic shock.  Possible over diuresis at home.   Random cortisol 22,4  No clinical signs of infection.  Systolic blood pressure 94 to 105 mmHg.   Patient had albumin yesterday.    Plan to resume furosemide today 40 mg.  Continue with midodrine to 15 mg tid.  Patient with very poor prognosis.

## 2022-07-31 NOTE — ED Provider Triage Note (Signed)
Emergency Medicine Provider Triage Evaluation Note  Aaron Sosa , a 81 y.o. male  was evaluated in triage.  Pt complains of generalized weakness, shortness of breath.  Patient was accompanied by daughter who is primary historian.  Stated patient has been chronically short of breath for the past several months.  The note acute change in the past few days with minimal physical activity.  She also notices generalized weakness stating the patient has been having to have more assistance with daily activities and has been more sedentary than usual.  She also notes cough that is been chronic in nature and unchanged in the acute setting.  Denies fever, chills, night sweats, chest pain, abdominal pain, nausea, vomiting.  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  Review of Systems  Positive: See abov Negative:   Physical Exam  BP (!) 79/46 (BP Location: Right Arm)   Pulse 83   Temp 98.1 F (36.7 C) (Oral)   Resp 18   SpO2 98%  Gen:   Awake, no distress   Resp:  Normal effort  MSK:   Moves extremities without difficulty  Other:    Medical Decision Making  Medically screening exam initiated at 3:53 PM.  Appropriate orders placed.  Aaron Sosa was informed that the remainder of the evaluation will be completed by another provider, this initial triage assessment does not replace that evaluation, and the importance of remaining in the ED until their evaluation is complete.     Wilnette Kales, [place] 07/31/22 1554

## 2022-07-31 NOTE — Consult Note (Signed)
Cardiology Consultation   Patient ID: Aaron Sosa MRN: [id number]; DOB: 05-Apr-1941  Admit date: 07/31/2022 Date of Consult: 07/31/2022  PCP:  Aaron Ruths, MD   [place] Providers Cardiologist:  [name] cardiology    Patient Profile:   Aaron Sosa is a 81 y.o. 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  History of Present Illness:   Aaron Sosa with above PMH presented to ER today with c/o of generalized weakness and SOB.   He follows [facility] cardiology at baseline, last seen on 05/09/22 during inpatient consult. Reportedly he has HFrEF with LVEF 25-30% on 11/06/21 Echo, grade II DD, mod LAE and RAE, mod reduced RV, mild to mod MR, aortic sclerosis. He was hospitalized 04/2022 for acute CHF, required lasix gtt for diuresis. He has severe three-vessel CAD by cardiac catheterization 03/15/2020, not amenable to percutaneous or surgical revascularization. He is on DAPT chronically. He also suffer chronic hypotension requires midodrine support, GDMT has been limited.  He has issue with medication non-compliance, was not taking metolazone and torsemide intermittently due to frequent urination, suppose to take torsemide 100mg  daily at home. He had refused medical care and renal function was poor on 05/09/22 and therefore was made DNR and discharged home with PO meds that he was on, inlcuding ASA 81mg , Plavix 75mg , Farxiga 5mg , midodrine 5mg , crestor 20mg , and torsemide 50mg  daily.  He has BLE swelling.  His daughter restarted his diuretics several weeks ago including torsemide 50 mg a day, Zaroxolyn every other day, spironolactone daily    Patient returned to ER today c/o generalized weakness for the past 2  days. He has been taking all of his medications. He is making urine. He had lost 20 pounds over the past 3 weeks. He feels dizzy and this is abnormal for him.    Admission diagnostic showed hyponatremia 120, hypokalemia 2.5, hypochloremia 67, bicarb 35, glucose 423, BUN 85, Cr 1.42, anion gap 18, and GFR 50. BNP 3368. Hs trop 1351. CBC diff with left shift. CXR with mild central vascular congestion without overt edema.  Clinically he appears to be volume depleted.  His blood pressure supports that diagnosis.   He denies any chest pain.  He is chronically short of breath.  He has occasional nausea and vomiting as well as constipation. Denies any syncope or presyncope.  He was supposed to take metformin but he stopped it because of side effects.   Past Medical History:  Diagnosis Date   Arthritis    CHF (congestive heart failure) (HCC)    Chronic kidney disease    Coronary artery disease    Diabetes mellitus without complication (HCC)    HOH (hard of hearing)    Lower extremity edema     Past Surgical History:  Procedure Laterality Date   CATARACT EXTRACTION Left    CATARACT EXTRACTION W/PHACO Right 05/06/2015   Procedure: CATARACT EXTRACTION PHACO AND INTRAOCULAR LENS PLACEMENT ([place]);  Surgeon: Lyla Glassing, MD;  Location: [facility] ORS;  Service: Ophthalmology;  Laterality: Right;  [place] 1:14.7         AP   11.2          CDE   8.43  cassette lot [id number]   RETINAL DETACHMENT SURGERY     RIGHT/LEFT HEART CATH AND CORONARY ANGIOGRAPHY N/A 03/17/2020   Procedure: RIGHT/LEFT HEART CATH AND CORONARY ANGIOGRAPHY;  Surgeon: Corey Skains, MD;  Location: [facility];  Service: Cardiovascular;  Laterality: N/A;   WRIST FRACTURE SURGERY  1958     Home Medications:  Prior to Admission medications   Medication Sig Start Date End Date Taking? Authorizing Provider  aspirin EC 81 MG EC tablet Take 1 tablet (81 mg total) by mouth daily. 03/19/20   Ezekiel Slocumb, DO  calcium  carbonate (TUMS - DOSED IN MG ELEMENTAL CALCIUM) 500 MG chewable tablet Chew 1 tablet (200 mg of elemental calcium total) by mouth 3 (three) times daily as needed for indigestion or heartburn. 03/18/20   Ezekiel Slocumb, DO  clopidogrel (PLAVIX) 75 MG tablet Take 75 mg by mouth daily. 07/08/21   [provider]  dapagliflozin propanediol (FARXIGA) 5 MG TABS tablet Take 10 mg by mouth daily.    [provider]  glipiZIDE (GLUCOTROL XL) 10 MG 24 hr tablet Take 10 mg by mouth daily. 10/15/21   [provider]  midodrine (PROAMATINE) 5 MG tablet Take 1 tablet (5 mg total) by mouth 3 (three) times daily with meals. Patient not taking: Reported on 07/14/2022 05/09/22   Emeterio Reeve, DO  nitroGLYCERIN (NITROSTAT) 0.4 MG SL tablet Place 0.4 mg under the tongue every 5 (five) minutes as needed for chest pain. Patient not taking: Reported on 07/14/2022    [provider]  [facility] ULTRA test strip Use to check blood sugar up to 2 x daily 06/29/20   Olin Hauser, DO  rosuvastatin (CRESTOR) 20 MG tablet Take 20 mg by mouth at bedtime. 10/27/21   [provider]  senna-docusate (SENOKOT-S) 8.6-50 MG tablet Take 2 tablets by mouth 2 (two) times daily as needed for constipation. 03/27/20   [provider]  torsemide (DEMADEX) 10 MG tablet Take 5 tablets (50 mg total) by mouth daily. 05/10/22   Emeterio Reeve, DO    Inpatient Medications: Scheduled Meds:  midodrine  5 mg Oral Once   Continuous Infusions:  PRN Meds:   Allergies:    Allergies  Allergen Reactions   Empagliflozin Other (See Comments)   Prednisone Other (See Comments) and Swelling    Fluid build up    Shrimp Extract Allergy Skin Test Anaphylaxis   Atorvastatin Other (See Comments)    myalgia Other reaction(s): Unknown myalgia   Codeine     Other reaction(s): Unknown Makes him feel crazy   Dust Mite Extract    Shrimp [Shellfish Allergy]     Social History:    Social History   Socioeconomic History   Marital status: Widowed    Spouse name: Not on file   Number of children: Not on file   Years of education: High School   Highest education level: High school graduate  Occupational History   Not on file  Tobacco Use   Smoking status: Former    Packs/day: 2.00    Years: 5.00    Total pack years: 10.00    Types: Cigarettes   Smokeless tobacco: Former  Scientific laboratory technician Use: Never used  Substance and Sexual Activity   Alcohol use: Yes    Alcohol/week: 1.0 standard drink of alcohol    Types: 1 Cans of beer per week   Drug use: Never   Sexual activity: Not on file  Other Topics  Concern   Not on file  Social History Narrative   Not on file   Social Determinants of Health   Financial Resource Strain: Not on file  Food Insecurity: Not on file  Transportation Needs: Not on file  Physical Activity: Inactive (07/16/2019)   Exercise Vital Sign    Days of Exercise per Week: 0 days    Minutes of Exercise per Session: 0 min  Stress: Not on file  Social Connections: Not on file  Intimate Partner Violence: Not on file    Family History:    Family History  Problem Relation Age of Onset   Heart attack Mother    Diabetes Mellitus II Mother    Heart attack Father      ROS:  Please see the history of present illness.   All other ROS reviewed and negative.     Physical Exam/Data:   Vitals:   07/31/22 1610 07/31/22 1645 07/31/22 1700 07/31/22 1715  BP: (!) 84/63 91/65 (!) 87/59 (!) 82/56  Pulse: 79 83 80 81  Resp:  14 16 15   Temp:      TempSrc:      SpO2: 96% 99% 94% 90%   No intake or output data in the 24 hours ending 07/31/22 1731    07/14/2022    1:28 PM 05/09/2022    3:42 AM 05/06/2022   10:31 AM  Last 3 Weights  Weight (lbs) 180 lb 8 oz 210 lb 8 oz 218 lb 9.6 oz  Weight (kg) 81.874 kg 95.482 kg 99.156 kg     There is no height or weight on file to calculate BMI.   General: Elderly gentleman, no acute distress.  He  appears very weak.  He is extremely hard of hearing. HEENT: normal Neck: no JVD Vascular: No carotid bruits; Distal pulses 2+ bilaterally Cardiac:  normal S1, S2; RRR; no murmur  Lungs:  clear to auscultation bilaterally, no wheezing, rhonchi or rales  Abd: soft, nontender, no hepatomegaly  Ext: no edema Musculoskeletal: He has chronic stasis changes in his legs.  He has several ulcerated areas that are in the process of healing. There is minimal focal areas of edema.  Skin: He has decreased skin turgor ( tenting ) in his hands consistent with volume depletion. Neuro: He is extremely hard of hearing. Psych: Flat affect.  He has difficulty keeping up with conversation because of his difficulty hearing.  EKG:  The EKG was personally reviewed and demonstrates: Normal sinus rhythm.  First-degree AV block.  Frequent premature ventricular contractions  Telemetry:  Telemetry was personally reviewed and demonstrates: Sinus rhythm, frequent PVCs  Relevant CV Studies:  Echo from 11/06/21:    1. Left ventricular ejection fraction, by estimation, is 25 to 30%. The  left ventricle has severely decreased function. The left ventricle  demonstrates global hypokinesis with severe hypokinesis of the  anerior/anteroseptal and apical region. Left  ventricular diastolic parameters are consistent with Grade II diastolic  dysfunction (pseudonormalization).   2. Right ventricular systolic function is moderately reduced. The right  ventricular size is mildly enlarged. Tricuspid regurgitation signal is  inadequate for assessing PA pressure.   3. Left atrial size was moderately dilated.   4. Right atrial size was moderately dilated.   5. The mitral valve is normal in structure. Mild to moderate mitral valve  regurgitation. No evidence of mitral stenosis.   6. The aortic valve is normal in structure. Aortic valve regurgitation is  not visualized. Aortic valve sclerosis/calcification is present, without  any  evidence of aortic stenosis.  Laboratory Data:  High Sensitivity Troponin:   Recent Labs  Lab 07/31/22 1600  TROPONINIHS 1,351*     Chemistry Recent Labs  Lab 07/31/22 1600  NA 120*  K 2.5*  CL 67*  CO2 35*  GLUCOSE 423*  BUN 85*  CREATININE 1.42*  CALCIUM 9.3  GFRNONAA 50*  ANIONGAP 18*    Recent Labs  Lab 07/31/22 1600  PROT 7.3  ALBUMIN 3.5  AST 36  ALT 19  ALKPHOS 75  BILITOT 1.2   Lipids No results for input(s): "CHOL", "TRIG", "HDL", "LABVLDL", "LDLCALC", "CHOLHDL" in the last 168 hours.  Hematology Recent Labs  Lab 07/31/22 1600  WBC 10.5  RBC 4.72  HGB 13.1  HCT 38.8*  MCV 82.2  MCH 27.8  MCHC 33.8  RDW 16.1*  PLT 215   Thyroid No results for input(s): "TSH", "FREET4" in the last 168 hours.  BNP Recent Labs  Lab 07/31/22 1600  BNP 3,368.9*    DDimer No results for input(s): "DDIMER" in the last 168 hours.   Radiology/Studies:  No results found.   Assessment and Plan:    Ischemic Cardiomyopathy  Chronic systolic and diastolic heart failure  - presented with SOB, weakness, leg edema; reports compliant with meds  - BNP 3368 - CXR with mild central vascular congestion without overt edema. - clinically dehydrated, recommend HOLD diuretic  - will check lactic acid  - GDMT: historically difficult due to chronic hypotension, continue farxiga  - may consult advanced heart failure team tomorrow if indicated   CAD with 3 vessel disease not amendable to PCI or surgery  - Hs trop 1351 - [facility] on 03/17/20: Severe three-vessel coronary artery disease with ostial LAD, circumflex and right coronary artery, not amendable for PCI and CABG - medical therapy: ASA 81mg , Plavix 75mg , crestor 20mg , unable use BB and ACEI due to chronic hypotension  - would consider 48 hours heparin gtt if chest pain occurs   Chronic hypotension - on midodrine 5mg  TID at home, BP 79/46 here, improved with midodrine to 90/63 - will check lactic acid   CKD II-III -  renal index actually improving comparing to recent discharge on 05/09/22  , baseline likely around 1.2 - will need monitor UOP and renal index closely   Severe hypokalemia  - check Mag  - need replacement to keep K >4 and Mag >2   Type 2 DM with hyperglycemia  - check Beta-hydroxybutyric acid and VBG  - defer management to internal medicine   Acute Hyponatremia - likely due to hyperglycemia     Risk Assessment/Risk Scores:  { New York Heart Association (NYHA) Functional Class NYHA Class III   For questions or updates, please contact [place] Please consult [email or web address] for contact info under    Signed, Margie Billet, NP  07/31/2022 5:31 PM  Attending Note:   The patient was seen and examined.  Agree with assessment and plan as noted above.  Changes made to the above note as needed.  Patient seen and independently examined with Margie Billet, NP   We discussed all aspects of the encounter. I agree with the assessment and plan as stated above.   Hypotension: Clinically the patient is very volume depleted.  His daughter has been giving him torsemide 50 mg a day as well as metolazone every other day.  The dose of metolazone is not known. We will start with some normal saline as well as IV potassium.  At  this point I do not think that he needs pressors because he clinically is volume depleted.  We will plan on rehydrating him gently as to hopefully avoid volume overload.  He does not need midodrine at this point but he may need it when he is more stable and up ambulating.  2.  Ischemic cardiomyopathy: Patient has severe three-vessel coronary artery disease according to previous records and according to his daughter.  He is not a candidate for PCI or surgery. His ejection fraction is 25%.  3.  Diabetes mellitus: His daughter stopped the Metformin due to side effects.  His glucose is over 400. Further management per the [facility] internal medicine team.  4.  Hyponatremia:  Likely caused by his metolazone therapy.  He is volume contracted.  He needs normal saline.  5.  Hypokalemia: Also likely precipitated by metolazone and torsemide therapy. He will need IV and oral replacement.    I have spent a total of 40 minutes with patient reviewing hospital  notes , telemetry, EKGs, labs and examining patient as well as establishing an assessment and plan that was discussed with the patient.  > 50% of time was spent in direct patient care.    Thayer Headings, [name] Bonito., MD, [facility] 07/31/2022, 6:20 PM [address],  [place] Pager [phone]

## 2022-08-01 ENCOUNTER — Other Ambulatory Visit: Payer: Self-pay

## 2022-08-01 ENCOUNTER — Encounter (HOSPITAL_COMMUNITY): Payer: Self-pay | Admitting: Family Medicine

## 2022-08-01 ENCOUNTER — Inpatient Hospital Stay (HOSPITAL_COMMUNITY): Payer: Medicare HMO

## 2022-08-01 DIAGNOSIS — E876 Hypokalemia: Secondary | ICD-10-CM | POA: Diagnosis not present

## 2022-08-01 DIAGNOSIS — R531 Weakness: Secondary | ICD-10-CM | POA: Diagnosis not present

## 2022-08-01 DIAGNOSIS — E785 Hyperlipidemia, unspecified: Secondary | ICD-10-CM | POA: Diagnosis not present

## 2022-08-01 DIAGNOSIS — I5043 Acute on chronic combined systolic (congestive) and diastolic (congestive) heart failure: Secondary | ICD-10-CM | POA: Diagnosis not present

## 2022-08-01 DIAGNOSIS — J9611 Chronic respiratory failure with hypoxia: Secondary | ICD-10-CM | POA: Diagnosis not present

## 2022-08-01 LAB — LACTIC ACID, PLASMA
Lactic Acid, Venous: 2.1 mmol/L (ref 0.5–1.9)
Lactic Acid, Venous: 2.7 mmol/L (ref 0.5–1.9)
Lactic Acid, Venous: 2.5 mmol/L (ref 0.5–1.9)

## 2022-08-01 LAB — PROTIME-INR
INR: 1.2 (ref 0.8–1.2)
Prothrombin Time: 14.9 seconds (ref 11.4–15.2)

## 2022-08-01 LAB — CBC
HCT: 35.4 % — ABNORMAL LOW (ref 39.0–52.0)
Hemoglobin: 12.3 g/dL — ABNORMAL LOW (ref 13.0–17.0)
MCH: 28.1 pg (ref 26.0–34.0)
MCHC: 34.7 g/dL (ref 30.0–36.0)
MCV: 80.8 fL (ref 80.0–100.0)
Platelets: 203 10*3/uL (ref 150–400)
RBC: 4.38 MIL/uL (ref 4.22–5.81)
RDW: 16.1 % — ABNORMAL HIGH (ref 11.5–15.5)
WBC: 8.5 10*3/uL (ref 4.0–10.5)
nRBC: 0 % (ref 0.0–0.2)

## 2022-08-01 LAB — BASIC METABOLIC PANEL
Anion gap: 17 — ABNORMAL HIGH (ref 5–15)
BUN: 78 mg/dL — ABNORMAL HIGH (ref 8–23)
CO2: 30 mmol/L (ref 22–32)
Calcium: 8.6 mg/dL — ABNORMAL LOW (ref 8.9–10.3)
Chloride: 76 mmol/L — ABNORMAL LOW (ref 98–111)
Creatinine, Ser: 1.21 mg/dL (ref 0.61–1.24)
GFR, Estimated: >60 mL/min (ref >60)
Glucose, Bld: 275 mg/dL — ABNORMAL HIGH (ref 70–99)
Potassium: 2.8 mmol/L — ABNORMAL LOW (ref 3.5–5.1)
Sodium: 123 mmol/L — ABNORMAL LOW (ref 135–145)

## 2022-08-01 LAB — GLUCOSE, CAPILLARY
Glucose-Capillary: 102 mg/dL — ABNORMAL HIGH (ref 70–99)
Glucose-Capillary: 111 mg/dL — ABNORMAL HIGH (ref 70–99)

## 2022-08-01 LAB — LIPID PANEL
Cholesterol: 101 mg/dL (ref 0–200)
HDL: 49 mg/dL (ref >40)
LDL Cholesterol: 42 mg/dL (ref 0–99)
Total CHOL/HDL Ratio: 2.1 RATIO
Triglycerides: 51 mg/dL (ref <150)
VLDL: 10 mg/dL (ref 0–40)

## 2022-08-01 LAB — HEPARIN LEVEL (UNFRACTIONATED)
Heparin Unfractionated: 0.24 IU/mL — ABNORMAL LOW (ref 0.30–0.70)
Heparin Unfractionated: <0.1 IU/mL — ABNORMAL LOW (ref 0.30–0.70)

## 2022-08-01 LAB — CBG MONITORING, ED
Glucose-Capillary: 298 mg/dL — ABNORMAL HIGH (ref 70–99)
Glucose-Capillary: 169 mg/dL — ABNORMAL HIGH (ref 70–99)

## 2022-08-01 LAB — CORTISOL: Cortisol, Plasma: 22.4 ug/dL

## 2022-08-01 MED ORDER — SODIUM CHLORIDE 0.9 % IV BOLUS
500.0000 mL | Freq: Once | INTRAVENOUS | Status: AC
  Administered 2022-08-01: 500 mL via INTRAVENOUS

## 2022-08-01 MED ORDER — LACTATED RINGERS IV BOLUS
500.0000 mL | Freq: Once | INTRAVENOUS | Status: AC
  Administered 2022-08-01: 500 mL via INTRAVENOUS

## 2022-08-01 MED ORDER — SODIUM CHLORIDE 0.9 % IV SOLN: INTRAVENOUS | Status: DC

## 2022-08-01 MED ORDER — SODIUM CHLORIDE 0.9 % IV SOLN: INTRAVENOUS | Status: AC

## 2022-08-01 MED ORDER — INSULIN ASPART 100 UNIT/ML IJ SOLN
0.0000 [IU] | Freq: Three times a day (TID) | INTRAMUSCULAR | Status: DC
  Administered 2022-08-01: 11 [IU] via SUBCUTANEOUS
  Administered 2022-08-01: 4 [IU] via SUBCUTANEOUS
  Administered 2022-08-02: 11 [IU] via SUBCUTANEOUS
  Administered 2022-08-02: 7 [IU] via SUBCUTANEOUS
  Administered 2022-08-02: 3 [IU] via SUBCUTANEOUS
  Administered 2022-08-02 – 2022-08-03 (×2): 4 [IU] via SUBCUTANEOUS
  Administered 2022-08-03: 7 [IU] via SUBCUTANEOUS
  Administered 2022-08-03: 4 [IU] via SUBCUTANEOUS
  Administered 2022-08-03: 3 [IU] via SUBCUTANEOUS
  Administered 2022-08-04: 4 [IU] via SUBCUTANEOUS
  Administered 2022-08-04: 3 [IU] via SUBCUTANEOUS
  Administered 2022-08-04 – 2022-08-05 (×3): 4 [IU] via SUBCUTANEOUS
  Administered 2022-08-05 – 2022-08-06 (×3): 3 [IU] via SUBCUTANEOUS
  Administered 2022-08-06: 4 [IU] via SUBCUTANEOUS
  Administered 2022-08-07: 20 [IU] via SUBCUTANEOUS
  Administered 2022-08-07: 15 [IU] via SUBCUTANEOUS
  Administered 2022-08-07: 7 [IU] via SUBCUTANEOUS

## 2022-08-01 MED ORDER — POTASSIUM CHLORIDE 10 MEQ/100ML IV SOLN
10.0000 meq | INTRAVENOUS | Status: AC
  Administered 2022-08-01 (×4): 10 meq via INTRAVENOUS
  Filled 2022-08-01 (×4): qty 100

## 2022-08-01 NOTE — Hospital Course (Addendum)
This is a 81 year old male with a past medical history of CAD, HFrEF with echo 20-30% EF, HTN, HLD who presented to the emergency room with concerns of shortness of breath and weakness. Patient was initially admitted for concerns of

## 2022-08-01 NOTE — ED Notes (Signed)
Pt had episode of bowel incontinence, bed linen changed, gown changed, and blankets provided.

## 2022-08-01 NOTE — Progress Notes (Signed)
Rounding Note    Patient Name: Aaron Sosa Date of Encounter: 08/01/2022  [facility] Cardiologist: [facility] [place]   Subjective   81 yo with severe combined systolic and diastolic congestive heart failure due to an ischemic cardiomyopathy.  He has chronic hypotension.  He was admitted with hypotension, generalized weakness, and likely overdiuresis.  He has remained hypotensive overnight despite getting IV normal saline.    His skin is quite tanned.  I am concerned about Addison's disease as possibly contributing to his hypotension.  His sodium and potassium are both low which could all be due to aggressive diuresis with metolazone and torsemide.  Inpatient Medications    Scheduled Meds:  aspirin EC  81 mg Oral Daily   clopidogrel  75 mg Oral Daily   dapagliflozin propanediol  10 mg Oral Daily   insulin aspart  0-20 Units Subcutaneous TID AC & HS   midodrine  10 mg Oral TID WC   potassium chloride  40 mEq Oral BID   rosuvastatin  20 mg Oral QHS   Continuous Infusions:  sodium chloride 100 mL/hr at 08/01/22 0749   heparin 1,150 Units/hr (08/01/22 0959)   potassium chloride     PRN Meds: acetaminophen, ALPRAZolam, calcium carbonate, nitroGLYCERIN, ondansetron (ZOFRAN) IV, senna-docusate, traZODone   Vital Signs    Vitals:   08/01/22 0800 08/01/22 0830 08/01/22 1030 08/01/22 1045  BP: (!) 84/71 (!) 89/75 (!) 83/60 98/66  Pulse: 71 73 (!) 43 76  Resp: 20 14 18 20   Temp:      TempSrc:      SpO2: 93% 100% 98% 100%  Weight:      Height:        Intake/Output Summary (Last 24 hours) at 08/01/2022 1051 Last data filed at 08/01/2022 0733 Gross per 24 hour  Intake 120.01 ml  Output --  Net 120.01 ml      07/31/2022   10:00 PM 07/14/2022    1:28 PM 05/09/2022    3:42 AM  Last 3 Weights  Weight (lbs) 182 lb 12.2 oz 180 lb 8 oz 210 lb 8 oz  Weight (kg) 82.9 kg 81.874 kg 95.482 kg      Telemetry    - Personally Reviewed  ECG      NSR - Personally Reviewed  Physical Exam   GEN: No acute distress.   Neck: No JVD Cardiac: RRR, no murmurs, rubs, or gallops.  Respiratory: Clear to auscultation bilaterally. GI: Soft, nontender, non-distended  MS: No edema; No deformity. Neuro:  Nonfocal  Psych: Normal affect   Labs    High Sensitivity Troponin:   Recent Labs  Lab 07/31/22 1600 07/31/22 1800  TROPONINIHS 1,351* 1,243*     Chemistry Recent Labs  Lab 07/31/22 1600 07/31/22 1800 07/31/22 2055 08/01/22 0856  NA 120*  --  117* 123*  K 2.5*  --  2.2* 2.8*  CL 67*  --   --  76*  CO2 35*  --   --  30  GLUCOSE 423*  --   --  275*  BUN 85*  --   --  78*  CREATININE 1.42*  --   --  1.21  CALCIUM 9.3  --   --  8.6*  MG  --  2.7*  --   --   PROT 7.3  --   --   --   ALBUMIN 3.5  --   --   --   AST 36  --   --   --  ALT 19  --   --   --   ALKPHOS 75  --   --   --   BILITOT 1.2  --   --   --   GFRNONAA 50*  --   --  >60  ANIONGAP 18*  --   --  17*    Lipids  Recent Labs  Lab 08/01/22 0316  CHOL 101  TRIG 51  HDL 49  LDLCALC 42  CHOLHDL 2.1    Hematology Recent Labs  Lab 07/31/22 1600 07/31/22 2055 08/01/22 0316  WBC 10.5  --  8.5  RBC 4.72  --  4.38  HGB 13.1 19.0* 12.3*  HCT 38.8* 56.0* 35.4*  MCV 82.2  --  80.8  MCH 27.8  --  28.1  MCHC 33.8  --  34.7  RDW 16.1*  --  16.1*  PLT 215  --  203   Thyroid No results for input(s): "TSH", "FREET4" in the last 168 hours.  BNP Recent Labs  Lab 07/31/22 1600  BNP 3,368.9*    DDimer No results for input(s): "DDIMER" in the last 168 hours.   Radiology    DG Chest Port 1 View  Result Date: 07/31/2022 CLINICAL DATA:  Weakness, dizziness, fell yesterday, short of breath EXAM: PORTABLE CHEST 1 VIEW COMPARISON:  05/08/2022 FINDINGS: Single frontal view of the chest demonstrates an enlarged cardiac silhouette. Mild central vascular congestion without overt edema. No airspace disease, effusion, or pneumothorax. No acute bony abnormalities.  IMPRESSION: 1. Stable enlarged cardiac silhouette. 2. Mild central vascular congestion without overt edema. Electronically Signed   By: Randa Ngo M.D.   On: 07/31/2022 17:58    Cardiac Studies     Patient Profile     81 y.o. male   Assessment & Plan    Hypotension: He has remained hypotensive despite getting IV normal saline.  He clearly presented over diuresed with metolazone and torsemide.  I suggested that we need to consider adrenal insufficiency/adrenal failure as a possible contributor.  His skin is quite tanned and his daughter states that he really does not spend much time outside.  I have ordered a random cortisol level.  2.  Chronic combined systolic and diastolic congestive heart failure: He has an ischemic cardiomyopathy.  EF is around 25%.  This is unchanged from the past several years. He had some edema several weeks ago and his daughter has been giving him torsemide and metolazone fairly frequently since that time.  I think this is resulted in volume depletion.  3.  Troponin elevation: His troponin elevation appears elevated but is not trending like an acute coronary syndrome.  I suspect this is due to he is chronic congestive heart failure.  He has known severe coronary artery disease but has been deemed not a candidate for PCI or bypass grafting.  Continue medical therapy.            For questions or updates, please contact [place] Please consult [email or web address] for contact info under        Signed, Mertie Moores, MD  08/01/2022, 10:51 AM

## 2022-08-01 NOTE — Inpatient Diabetes Management (Signed)
Inpatient Diabetes Program Recommendations  AACE/ADA: New Consensus Statement on Inpatient Glycemic Control (2015)  Target Ranges:  Prepandial:   less than 140 mg/dL      Peak postprandial:   less than 180 mg/dL (1-2 hours)      Critically ill patients:  140 - 180 mg/dL   Lab Results  Component Value Date   GLUCAP 169 (H) 08/01/2022   HGBA1C 8.2 (H) 04/29/2022    Review of Glycemic Control  Diabetes history: DM 2 Outpatient Diabetes medications: Farxiga 10 mg Daily, Ozempic 0.25 mg weekly, Glipizide 10 mg Daily Current orders for Inpatient glycemic control:  Farxiga 10 mg Daily Novolog 0-20 units tid + hs  A1c 10.5% on 9/1  Spoke with pt and daughter at bedside regarding A1c level and glucose control at home. Daughter reports pt lives with her. She also states that at their last PCP appointment that the pt was started on Ozempic and that the [place] dose was increased. Daughter reports that she does not notice an improvement on the pts glucose trends unfortunately. I discussed the A1c level in pts chart and discussed potential complications of uncontrolled glucose levels. I introduced the daughter and patient to the potential of needing insulin to get glucose trends down quickly to goal. Daighter states the pt has only taken Ozempic for 3 weeks. Pt does not currently have side effects of Ozempic  Will watch glucose trends while inpatient.  Thanks,  Tama Headings RN, MSN, BC-ADM Inpatient Diabetes Coordinator Team Pager [phone] (8a-5p)

## 2022-08-01 NOTE — Consult Note (Signed)
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  Right LE with distal eschar measuring 4cm x 4cm. Two other small eschars. Left LE with two small dried eschars Wound bed:As noted above Drainage (amount, consistency, odor) None Periwound: Intact, ruddy,  Dressing procedure/placement/frequency: Patient to be turned and repositioned off of sacrum, heels floated. A sacral silicone foam is to be placed. Topical care will be to wash and dry LEs, then paint eschars with betadine swabstick (povidone-iodine).  A pressure redistribution chair pad is provided for when OOB in chair.  [facility] nursing team will not follow, but will remain available to this patient, the nursing and medical teams.  Please re-consult if needed.  Thank you for inviting [place] to participate in this patient's Plan of Care.  Maudie Flakes, MSN, RN, CNS, [place], Serita Grammes, [employer], [employer] phone:  [phone]

## 2022-08-01 NOTE — ED Notes (Signed)
Wound care nurse at bedside, pt ambulated to recliner with nursing tech.

## 2022-08-01 NOTE — Progress Notes (Signed)
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 -Followed by [facility] clinic cardiology, hospitalized in July with CHF requiring Lasix gtt., also has chronic hypotension on midodrine, limited GDMT.  Poorly compliant with torsemide and metolazone, daughter restarted torsemide 50 Mg daily along with Zaroxolyn every other day and Aldactone few weeks ago, return to the ED with generalized weakness, fatigue, loss 20 pounds in 3 weeks, dizziness. -Work-up in the ED noted to be hypotensive, sodium of 120, potassium 2.5, BUN 85, creatinine 1.4, BNP 3368, troponin 1351, chest x-ray with pulmonary vascular congestion -Cards following, started on IV fluids, multiple fluid boluses given yesterday  Subjective: -Reports feeling tired but overall better compared to yesterday  Assessment and Plan:  Shock -Suspected to be hypovolemic shock, also has chronic hypotension from CHF, on midodrine at baseline -Suspect overdiuresis on torsemide and 3-4 times weekly metolazone -Continue IV fluids, clinically do not suspect infectious process -Random cortisol level is normal -Troponin is elevated, cards following, do not suspect ACS, will repeat echo -Continue midodrine, will request PCCM input, may need temporary pressor support  Hypokalemia -From high-dose torsemide and Aldactone, replete  Non-STEMI  CAD -Likely demand ischemia, has known three-vessel CAD, not amenable to PCI or surgery, medical management has been recommended -Cards following, recommended against IV heparin, will discontinue -Continue aspirin, Plavix, Crestor -Hold beta-blocker and ACE  inhibitor  Chronic combined systolic and diastolic CHF -Last echo with EF 25-30% -Holding diuretics, Farxiga, beta-blocker at this time with hypotension  AKI on CKD 3a Hyponatremia -Improving, likely secondary to hypotension, hold ACE inhibitor -Continue IV fluids and midodrine as above, may need pressors -Suspect severe hyponatremia is secondary to recent excessive metolazone and torsemide use  Uncontrolled type 2 diabetes mellitus with hyperglycemia - Uncontrolled hyperglycemia will add Levemir  Dyslipidemia - Continue statin  DVT prophylaxis: Add Lovenox Code Status: DNR Family Communication: Discussed with patient detail, no family at bedside Disposition Plan: To be determined  Consultants: Cards, PCCM   Procedures:   Antimicrobials:    Objective: Vitals:   08/01/22 1204 08/01/22 1205 08/01/22 1215 08/01/22 1230  BP:  (!) 74/60  (!) 89/60  Pulse:  75 79 (!) 38  Resp:  15 16 19   Temp: (!) 97.5 F (36.4 C)     TempSrc: Oral     SpO2:  94% 97% 97%  Weight:      Height:        Intake/Output Summary (Last 24 hours) at 08/01/2022 1259 Last data filed at 08/01/2022 4403 Gross per 24 hour  Intake 120.01 ml  Output --  Net 120.01 ml   Filed Weights   07/31/22 2200  Weight: 82.9 kg    Examination:  General exam: Appears calm and comfortable  Respiratory system: Clear to auscultation Cardiovascular system: S1 & S2 heard, RRR.  Abd: nondistended, soft and nontender.Normal bowel sounds heard. Central nervous system: Alert and oriented. No focal neurological deficits. Extremities: no edema Skin: No rashes Psychiatry:  Mood & affect appropriate.     Data Reviewed:   CBC: Recent Labs  Lab 07/31/22 1600 07/31/22 2055 08/01/22 0316  WBC 10.5  --  8.5  NEUTROABS 9.1*  --   --  HGB 13.1 19.0* 12.3*  HCT 38.8* 56.0* 35.4*  MCV 82.2  --  80.8  PLT 215  --  123456   Basic Metabolic Panel: Recent Labs  Lab 07/31/22 1600 07/31/22 1800 07/31/22 2055  08/01/22 0856  NA 120*  --  117* 123*  K 2.5*  --  2.2* 2.8*  CL 67*  --   --  76*  CO2 35*  --   --  30  GLUCOSE 423*  --   --  275*  BUN 85*  --   --  78*  CREATININE 1.42*  --   --  1.21  CALCIUM 9.3  --   --  8.6*  MG  --  2.7*  --   --    GFR: Estimated Creatinine Clearance: 50.1 mL/min (by C-G formula based on SCr of 1.21 mg/dL). Liver Function Tests: Recent Labs  Lab 07/31/22 1600  AST 36  ALT 19  ALKPHOS 75  BILITOT 1.2  PROT 7.3  ALBUMIN 3.5   No results for input(s): "LIPASE", "AMYLASE" in the last 168 hours. No results for input(s): "AMMONIA" in the last 168 hours. Coagulation Profile: Recent Labs  Lab 08/01/22 0316  INR 1.2   Cardiac Enzymes: No results for input(s): "CKTOTAL", "CKMB", "CKMBINDEX", "TROPONINI" in the last 168 hours. BNP (last 3 results) No results for input(s): "PROBNP" in the last 8760 hours. HbA1C: No results for input(s): "HGBA1C" in the last 72 hours. CBG: Recent Labs  Lab 07/31/22 1923 08/01/22 0723 08/01/22 1145  GLUCAP 434* 298* 169*   Lipid Profile: Recent Labs    08/01/22 0316  CHOL 101  HDL 49  LDLCALC 42  TRIG 51  CHOLHDL 2.1   Thyroid Function Tests: No results for input(s): "TSH", "T4TOTAL", "FREET4", "T3FREE", "THYROIDAB" in the last 72 hours. Anemia Panel: No results for input(s): "VITAMINB12", "FOLATE", "FERRITIN", "TIBC", "IRON", "RETICCTPCT" in the last 72 hours. Urine analysis:    Component Value Date/Time   COLORURINE YELLOW 07/31/2022 [place] 07/31/2022 1508   LABSPEC 1.007 07/31/2022 1508   PHURINE 6.0 07/31/2022 1508   GLUCOSEU >=500 (A) 07/31/2022 1508   HGBUR NEGATIVE 07/31/2022 1508   BILIRUBINUR NEGATIVE 07/31/2022 1508   KETONESUR NEGATIVE 07/31/2022 1508   PROTEINUR NEGATIVE 07/31/2022 1508   NITRITE NEGATIVE 07/31/2022 1508   LEUKOCYTESUR NEGATIVE 07/31/2022 1508   Sepsis Labs: @LABRCNTIP (procalcitonin:4,lacticidven:4)  ) Recent Results (from the past 240  hour(s))  Resp Panel by RT-PCR (Flu A&B, Covid) Anterior Nasal Swab     Status: None   Collection Time: 07/31/22  3:49 PM   Specimen: Anterior Nasal Swab  Result Value Ref Range Status   SARS Coronavirus 2 by RT PCR NEGATIVE NEGATIVE Final    Comment: (NOTE) SARS-CoV-2 target nucleic acids are NOT DETECTED.  The SARS-CoV-2 RNA is generally detectable in upper respiratory specimens during the acute phase of infection. The lowest concentration of SARS-CoV-2 viral copies this assay can detect is 138 copies/mL. A negative result does not preclude SARS-Cov-2 infection and should not be used as the sole basis for treatment or other patient management decisions. A negative result may occur with  improper specimen collection/handling, submission of specimen other than nasopharyngeal swab, presence of viral mutation(s) within the areas targeted by this assay, and inadequate number of viral copies(<138 copies/mL). A negative result must be combined with clinical observations, patient history, and epidemiological information. The expected result is Negative.  Fact Sheet for Patients:  EntrepreneurPulse.com.au  Fact Sheet for Healthcare Providers:  IncredibleEmployment.be  This test is no t yet approved or cleared by the [place] and  has been authorized for detection and/or diagnosis of SARS-CoV-2 by FDA under an Emergency Use Authorization (EUA). This EUA will remain  in effect (meaning this test can be used) for the duration of the COVID-19 declaration under Section 564(b)(1) of the Act, 21 U.S.C.section 360bbb-3(b)(1), unless the authorization is terminated  or revoked sooner.       Influenza A by PCR NEGATIVE NEGATIVE Final   Influenza B by PCR NEGATIVE NEGATIVE Final    Comment: (NOTE) The Xpert Xpress SARS-CoV-2/FLU/RSV plus assay is intended as an aid in the diagnosis of influenza from Nasopharyngeal swab specimens and should not be  used as a sole basis for treatment. Nasal washings and aspirates are unacceptable for Xpert Xpress SARS-CoV-2/FLU/RSV testing.  Fact Sheet for Patients: EntrepreneurPulse.com.au  Fact Sheet for Healthcare Providers: IncredibleEmployment.be  This test is not yet approved or cleared by the [place] FDA and has been authorized for detection and/or diagnosis of SARS-CoV-2 by FDA under an Emergency Use Authorization (EUA). This EUA will remain in effect (meaning this test can be used) for the duration of the COVID-19 declaration under Section 564(b)(1) of the Act, 21 U.S.C. section 360bbb-3(b)(1), unless the authorization is terminated or revoked.  Performed at [facility], [place] [address] [place], [place] [address]      Radiology Studies: DG Chest Port 1 View  Result Date: 07/31/2022 CLINICAL DATA:  Weakness, dizziness, fell yesterday, short of breath EXAM: PORTABLE CHEST 1 VIEW COMPARISON:  05/08/2022 FINDINGS: Single frontal view of the chest demonstrates an enlarged cardiac silhouette. Mild central vascular congestion without overt edema. No airspace disease, effusion, or pneumothorax. No acute bony abnormalities. IMPRESSION: 1. Stable enlarged cardiac silhouette. 2. Mild central vascular congestion without overt edema. Electronically Signed   By: Randa Ngo M.D.   On: 07/31/2022 17:58     Scheduled Meds:  aspirin EC  81 mg Oral Daily   clopidogrel  75 mg Oral Daily   insulin aspart  0-20 Units Subcutaneous TID AC & HS   midodrine  10 mg Oral TID WC   potassium chloride  40 mEq Oral BID   rosuvastatin  20 mg Oral QHS   Continuous Infusions:  sodium chloride 100 mL/hr at 08/01/22 1211   Followed by   sodium chloride     potassium chloride 10 mEq (08/01/22 1151)   sodium chloride       LOS: 1 day    Time spent: 34min    Domenic Polite, MD [facility]   08/01/2022, 12:59 PM

## 2022-08-01 NOTE — ED Notes (Signed)
Provider Dr. Sidney Ace notified of hypotensive vitals validated at 0226 via secure chat

## 2022-08-01 NOTE — Progress Notes (Signed)
ANTICOAGULATION CONSULT NOTE   Pharmacy Consult for Heparin Indication: chest pain/ACS  Allergies  Allergen Reactions   Empagliflozin Other (See Comments)    "Made him hurt all over"   Prednisone Other (See Comments) and Swelling    Fluid build up    Shrimp Extract Allergy Skin Test Anaphylaxis   Atorvastatin Other (See Comments)    myalgia Other reaction(s): Unknown myalgia   Codeine     Other reaction(s): Unknown Makes him feel crazy   Dust Mite Extract     Patient Measurements: Height: 5' 7.84" (172.3 cm) Weight: 82.9 kg (182 lb 12.2 oz) IBW/kg (Calculated) : 68.02 Heparin Dosing Weight: 82.9  Vital Signs: Temp: 98.2 F (36.8 C) (10/03 0734) Temp Source: Oral (10/03 0734) BP: 89/75 (10/03 0830) Pulse Rate: 73 (10/03 0830)  Labs: Recent Labs    07/31/22 1600 07/31/22 1800 07/31/22 2055 07/31/22 2320 08/01/22 0316 08/01/22 0855  HGB 13.1  --  19.0*  --  12.3*  --   HCT 38.8*  --  56.0*  --  35.4*  --   PLT 215  --   --   --  203  --   APTT  --   --   --  28  --   --   LABPROT  --   --   --   --  14.9  --   INR  --   --   --   --  1.2  --   HEPARINUNFRC  --   --   --   --   --  0.24*  CREATININE 1.42*  --   --   --   --   --   TROPONINIHS 1,351* 1,243*  --   --   --   --      Estimated Creatinine Clearance: 42.7 mL/min (A) (by C-G formula based on SCr of 1.42 mg/dL (H)).   Medical History: Past Medical History:  Diagnosis Date   Arthritis    CHF (congestive heart failure) ([place])    Chronic kidney disease    Coronary artery disease    Diabetes mellitus without complication (HCC)    HOH (hard of hearing)    Lower extremity edema     Medications:  (Not in a hospital admission) Scheduled:   aspirin EC  81 mg Oral Daily   clopidogrel  75 mg Oral Daily   dapagliflozin propanediol  10 mg Oral Daily   insulin aspart  0-20 Units Subcutaneous TID AC & HS   midodrine  10 mg Oral TID WC   potassium chloride  40 mEq Oral BID   rosuvastatin  20 mg Oral  QHS   Infusions:   sodium chloride 100 mL/hr at 08/01/22 0749   heparin 1,000 Units/hr (08/01/22 0733)    Assessment: 81yo M with PMH of CAD, CHF, HTN, HLD and T2DM presenting to the ER on 10/2 with complaints of generalized weakness and SOB. On presentation he did not report chest pain but had an elevated troponin of 1351. Heparin is being initiated for possible ACS in a high risk patient.   Initial heparin level slightly subtherapeutic on 1000 units/hr  Goal of Therapy:  Heparin level 0.3-0.7 units/ml Monitor platelets by anticoagulation protocol: Yes   Plan:  Increase heparin gtt to 1150 units/hr F/u 8 hour heparin level to confirm  Bertis Ruddy, PharmD Clinical Pharmacist ED Pharmacist Phone # [phone] 08/01/2022 9:30 AM

## 2022-08-01 NOTE — Progress Notes (Signed)
Pt arrived from ...4.., A/ox .4.Marland Kitchenpt denies any pain, MD aware,CCMD called. CHG bath given,no further needs at this time

## 2022-08-01 NOTE — Consult Note (Addendum)
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, type 2 diabetes mellitus, hearing loss, hypertension, dyslipidemia presented to the ED with generalized weakness  SIGNIFICANT EVENTS  07/31/22: Admitted to [facility]  STUDIES:     Latest Ref Rng & Units 08/01/2022    3:16 AM 07/31/2022    8:55 PM 07/31/2022    4:00 PM  CBC  WBC 4.0 - 10.5 K/uL 8.5   10.5   Hemoglobin 13.0 - 17.0 g/dL 12.3  19.0  13.1   Hematocrit 39.0 - 52.0 % 35.4  56.0  38.8   Platelets 150 - 400 K/uL 203   215        Latest Ref Rng & Units 08/01/2022    8:56 AM 07/31/2022    8:55 PM 07/31/2022    4:00 PM  CMP  Glucose 70 - 99 mg/dL 275   423   BUN 8 - 23 mg/dL 78   85   Creatinine 0.61 - 1.24 mg/dL 1.21   1.42   Sodium 135 - 145 mmol/L 123  117  120   Potassium 3.5 - 5.1 mmol/L 2.8  2.2  2.5   Chloride 98 - 111 mmol/L 76   67   CO2 22 - 32 mmol/L 30   35   Calcium 8.9 - 10.3 mg/dL 8.6   9.3   Total Protein 6.5 - 8.1 g/dL   7.3   Total Bilirubin 0.3 - 1.2 mg/dL   1.2   Alkaline Phos 38 - 126 U/L   75   AST 15 - 41 U/L   36   ALT 0 - 44 U/L   19     BNP 3,368   HISTORY OF PRESENT ILLNESS:  Aaron Sosa is a 81/M with history of combined HF (EF 25 to 30%), CKD 3a, chronic hypoxic respiratory failure on 2 L home O2, 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 Per pt's daughter pt has hx of non-adherence to his medications especially his diuretics but she has been given these to her regularly. He has been feeling weak and fatigue since 07/28/22 that is progressing. Daughter states pt had lower extremity  edema that has resolved now. Pt denying chest pain, shortness of breath, fevers or chills.   PAST MEDICAL HISTORY :   has a past medical history of Arthritis, CHF (congestive heart failure) ([place]), Chronic kidney disease, Coronary artery disease, Diabetes mellitus without complication ([place]), HOH (hard of hearing), and Lower extremity edema.  has a past surgical history that includes Retinal detachment surgery; Cataract extraction (Left); Cataract extraction w/PHACO (Right, 05/06/2015); Wrist fracture surgery (1958); and RIGHT/LEFT HEART CATH AND CORONARY ANGIOGRAPHY (N/A, 03/17/2020). Prior to Admission medications   Medication Sig Start Date End Date Taking? Authorizing Provider  acetaminophen (TYLENOL) 325 MG tablet Take 325 mg by mouth every 6 (six) hours as needed for mild pain or moderate pain.   Yes [provider]  calcium carbonate (TUMS - DOSED IN MG ELEMENTAL CALCIUM) 500 MG chewable tablet Chew 1 tablet (200 mg of elemental calcium total) by mouth 3 (three) times daily  as needed for indigestion or heartburn. 03/18/20  Yes Nicole Kindred A, DO  clopidogrel (PLAVIX) 75 MG tablet Take 75 mg by mouth daily. 07/08/21  Yes [provider]  dapagliflozin propanediol (FARXIGA) 5 MG TABS tablet Take 5 mg by mouth daily.   Yes [provider]  glipiZIDE (GLUCOTROL XL) 10 MG 24 hr tablet Take 10 mg by mouth daily. 10/15/21  Yes [provider]  metolazone (ZAROXOLYN) 5 MG tablet Take 5 mg by mouth every other day. 06/08/22  Yes [provider]  OZEMPIC, 0.25 OR 0.5 MG/DOSE, 2 MG/3ML SOPN Inject 0.25 mg into the skin once a week. Saturday 07/04/22  Yes [provider]  rosuvastatin (CRESTOR) 20 MG tablet Take 20 mg by mouth at bedtime. 10/27/21  Yes [provider]  senna-docusate (SENOKOT-S) 8.6-50 MG tablet Take 2 tablets by mouth 2 (two) times daily as needed for constipation. 03/27/20  Yes [provider]  spironolactone (ALDACTONE) 25  MG tablet Take 25 mg by mouth daily. 07/03/22  Yes [provider]  torsemide (DEMADEX) 10 MG tablet Take 5 tablets (50 mg total) by mouth daily. Patient taking differently: Take 100 mg by mouth daily. 05/10/22  Yes Emeterio Reeve, DO  aspirin EC 81 MG EC tablet Take 1 tablet (81 mg total) by mouth daily. Patient not taking: Reported on 07/31/2022 03/19/20   Nicole Kindred A, DO  midodrine (PROAMATINE) 5 MG tablet Take 1 tablet (5 mg total) by mouth 3 (three) times daily with meals. Patient not taking: Reported on 07/14/2022 05/09/22   Emeterio Reeve, DO  nitroGLYCERIN (NITROSTAT) 0.4 MG SL tablet Place 0.4 mg under the tongue every 5 (five) minutes as needed for chest pain. Patient not taking: Reported on 07/14/2022    [provider]  [name] ULTRA test strip Use to check blood sugar up to 2 x daily 06/29/20   Olin Hauser, DO   Allergies  Allergen Reactions   Empagliflozin Other (See Comments)    "Made him hurt all over"   Prednisone Other (See Comments) and Swelling    Fluid build up    Shrimp Extract Allergy Skin Test Anaphylaxis   Atorvastatin Other (See Comments)    myalgia Other reaction(s): Unknown myalgia   Codeine     Other reaction(s): Unknown Makes him feel crazy   Dust Mite Extract     FAMILY HISTORY:  family history includes Diabetes Mellitus II in his mother; Heart attack in his father and mother. SOCIAL HISTORY:  reports that he has quit smoking. His smoking use included cigarettes. He has a 10.00 pack-year smoking history. He has quit using smokeless tobacco. He reports current alcohol use of about 1.0 standard drink of alcohol per week. He reports that he does not use drugs.  REVIEW OF SYSTEMS:   Negative unless stated in the HPI.   SUBJECTIVE:  No acute concerns. States he is feeling well.   VITAL SIGNS: Temp:  [97.5 F (36.4 C)-98.2 F (36.8 C)] 97.5 F (36.4 C) (10/03 1204) Pulse Rate:  [38-95] 38 (10/03 1230) Resp:   [11-32] 19 (10/03 1230) BP: (68-124)/(36-75) 89/60 (10/03 1230) SpO2:  [87 %-100 %] 97 % (10/03 1230) Weight:  [82.9 kg] 82.9 kg (10/02 2200)  PHYSICAL EXAMINATION: General: NAD, elderly male HENT: Dry MM, hard of earing CV: sinus rhythm Resp: CTAB Abd: No TTP MSK: bilateral leg ulcers  Neuro: alert and oriented x4 Psych: normal mood and normal affect  Recent Labs  Lab 07/31/22 1600 07/31/22 2055  08/01/22 0856  NA 120* 117* 123*  K 2.5* 2.2* 2.8*  CL 67*  --  76*  CO2 35*  --  30  BUN 85*  --  78*  CREATININE 1.42*  --  1.21  GLUCOSE 423*  --  275*   Recent Labs  Lab 07/31/22 1600 07/31/22 2055 08/01/22 0316  HGB 13.1 19.0* 12.3*  HCT 38.8* 56.0* 35.4*  WBC 10.5  --  8.5  PLT 215  --  203   DG Chest Port 1 View  Result Date: 07/31/2022 CLINICAL DATA:  Weakness, dizziness, fell yesterday, short of breath EXAM: PORTABLE CHEST 1 VIEW COMPARISON:  05/08/2022 FINDINGS: Single frontal view of the chest demonstrates an enlarged cardiac silhouette. Mild central vascular congestion without overt edema. No airspace disease, effusion, or pneumothorax. No acute bony abnormalities. IMPRESSION: 1. Stable enlarged cardiac silhouette. 2. Mild central vascular congestion without overt edema. Electronically Signed   By: Randa Ngo M.D.   On: 07/31/2022 17:58    ASSESSMENT / PLAN: Hypotension 2/2 to hypovolemia in setting of diuretic use Pt hypotension appears secondary to his diuretic use. It appears his diuretics were titrated upwards as pt was previously non-adherent to torsemide 40 mg and metolazone but that was increased to torsemide 50 mg at discharge during her last hospitalization as it was inadequate. But it appears pt just needed better adherence which the daughter is ensuring now. Given end stage heart failure, we would recommend IV hydration and would recommend against pressor or inotropic support given over all poor prognosis of his heart disease. Pt is not amenable to PCI  and other advanced therapies. Discussed this with daughter at bedside. -Continue volume repletion with IVF -No need for ICU admission as mentioned above -Re-dose diuretics as indicated before discharge.  Other conditions per primary  Idamae Schuller, MD [facility]. [facility] Internal Medicine Residency, PGY-2  08/01/2022, 1:28 PM

## 2022-08-02 ENCOUNTER — Encounter (HOSPITAL_COMMUNITY): Payer: Self-pay | Admitting: Family Medicine

## 2022-08-02 ENCOUNTER — Inpatient Hospital Stay (HOSPITAL_COMMUNITY): Payer: Medicare HMO

## 2022-08-02 DIAGNOSIS — I255 Ischemic cardiomyopathy: Secondary | ICD-10-CM

## 2022-08-02 DIAGNOSIS — I5042 Chronic combined systolic (congestive) and diastolic (congestive) heart failure: Secondary | ICD-10-CM | POA: Diagnosis not present

## 2022-08-02 DIAGNOSIS — E1169 Type 2 diabetes mellitus with other specified complication: Secondary | ICD-10-CM

## 2022-08-02 DIAGNOSIS — E871 Hypo-osmolality and hyponatremia: Secondary | ICD-10-CM | POA: Diagnosis not present

## 2022-08-02 DIAGNOSIS — N1831 Chronic kidney disease, stage 3a: Secondary | ICD-10-CM

## 2022-08-02 DIAGNOSIS — E876 Hypokalemia: Secondary | ICD-10-CM | POA: Diagnosis not present

## 2022-08-02 DIAGNOSIS — I9589 Other hypotension: Secondary | ICD-10-CM | POA: Diagnosis not present

## 2022-08-02 DIAGNOSIS — I5043 Acute on chronic combined systolic (congestive) and diastolic (congestive) heart failure: Secondary | ICD-10-CM | POA: Diagnosis not present

## 2022-08-02 DIAGNOSIS — E785 Hyperlipidemia, unspecified: Secondary | ICD-10-CM

## 2022-08-02 DIAGNOSIS — R531 Weakness: Secondary | ICD-10-CM | POA: Diagnosis not present

## 2022-08-02 LAB — CBC
HCT: 37.7 % — ABNORMAL LOW (ref 39.0–52.0)
Hemoglobin: 12.6 g/dL — ABNORMAL LOW (ref 13.0–17.0)
MCH: 27.5 pg (ref 26.0–34.0)
MCHC: 33.4 g/dL (ref 30.0–36.0)
MCV: 82.3 fL (ref 80.0–100.0)
Platelets: 230 10*3/uL (ref 150–400)
RBC: 4.58 MIL/uL (ref 4.22–5.81)
RDW: 16.9 % — ABNORMAL HIGH (ref 11.5–15.5)
WBC: 10.3 10*3/uL (ref 4.0–10.5)
nRBC: 0 % (ref 0.0–0.2)

## 2022-08-02 LAB — ECHOCARDIOGRAM COMPLETE
AR max vel: 2.56 cm2
AV Area VTI: 2.35 cm2
AV Area mean vel: 2.07 cm2
AV Mean grad: 2 mmHg
AV Peak grad: 2.8 mmHg
Ao pk vel: 0.84 m/s
Area-P 1/2: 3.16 cm2
Calc EF: 17.4 %
Height: 67.835 in
MV M vel: 3.55 m/s
MV Peak grad: 50.5 mmHg
Radius: 0.3 cm
S' Lateral: 4.9 cm
Single Plane A2C EF: 16.2 %
Single Plane A4C EF: 17.1 %
Weight: 2924.18 oz

## 2022-08-02 LAB — COMPREHENSIVE METABOLIC PANEL
ALT: 29 U/L (ref 0–44)
AST: 51 U/L — ABNORMAL HIGH (ref 15–41)
Albumin: 3.2 g/dL — ABNORMAL LOW (ref 3.5–5.0)
Alkaline Phosphatase: 89 U/L (ref 38–126)
Anion gap: 18 — ABNORMAL HIGH (ref 5–15)
BUN: 80 mg/dL — ABNORMAL HIGH (ref 8–23)
CO2: 27 mmol/L (ref 22–32)
Calcium: 8.6 mg/dL — ABNORMAL LOW (ref 8.9–10.3)
Chloride: 81 mmol/L — ABNORMAL LOW (ref 98–111)
Creatinine, Ser: 1.36 mg/dL — ABNORMAL HIGH (ref 0.61–1.24)
GFR, Estimated: 52 mL/min — ABNORMAL LOW (ref >60)
Glucose, Bld: 130 mg/dL — ABNORMAL HIGH (ref 70–99)
Potassium: 3.9 mmol/L (ref 3.5–5.1)
Sodium: 126 mmol/L — ABNORMAL LOW (ref 135–145)
Total Bilirubin: 1.3 mg/dL — ABNORMAL HIGH (ref 0.3–1.2)
Total Protein: 6.2 g/dL — ABNORMAL LOW (ref 6.5–8.1)

## 2022-08-02 LAB — GLUCOSE, CAPILLARY
Glucose-Capillary: 155 mg/dL — ABNORMAL HIGH (ref 70–99)
Glucose-Capillary: 127 mg/dL — ABNORMAL HIGH (ref 70–99)
Glucose-Capillary: 246 mg/dL — ABNORMAL HIGH (ref 70–99)
Glucose-Capillary: 260 mg/dL — ABNORMAL HIGH (ref 70–99)

## 2022-08-02 LAB — LIPOPROTEIN A (LPA): Lipoprotein (a): 223.8 nmol/L — ABNORMAL HIGH (ref <75.0)

## 2022-08-02 MED ORDER — PERFLUTREN LIPID MICROSPHERE
1.0000 mL | INTRAVENOUS | Status: AC | PRN
  Administered 2022-08-02: 5 mL via INTRAVENOUS

## 2022-08-02 MED ORDER — SODIUM CHLORIDE 0.9 % IV SOLN: INTRAVENOUS | Status: DC

## 2022-08-02 NOTE — Progress Notes (Addendum)
Progress Note   Patient: Aaron Sosa KXF:[id number] DOB: February 09, 1941 DOA: 07/31/2022     2 DOS: the patient was seen and examined on 08/02/2022   Brief hospital course: [name] Sosa was admitted to the hospital with the working diagnosis of acute on chronic hypotension.   81 yo male with the past medical hisotory of CAD, chronic heart failure, hypertension, dyslipidemia and T2DM who presented with weakness. Patient reported 2 days of worsening symptoms, positive weight loss 20 lbs in 3 weeks. Recent hospitalization for heart failure, he was diuresed and placed on midodrine due to hypotension (06/30 to 05/09/22). Apparently patient non compliant with his medical therapy, but his daughter restarted torsemide and metolazone a few weeks prior. On his initial physical examination his blood pressure was 79/46, HR 86, RR 19 and 02 saturation 94%, lungs with decreased breath sounds at bases, heart with S1 and S2 present and rhythmic, abdomen no distended and no lower extremity edema. Bilateral leg ulcerated wounds.   Na 125, K 3,5 Cl 67, bicarbonate 35, glucose 423 bun 85 cr 1,42  BNP 3,368  High sensitive troponin 1,352 1,243  Wbc 10,5 hgb 31,1 plt 215  Sars covid 19 and influenza negative  Urine analysis SG 1,007, 0-5 rbc, 0-5 wbc, negative protein.   Chest radiograph with cardiomegaly and bilateral hilar vascular congestion with no infiltrates.   EKG 83 bpm, right axis deviation, 1st degree AV block sinus rhythm with PVC trigeminy, with no significant ST segment or T wave changes.   Patient did received IV fluids for hypotension.       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  Shock.  Possible over diuresis at home.   Blood pressure more stable with systolic 91 to 696 mmHg. Hold on IV fluids  Continue with midodrine 10 mg tid.  Continue telemetry monitoring, out of bed to chair tid with meals, continue PT and OT.   Acute on chronic combined systolic and  diastolic CHF (congestive heart failure) (HCC) Echocardiogram from 10/2021 patient had a reduced LV systolic function 25 to 78%, with global hypokinesis with severe hypokinesis of the anterior and antero septal and apical region. Moderate reduction in RV systolic function. Mild to moderate MR.  Patient with JVD today Follow up on new echocardiogram Hold on IV fluids Will need diuretic therapy at the time of his discharge Medical therapy with midodrine for blood pressure support, limited pharmacological options due to risk of hypotension.   Troponin elevation due to heart failure decompensation, no signs of acute coronary syndrome, NSTEMI has been ruled out.  Continue close monitoring of his symptoms.   Chronic kidney disease, stage 3a (HCC) Hyponatremia, hypokalemia.   Na is 126, K 3,9, CL 81, bicarbonate 27, glucose 130 BUN 80 and cr 1,36  Plan to continue holding diuretic therapy Discontinue IV fluids and follow up renal function and electrolytes in am.  Liberate diet and consult nutrition for recommendations. Avoid hypotension.     Type 2 diabetes mellitus with hyperlipidemia (HCC) Glucose has been stable, this am fasting 130 mg/dl  Plan to continue glucose cover and monitoring with insulin sliding scale.  Continue with statin therapy.       Subjective: patient with no chest pain or dyspnea.   Physical Exam: Vitals:   08/02/22 0809 08/02/22 0812 08/02/22 0900 08/02/22 1100  BP: (!) 89/72 100/73 94/67 (!) 89/66  Pulse: 83 78 80 77  Resp: 18 20 (!) 21 15  Temp: 98.2 F (36.8 C)  97.8 F (36.6 C)  TempSrc: Oral   Oral  SpO2: 100% 98% 96% 95%  Weight:      Height:       Neurology awake and alert ENT with mild pallor Cardiovascular with S1 and S2 present with no gallops Positive JVD.  Respiratory with no rales or wheezing Abdomen no distended Lower extremity with trace edema Positive wounds bilateral lower extremities.   Data Reviewed:    Family  Communication: no family at the bedside   Disposition: Status is: Inpatient Remains inpatient appropriate because: hypotension   Planned Discharge Destination: Home  Author: Tawni Millers, MD 08/02/2022 11:23 AM  For on call review [email or web address].

## 2022-08-02 NOTE — Progress Notes (Signed)
Mobility Specialist Progress Note:   08/02/22 1139  Mobility  Activity Ambulated with assistance in room;Ambulated with assistance to bathroom  Activity Response Tolerated well  Distance Ambulated (ft) 32 ft  $Mobility charge 1 Mobility  Level of Assistance Contact guard assist, steadying assist  Assistive Device Front wheel walker   Pt received in bed willing to participate in mobility. No complaints of pain. Upon standing pt had some BM in the bed and he asked to go to the bathroom, Pt able to have BM. Left in bed with call bell in reach and all needs met.   [facility] Surveyor, mining Chat only

## 2022-08-02 NOTE — Progress Notes (Signed)
Rounding Note    Patient Name: Aaron Sosa Date of Encounter: 08/02/2022  [place] Cardiologist: [facility] in [place]   Subjective   81 yo with severe combined systolic and diastolic congestive heart failure due to an ischemic cardiomyopathy.  He has chronic hypotension.  He was admitted with hypotension, generalized weakness, and likely overdiuresis.  He has remained hypotensive overnight despite getting IV normal saline.      His sodium and potassium have been  low which could all be due to aggressive diuresis with metolazone and torsemide. His daughter was giving him Torsemide 100 mg a day, Metolazone 5 mg QOD . When he is discharged, he will need to be on a lower dose of diuretics .   BP, sodium levels, potassium levels have all improved with gentle hydration     Inpatient Medications    Scheduled Meds:  aspirin EC  81 mg Oral Daily   clopidogrel  75 mg Oral Daily   insulin aspart  0-20 Units Subcutaneous TID AC & HS   midodrine  10 mg Oral TID WC   potassium chloride  40 mEq Oral BID   rosuvastatin  20 mg Oral QHS   Continuous Infusions:  sodium chloride 75 mL/hr at 08/02/22 0635   PRN Meds: acetaminophen, ALPRAZolam, calcium carbonate, nitroGLYCERIN, ondansetron (ZOFRAN) IV, senna-docusate, traZODone   Vital Signs    Vitals:   08/02/22 0400 08/02/22 0500 08/02/22 0809 08/02/22 0812  BP: (!) 80/65 91/65 (!) 89/72 100/73  Pulse: 77 86 83 78  Resp:   18 20  Temp:   98.2 F (36.8 C)   TempSrc:   Oral   SpO2: 99% (!) 85% 100% 98%  Weight:      Height:       No intake or output data in the 24 hours ending 08/02/22 0842     07/31/2022   10:00 PM 07/14/2022    1:28 PM 05/09/2022    3:42 AM  Last 3 Weights  Weight (lbs) 182 lb 12.2 oz 180 lb 8 oz 210 lb 8 oz  Weight (kg) 82.9 kg 81.874 kg 95.482 kg      Telemetry    - Personally Reviewed  ECG     NSR - Personally Reviewed  Physical Exam   GEN: elderly male    Neck: No JVD Cardiac:  RR  Respiratory: Clear to auscultation bilaterally. GI: Soft, nontender, non-distended  MS: No edema; No deformity. Neuro:  Nonfocal  Psych: Normal affect   Labs    High Sensitivity Troponin:   Recent Labs  Lab 07/31/22 1600 07/31/22 1800  TROPONINIHS 1,351* 1,243*      Chemistry Recent Labs  Lab 07/31/22 1600 07/31/22 1800 07/31/22 2055 08/01/22 0856 08/02/22 0121  NA 120*  --  117* 123* 126*  K 2.5*  --  2.2* 2.8* 3.9  CL 67*  --   --  76* 81*  CO2 35*  --   --  30 27  GLUCOSE 423*  --   --  275* 130*  BUN 85*  --   --  78* 80*  CREATININE 1.42*  --   --  1.21 1.36*  CALCIUM 9.3  --   --  8.6* 8.6*  MG  --  2.7*  --   --   --   PROT 7.3  --   --   --  6.2*  ALBUMIN 3.5  --   --   --  3.2*  AST 36  --   --   --  51*  ALT 19  --   --   --  29  ALKPHOS 75  --   --   --  89  BILITOT 1.2  --   --   --  1.3*  GFRNONAA 50*  --   --  >60 52*  ANIONGAP 18*  --   --  17* 18*     Lipids  Recent Labs  Lab 08/01/22 0316  CHOL 101  TRIG 51  HDL 49  LDLCALC 42  CHOLHDL 2.1     Hematology Recent Labs  Lab 07/31/22 1600 07/31/22 2055 08/01/22 0316 08/02/22 0121  WBC 10.5  --  8.5 10.3  RBC 4.72  --  4.38 4.58  HGB 13.1 19.0* 12.3* 12.6*  HCT 38.8* 56.0* 35.4* 37.7*  MCV 82.2  --  80.8 82.3  MCH 27.8  --  28.1 27.5  MCHC 33.8  --  34.7 33.4  RDW 16.1*  --  16.1* 16.9*  PLT 215  --  203 230    Thyroid No results for input(s): "TSH", "FREET4" in the last 168 hours.  BNP Recent Labs  Lab 07/31/22 1600  BNP 3,368.9*     DDimer No results for input(s): "DDIMER" in the last 168 hours.   Radiology    DG Chest Port 1 View  Result Date: 07/31/2022 CLINICAL DATA:  Weakness, dizziness, fell yesterday, short of breath EXAM: PORTABLE CHEST 1 VIEW COMPARISON:  05/08/2022 FINDINGS: Single frontal view of the chest demonstrates an enlarged cardiac silhouette. Mild central vascular congestion without overt edema. No airspace disease,  effusion, or pneumothorax. No acute bony abnormalities. IMPRESSION: 1. Stable enlarged cardiac silhouette. 2. Mild central vascular congestion without overt edema. Electronically Signed   By: Randa Ngo M.D.   On: 07/31/2022 17:58    Cardiac Studies     Patient Profile     81 y.o. male   Assessment & Plan    Hypotension:  The hypotension has improved with gentle hydration  Cortisol levels were normal - suggests no evidence of adrenal failure  Cont hydration   2.  Chronic combined systolic and diastolic congestive heart failure: He has an ischemic cardiomyopathy.  EF is around 25%.  This is unchanged from the past several years. He was over diuresed when he presented  He will need a lower dose of torsemide and metalazone at the time of DC   3.  Troponin elevation: c/W his severe CAD and CHF.  He has been determined to NOT be a candidate for PCI or CABG.        For questions or updates, please contact [place] Please consult [email or web address] for contact info under        Signed, Mertie Moores, MD  08/02/2022, 8:42 AM

## 2022-08-02 NOTE — Hospital Course (Addendum)
Aaron Sosa was admitted to the hospital with the working diagnosis of acute on chronic hypotension, heart failure with hypoperfusion. Complicated with renal failure. Poor prognosis.   81 yo male with the past medical hisotory of CAD, chronic heart failure, hypertension, dyslipidemia and T2DM who presented with weakness. Patient reported 2 days of worsening symptoms, positive weight loss 20 lbs in 3 weeks. Recent hospitalization for heart failure, he was diuresed and placed on midodrine due to hypotension (06/30 to 05/09/22). Apparently patient non compliant with his medical therapy, but his daughter restarted torsemide and metolazone a few weeks prior. On his initial physical examination his blood pressure was 79/46, HR 86, RR 19 and 02 saturation 94%, lungs with decreased breath sounds at bases, heart with S1 and S2 present and rhythmic, abdomen no distended and no lower extremity edema. Bilateral leg ulcerated wounds.   Na 125, K 3,5 Cl 67, bicarbonate 35, glucose 423 bun 85 cr 1,42  BNP 3,368  High sensitive troponin 1,352 1,243  Wbc 10,5 hgb 31,1 plt 215  Sars covid 19 and influenza negative  Urine analysis SG 1,007, 0-5 rbc, 0-5 wbc, negative protein.   Chest radiograph with cardiomegaly and bilateral hilar vascular congestion with no infiltrates.   EKG 83 bpm, right axis deviation, 1st degree AV block sinus rhythm with PVC trigeminy, with no significant ST segment or T wave changes.   Patient did received IV fluids for hypotension.   10/06 renal function and serum Na have improved, but not back to baseline.  10/07 patient with hypoperfusion, poor prognosis, continue in renal failure. Patient had IV albumin.  10/08 blood pressure more stable, resume furosemide with close monitoring of blood pressure. 10/09 renal function has been worsening, patient has been confused and agitated.

## 2022-08-03 DIAGNOSIS — N189 Chronic kidney disease, unspecified: Secondary | ICD-10-CM

## 2022-08-03 DIAGNOSIS — E1169 Type 2 diabetes mellitus with other specified complication: Secondary | ICD-10-CM | POA: Diagnosis not present

## 2022-08-03 DIAGNOSIS — L89159 Pressure ulcer of sacral region, unspecified stage: Secondary | ICD-10-CM | POA: Diagnosis present

## 2022-08-03 DIAGNOSIS — N1831 Chronic kidney disease, stage 3a: Secondary | ICD-10-CM | POA: Diagnosis not present

## 2022-08-03 DIAGNOSIS — I9589 Other hypotension: Secondary | ICD-10-CM | POA: Diagnosis not present

## 2022-08-03 DIAGNOSIS — I5043 Acute on chronic combined systolic (congestive) and diastolic (congestive) heart failure: Secondary | ICD-10-CM | POA: Diagnosis not present

## 2022-08-03 DIAGNOSIS — N179 Acute kidney failure, unspecified: Secondary | ICD-10-CM | POA: Diagnosis not present

## 2022-08-03 LAB — BASIC METABOLIC PANEL
Anion gap: 14 (ref 5–15)
BUN: 87 mg/dL — ABNORMAL HIGH (ref 8–23)
CO2: 26 mmol/L (ref 22–32)
Calcium: 8.5 mg/dL — ABNORMAL LOW (ref 8.9–10.3)
Chloride: 84 mmol/L — ABNORMAL LOW (ref 98–111)
Creatinine, Ser: 1.64 mg/dL — ABNORMAL HIGH (ref 0.61–1.24)
GFR, Estimated: 42 mL/min — ABNORMAL LOW (ref >60)
Glucose, Bld: 208 mg/dL — ABNORMAL HIGH (ref 70–99)
Potassium: 4.2 mmol/L (ref 3.5–5.1)
Sodium: 124 mmol/L — ABNORMAL LOW (ref 135–145)

## 2022-08-03 LAB — GLUCOSE, CAPILLARY
Glucose-Capillary: 130 mg/dL — ABNORMAL HIGH (ref 70–99)
Glucose-Capillary: 185 mg/dL — ABNORMAL HIGH (ref 70–99)
Glucose-Capillary: 195 mg/dL — ABNORMAL HIGH (ref 70–99)
Glucose-Capillary: 221 mg/dL — ABNORMAL HIGH (ref 70–99)

## 2022-08-03 MED ORDER — SODIUM CHLORIDE 0.9 % IV BOLUS
500.0000 mL | Freq: Once | INTRAVENOUS | Status: AC
  Administered 2022-08-03: 500 mL via INTRAVENOUS

## 2022-08-03 MED ORDER — ENSURE ENLIVE PO LIQD
237.0000 mL | ORAL | Status: DC
  Administered 2022-08-03 – 2022-08-07 (×3): 237 mL via ORAL

## 2022-08-03 MED ORDER — MIDODRINE HCL 5 MG PO TABS
15.0000 mg | ORAL_TABLET | Freq: Three times a day (TID) | ORAL | Status: DC
  Administered 2022-08-03 – 2022-08-08 (×15): 15 mg via ORAL
  Filled 2022-08-03 (×15): qty 3

## 2022-08-03 MED ORDER — ADULT MULTIVITAMIN W/MINERALS CH
1.0000 | ORAL_TABLET | Freq: Every day | ORAL | Status: DC
  Administered 2022-08-03 – 2022-08-08 (×6): 1 via ORAL
  Filled 2022-08-03 (×6): qty 1

## 2022-08-03 NOTE — Progress Notes (Signed)
Initial Nutrition Assessment  DOCUMENTATION CODES:   Not applicable  INTERVENTION:   Ensure Plus High Protein PO once daily, each supplement provides 350 kcal and 20 grams of protein.   Carnation Breakfast Essentials BID between meals, each packet mixed with 8 ounces of 2% milk provides 13 grams of protein and 260 calories.  Magic cup TID with meals, each supplement provides 290 kcal and 9 grams of protein.  MVI with minerals 1 tablet PO once daily.  NUTRITION DIAGNOSIS:   Inadequate oral intake related to decreased appetite as evidenced by per patient/family report.  GOAL:   Patient will meet greater than or equal to 90% of their needs  MONITOR:   PO intake, Supplement acceptance  REASON FOR ASSESSMENT:   Consult Assessment of nutrition requirement/status  ASSESSMENT:   81 yo male admitted with hypotension. PMH includes CAD, CHF, HTN, HLD, DM-2, CKD, HOH.  Spoke with patient's daughter over the phone. She reports that patient has lost a lot of weight recently, partially related to fluids, but also d/t decreased appetite and intake. He has tried Ensure/Boost supplements in the past, but they made him feel bloated, so he stopped drinking them. At home, he was eating 2-3 meals/snacks per day, but a lot less than usual.   Labs reviewed. Na 124 CBG: 246-260-130  Medications reviewed and include Novolog.  Weight history reviewed. Patient with 13% weight loss within the past 3 months, which is severe. Suspect patient has some degree of malnutrition. Unable to obtain enough information at this time for identification of malnutrition.   NUTRITION - FOCUSED PHYSICAL EXAM:  Unable to complete  Diet Order:   Diet Order             Diet regular Room service appropriate? Yes; Fluid consistency: Thin  Diet effective now                   EDUCATION NEEDS:   Not appropriate for education at this time  Skin:  Skin Assessment: Skin Integrity Issues: Skin Integrity  Issues:: Stage I, Other (Comment) Stage I: buttocks Other: arterial wounds to L & R pretibial areas  Last BM:  10/5 type 6  Height:   Ht Readings from Last 1 Encounters:  07/31/22 5' 7.84" (1.723 m)    Weight:   Wt Readings from Last 1 Encounters:  08/03/22 83.5 kg    Ideal Body Weight:  70 kg  BMI:  Body mass index is 28.13 kg/m.  Estimated Nutritional Needs:   Kcal:  2100-2300  Protein:  100-120 gm  Fluid:  2.1-2.3 L   Lucas Mallow RD, LDN, CNSC Please refer to Amion for contact information.

## 2022-08-03 NOTE — Assessment & Plan Note (Addendum)
Stage 1 pressure ulcer present on admission.  buttock

## 2022-08-03 NOTE — Progress Notes (Addendum)
Progress Note   Patient: Aaron Sosa [id number] DOB: 21-Jun-1941 DOA: 07/31/2022     3 DOS: the patient was seen and examined on 08/03/2022   Brief hospital course: Mr. Anness was admitted to the hospital with the working diagnosis of acute on chronic hypotension.   81 yo male with the past medical hisotory of CAD, chronic heart failure, hypertension, dyslipidemia and T2DM who presented with weakness. Patient reported 2 days of worsening symptoms, positive weight loss 20 lbs in 3 weeks. Recent hospitalization for heart failure, he was diuresed and placed on midodrine due to hypotension (06/30 to 05/09/22). Apparently patient non compliant with his medical therapy, but his daughter restarted torsemide and metolazone a few weeks prior. On his initial physical examination his blood pressure was 79/46, HR 86, RR 19 and 02 saturation 94%, lungs with decreased breath sounds at bases, heart with S1 and S2 present and rhythmic, abdomen no distended and no lower extremity edema. Bilateral leg ulcerated wounds.   Na 125, K 3,5 Cl 67, bicarbonate 35, glucose 423 bun 85 cr 1,42  BNP 3,368  High sensitive troponin 1,352 1,243  Wbc 10,5 hgb 31,1 plt 215  Sars covid 19 and influenza negative  Urine analysis SG 1,007, 0-5 rbc, 0-5 wbc, negative protein.   Chest radiograph with cardiomegaly and bilateral hilar vascular congestion with no infiltrates.   EKG 83 bpm, right axis deviation, 1st degree AV block sinus rhythm with PVC trigeminy, with no significant ST segment or T wave changes.   Patient did received IV fluids for hypotension.       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   Hypovolemic shock.  Possible over diuresis at home.   Systolic blood pressure today 99 to 93 mmHg.  Random cortisol 22,4  No clinical signs of infection.  Plan to increase midodrine to 15 mg tid.  Continue telemetry monitoring, out of bed to chair tid with meals, continue PT and OT.    Acute on chronic combined systolic and diastolic CHF (congestive heart failure) (HCC) Echocardiogram with worsening LV systolic function EF < 123456, with global hypokinesis. RV systolic function with moderate reduction. RVSP 39,0. Left atrium with moderate dilatation. Moderate mitral regurgitation.   Patient with euvolemic today.  Plan to add 500 cc bolus NS and increase midodrine to 15 mg tid.    Limited pharmacologic options due to hypotension and worsening renal function.   Troponin elevation due to heart failure decompensation, no signs of acute coronary syndrome, NSTEMI has been ruled out.    Acute kidney injury superimposed on chronic kidney disease (HCC) CKD stage 3a, Hyponatremia, hypokalemia.  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   Worsening renal function with serum cr at 1,64 with K at 4,2 and serum bicarbonate at 26.  Na 124.  Plan to add 500 cc bolus NS and follow up renal function in am.  Avoid hypotension and nephrotoxic medications.  Liberate diet and continue with protein supplementation.   Type 2 diabetes mellitus with hyperlipidemia (HCC) Hyperglycemia.  Glucose has been stable, this am fasting 208 mg/dl  Plan to continue glucose cover and monitoring with insulin sliding scale.  Continue with statin therapy.    Pressure ulcer of sacrum Stage 1 pressure ulcer present on admission.         Subjective: Patient with headache, no chest pain or dyspnea.   Physical Exam: Vitals:   08/03/22 0416 08/03/22 0500 08/03/22 0804 08/03/22 1200  BP: 99/79  94/66 93/65  Pulse:  88  94 86  Resp: 20  20 19   Temp: (!) 97.4 F (36.3 C)  97.8 F (36.6 C) (!) 97.3 F (36.3 C)  TempSrc: Oral  Oral Oral  SpO2:   97% 97%  Weight:  83.5 kg    Height:       Neurology awake and alert ENT with mild pallor Cardiovascular with S1 and S2 present and rhythmic with no gallops, rubs or murmurs Respiratory with mild rales at bases with no wheezing Abdomen with no  distention  Trace lower extremity edema Positive superficial wounds at his lower extremities.  Data Reviewed:    Family Communication: I spoke with patient's daughter at the bedside, we talked in detail about patient's condition, plan of care and prognosis and all questions were addressed.   Disposition: Status is: Inpatient Remains inpatient appropriate because: hypotension and worsening renal function   Planned Discharge Destination: Home      Author: Tawni Millers, MD 08/03/2022 12:39 PM  For on call review [email or web address].

## 2022-08-03 NOTE — Progress Notes (Signed)
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  He has chronic hypotension.  He was admitted with hypotension, generalized weakness, and likely overdiuresis.  He has remained hypotensive overnight despite getting IV normal saline.      His sodium and potassium have been  low which could all be due to aggressive diuresis with metolazone and torsemide. His daughter was giving him Torsemide 100 mg a day, Metolazone 5 mg QOD . When he is discharged, he will need to be on a lower dose of diuretics .       Inpatient Medications    Scheduled Meds:  aspirin EC  81 mg Oral Daily   clopidogrel  75 mg Oral Daily   feeding supplement  237 mL Oral Q24H   insulin aspart  0-20 Units Subcutaneous TID AC & HS   midodrine  10 mg Oral TID WC   multivitamin with minerals  1 tablet Oral Daily   rosuvastatin  20 mg Oral QHS   Continuous Infusions:   PRN Meds: acetaminophen, ALPRAZolam, calcium carbonate, nitroGLYCERIN, ondansetron (ZOFRAN) IV, senna-docusate, traZODone   Vital Signs    Vitals:   08/03/22 0327 08/03/22 0416 08/03/22 0500 08/03/22 0804  BP: 95/77 99/79  94/66  Pulse: 85 88  94  Resp: (!) 27 20  20   Temp: (!) 97.5 F (36.4 C) (!) 97.4 F (36.3 C)  97.8 F (36.6 C)  TempSrc: Oral Oral  Oral  SpO2: 99%   97%  Weight:   83.5 kg   Height:        Intake/Output Summary (Last 24 hours) at 08/03/2022 0959 Last data filed at 08/03/2022 [id number] Gross per 24 hour  Intake 460 ml  Output 800 ml  Net -340 ml       08/03/2022    5:00 AM 07/31/2022   10:00 PM 07/14/2022    1:28 PM  Last 3 Weights  Weight (lbs) 184 lb 1.4 oz 182 lb 12.2 oz 180 lb 8 oz  Weight (kg) 83.5 kg 82.9 kg 81.874 kg      Telemetry    - Personally Reviewed  ECG      NSR - Personally Reviewed  Physical Exam   GEN: elderly male   Neck: No JVD Cardiac:  RR  Respiratory: Clear to auscultation bilaterally. GI: Soft, nontender, non-distended  MS: No edema; No deformity. Neuro:  Nonfocal  Psych: Normal affect   Labs    High Sensitivity Troponin:   Recent Labs  Lab 07/31/22 1600 07/31/22 1800  TROPONINIHS 1,351* 1,243*      Chemistry Recent Labs  Lab 07/31/22 1600 07/31/22 1800 07/31/22 2055 08/01/22 0856 08/02/22 0121 08/03/22 0112  NA 120*  --    < > 123* 126* 124*  K 2.5*  --    < > 2.8* 3.9 4.2  CL 67*  --   --  76* 81* 84*  CO2 35*  --   --  30 27 26   GLUCOSE 423*  --   --  275* 130* 208*  BUN 85*  --   --  78* 80* 87*  CREATININE 1.42*  --   --  1.21 1.36* 1.64*  CALCIUM 9.3  --   --  8.6* 8.6* 8.5*  MG  --  2.7*  --   --   --   --  PROT 7.3  --   --   --  6.2*  --   ALBUMIN 3.5  --   --   --  3.2*  --   AST 36  --   --   --  51*  --   ALT 19  --   --   --  29  --   ALKPHOS 75  --   --   --  89  --   BILITOT 1.2  --   --   --  1.3*  --   GFRNONAA 50*  --   --  >60 52* 42*  ANIONGAP 18*  --   --  17* 18* 14   < > = values in this interval not displayed.     Lipids  Recent Labs  Lab 08/01/22 0316  CHOL 101  TRIG 51  HDL 49  LDLCALC 42  CHOLHDL 2.1     Hematology Recent Labs  Lab 07/31/22 1600 07/31/22 2055 08/01/22 0316 08/02/22 0121  WBC 10.5  --  8.5 10.3  RBC 4.72  --  4.38 4.58  HGB 13.1 19.0* 12.3* 12.6*  HCT 38.8* 56.0* 35.4* 37.7*  MCV 82.2  --  80.8 82.3  MCH 27.8  --  28.1 27.5  MCHC 33.8  --  34.7 33.4  RDW 16.1*  --  16.1* 16.9*  PLT 215  --  203 230    Thyroid No results for input(s): "TSH", "FREET4" in the last 168 hours.  BNP Recent Labs  Lab 07/31/22 1600  BNP 3,368.9*     DDimer No results for input(s): "DDIMER" in the last 168 hours.   Radiology    ECHOCARDIOGRAM COMPLETE  Result Date: 08/02/2022    ECHOCARDIOGRAM REPORT   Patient Name:   HUZAIFAH HOGGATT Date of Exam:  08/02/2022 Medical Rec #:  [id number]     Height:       67.8 in Accession #:    [id number]    Weight:       182.8 lb Date of Birth:  1941-07-29      BSA:          1.964 m Patient Age:    61 years      BP:           91/65 mmHg Patient Gender: M             HR:           82 bpm. Exam Location:  Inpatient Procedure: 2D Echo, Cardiac Doppler, Color Doppler and Intracardiac            Opacification Agent Indications:    NSTEMI  History:        Patient has prior history of Echocardiogram examinations, most                 recent 11/06/2021. CHF, CAD; Risk Factors:Diabetes.  Sonographer:    Memory Argue Referring Phys: [id number] JAN A Seibert  1. Left ventricular ejection fraction, by estimation, is <20%. The left ventricle has severely decreased function. The left ventricle demonstrates global hypokinesis. The left ventricular internal cavity size was mildly dilated. Left ventricular diastolic parameters are consistent with Grade II diastolic dysfunction (pseudonormalization). No LV thrombus noted.  2. Right ventricular systolic function is moderately reduced. The right ventricular size is normal. There is mildly elevated pulmonary artery systolic pressure. The estimated right ventricular systolic pressure is 123XX123 mmHg.  3. Left atrial size was moderately dilated.  4. Right  atrial size was mildly dilated.  5. The mitral valve is abnormal. Moderate mitral valve regurgitation. No evidence of mitral stenosis.  6. The aortic valve is tricuspid. There is moderate calcification of the aortic valve. Aortic valve regurgitation is not visualized. No aortic stenosis is present.  7. The inferior vena cava is dilated in size with <50% respiratory variability, suggesting right atrial pressure of 15 mmHg. FINDINGS  Left Ventricle: Left ventricular ejection fraction, by estimation, is <20%. The left ventricle has severely decreased function. The left ventricle demonstrates global hypokinesis. The left ventricular internal cavity  size was mildly dilated. There is no  left ventricular hypertrophy. Left ventricular diastolic parameters are consistent with Grade II diastolic dysfunction (pseudonormalization). Right Ventricle: The right ventricular size is normal. No increase in right ventricular wall thickness. Right ventricular systolic function is moderately reduced. There is mildly elevated pulmonary artery systolic pressure. The tricuspid regurgitant velocity is 2.45 m/s, and with an assumed right atrial pressure of 15 mmHg, the estimated right ventricular systolic pressure is 70.3 mmHg. Left Atrium: Left atrial size was moderately dilated. Right Atrium: Right atrial size was mildly dilated. Pericardium: There is no evidence of pericardial effusion. Mitral Valve: The mitral valve is abnormal. There is mild calcification of the mitral valve leaflet(s). Moderate mitral valve regurgitation. No evidence of mitral valve stenosis. Tricuspid Valve: The tricuspid valve is normal in structure. Tricuspid valve regurgitation is trivial. Aortic Valve: The aortic valve is tricuspid. There is moderate calcification of the aortic valve. Aortic valve regurgitation is not visualized. No aortic stenosis is present. Aortic valve mean gradient measures 2.0 mmHg. Aortic valve peak gradient measures 2.8 mmHg. Aortic valve area, by VTI measures 2.35 cm. Pulmonic Valve: The pulmonic valve was normal in structure. Pulmonic valve regurgitation is trivial. Aorta: The aortic root is normal in size and structure. Venous: The inferior vena cava is dilated in size with less than 50% respiratory variability, suggesting right atrial pressure of 15 mmHg. IAS/Shunts: No atrial level shunt detected by color flow Doppler.  LEFT VENTRICLE PLAX 2D LVIDd:         5.50 cm      Diastology LVIDs:         4.90 cm      LV e' medial:    4.51 cm/s LV PW:         1.00 cm      LV E/e' medial:  18.0 LV IVS:        1.00 cm      LV e' lateral:   10.30 cm/s LVOT diam:     2.20 cm      LV  E/e' lateral: 7.9 LV SV:         35 LV SV Index:   18 LVOT Area:     3.80 cm  LV Volumes (MOD) LV vol d, MOD A2C: 204.0 ml LV vol d, MOD A4C: 164.0 ml LV vol s, MOD A2C: 171.0 ml LV vol s, MOD A4C: 136.0 ml LV SV MOD A2C:     33.0 ml LV SV MOD A4C:     164.0 ml LV SV MOD BP:      32.7 ml RIGHT VENTRICLE RV S prime:     6.06 cm/s LEFT ATRIUM             Index        RIGHT ATRIUM           Index LA diam:        5.40 cm  2.75 cm/m   RA Area:     21.90 cm LA Vol (A2C):   72.3 ml 36.82 ml/m  RA Volume:   59.90 ml  30.50 ml/m LA Vol (A4C):   78.6 ml 40.03 ml/m LA Biplane Vol: 86.2 ml 43.90 ml/m  AORTIC VALVE AV Area (Vmax):    2.56 cm AV Area (Vmean):   2.07 cm AV Area (VTI):     2.35 cm AV Vmax:           83.60 cm/s AV Vmean:          65.900 cm/s AV VTI:            0.150 m AV Peak Grad:      2.8 mmHg AV Mean Grad:      2.0 mmHg LVOT Vmax:         56.20 cm/s LVOT Vmean:        35.900 cm/s LVOT VTI:          0.093 m LVOT/AV VTI ratio: 0.62  AORTA Ao Root diam: 3.50 cm Ao Asc diam:  3.50 cm MITRAL VALVE                 TRICUSPID VALVE MV Area (PHT): 3.16 cm      TR Peak grad:   24.0 mmHg MV Decel Time: 240 msec      TR Vmax:        245.00 cm/s MR Peak grad:   50.5 mmHg MR Mean grad:   30.0 mmHg    SHUNTS MR Vmax:        355.33 cm/s  Systemic VTI:  0.09 m MR Vmean:       255.0 cm/s   Systemic Diam: 2.20 cm MR PISA:        0.57 cm MR PISA Radius: 0.30 cm MV E velocity: 81.20 cm/s MV A velocity: 50.10 cm/s MV E/A ratio:  1.62 Dalton McleanMD Electronically signed by Franki Monte Signature Date/Time: 08/02/2022/2:08:14 PM    Final     Cardiac Studies     Patient Profile     81 y.o. male   Assessment & Plan    Hypotension: improving   The hypotension has improved with gentle hydration  Cortisol levels were normal - suggests no evidence of     2.  Chronic combined systolic and diastolic congestive heart failure: He has an ischemic cardiomyopathy.  EF is around 25%.  This is unchanged from the past  several years. He was over diuresed when he presented  He will need a lower dose of torsemide and metalazone at the time of DC   I think he needs additional IV normal saline I suspect he has a renal issue  3.  Troponin elevation: c/W his severe CAD and CHF.  He has been determined to NOT be a candidate for PCI or CABG.        For questions or updates, please contact [place] Please consult [email or web address] for contact info under        Signed, Mertie Moores, MD  08/03/2022, 9:59 AM

## 2022-08-03 NOTE — Evaluation (Signed)
Physical Therapy Evaluation Patient Details Name: Aaron Sosa MRN: [id number] DOB: 1940/11/11 Today's Date: 08/03/2022  History of Present Illness  Pt is an 81 y.o. male admitted 07/31/22 with weakness and weight loss. Workup for acute on chronic CHF, hypotension, possible over-diuresis at home. PMH includes arthritis, CHF, DM, HOH, obesity, myalgia.   Clinical Impression  Pt presents with an overall decrease in functional mobility secondary to above. PTA, pt ambulating with rollator, lives alone with daughter who checks on him often between work schedule; daughter reports plan to provide 24/7 support for pt at d/c to allow him to return home (instead of SNF rehab). Today, pt ambulating short distance with RW and min guard for balance; bladder/bowel incontinence and fatigue limiting further mobility. Pt would benefit from continued acute PT services to maximize functional mobility and independence prior to d/c with HHPT services.      Recommendations for follow up therapy are one component of a multi-disciplinary discharge planning process, led by the attending physician.  Recommendations may be updated based on patient status, additional functional criteria and insurance authorization.  Follow Up Recommendations Home health PT      Assistance Recommended at Discharge Frequent or constant Supervision/Assistance  Patient can return home with the following  A little help with bathing/dressing/bathroom;Assistance with cooking/housework;Direct supervision/assist for medications management;Assist for transportation;Help with stairs or ramp for entrance    Equipment Recommendations None recommended by PT  Recommendations for Other Services   Mobility Specialist   Functional Status Assessment Patient has had a recent decline in their functional status and demonstrates the ability to make significant improvements in function in a reasonable and predictable amount of time.     Precautions /  Restrictions Precautions Precautions: Fall;Other (comment) Precaution Comments: bladder/bowel incontinence with mobility Restrictions Weight Bearing Restrictions: No      Mobility  Bed Mobility Overal bed mobility: Modified Independent             General bed mobility comments: HOB elevated    Transfers Overall transfer level: Needs assistance Equipment used: Rolling walker (2 wheels) Transfers: Sit to/from Stand Sit to Stand: Min guard           General transfer comment: initially unsuccessful stand with pt pulling on walker, cues for hand placement then able to perform 2x sit<>stand from EOB to RW with min guard; good eccentric control going to sit in recliner    Ambulation/Gait Ambulation/Gait assistance: Min guard Gait Distance (Feet): 14 Feet Assistive device: Rolling walker (2 wheels) Gait Pattern/deviations: Step-through pattern, Decreased stride length, Trunk flexed Gait velocity: Decreased     General Gait Details: slow, fatigued gait with RW and min guard for balance, DOE 3/4; further distance limited by bowel incontinence requiring washup/linen change, pt fatigued with this  Stairs            Wheelchair Mobility    Modified Rankin (Stroke Patients Only)       Balance Overall balance assessment: Needs assistance Sitting-balance support: No upper extremity supported, Feet supported Sitting balance-Leahy Scale: Fair Sitting balance - Comments: daughter assisting to don shoes, reports this is baseline   Standing balance support: During functional activity, Single extremity supported, Bilateral upper extremity supported Standing balance-Leahy Scale: Poor Standing balance comment: reliant on UE support; assist provided for pericare due to incontinence, but pt reports able to wipe himself at home  Pertinent Vitals/Pain Pain Assessment Pain Assessment: Faces Faces Pain Scale: Hurts a little bit Pain  Location: back Pain Descriptors / Indicators: Discomfort Pain Intervention(s): Monitored during session    Home Living Family/patient expects to be discharged to:: Private residence Living Arrangements: Children Available Help at Discharge: Family;Available PRN/intermittently Type of Home: House Home Access: Ramped entrance       Home Layout: One level Home Equipment: Conservation officer, nature (2 wheels);Rollator (4 wheels);Cane - single point;Crutches;BSC/3in1;Grab bars - tub/shower;Wheelchair - manual Additional Comments: daughter plans to quit job in order to provide 24/7 support at home; can have assist from pt's sisters as well    Prior Function Prior Level of Function : Independent/Modified Independent;History of Falls (last six months)             Mobility Comments: mod indep ambulating short distances with rollator ADLs Comments: daughter assists with donning shoes, household tasks; daughter reports pt mod indep with bathing and pericare     Hand Dominance        Extremity/Trunk Assessment   Upper Extremity Assessment Upper Extremity Assessment: Generalized weakness    Lower Extremity Assessment Lower Extremity Assessment: Generalized weakness    Cervical / Trunk Assessment Cervical / Trunk Assessment: Kyphotic  Communication   Communication: HOH  Cognition Arousal/Alertness: Awake/alert Behavior During Therapy: WFL for tasks assessed/performed Overall Cognitive Status: Within Functional Limits for tasks assessed                                 General Comments: WFL for simple tasks, limited by [facility]        General Comments General comments (skin integrity, edema, etc.): pt's daughter [name]) present and supportive - increased time discussing d/c plan of short-term SNF rehab vs return home - daughter and pt ultimately desire for him to return home, she plans to provide 24/7 support and supervision; daughter has good awareness of need for this for pt  safety/mobility    Exercises     Assessment/Plan    PT Assessment Patient needs continued PT services  PT Problem List Decreased strength;Decreased activity tolerance;Decreased balance;Decreased mobility;Decreased knowledge of use of DME       PT Treatment Interventions DME instruction;Gait training;Functional mobility training;Therapeutic activities;Therapeutic exercise;Balance training;Patient/family education    PT Goals (Current goals can be found in the Care Plan section)  Acute Rehab PT Goals Patient Stated Goal: return home with assist from family PT Goal Formulation: With patient/family Time For Goal Achievement: 08/17/22 Potential to Achieve Goals: Good    Frequency Min 3X/week     Co-evaluation               AM-PAC PT "6 Clicks" Mobility  Outcome Measure Help needed turning from your back to your side while in a flat bed without using bedrails?: None Help needed moving from lying on your back to sitting on the side of a flat bed without using bedrails?: A Little Help needed moving to and from a bed to a chair (including a wheelchair)?: A Little Help needed standing up from a chair using your arms (e.g., wheelchair or bedside chair)?: A Little Help needed to walk in hospital room?: Total Help needed climbing 3-5 steps with a railing? : Total 6 Click Score: 15    End of Session Equipment Utilized During Treatment: Gait belt Activity Tolerance: Patient tolerated treatment well;Patient limited by fatigue Patient left: in chair;with call bell/phone within reach;with chair alarm set;with family/visitor  present Nurse Communication: Mobility status PT Visit Diagnosis: Other abnormalities of gait and mobility (R26.89);Muscle weakness (generalized) (M62.81)    Time: 8295-6213 PT Time Calculation (min) (ACUTE ONLY): 28 min   Charges:   PT Evaluation $PT Eval Moderate Complexity: 1 Mod PT Treatments $Self Care/Home Management: 8-22       Mabeline Caras, PT,  DPT Acute Rehabilitation Services  Personal: [facility] Office: [place] 08/03/2022, 11:11 AM

## 2022-08-03 NOTE — Progress Notes (Signed)
Pharmacist Heart Failure Core Measure Documentation  Assessment: [name] has an EF documented as < 20% on 08/02/22 by Echo.  Rationale: Heart failure patients with left ventricular systolic dysfunction (LVSD) and an EF < 40% should be prescribed an angiotensin converting enzyme inhibitor (ACEI) or angiotensin receptor blocker (ARB) at discharge unless a contraindication is documented in the medical record.  This patient is not currently on an ACEI or ARB for HF.  This note is being placed in the record in order to provide documentation that a contraindication to the use of these agents is present for this encounter.  ACE Inhibitor or Angiotensin Receptor Blocker is contraindicated (specify all that apply)  []   ACEI allergy AND ARB allergy []   Angioedema []   Moderate or severe aortic stenosis []   Hyperkalemia []   Hypotension []   Renal artery stenosis [x]   Worsening renal function, preexisting renal disease or dysfunction  Hildred Laser, PharmD Clinical Pharmacist **Pharmacist phone directory can now be found on [email or web address] ([email or web address]).  Listed under [place].

## 2022-08-03 NOTE — Evaluation (Addendum)
Occupational Therapy Evaluation Patient Details Name: Aaron Sosa MRN: [id number] DOB: [birth year] Today's Date: 08/03/2022   History of Present Illness Pt is an 81 y.o. male admitted 07/31/22 with weakness and weight loss. Workup for acute on chronic CHF, hypotension, possible over-diuresis at home. PMH includes arthritis, CHF, DM, HOH, obesity, myalgia.   Clinical Impression   Pt needing assist at baseline for ADLs (LB dressing) and uses rollator/wheelchair for mobility.  Pt lives with daughter who will be available 24/7 to assist pt at d/c. Pt currently needing min -mod A for ADLs, and min guard for transfers with RW, pt able to complete x3 sit to stand transfers during session for pericare and donning brief, pt declining further ADLs/mobility at this time. Pt presenting with impairments listed below, will follow acutely. Recommend HHOT at d/c.      Recommendations for follow up therapy are one component of a multi-disciplinary discharge planning process, led by the attending physician.  Recommendations may be updated based on patient status, additional functional criteria and insurance authorization.   Follow Up Recommendations  Home health OT    Assistance Recommended at Discharge Frequent or constant Supervision/Assistance  Patient can return home with the following A little help with walking and/or transfers;A lot of help with bathing/dressing/bathroom;Assistance with cooking/housework;Direct supervision/assist for medications management;Direct supervision/assist for financial management;Assist for transportation;Help with stairs or ramp for entrance    Functional Status Assessment  Patient has had a recent decline in their functional status and demonstrates the ability to make significant improvements in function in a reasonable and predictable amount of time.  Equipment Recommendations  Other (comment) (RW)    Recommendations for Other Services PT consult     Precautions /  Restrictions Precautions Precautions: Fall;Other (comment) Precaution Comments: bladder/bowel incontinence with mobility Restrictions Weight Bearing Restrictions: No      Mobility Bed Mobility               General bed mobility comments: seated EOB upon arrival an departure    Transfers Overall transfer level: Needs assistance Equipment used: Rolling walker (2 wheels) Transfers: Sit to/from Stand Sit to Stand: Min guard                  Balance Overall balance assessment: Needs assistance Sitting-balance support: No upper extremity supported, Feet supported Sitting balance-Leahy Scale: Fair Sitting balance - Comments: daughter assisting to don shoes, reports this is baseline   Standing balance support: During functional activity, Single extremity supported, Bilateral upper extremity supported Standing balance-Leahy Scale: Poor                             ADL either performed or assessed with clinical judgement   ADL Overall ADL's : Needs assistance/impaired Eating/Feeding: Modified independent   Grooming: Minimal assistance   Upper Body Bathing: Moderate assistance   Lower Body Bathing: Moderate assistance   Upper Body Dressing : Minimal assistance   Lower Body Dressing: Moderate assistance   Toilet Transfer: Minimal assistance   Toileting- Clothing Manipulation and Hygiene: Maximal assistance       Functional mobility during ADLs: Minimal assistance;Rolling walker (2 wheels)       Vision   Vision Assessment?: No apparent visual deficits     Perception     Praxis      Pertinent Vitals/Pain Pain Assessment Pain Assessment: Faces Pain Score: 2  Faces Pain Scale: Hurts a little bit Pain Location: back Pain Descriptors / Indicators:  Discomfort Pain Intervention(s): Limited activity within patient's tolerance, Monitored during session, Repositioned     Hand Dominance     Extremity/Trunk Assessment Upper Extremity  Assessment Upper Extremity Assessment: Generalized weakness   Lower Extremity Assessment Lower Extremity Assessment: Generalized weakness   Cervical / Trunk Assessment Cervical / Trunk Assessment: Kyphotic   Communication Communication Communication: HOH   Cognition Arousal/Alertness: Awake/alert Behavior During Therapy: WFL for tasks assessed/performed Overall Cognitive Status: Within Functional Limits for tasks assessed                                 General Comments: WFL for simple tasks, limited by [facility]     General Comments  VSS on RA, pt's daughter in room, present and supportive    Exercises     Shoulder Instructions      Home Living Family/patient expects to be discharged to:: Private residence Living Arrangements: Children Available Help at Discharge: Family;Available PRN/intermittently Type of Home: House Home Access: Ramped entrance     Home Layout: One level     Bathroom Shower/Tub: Producer, television/film/video: Standard     Home Equipment: BSC/3in1;Wheelchair - Forensic psychologist (2 wheels);Cane - single point;Rollator (4 wheels);Grab bars - toilet;Grab bars - tub/shower   Additional Comments: daughter plans to quit job in order to provide 24/7 support at home; can have assist from pt's sisters as well      Prior Functioning/Environment Prior Level of Function : Independent/Modified Independent;History of Falls (last six months)             Mobility Comments: mod indep ambulating short distances with rollator ADLs Comments: daughter assists with donning shoes, household tasks; daughter reports pt mod indep with bathing and pericare        OT Problem List: Decreased strength;Decreased range of motion;Decreased activity tolerance;Impaired balance (sitting and/or standing);Decreased safety awareness      OT Treatment/Interventions: Self-care/ADL training;Therapeutic exercise;Energy conservation;DME and/or AE  instruction;Therapeutic activities;Patient/family education;Balance training    OT Goals(Current goals can be found in the care plan section) Acute Rehab OT Goals Patient Stated Goal: none stated OT Goal Formulation: With patient Time For Goal Achievement: 08/17/22 Potential to Achieve Goals: Good ADL Goals Pt Will Perform Grooming: with modified independence;standing Pt Will Perform Upper Body Dressing: with modified independence;sitting Pt Will Perform Lower Body Dressing: with min assist;sit to/from stand;sitting/lateral leans Pt Will Transfer to Toilet: with supervision;regular height toilet;ambulating  OT Frequency: Min 2X/week    Co-evaluation              AM-PAC OT "6 Clicks" Daily Activity     Outcome Measure Help from another person eating meals?: None Help from another person taking care of personal grooming?: A Little Help from another person toileting, which includes using toliet, bedpan, or urinal?: A Lot Help from another person bathing (including washing, rinsing, drying)?: A Lot Help from another person to put on and taking off regular upper body clothing?: A Little Help from another person to put on and taking off regular lower body clothing?: A Lot 6 Click Score: 16   End of Session Equipment Utilized During Treatment: Rolling walker (2 wheels) Nurse Communication: Mobility status (pt's IV beeping and pt needs new bandage on sacrum)  Activity Tolerance: Patient tolerated treatment well Patient left: in bed;with call bell/phone within reach;with family/visitor present  OT Visit Diagnosis: Unsteadiness on feet (R26.81);Other abnormalities of gait and mobility (R26.89);Muscle weakness (generalized) (M62.81)  Time: 8110-3159 OT Time Calculation (min): 18 min Charges:  OT General Charges $OT Visit: 1 Visit OT Evaluation $OT Eval Moderate Complexity: 1 [address] OTD, OTR/L Acute Rehab [phone]) 832 - [place] 08/03/2022,  3:40 PM

## 2022-08-04 DIAGNOSIS — L89159 Pressure ulcer of sacral region, unspecified stage: Secondary | ICD-10-CM

## 2022-08-04 DIAGNOSIS — I5043 Acute on chronic combined systolic (congestive) and diastolic (congestive) heart failure: Secondary | ICD-10-CM | POA: Diagnosis not present

## 2022-08-04 DIAGNOSIS — N179 Acute kidney failure, unspecified: Secondary | ICD-10-CM | POA: Diagnosis not present

## 2022-08-04 DIAGNOSIS — I9589 Other hypotension: Secondary | ICD-10-CM | POA: Diagnosis not present

## 2022-08-04 DIAGNOSIS — E1169 Type 2 diabetes mellitus with other specified complication: Secondary | ICD-10-CM | POA: Diagnosis not present

## 2022-08-04 DIAGNOSIS — R4189 Other symptoms and signs involving cognitive functions and awareness: Secondary | ICD-10-CM | POA: Diagnosis present

## 2022-08-04 LAB — GLUCOSE, CAPILLARY
Glucose-Capillary: 118 mg/dL — ABNORMAL HIGH (ref 70–99)
Glucose-Capillary: 179 mg/dL — ABNORMAL HIGH (ref 70–99)
Glucose-Capillary: 149 mg/dL — ABNORMAL HIGH (ref 70–99)
Glucose-Capillary: 178 mg/dL — ABNORMAL HIGH (ref 70–99)

## 2022-08-04 LAB — BASIC METABOLIC PANEL
Anion gap: 12 (ref 5–15)
BUN: 84 mg/dL — ABNORMAL HIGH (ref 8–23)
CO2: 28 mmol/L (ref 22–32)
Calcium: 8.8 mg/dL — ABNORMAL LOW (ref 8.9–10.3)
Chloride: 86 mmol/L — ABNORMAL LOW (ref 98–111)
Creatinine, Ser: 1.54 mg/dL — ABNORMAL HIGH (ref 0.61–1.24)
GFR, Estimated: 45 mL/min — ABNORMAL LOW (ref >60)
Glucose, Bld: 132 mg/dL — ABNORMAL HIGH (ref 70–99)
Potassium: 3.7 mmol/L (ref 3.5–5.1)
Sodium: 126 mmol/L — ABNORMAL LOW (ref 135–145)

## 2022-08-04 LAB — MAGNESIUM: Magnesium: 3 mg/dL — ABNORMAL HIGH (ref 1.7–2.4)

## 2022-08-04 MED ORDER — POLYVINYL ALCOHOL 1.4 % OP SOLN
1.0000 [drp] | OPHTHALMIC | Status: DC | PRN
  Administered 2022-08-04: 1 [drp] via OPHTHALMIC
  Filled 2022-08-04: qty 15

## 2022-08-04 NOTE — Progress Notes (Addendum)
Progress Note   Patient: Aaron Sosa QIH:[id number] DOB: 1941/10/04 DOA: 07/31/2022     4 DOS: the patient was seen and examined on 08/04/2022   Brief hospital course: Aaron Sosa was admitted to the hospital with the working diagnosis of acute on chronic hypotension.   81 yo male with the past medical hisotory of CAD, chronic heart failure, hypertension, dyslipidemia and T2DM who presented with weakness. Patient reported 2 days of worsening symptoms, positive weight loss 20 lbs in 3 weeks. Recent hospitalization for heart failure, he was diuresed and placed on midodrine due to hypotension (06/30 to 05/09/22). Apparently patient non compliant with his medical therapy, but his daughter restarted torsemide and metolazone a few weeks prior. On his initial physical examination his blood pressure was 79/46, HR 86, RR 19 and 02 saturation 94%, lungs with decreased breath sounds at bases, heart with S1 and S2 present and rhythmic, abdomen no distended and no lower extremity edema. Bilateral leg ulcerated wounds.   Na 125, K 3,5 Cl 67, bicarbonate 35, glucose 423 bun 85 cr 1,42  BNP 3,368  High sensitive troponin 1,352 1,243  Wbc 10,5 hgb 31,1 plt 215  Sars covid 19 and influenza negative  Urine analysis SG 1,007, 0-5 rbc, 0-5 wbc, negative protein.   Chest radiograph with cardiomegaly and bilateral hilar vascular congestion with no infiltrates.   EKG 83 bpm, right axis deviation, 1st degree AV block sinus rhythm with PVC trigeminy, with no significant ST segment or T wave changes.   Patient did received IV fluids for hypotension.   10/06 renal function and serum Na have improved, but not back to baseline.      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   Hypovolemic shock.  Possible over diuresis at home.   Random cortisol 22,4  No clinical signs of infection.  Systolic blood pressure 87 to 99 mmHg.   Plan to increase midodrine to 15 mg tid.  Continue  telemetry monitoring, out of bed to chair tid with meals, continue PT and OT.  Patient very debilitated and poor prognosis, I spoke with her daughter today at the bedside and will add palliative care.   Acute on chronic combined systolic and diastolic CHF (congestive heart failure) (HCC) Echocardiogram with worsening LV systolic function EF < 87%, with global hypokinesis. RV systolic function with moderate reduction. RVSP 39,0. Left atrium with moderate dilatation. Moderate mitral regurgitation.   Patient has received IV fluids Today with no signs of volume overload.   Plan to continue with high dose of midodrine Continue to hold on diuretic therapy.   Limited pharmacologic options due to hypotension and worsening renal function.   Troponin elevation due to heart failure decompensation, no signs of acute coronary syndrome, NSTEMI has been ruled out.    Acute kidney injury superimposed on chronic kidney disease (HCC) CKD stage 3a, Hyponatremia, hypokalemia.  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   Renal function today with serum cr at 1.54 with K at 3,7 and serum bicarbonate at 28.  Na 126 and Mg 3,0  Plan to continue supportive medical therapy with midodrine Follow up renal function and electrolytes in am.  Hold on diuretic therapy for now.    Type 2 diabetes mellitus with hyperlipidemia (HCC) Uncontrolled with Hyperglycemia.  Glucose has been stable, this am fasting 132 mg/dl  Plan to continue glucose cover and monitoring with insulin sliding scale.  Continue with statin therapy.    Cognitive impairment Continue supportive medical care No  agitation Patient with poor prognosis, very debilitated. At home uses wheelchair with very poor mobility Will add palliative care services at the time of his discharge.   Pressure ulcer of sacrum Stage 1 pressure ulcer present on admission.         Subjective: Patient very weak and deconditioned, denies chest pain or  dyspnea   Physical Exam: Vitals:   08/03/22 1931 08/03/22 2309 08/04/22 0342 08/04/22 0824  BP: 99/81 90/71 (!) 87/63 (!) 88/68  Pulse:  81  89  Resp: 18 16 16 17   Temp: 97.6 F (36.4 C) 98 F (36.7 C) (!) 97.4 F (36.3 C) (!) 97.4 F (36.3 C)  TempSrc: Oral Oral Oral Oral  SpO2: 98% 98% 98% 97%  Weight:   86.8 kg   Height:       Neurology awake and alert, positive confusion but not agitation  ENT with mild pallor Cardiovascular with S1 and S2 present and rhythmic with no gallops, rubs or murmurs Respiratory with no rales or wheezing Abdomen not distended Lower extremity with trace edema Lower extremity ulcerated wounds with no signs of local infection  Data Reviewed:    Family Communication: I spoke with patient's daughter at the bedside, we talked in detail about patient's condition, plan of care and prognosis and all questions were addressed.   Disposition: Status is: Inpatient Remains inpatient appropriate because: renal failure   Planned Discharge Destination: Home      Author: Tawni Millers, MD 08/04/2022 11:29 AM  For on call review [email or web address].

## 2022-08-04 NOTE — Progress Notes (Signed)
[facility])  Outpatient palliative referral received.  Will f/u with patient once he is back home.  Thank you, Venia Carbon DNP, RN [facility] Liaison

## 2022-08-04 NOTE — Assessment & Plan Note (Addendum)
Patient was not able to sleep last night, this am is confused and with mild agitation His daughter is at the bedside.   Will hold on trazodone, alprazolam and oxycodone Continue supportive medical therapy, patient with acute metabolic encephalopathy and acute delirium.   Patient with poor prognosis, very debilitated. At home uses wheelchair with very poor mobility Follow up with palliative care services at the time of his discharge.

## 2022-08-04 NOTE — Progress Notes (Signed)
Physical Therapy Treatment Patient Details Name: Aaron Sosa MRN: [id number] DOB: 11-Aug-1941 Today's Date: 08/04/2022   History of Present Illness Pt is an 81 y.o. male admitted 07/31/22 with weakness and weight loss. Workup for acute on chronic CHF, hypotension, possible over-diuresis at home. PMH includes arthritis, CHF, DM, HOH, obesity, myalgia.    PT Comments    Pt reports not feeling like OOB activity today , despite encouragement and education from PT and daughter.  All VSS with BP 106/80 (this is higher than pt has been).  Pt did participate in sitting exercises with rest breaks.     Recommendations for follow up therapy are one component of a multi-disciplinary discharge planning process, led by the attending physician.  Recommendations may be updated based on patient status, additional functional criteria and insurance authorization.  Follow Up Recommendations  Home health PT     Assistance Recommended at Discharge Frequent or constant Supervision/Assistance  Patient can return home with the following A little help with bathing/dressing/bathroom;Assistance with cooking/housework;Direct supervision/assist for medications management;Assist for transportation;Help with stairs or ramp for entrance   Equipment Recommendations  None recommended by PT    Recommendations for Other Services       Precautions / Restrictions Precautions Precautions: Fall;Other (comment) Precaution Comments: bladder/bowel incontinence with mobility; hypotensive     Mobility  Bed Mobility               General bed mobility comments: seated EOB upon arrival an departure - declined further OOB activity    Transfers                        Ambulation/Gait                   Stairs             Wheelchair Mobility    Modified Rankin (Stroke Patients Only)       Balance Overall balance assessment: Needs assistance Sitting-balance support: No upper extremity  supported, Feet supported Sitting balance-Leahy Scale: Good                                      Cognition Arousal/Alertness: Awake/alert Behavior During Therapy: WFL for tasks assessed/performed Overall Cognitive Status: Within Functional Limits for tasks assessed                                 General Comments: WFL for simple tasks, limited by Aaron Sosa        Exercises Other Exercises Other Exercises: EOB bil 10x2 AROM: LAQ, marching, hip abd pillow squeeze, reaching across body different heights.  Rest breaks as needed. Pt with DOE of 3/4 with EOB exercises that resolved quickly.    General Comments General comments (skin integrity, edema, etc.): Pt sitting EOB.  Daughter present.  Pt declining any standing, walking, transfers, or OOB activity due to "trying to get my body right."  Tried to have pt further explain but he was unable other than just stating weak.  His BP was 106/80, HR 80's, O2 sats >95%.  All VSS.  Educated that activity would help improve his strength, and inactivity further contribute to weakness.  Daughter also tried to encourage but pt politely stated "not right now."  Did agree to exercise.      Pertinent Vitals/Pain Pain Assessment  Pain Assessment: No/denies pain    Home Living                          Prior Function            PT Goals (current goals can now be found in the care plan section) Progress towards PT goals: Progressing toward goals    Frequency    Min 3X/week      PT Plan Current plan remains appropriate    Co-evaluation              AM-PAC PT "6 Clicks" Mobility   Outcome Measure  Help needed turning from your back to your side while in a flat bed without using bedrails?: None Help needed moving from lying on your back to sitting on the side of a flat bed without using bedrails?: A Little Help needed moving to and from a bed to a chair (including a wheelchair)?: A Little Help needed  standing up from a chair using your arms (e.g., wheelchair or bedside chair)?: A Little Help needed to walk in hospital room?: Total Help needed climbing 3-5 steps with a railing? : Total 6 Click Score: 15    End of Session Equipment Utilized During Treatment: Gait belt Activity Tolerance: Patient limited by fatigue Patient left: in bed;with call bell/phone within reach;with family/visitor present Nurse Communication: Mobility status PT Visit Diagnosis: Other abnormalities of gait and mobility (R26.89);Muscle weakness (generalized) (M62.81)     Time: 3546-5681 PT Time Calculation (min) (ACUTE ONLY): 13 min  Charges:  $Therapeutic Exercise: 8-22 mins                     Abran Richard, PT Acute Rehab [name] Rehab [phone]    Karlton Lemon 08/04/2022, 2:23 PM

## 2022-08-04 NOTE — Progress Notes (Signed)
Rounding Note    Patient Name: Aaron Sosa Date of Encounter: 08/04/2022  [facility]ologist: [facility] in [place]   Subjective   81 yo with severe combined systolic and diastolic congestive heart failure due to an ischemic cardiomyopathy.  He has chronic hypotension.  He was admitted with hypotension, generalized weakness, and likely overdiuresis.  He has remained hypotensive overnight despite getting IV normal saline.      His sodium and potassium have been  low which could all be due to aggressive diuresis with metolazone and torsemide.   He has remained hypotensive , despite IV NS I do not think he will tolerate standard CHF meds . He has been on Entresto in the past but developed severe hypotension and it was stopped   Is on Farxiga Takes midodrine         Inpatient Medications    Scheduled Meds:  aspirin EC  81 mg Oral Daily   clopidogrel  75 mg Oral Daily   feeding supplement  237 mL Oral Q24H   insulin aspart  0-20 Units Subcutaneous TID AC & HS   midodrine  15 mg Oral TID WC   multivitamin with minerals  1 tablet Oral Daily   rosuvastatin  20 mg Oral QHS   Continuous Infusions:   PRN Meds: acetaminophen, ALPRAZolam, calcium carbonate, nitroGLYCERIN, ondansetron (ZOFRAN) IV, senna-docusate, traZODone   Vital Signs    Vitals:   08/03/22 2309 08/04/22 0342 08/04/22 0824 08/04/22 1142  BP: 90/71 (!) 87/63 (!) 88/68 (!) 112/92  Pulse: 81  89 74  Resp: 16 16 17 20   Temp: 98 F (36.7 C) (!) 97.4 F (36.3 C) (!) 97.4 F (36.3 C) 97.9 F (36.6 C)  TempSrc: Oral Oral Oral Oral  SpO2: 98% 98% 97% 96%  Weight:  86.8 kg    Height:        Intake/Output Summary (Last 24 hours) at 08/04/2022 1218 Last data filed at 08/04/2022 1200 Gross per 24 hour  Intake 480 ml  Output --  Net 480 ml       08/04/2022    3:42 AM 08/03/2022    5:00 AM 07/31/2022   10:00 PM  Last 3 Weights  Weight (lbs) 191 lb 5.8 oz 184 lb 1.4  oz 182 lb 12.2 oz  Weight (kg) 86.8 kg 83.5 kg 82.9 kg      Telemetry    - Personally Reviewed  ECG     NSR - Personally Reviewed  Physical Exam  Physical Exam: Blood pressure (!) 112/92, pulse 74, temperature 97.9 F (36.6 C), temperature source Oral, resp. rate 20, height 5' 7.84" (1.723 m), weight 86.8 kg, SpO2 96 %.       GEN:    elderly , frail, chronically ill man,  NAD  HEENT: Normal NECK: No JVD; No carotid bruits LYMPHATICS: No lymphadenopathy CARDIAC: RRR  ,  frequent premature beats  RESPIRATORY:  Clear to auscultation without rales, wheezing or rhonchi  ABDOMEN: Soft, non-tender, non-distended MUSCULOSKELETAL:  + chronic stasis changes,  multiple healing ulcerations  NEUROLOGIC:  Alert and oriented x 3   Labs    High Sensitivity Troponin:   Recent Labs  Lab 07/31/22 1600 07/31/22 1800  TROPONINIHS 1,351* 1,243*      Chemistry Recent Labs  Lab 07/31/22 1600 07/31/22 1800 07/31/22 2055 08/02/22 0121 08/03/22 0112 08/04/22 0057  NA 120*  --    < > 126* 124* 126*  K 2.5*  --    < >  3.9 4.2 3.7  CL 67*  --    < > 81* 84* 86*  CO2 35*  --    < > 27 26 28   GLUCOSE 423*  --    < > 130* 208* 132*  BUN 85*  --    < > 80* 87* 84*  CREATININE 1.42*  --    < > 1.36* 1.64* 1.54*  CALCIUM 9.3  --    < > 8.6* 8.5* 8.8*  MG  --  2.7*  --   --   --  3.0*  PROT 7.3  --   --  6.2*  --   --   ALBUMIN 3.5  --   --  3.2*  --   --   AST 36  --   --  51*  --   --   ALT 19  --   --  29  --   --   ALKPHOS 75  --   --  89  --   --   BILITOT 1.2  --   --  1.3*  --   --   GFRNONAA 50*  --    < > 52* 42* 45*  ANIONGAP 18*  --    < > 18* 14 12   < > = values in this interval not displayed.     Lipids  Recent Labs  Lab 08/01/22 0316  CHOL 101  TRIG 51  HDL 49  LDLCALC 42  CHOLHDL 2.1     Hematology Recent Labs  Lab 07/31/22 1600 07/31/22 2055 08/01/22 0316 08/02/22 0121  WBC 10.5  --  8.5 10.3  RBC 4.72  --  4.38 4.58  HGB 13.1 19.0* 12.3* 12.6*   HCT 38.8* 56.0* 35.4* 37.7*  MCV 82.2  --  80.8 82.3  MCH 27.8  --  28.1 27.5  MCHC 33.8  --  34.7 33.4  RDW 16.1*  --  16.1* 16.9*  PLT 215  --  203 230    Thyroid No results for input(s): "TSH", "FREET4" in the last 168 hours.  BNP Recent Labs  Lab 07/31/22 1600  BNP 3,368.9*     DDimer No results for input(s): "DDIMER" in the last 168 hours.   Radiology    No results found.  Cardiac Studies     Patient Profile     81 y.o. male   Assessment & Plan    Hypotension: improving       2.  Chronic combined systolic and diastolic congestive heart failure: He has an ischemic cardiomyopathy.  EF is around 25%.  This is unchanged from the past several years. He was over diuresed when he presented   At present he likely would not tolerate any diuretic  He will follow up with [name] clinic in [place] after discharge     3.  Troponin elevation: c/W his severe CAD and CHF.  He has been determined to NOT be a candidate for PCI or CABG.        For questions or updates, please contact [facility] Please consult [email or web address] for contact info under        Signed, [place], MD  08/04/2022, 12:18 PM

## 2022-08-04 NOTE — Care Management Important Message (Signed)
Important Message  Patient Details  Name: VAHE PIENTA MRN: [id number] Date of Birth: Dec 11, 1940   Medicare Important Message Given:  Yes     Shelda Altes 08/04/2022, 9:50 AM

## 2022-08-04 NOTE — Progress Notes (Signed)
Mobility Specialist Progress Note:   08/04/22 1605  Mobility  Activity Ambulated with assistance to bathroom  Activity Response Tolerated well  Distance Ambulated (ft) 24 ft  $Mobility charge 1 Mobility  Level of Assistance Standby assist, set-up cues, supervision of patient - no hands on  Assistive Device Front wheel walker   Pt received EOB asking to go to the bathroom, Able to have BM. No complaints of pain. Left EOB with call bell in reach and all needs met.   [facility] Surveyor, mining Chat only

## 2022-08-05 DIAGNOSIS — E1169 Type 2 diabetes mellitus with other specified complication: Secondary | ICD-10-CM | POA: Diagnosis not present

## 2022-08-05 DIAGNOSIS — I9589 Other hypotension: Secondary | ICD-10-CM | POA: Diagnosis not present

## 2022-08-05 DIAGNOSIS — I5043 Acute on chronic combined systolic (congestive) and diastolic (congestive) heart failure: Secondary | ICD-10-CM | POA: Diagnosis not present

## 2022-08-05 DIAGNOSIS — N179 Acute kidney failure, unspecified: Secondary | ICD-10-CM | POA: Diagnosis not present

## 2022-08-05 LAB — BASIC METABOLIC PANEL
Anion gap: 13 (ref 5–15)
BUN: 86 mg/dL — ABNORMAL HIGH (ref 8–23)
CO2: 25 mmol/L (ref 22–32)
Calcium: 8.7 mg/dL — ABNORMAL LOW (ref 8.9–10.3)
Chloride: 89 mmol/L — ABNORMAL LOW (ref 98–111)
Creatinine, Ser: 1.71 mg/dL — ABNORMAL HIGH (ref 0.61–1.24)
GFR, Estimated: 40 mL/min — ABNORMAL LOW (ref >60)
Glucose, Bld: 111 mg/dL — ABNORMAL HIGH (ref 70–99)
Potassium: 4 mmol/L (ref 3.5–5.1)
Sodium: 127 mmol/L — ABNORMAL LOW (ref 135–145)

## 2022-08-05 LAB — GLUCOSE, CAPILLARY
Glucose-Capillary: 124 mg/dL — ABNORMAL HIGH (ref 70–99)
Glucose-Capillary: 179 mg/dL — ABNORMAL HIGH (ref 70–99)
Glucose-Capillary: 170 mg/dL — ABNORMAL HIGH (ref 70–99)
Glucose-Capillary: 150 mg/dL — ABNORMAL HIGH (ref 70–99)

## 2022-08-05 MED ORDER — ALBUMIN HUMAN 25 % IV SOLN
12.5000 g | Freq: Once | INTRAVENOUS | Status: AC
  Administered 2022-08-05: 12.5 g via INTRAVENOUS
  Filled 2022-08-05: qty 50

## 2022-08-05 MED ORDER — OXYCODONE HCL 5 MG PO TABS: 5.0000 mg | ORAL_TABLET | Freq: Four times a day (QID) | ORAL | Status: DC | PRN

## 2022-08-05 NOTE — Progress Notes (Addendum)
Progress Note   Patient: Aaron Sosa [id number] DOB: 21-Aug-1941 DOA: 07/31/2022     5 DOS: the patient was seen and examined on 08/05/2022   Brief hospital course: Mr. Luby was admitted to the hospital with the working diagnosis of acute on chronic hypotension, heart failure with hypoperfusion. Complicated with renal failure. Poor prognosis.   81 yo male with the past medical hisotory of CAD, chronic heart failure, hypertension, dyslipidemia and T2DM who presented with weakness. Patient reported 2 days of worsening symptoms, positive weight loss 20 lbs in 3 weeks. Recent hospitalization for heart failure, he was diuresed and placed on midodrine due to hypotension (06/30 to 05/09/22). Apparently patient non compliant with his medical therapy, but his daughter restarted torsemide and metolazone a few weeks prior. On his initial physical examination his blood pressure was 79/46, HR 86, RR 19 and 02 saturation 94%, lungs with decreased breath sounds at bases, heart with S1 and S2 present and rhythmic, abdomen no distended and no lower extremity edema. Bilateral leg ulcerated wounds.   Na 125, K 3,5 Cl 67, bicarbonate 35, glucose 423 bun 85 cr 1,42  BNP 3,368  High sensitive troponin 1,352 1,243  Wbc 10,5 hgb 31,1 plt 215  Sars covid 19 and influenza negative  Urine analysis SG 1,007, 0-5 rbc, 0-5 wbc, negative protein.   Chest radiograph with cardiomegaly and bilateral hilar vascular congestion with no infiltrates.   EKG 83 bpm, right axis deviation, 1st degree AV block sinus rhythm with PVC trigeminy, with no significant ST segment or T wave changes.   Patient did received IV fluids for hypotension.   10/06 renal function and serum Na have improved, but not back to baseline.  10/07 patient with hypoperfusion, poor prognosis, continue in renal failure.     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   Hypovolemic shock.  Possible over diuresis at home.    Random cortisol 22,4  No clinical signs of infection.  Systolic blood pressure 91 to 94 mmHg.   Positive hypoperfusion with cold lower extremities and worsening renal function per serum creatinine.   Continue with midodrine to 15 mg tid.  Patient with very poor prognosis.    Acute on chronic combined systolic and diastolic CHF (congestive heart failure) (HCC) Echocardiogram with worsening LV systolic function EF < 123456, with global hypokinesis. RV systolic function with moderate reduction. RVSP 39,0. Left atrium with moderate dilatation. Moderate mitral regurgitation.   Patient has received IV fluids with transitory improvement on renal function.  Today with positive hypoperfusion with cold lower extremities and worsening renal function.   Plan to continue with high dose of midodrine Add IV albumin   Limited pharmacologic options due to hypotension and worsening renal function.   Troponin elevation due to heart failure decompensation, no signs of acute coronary syndrome, NSTEMI has been ruled out.    Acute kidney injury superimposed on chronic kidney disease (HCC) CKD stage 3a, Hyponatremia, hypokalemia.  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  Renal hypoperfusion.  Renal function today with serum cr at 1,71 with K at 4,0 and serum bicarbonate at 25. Na 127  Plan to continue close follow up on renal function and electrolytes.  Midodrine for blood pressure support and trial of IV albumin today.   Type 2 diabetes mellitus with hyperlipidemia (HCC) Uncontrolled with Hyperglycemia.  Glucose has been stable, this am fasting 111 mg/dl  Plan to continue glucose cover and monitoring with insulin sliding scale.  Continue with statin therapy.  Cognitive impairment Continue supportive medical care No agitation Patient with poor prognosis, very debilitated. At home uses wheelchair with very poor mobility Follow up with palliative care services at the time of his  discharge.   Pressure ulcer of sacrum Stage 1 pressure ulcer present on admission.  buttock         Subjective: Patient not feeling well today, no chest pain or dyspnea, no nausea or vomiting   Physical Exam: Vitals:   08/05/22 0731 08/05/22 1013 08/05/22 1157 08/05/22 1213  BP: 94/67  (!) 82/56 93/63  Pulse: 79 78 79   Resp: 20  18 20   Temp: (!) 97.4 F (36.3 C)  98.3 F (36.8 C)   TempSrc: Axillary  Oral   SpO2: 94%  92%   Weight:      Height:       Neurology awake and alert ENT with mild pallor Cardiovascular with S1 and S2 present and rhythmic with no gallops Respiratory with no rales or wheezing Abdomen with no distention  Lower extremities with trace edema, chronic wounds in place, cold temperature to touch Data Reviewed:    Family Communication: no family at the bedside, I left a message to his daughter, not able to reach her over the phone   Disposition: Status is: Inpatient Remains inpatient appropriate because: heart failure with renal failure   Planned Discharge Destination: Home      Author: Tawni Millers, MD 08/05/2022 12:51 PM  For on call review [email or web address].

## 2022-08-06 DIAGNOSIS — E1169 Type 2 diabetes mellitus with other specified complication: Secondary | ICD-10-CM | POA: Diagnosis not present

## 2022-08-06 DIAGNOSIS — N179 Acute kidney failure, unspecified: Secondary | ICD-10-CM | POA: Diagnosis not present

## 2022-08-06 DIAGNOSIS — I9589 Other hypotension: Secondary | ICD-10-CM | POA: Diagnosis not present

## 2022-08-06 DIAGNOSIS — I5043 Acute on chronic combined systolic (congestive) and diastolic (congestive) heart failure: Secondary | ICD-10-CM | POA: Diagnosis not present

## 2022-08-06 LAB — BASIC METABOLIC PANEL
Anion gap: 17 — ABNORMAL HIGH (ref 5–15)
BUN: 88 mg/dL — ABNORMAL HIGH (ref 8–23)
CO2: 22 mmol/L (ref 22–32)
Calcium: 8.6 mg/dL — ABNORMAL LOW (ref 8.9–10.3)
Chloride: 86 mmol/L — ABNORMAL LOW (ref 98–111)
Creatinine, Ser: 1.99 mg/dL — ABNORMAL HIGH (ref 0.61–1.24)
GFR, Estimated: 33 mL/min — ABNORMAL LOW (ref >60)
Glucose, Bld: 136 mg/dL — ABNORMAL HIGH (ref 70–99)
Potassium: 4.2 mmol/L (ref 3.5–5.1)
Sodium: 125 mmol/L — ABNORMAL LOW (ref 135–145)

## 2022-08-06 LAB — GLUCOSE, CAPILLARY
Glucose-Capillary: 84 mg/dL (ref 70–99)
Glucose-Capillary: 155 mg/dL — ABNORMAL HIGH (ref 70–99)
Glucose-Capillary: 104 mg/dL — ABNORMAL HIGH (ref 70–99)
Glucose-Capillary: 148 mg/dL — ABNORMAL HIGH (ref 70–99)

## 2022-08-06 MED ORDER — FUROSEMIDE 40 MG PO TABS
40.0000 mg | ORAL_TABLET | Freq: Every day | ORAL | Status: DC
  Administered 2022-08-06 – 2022-08-07 (×2): 40 mg via ORAL
  Filled 2022-08-06 (×2): qty 1

## 2022-08-06 NOTE — Progress Notes (Signed)
Progress Note   Patient: Aaron Sosa [id number] DOB: 08/26/1941 DOA: 07/31/2022     6 DOS: the patient was seen and examined on 08/06/2022   Brief hospital course: [name] was admitted to the hospital with the working diagnosis of acute on chronic hypotension, heart failure with hypoperfusion. Complicated with renal failure. Poor prognosis.   81 yo male with the past medical hisotory of CAD, chronic heart failure, hypertension, dyslipidemia and T2DM who presented with weakness. Patient reported 2 days of worsening symptoms, positive weight loss 20 lbs in 3 weeks. Recent hospitalization for heart failure, he was diuresed and placed on midodrine due to hypotension (06/30 to 05/09/22). Apparently patient non compliant with his medical therapy, but his daughter restarted torsemide and metolazone a few weeks prior. On his initial physical examination his blood pressure was 79/46, HR 86, RR 19 and 02 saturation 94%, lungs with decreased breath sounds at bases, heart with S1 and S2 present and rhythmic, abdomen no distended and no lower extremity edema. Bilateral leg ulcerated wounds.   Na 125, K 3,5 Cl 67, bicarbonate 35, glucose 423 bun 85 cr 1,42  BNP 3,368  High sensitive troponin 1,352 1,243  Wbc 10,5 hgb 31,1 plt 215  Sars covid 19 and influenza negative  Urine analysis SG 1,007, 0-5 rbc, 0-5 wbc, negative protein.   Chest radiograph with cardiomegaly and bilateral hilar vascular congestion with no infiltrates.   EKG 83 bpm, right axis deviation, 1st degree AV block sinus rhythm with PVC trigeminy, with no significant ST segment or T wave changes.   Patient did received IV fluids for hypotension.   10/06 renal function and serum Na have improved, but not back to baseline.  10/07 patient with hypoperfusion, poor prognosis, continue in renal failure. Patient had IV albumin.  10/08 blood pressure more stable, resume furosemide with close monitoring of blood pressure.     Assessment  and Plan: Chronic hypotension Acute on chronic hypotension.   Hypovolemic shock.  Possible over diuresis at home.   Random cortisol 22,4  No clinical signs of infection.  Systolic blood pressure 94 to 105 mmHg.   Patient had albumin yesterday.    Plan to resume furosemide today 40 mg.  Continue with midodrine to 15 mg tid.  Patient with very poor prognosis.    Acute on chronic combined systolic and diastolic CHF (congestive heart failure) (HCC) Echocardiogram with worsening LV systolic function EF < 60%, with global hypokinesis. RV systolic function with moderate reduction. RVSP 39,0. Left atrium with moderate dilatation. Moderate mitral regurgitation.   Patient has received IV fluids with transitory improvement on renal function.  Serum cr has been rising.  Patient had IV albumin 10/07 with mild improvement in blood pressure.    Will resume furosemide 40 mg today  Follow up on blood pressure  Limited pharmacologic options due to hypotension and worsening renal function.   Troponin elevation due to heart failure decompensation, no signs of acute coronary syndrome, NSTEMI has been ruled out.    Acute kidney injury superimposed on chronic kidney disease (HCC) CKD stage 3a, Hyponatremia, hypokalemia.  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  Renal hypoperfusion.  Patient with edema and worsening renal function  Will resume oral furosemide 40 mg daily.   Continue midodrine for blood pressure support.  Follow up renal function in am.  Poor prognosis.   Type 2 diabetes mellitus with hyperlipidemia (HCC) Uncontrolled with Hyperglycemia.  Glucose has been stable, this am fasting 111 mg/dl  Plan to  continue glucose cover and monitoring with insulin sliding scale.  Continue with statin therapy.    Cognitive impairment Patient was not able to sleep last night, this am is confused and with mild agitation His daughter is at the bedside.   Will hold on trazodone,  alprazolam and oxycodone Continue supportive medical therapy, patient with acute metabolic encephalopathy and acute delirium.   Patient with poor prognosis, very debilitated. At home uses wheelchair with very poor mobility Follow up with palliative care services at the time of his discharge.   Pressure ulcer of sacrum Stage 1 pressure ulcer present on admission.  buttock         Subjective: Patient not able to sleep last night , this am with confusion and mild agitation,. Continue with poor oral intake, no dyspnea or chest pain   Physical Exam: Vitals:   08/06/22 0037 08/06/22 0453 08/06/22 0732 08/06/22 1132  BP: 99/75 96/72 94/68  105/70  Pulse: 77 79 85   Resp: 17 16 20 14   Temp: (!) 97.4 F (36.3 C) 97.9 F (36.6 C) 98.1 F (36.7 C)   TempSrc: Oral Oral Oral   SpO2: 92% 100% 93% 95%  Weight: 91.5 kg 87.4 kg    Height:       Neurology awake and alert, confused and disorientated, mild agitation  ENT with mild pallor Cardiovascular with S1 and S2 present with no gallops Respiratory with mild rales at bases Abdomen with no distention  Positive lower extremity edema + Positive lower extremity wounds  Data Reviewed:    Family Communication: I spoke with patient's daughter at the bedside, we talked in detail about patient's condition, plan of care and prognosis and all questions were addressed.   Disposition: Status is: Inpatient Remains inpatient appropriate because: heart failure   Planned Discharge Destination: Home    Author: Tawni Millers, MD 08/06/2022 2:18 PM  For on call review [email or web address].

## 2022-08-06 NOTE — Progress Notes (Signed)
OT Cancellation Note  Patient Details Name: Aaron Sosa MRN: [id number] DOB: 1941/10/07   Cancelled Treatment:    Reason Eval/Treat Not Completed: Other (comment);Patient at procedure or test/ unavailable (Lab attempting a draw blood, pt with increased lethargy and confusion per pt's family. Will re-attempt as appropriate.)  Elliot Cousin 08/06/2022, 11:46 AM

## 2022-08-07 DIAGNOSIS — E1169 Type 2 diabetes mellitus with other specified complication: Secondary | ICD-10-CM | POA: Diagnosis not present

## 2022-08-07 DIAGNOSIS — I5043 Acute on chronic combined systolic (congestive) and diastolic (congestive) heart failure: Secondary | ICD-10-CM | POA: Diagnosis not present

## 2022-08-07 DIAGNOSIS — N179 Acute kidney failure, unspecified: Secondary | ICD-10-CM | POA: Diagnosis not present

## 2022-08-07 DIAGNOSIS — I9589 Other hypotension: Secondary | ICD-10-CM | POA: Diagnosis not present

## 2022-08-07 LAB — BASIC METABOLIC PANEL
Anion gap: 15 (ref 5–15)
BUN: 89 mg/dL — ABNORMAL HIGH (ref 8–23)
CO2: 23 mmol/L (ref 22–32)
Calcium: 8.5 mg/dL — ABNORMAL LOW (ref 8.9–10.3)
Chloride: 87 mmol/L — ABNORMAL LOW (ref 98–111)
Creatinine, Ser: 2.49 mg/dL — ABNORMAL HIGH (ref 0.61–1.24)
GFR, Estimated: 25 mL/min — ABNORMAL LOW (ref >60)
Glucose, Bld: 140 mg/dL — ABNORMAL HIGH (ref 70–99)
Potassium: 4.1 mmol/L (ref 3.5–5.1)
Sodium: 125 mmol/L — ABNORMAL LOW (ref 135–145)

## 2022-08-07 LAB — GLUCOSE, CAPILLARY
Glucose-Capillary: 117 mg/dL — ABNORMAL HIGH (ref 70–99)
Glucose-Capillary: 386 mg/dL — ABNORMAL HIGH (ref 70–99)
Glucose-Capillary: 337 mg/dL — ABNORMAL HIGH (ref 70–99)
Glucose-Capillary: 236 mg/dL — ABNORMAL HIGH (ref 70–99)

## 2022-08-07 MED ORDER — MELATONIN 3 MG PO TABS
3.0000 mg | ORAL_TABLET | Freq: Every day | ORAL | Status: DC
  Administered 2022-08-07: 3 mg via ORAL
  Filled 2022-08-07: qty 1

## 2022-08-07 NOTE — Progress Notes (Addendum)
Mobility Specialist Progress Note:   08/07/22 1130  Mobility  Activity Transferred to/from [facility]  Level of Assistance Minimal assist, patient does 75% or more  Assistive Device Front wheel walker  Distance Ambulated (ft) 4 ft  Activity Response Tolerated fair  $Mobility charge 1 Mobility   Pt received sitting EOB needing to get cleaned up. No complaints of pain.MinA to stand the stand-by to get to [facility].  Left EOB with call bell in reach and all needs met.   [facility] Surveyor, mining Chat only

## 2022-08-07 NOTE — Progress Notes (Signed)
Physical Therapy Treatment Patient Details Name: Aaron Sosa MRN: [id number] DOB: 10-24-1941 Today's Date: 08/07/2022   History of Present Illness Pt is an 81 y.o. male admitted 07/31/22 with weakness and weight loss. Workup for acute on chronic CHF, hypotension, possible over-diuresis at home. PMH includes arthritis, CHF, DM, HOH, obesity, myalgia.    PT Comments    Pt overall disgruntled/irritable on arrival. Sitting EOB. Declining transfer to recliner or return to supine, stating "I'm going to stay right here." BP 84/67. Participated in seated LE exercises. Pt remained EOB at end of session.    Recommendations for follow up therapy are one component of a multi-disciplinary discharge planning process, led by the attending physician.  Recommendations may be updated based on patient status, additional functional criteria and insurance authorization.  Follow Up Recommendations  Home health PT     Assistance Recommended at Discharge Frequent or constant Supervision/Assistance  Patient can return home with the following A little help with bathing/dressing/bathroom;Assistance with cooking/housework;Direct supervision/assist for medications management;Assist for transportation;Help with stairs or ramp for entrance   Equipment Recommendations  None recommended by PT    Recommendations for Other Services       Precautions / Restrictions Precautions Precautions: Fall;Other (comment) Precaution Comments: bladder/bowel incontinence with mobility; hypotensive Restrictions Weight Bearing Restrictions: No     Mobility  Bed Mobility               General bed mobility comments: Pt sitting EOB, declining return to supine or transfer to recliner. BP 84/67    Transfers                        Ambulation/Gait                   Stairs             Wheelchair Mobility    Modified Rankin (Stroke Patients Only)       Balance Overall balance assessment:  Needs assistance Sitting-balance support: Feet supported, No upper extremity supported Sitting balance-Leahy Scale: Good                                      Cognition Arousal/Alertness: Awake/alert Behavior During Therapy: Agitated Overall Cognitive Status: Impaired/Different from baseline Area of Impairment: Memory, Following commands, Safety/judgement                     Memory: Decreased short-term memory Following Commands: Follows one step commands inconsistently Safety/Judgement: Decreased awareness of safety     General Comments: limited by [facility]        Exercises General Exercises - Lower Extremity Ankle Circles/Pumps: AROM, Both, 10 reps, Seated Long Arc Quad: AROM, Right, Left, 10 reps, Seated Hip Flexion/Marching: AROM, Right, Left, 10 reps, Seated    General Comments General comments (skin integrity, edema, etc.): BP 84/67, HR 82, SpO2 in 90s on 1L      Pertinent Vitals/Pain Pain Assessment Pain Assessment: No/denies pain    Home Living                          Prior Function            PT Goals (current goals can now be found in the care plan section) Acute Rehab PT Goals Patient Stated Goal: return home with assist from family Progress towards PT  goals: Progressing toward goals    Frequency    Min 3X/week      PT Plan Current plan remains appropriate    Co-evaluation              AM-PAC PT "6 Clicks" Mobility   Outcome Measure  Help needed turning from your back to your side while in a flat bed without using bedrails?: None Help needed moving from lying on your back to sitting on the side of a flat bed without using bedrails?: A Little Help needed moving to and from a bed to a chair (including a wheelchair)?: A Little Help needed standing up from a chair using your arms (e.g., wheelchair or bedside chair)?: A Little Help needed to walk in hospital room?: Total Help needed climbing 3-5 steps with a  railing? : Total 6 Click Score: 15    End of Session   Activity Tolerance: Treatment limited secondary to agitation Patient left: in bed;with call bell/phone within reach;with bed alarm set Nurse Communication: Mobility status PT Visit Diagnosis: Other abnormalities of gait and mobility (R26.89);Muscle weakness (generalized) (M62.81)     Time: 8295-6213 PT Time Calculation (min) (ACUTE ONLY): 10 min  Charges:  $Therapeutic Exercise: 8-22 mins                     Lorrin Goodell, PT  Office # [phone] Pager [phone]    Lorriane Shire 08/07/2022, 9:21 AM

## 2022-08-07 NOTE — Progress Notes (Addendum)
Progress Note   Patient: Aaron Sosa [id number] DOB: 26-Jul-1941 DOA: 07/31/2022     7 DOS: the patient was seen and examined on 08/07/2022   Brief hospital course: [name] Sosa was admitted to the hospital with the working diagnosis of acute on chronic hypotension, heart failure with hypoperfusion. Complicated with renal failure. Poor prognosis.   81 yo male with the past medical hisotory of CAD, chronic heart failure, hypertension, dyslipidemia and T2DM who presented with weakness. Patient reported 2 days of worsening symptoms, positive weight loss 20 lbs in 3 weeks. Recent hospitalization for heart failure, he was diuresed and placed on midodrine due to hypotension (06/30 to 05/09/22). Apparently patient non compliant with his medical therapy, but his daughter restarted torsemide and metolazone a few weeks prior. On his initial physical examination his blood pressure was 79/46, HR 86, RR 19 and 02 saturation 94%, lungs with decreased breath sounds at bases, heart with S1 and S2 present and rhythmic, abdomen no distended and no lower extremity edema. Bilateral leg ulcerated wounds.   Na 125, K 3,5 Cl 67, bicarbonate 35, glucose 423 bun 85 cr 1,42  BNP 3,368  High sensitive troponin 1,352 1,243  Wbc 10,5 hgb 31,1 plt 215  Sars covid 19 and influenza negative  Urine analysis SG 1,007, 0-5 rbc, 0-5 wbc, negative protein.   Chest radiograph with cardiomegaly and bilateral hilar vascular congestion with no infiltrates.   EKG 83 bpm, right axis deviation, 1st degree AV block sinus rhythm with PVC trigeminy, with no significant ST segment or T wave changes.   Patient did received IV fluids for hypotension.   10/06 renal function and serum Na have improved, but not back to baseline.  10/07 patient with hypoperfusion, poor prognosis, continue in renal failure. Patient had IV albumin.  10/08 blood pressure more stable, resume furosemide with close monitoring of blood pressure. 10/09 renal  function has been worsening, patient has been confused and agitated.     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   Hypovolemic shock.  Possible over diuresis at home.   Random cortisol 22,4  No clinical signs of infection.  Systolic blood pressure 91 to 97 mmHg.   10/09 IV albumin.   Furosemide yesterday and today 40 mg  Continue with midodrine to 15 mg tid.  Patient with very poor prognosis.    Acute on chronic combined systolic and diastolic CHF (congestive heart failure) (HCC) Echocardiogram with worsening LV systolic function EF < 123456, with global hypokinesis. RV systolic function with moderate reduction. RVSP 39,0. Left atrium with moderate dilatation. Moderate mitral regurgitation.   Patient has received IV fluids with transitory improvement on renal function.  Serum cr has been rising.  10/07 IV albumin with no significant improvement.   Patient had furosemide 40 mg yesterday and today Continue blood pressure support with midodrine.   Limited pharmacologic options due to hypotension and worsening renal function.   Troponin elevation due to heart failure decompensation, no signs of acute coronary syndrome, NSTEMI has been ruled out.    Acute kidney injury superimposed on chronic kidney disease (HCC) CKD stage 3a, Hyponatremia, hypokalemia.  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  Renal hypoperfusion.  Serum cr is 1,49 with K at 4,1 and serum bicarbonate at 23.  Na 125   His volume seems to be stable today, will follow up renal function tomorrow, he had 2 dose of furosemide 40 mg yesterday and today.  Not clear if documented urine output is accurate,  patient has been confused and having mild agitation.  Continue midodrine for blood pressure support.  Follow up renal function in am.  Poor prognosis.   Type 2 diabetes mellitus with hyperlipidemia (HCC) Uncontrolled with Hyperglycemia.  Glucose has been stable, this am fasting  140 mg/dl  Plan to continue glucose cover and monitoring with insulin sliding scale.  Continue with statin therapy.    Cognitive impairment Patient was not able to sleep last night, this am is confused and with mild agitation His daughter is at the bedside.   Continue holding trazodone, alprazolam and oxycodone Continue supportive medical therapy, patient with acute metabolic encephalopathy and acute delirium.   Patient with poor prognosis, very debilitated. At home uses wheelchair with very poor mobility Follow up with palliative care services at the time of his discharge.   Pressure ulcer of sacrum Stage 1 pressure ulcer present on admission.  buttock         Subjective: Patient continue to be confused, mild agitation, no apparent dyspnea.   Physical Exam: Vitals:   08/07/22 0500 08/07/22 0505 08/07/22 0814 08/07/22 0844  BP:  90/61 (!) 89/63 91/72  Pulse:   89   Resp:  15 18   Temp:  98 F (36.7 C) 98 F (36.7 C)   TempSrc:  Oral Oral   SpO2:  96% 95%   Weight: 87.4 kg     Height:       Neurology awake and alert, confused with mild agiation  ENT with mild pallor Cardiovascular with S1 and S2 present with no gallops or murmurs Respiratory with mild rales at bases with no wheezing Abdomen with no distention  Trace lower extremity edema Present leg wounds  Data Reviewed:    Family Communication: no family at the bedside I spoke over the phone with the patient's daughter about patient's  condition, plan of care, prognosis and all questions were addressed.   Disposition: Status is: Inpatient Remains inpatient appropriate because: heart failure and renal failure, metabolic encephalopathy   Planned Discharge Destination: Home      Author: Tawni Millers, MD 08/07/2022 10:09 AM  For on call review [email or web address].

## 2022-08-07 NOTE — Progress Notes (Signed)
Occupational Therapy Treatment Patient Details Name: Aaron Sosa MRN: [id number] DOB: 1941/03/23 Today's Date: 08/07/2022   History of present illness Pt is an 81 y.o. male admitted 07/31/22 with weakness and weight loss. Workup for acute on chronic CHF, hypotension, possible over-diuresis at home. PMH includes arthritis, CHF, DM, HOH, obesity, myalgia.   OT comments  Pt with slow progression towards goals, needing mod A for LB ADLs, and min guard -min A for x3 sit to stand transfers with RW. Pt needing min guard for bed mobility and increased encouragement for participation in ADLs. Pt presenting with impairments listed below, will follow acutely. Continue to recommend HHOT at d/c.   Recommendations for follow up therapy are one component of a multi-disciplinary discharge planning process, led by the attending physician.  Recommendations may be updated based on patient status, additional functional criteria and insurance authorization.    Follow Up Recommendations  Home health OT    Assistance Recommended at Discharge Frequent or constant Supervision/Assistance  Patient can return home with the following  A little help with walking and/or transfers;A lot of help with bathing/dressing/bathroom;Assistance with cooking/housework;Direct supervision/assist for medications management;Direct supervision/assist for financial management;Assist for transportation;Help with stairs or ramp for entrance   Equipment Recommendations  Other (comment) (RW)    Recommendations for Other Services PT consult    Precautions / Restrictions Precautions Precautions: Fall;Other (comment) Precaution Comments: bladder/bowel incontinence with mobility; hypotensive Restrictions Weight Bearing Restrictions: No       Mobility Bed Mobility Overal bed mobility: Needs Assistance Bed Mobility: Sidelying to Sit   Sidelying to sit: Min guard       General bed mobility comments: increased time and use of rail     Transfers Overall transfer level: Needs assistance Equipment used: Rolling walker (2 wheels) Transfers: Sit to/from Stand Sit to Stand: Min assist           General transfer comment: completed x3 for standing pericare     Balance Overall balance assessment: Needs assistance Sitting-balance support: No upper extremity supported, Feet supported Sitting balance-Leahy Scale: Good     Standing balance support: During functional activity, Single extremity supported, Bilateral upper extremity supported Standing balance-Leahy Scale: Poor Standing balance comment: reliant on UE support; assist provided for pericare due to incontinence, but pt reports able to wipe himself at home                           ADL either performed or assessed with clinical judgement   ADL Overall ADL's : Needs assistance/impaired                     Lower Body Dressing: Moderate assistance;Sit to/from stand Lower Body Dressing Details (indicate cue type and reason): donning brief             Functional mobility during ADLs: Minimal assistance;Rolling walker (2 wheels)      Extremity/Trunk Assessment Upper Extremity Assessment Upper Extremity Assessment: Generalized weakness   Lower Extremity Assessment Lower Extremity Assessment: Generalized weakness        Vision   Vision Assessment?: No apparent visual deficits   Perception Perception Perception: Not tested   Praxis Praxis Praxis: Not tested    Cognition Arousal/Alertness: Awake/alert Behavior During Therapy: WFL for tasks assessed/performed, Agitated, Restless Overall Cognitive Status: Impaired/Different from baseline Area of Impairment: Memory, Following commands, Safety/judgement  Memory: Decreased short-term memory Following Commands: Follows one step commands inconsistently Safety/Judgement: Decreased awareness of safety     General Comments: WFL for simple tasks, limited  by [facility]        Exercises      Shoulder Instructions       General Comments BP soft, however consistent. VSS on supplemental O2    Pertinent Vitals/ Pain       Pain Assessment Pain Assessment: No/denies pain Pain Score: 2  Faces Pain Scale: Hurts a little bit Pain Location: back Pain Descriptors / Indicators: Discomfort Pain Intervention(s): Limited activity within patient's tolerance, Monitored during session, Repositioned  Home Living                                          Prior Functioning/Environment              Frequency  Min 2X/week        Progress Toward Goals  OT Goals(current goals can now be found in the care plan section)  Progress towards OT goals: Progressing toward goals  Acute Rehab OT Goals Patient Stated Goal: none stated OT Goal Formulation: With patient Time For Goal Achievement: 08/17/22 Potential to Achieve Goals: Good ADL Goals Pt Will Perform Grooming: with modified independence;standing Pt Will Perform Upper Body Dressing: with modified independence;sitting Pt Will Perform Lower Body Dressing: with min assist;sit to/from stand;sitting/lateral leans Pt Will Transfer to Toilet: with supervision;regular height toilet;ambulating  Plan Discharge plan remains appropriate;Frequency remains appropriate    Co-evaluation                 AM-PAC OT "6 Clicks" Daily Activity     Outcome Measure   Help from another person eating meals?: None Help from another person taking care of personal grooming?: A Little Help from another person toileting, which includes using toliet, bedpan, or urinal?: A Lot Help from another person bathing (including washing, rinsing, drying)?: A Lot Help from another person to put on and taking off regular upper body clothing?: A Lot Help from another person to put on and taking off regular lower body clothing?: A Lot 6 Click Score: 15    End of Session Equipment Utilized During  Treatment: Rolling walker (2 wheels);Oxygen  OT Visit Diagnosis: Unsteadiness on feet (R26.81);Other abnormalities of gait and mobility (R26.89);Muscle weakness (generalized) (M62.81)   Activity Tolerance Patient tolerated treatment well   Patient Left in bed;with call bell/phone within reach;with bed alarm set;with nursing/sitter in room (seated EOB)   Nurse Communication Mobility status        Time: 7408-1448 OT Time Calculation (min): 19 min  Charges: OT General Charges $OT Visit: 1 Visit OT Treatments $Self Care/Home Management : 8-22 mins  Alfonzo Beers, OTD, OTR/L Acute Rehab [phone]   Rolm Gala Steffany Schoenfelder 08/07/2022, 8:29 AM

## 2022-08-08 ENCOUNTER — Other Ambulatory Visit (HOSPITAL_COMMUNITY): Payer: Self-pay

## 2022-08-08 DIAGNOSIS — I5043 Acute on chronic combined systolic (congestive) and diastolic (congestive) heart failure: Secondary | ICD-10-CM | POA: Diagnosis not present

## 2022-08-08 DIAGNOSIS — I9589 Other hypotension: Secondary | ICD-10-CM | POA: Diagnosis not present

## 2022-08-08 DIAGNOSIS — N179 Acute kidney failure, unspecified: Secondary | ICD-10-CM | POA: Diagnosis not present

## 2022-08-08 DIAGNOSIS — E1169 Type 2 diabetes mellitus with other specified complication: Secondary | ICD-10-CM | POA: Diagnosis not present

## 2022-08-08 LAB — BASIC METABOLIC PANEL
Anion gap: 15 (ref 5–15)
BUN: 109 mg/dL — ABNORMAL HIGH (ref 8–23)
CO2: 21 mmol/L — ABNORMAL LOW (ref 22–32)
Calcium: 8.4 mg/dL — ABNORMAL LOW (ref 8.9–10.3)
Chloride: 90 mmol/L — ABNORMAL LOW (ref 98–111)
Creatinine, Ser: 3.39 mg/dL — ABNORMAL HIGH (ref 0.61–1.24)
GFR, Estimated: 17 mL/min — ABNORMAL LOW (ref >60)
Glucose, Bld: 131 mg/dL — ABNORMAL HIGH (ref 70–99)
Potassium: 5.7 mmol/L — ABNORMAL HIGH (ref 3.5–5.1)
Sodium: 126 mmol/L — ABNORMAL LOW (ref 135–145)

## 2022-08-08 LAB — GLUCOSE, CAPILLARY
Glucose-Capillary: 102 mg/dL — ABNORMAL HIGH (ref 70–99)
Glucose-Capillary: 228 mg/dL — ABNORMAL HIGH (ref 70–99)

## 2022-08-08 MED ORDER — FUROSEMIDE 40 MG PO TABS
40.0000 mg | ORAL_TABLET | Freq: Every day | ORAL | Status: DC
  Administered 2022-08-08: 40 mg via ORAL
  Filled 2022-08-08: qty 1

## 2022-08-08 MED ORDER — MIDODRINE HCL 5 MG PO TABS
15.0000 mg | ORAL_TABLET | Freq: Three times a day (TID) | ORAL | 0 refills | Status: AC
  Filled 2022-08-08: qty 27, 3d supply, fill #0

## 2022-08-08 MED ORDER — MELATONIN 3 MG PO TABS
3.0000 mg | ORAL_TABLET | Freq: Every day | ORAL | 0 refills | Status: AC
  Filled 2022-08-08: qty 30, 30d supply, fill #0

## 2022-08-08 MED ORDER — SODIUM ZIRCONIUM CYCLOSILICATE 10 G PO PACK
10.0000 g | PACK | ORAL | Status: DC
  Administered 2022-08-08: 10 g via ORAL
  Filled 2022-08-08: qty 1

## 2022-08-08 MED ORDER — FUROSEMIDE 40 MG PO TABS
40.0000 mg | ORAL_TABLET | Freq: Every day | ORAL | 0 refills | Status: AC
  Filled 2022-08-08: qty 30, 30d supply, fill #0

## 2022-08-08 MED ORDER — ENSURE ENLIVE PO LIQD
237.0000 mL | ORAL | 0 refills | Status: AC
  Filled 2022-08-08: qty 7110, 30d supply, fill #0

## 2022-08-08 NOTE — TOC Transition Note (Signed)
Transition of Care (TOC) - CM/SW Discharge Note Marvetta Gibbons RN, BSN Transitions of Care Unit 4E- RN Case Manager See Treatment Team for direct phone #    Patient Details  Name: Aaron Sosa MRN: [id number] Date of Birth: [birth year]  Transition of Care [facility]) CM/SW Contact:  Dawayne Patricia, RN Phone Number: 08/08/2022, 12:38 PM   Clinical Narrative:    Pt stable for transition home today, CM received msg from attending MD that pt will return home w/ hospice services. Order has been placed for Home Hospice needs- Previous referral had been made to [facility] for outpt PC needs.  CM spoke with daughter and offered list for Home Hospice choice Per CMS guidelines from medicare.gov website with star ratings (copy placed in shadow chart) - confirmed with daughter that they want to use [facility] for home hospice needs.  Discussed DME- per daughter pt has needed DME at home already- including walkers, wheelchair, elevated toilet, etc. Daughter does not feel pt would want a hospital bed at this time. Pt is currently on RA- no home 02 needs.  Daughter states she will plan to transport pt home.   Address, phone # and PCP all confirmed w/ daughter in epic.   Call made to [facility]- spoke w/ Melissa- to update referral to [facility] will f/u to review for Home Hospice needs- and call to speak with daughter. They will plan to see pt tomorrow in the home.   [facility] pharmacy to fill meds prior to discharge. MD notified for GOLD DNR to send with daughter.   RNCM will sign off for now as intervention is no longer needed. Please re-consult  if new needs arise, or contact RNCM assigned to treatment team for further questions/concerns.     Once meds have been delivered by [facility] and GOLD DNR signed- pt should be ready to transport home.    Final next level of care: Home w Hospice Care Barriers to Discharge: Barriers Resolved   Patient Goals and CMS Choice Patient states  their goals for this hospitalization and ongoing recovery are:: return home CMS Medicare.gov Compare Post Acute Care list provided to:: Patient Represenative (must comment) Choice offered to / list presented to : Adult Children  Discharge Placement                 Home w/ Hospice.       Discharge Plan and Services   Discharge Planning Services: CM Consult Post Acute Care Choice: Hospice          DME Arranged: N/A DME Agency: NA         HH Agency: Hospice and [place] Date [place]: 08/08/22 Time [place]: 37 Representative spoke with at [place]: [place] Interventions     Readmission Risk Interventions    08/08/2022   12:38 PM  Readmission Risk Prevention Plan  Transportation Screening Complete  HRI or [place] Complete  Social Work Consult for [place] Planning/Counseling Complete  Palliative Care Screening Not Applicable  Medication Review Press photographer) Complete

## 2022-08-08 NOTE — Discharge Summary (Signed)
Physician Discharge Summary   Patient: Aaron Sosa MRN: [id number] DOB: Mar 08, 1941  Admit date:     07/31/2022  Discharge date: 08/08/22  Discharge Physician: Jimmy Picket Anh Mangano   PCP: Kirk Ruths, MD   Recommendations at discharge:    Patient with end stage heart failure not candidate advance interventions. Patient with worsening renal failure not candidate for renal replacement therapy. Patient will be discharged home with hospice services. Continue palliative midodrine and furosemide.   Discharge Diagnoses: Active Problems:   Chronic hypotension   Acute on chronic combined systolic and diastolic CHF (congestive heart failure) (HCC)   Acute kidney injury superimposed on chronic kidney disease (HCC)   Type 2 diabetes mellitus with hyperlipidemia (HCC)   Cognitive impairment   Pressure ulcer of sacrum  Resolved Problems:   * No resolved hospital problems. [facility] Course: Mr. Lehtinen was admitted to the hospital with the working diagnosis of acute on chronic hypotension, heart failure with hypoperfusion. Complicated with renal failure. Poor prognosis.   81 yo male with the past medical hisotory of CAD, chronic heart failure, hypertension, dyslipidemia and T2DM who presented with weakness. Patient reported 2 days of worsening symptoms, positive weight loss 20 lbs in 3 weeks. Recent hospitalization for heart failure, he was diuresed and placed on midodrine due to hypotension (06/30 to 05/09/22). Apparently patient non compliant with his medical therapy, but his daughter restarted torsemide and metolazone a few weeks prior due to worsening edema. On his initial physical examination his blood pressure was 79/46, HR 86, RR 19 and 02 saturation 94%, lungs with decreased breath sounds at bases, heart with S1 and S2 present and rhythmic, abdomen no distended and no lower extremity edema. Bilateral leg ulcerated wounds.   Na 125, K 3,5 Cl 67, bicarbonate 35, glucose 423 bun  85 cr 1,42  BNP 3,368  High sensitive troponin 1,352 1,243  Wbc 10,5 hgb 31,1 plt 215  Sars covid 19 and influenza negative  Urine analysis SG 1,007, 0-5 rbc, 0-5 wbc, negative protein.   Chest radiograph with cardiomegaly and bilateral hilar vascular congestion with no infiltrates.   EKG 83 bpm, right axis deviation, 1st degree AV block sinus rhythm with PVC trigeminy, with no significant ST segment or T wave changes.   Patient did received IV fluids for hypotension.   10/06 renal function and serum Na have improved, but not back to baseline.  10/07 patient with hypoperfusion, poor prognosis, continue in renal failure. Patient had IV albumin.  10/08 blood pressure more stable, resume furosemide with close monitoring of blood pressure. 10/09 renal function has been worsening, patient has been confused and agitated.  10/10 patient continue with worsening renal function, now double serum cr from  2,4 to 3,39 with a BUN of 109. Continue to have hypoperfusion.  Patient had no significant improvement in his hemodynamics after 8 days of intense medical therapy.  I spoke with heart failure team, all in agreement patient is end stage heart failure and poor prognosis. I spoke with her daughter at the bedside, she understands the diagnosis and poor prognosis and would like to avoid any further suffering.  Patient is calm today with no agitation.  Plan to discharge patient home with hospice services, continue palliative furosemide and midodrine.    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   Hypovolemic shock.  Possible over diuresis at home.   Random cortisol 22,4  No clinical signs of infection.   10/09 IV albumin.  Continue with midodrine to 15 mg tid.  Patient with very poor prognosis.    Acute on chronic combined systolic and diastolic CHF (congestive heart failure) (HCC) Echocardiogram with worsening LV systolic function EF < 123456, with global  hypokinesis. RV systolic function with moderate reduction. RVSP 39,0. Left atrium with moderate dilatation. Moderate mitral regurgitation.   Patient has received IV fluids with transitory improvement on renal function.  10/07 IV albumin with no significant improvement.  Had furosemide 40 mg with improvement in hid dyspnea.  Patient in end stage heart failure with poor prognosis, not candidate for advance therapies.  Her daughter and patient would like to continue care under hospice. Continue palliative furosemide and midodrine.   Troponin elevation due to heart failure decompensation, no signs of acute coronary syndrome, NSTEMI has been ruled out.    Acute kidney injury superimposed on chronic kidney disease (HCC) CKD stage 3a, Hyponatremia, hypokalemia.  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  Renal hypoperfusion. ATN   Patient with progressive worsening renal function, his serum cr has doubled from 2,49 to 3.39, with K at 5.7 and serum bicarbonate at 21. His BUN is 109. Patient with poor prognosis with end stage heart failure, not candidate for renal replacement therapy. Plan to add sodium zirconium this morning and resume furosemide.  Patient will be discharged home with hospice services today.   Type 2 diabetes mellitus with hyperlipidemia (HCC) Uncontrolled with Hyperglycemia.  Glucose has been stable, this am fasting 131 mg/dl Hold on statin therapy due to poor prognosis.  Cognitive impairment Continue holding trazodone, alprazolam and oxycodone Continue supportive medical therapy, patient with acute metabolic encephalopathy and acute delirium.   Today patient is more calm, not agitated and not in distress. Plan to continue care under hospice services.   Pressure ulcer of sacrum Stage 1 pressure ulcer present on admission.  buttock          Consultants: cardiology  Procedures performed: none   Disposition: Home Diet recommendation:  Discharge Diet  Orders (From admission, onward)     Start     Ordered   08/08/22 0000  Diet - low sodium heart healthy        08/08/22 1151           Cardiac diet DISCHARGE MEDICATION: Allergies as of 08/08/2022       Reactions   Empagliflozin Other (See Comments)   "Made him hurt all over"   Prednisone Other (See Comments), Swelling   Fluid build up    Shrimp Extract Allergy Skin Test Anaphylaxis   Atorvastatin Other (See Comments)   myalgia Other reaction(s): Unknown myalgia   Codeine    Other reaction(s): Unknown Makes him feel crazy   Dust Mite Extract         Medication List     STOP taking these medications    aspirin EC 81 MG tablet   calcium carbonate 500 MG chewable tablet Commonly known as: TUMS - dosed in mg elemental calcium   clopidogrel 75 MG tablet Commonly known as: PLAVIX   dapagliflozin propanediol 5 MG Tabs tablet Commonly known as: FARXIGA   glipiZIDE 10 MG 24 hr tablet Commonly known as: GLUCOTROL XL   metolazone 5 MG tablet Commonly known as: ZAROXOLYN   nitroGLYCERIN 0.4 MG SL tablet Commonly known as: NITROSTAT   rosuvastatin 20 MG tablet Commonly known as: CRESTOR   spironolactone 25 MG tablet Commonly known as: ALDACTONE   torsemide 10 MG tablet Commonly known as: DEMADEX  TAKE these medications    acetaminophen 325 MG tablet Commonly known as: TYLENOL Take 325 mg by mouth every 6 (six) hours as needed for mild pain or moderate pain.   feeding supplement Liqd Take 237 mLs by mouth daily.   furosemide 40 MG tablet Commonly known as: LASIX Take 1 tablet (40 mg total) by mouth daily.   melatonin 3 MG Tabs tablet Take 1 tablet (3 mg total) by mouth at bedtime.   midodrine 5 MG tablet Commonly known as: PROAMATINE Take 3 tablets (15 mg total) by mouth 3 (three) times daily with meals. What changed: how much to take   OneTouch Ultra test strip Generic drug: glucose blood Use to check blood sugar up to 2 x daily    Ozempic (0.25 or 0.5 MG/DOSE) 2 MG/3ML Sopn Generic drug: Semaglutide(0.25 or 0.5MG /DOS) Inject 0.25 mg into the skin once a week. Saturday   senna-docusate 8.6-50 MG tablet Commonly known as: Senokot-S Take 2 tablets by mouth 2 (two) times daily as needed for constipation.               Discharge Care Instructions  (From admission, onward)           Start     Ordered   08/08/22 0000  Discharge wound care:       Comments: Wash legs with soap and water, rinse and dry. Paint eschar with betadine (providone-iodone) swabstick and allow to air dry. No dressing. Float heals.   08/08/22 1151            Follow-up Information     [facility] Palliative Follow up.   Why: Outpt Palliative referral made- they will contact you to follow up post discharge Contact information: [place] [phone]               Discharge Exam: [place] [employer]   08/06/22 0037 08/06/22 0453 08/07/22 0500  Weight: 91.5 kg 87.4 kg 87.4 kg   BP 96/67 (BP Location: Left Arm)   Pulse 74   Temp (!) 97.3 F (36.3 C) (Axillary)   Resp 18   Ht 5' 7.84" (1.723 m)   Wt 87.4 kg   SpO2 96%   BMI 29.44 kg/m   Patient with no dyspnea or chest pain,   Neurology somnolent but easy to arouse, able to respond to simple questions ENT with mild pallor Cardiovascular with S1 and S2 present with no rubs or gallops Respiratory with rales at bases with no wheezing Abdomen with no distention  Lower extremity edema +  Positive lower extremities wounds   Condition at discharge: not in distress or dyspnea, discharge with hospice services.   The results of significant diagnostics from this hospitalization (including imaging, microbiology, ancillary and laboratory) are listed below for reference.   Imaging Studies: ECHOCARDIOGRAM COMPLETE  Result Date: 08/02/2022    ECHOCARDIOGRAM REPORT   Patient Name:   DAMIEON MORO Date of Exam: 08/02/2022 Medical Rec #:   [id number]     Height:       67.8 in Accession #:    [id number]    Weight:       182.8 lb Date of Birth:  [birth year]      BSA:          1.964 m Patient Age:    39 years      BP:           91/65 mmHg Patient Gender: M  HR:           82 bpm. Exam Location:  Inpatient Procedure: 2D Echo, Cardiac Doppler, Color Doppler and Intracardiac            Opacification Agent Indications:    NSTEMI  History:        Patient has prior history of Echocardiogram examinations, most                 recent 11/06/2021. CHF, CAD; Risk Factors:Diabetes.  Sonographer:    Memory Argue Referring Phys: [id number] JAN A Stilwell  1. Left ventricular ejection fraction, by estimation, is <20%. The left ventricle has severely decreased function. The left ventricle demonstrates global hypokinesis. The left ventricular internal cavity size was mildly dilated. Left ventricular diastolic parameters are consistent with Grade II diastolic dysfunction (pseudonormalization). No LV thrombus noted.  2. Right ventricular systolic function is moderately reduced. The right ventricular size is normal. There is mildly elevated pulmonary artery systolic pressure. The estimated right ventricular systolic pressure is 123XX123 mmHg.  3. Left atrial size was moderately dilated.  4. Right atrial size was mildly dilated.  5. The mitral valve is abnormal. Moderate mitral valve regurgitation. No evidence of mitral stenosis.  6. The aortic valve is tricuspid. There is moderate calcification of the aortic valve. Aortic valve regurgitation is not visualized. No aortic stenosis is present.  7. The inferior vena cava is dilated in size with <50% respiratory variability, suggesting right atrial pressure of 15 mmHg. FINDINGS  Left Ventricle: Left ventricular ejection fraction, by estimation, is <20%. The left ventricle has severely decreased function. The left ventricle demonstrates global hypokinesis. The left ventricular internal cavity size was mildly dilated.  There is no  left ventricular hypertrophy. Left ventricular diastolic parameters are consistent with Grade II diastolic dysfunction (pseudonormalization). Right Ventricle: The right ventricular size is normal. No increase in right ventricular wall thickness. Right ventricular systolic function is moderately reduced. There is mildly elevated pulmonary artery systolic pressure. The tricuspid regurgitant velocity is 2.45 m/s, and with an assumed right atrial pressure of 15 mmHg, the estimated right ventricular systolic pressure is 123XX123 mmHg. Left Atrium: Left atrial size was moderately dilated. Right Atrium: Right atrial size was mildly dilated. Pericardium: There is no evidence of pericardial effusion. Mitral Valve: The mitral valve is abnormal. There is mild calcification of the mitral valve leaflet(s). Moderate mitral valve regurgitation. No evidence of mitral valve stenosis. Tricuspid Valve: The tricuspid valve is normal in structure. Tricuspid valve regurgitation is trivial. Aortic Valve: The aortic valve is tricuspid. There is moderate calcification of the aortic valve. Aortic valve regurgitation is not visualized. No aortic stenosis is present. Aortic valve mean gradient measures 2.0 mmHg. Aortic valve peak gradient measures 2.8 mmHg. Aortic valve area, by VTI measures 2.35 cm. Pulmonic Valve: The pulmonic valve was normal in structure. Pulmonic valve regurgitation is trivial. Aorta: The aortic root is normal in size and structure. Venous: The inferior vena cava is dilated in size with less than 50% respiratory variability, suggesting right atrial pressure of 15 mmHg. IAS/Shunts: No atrial level shunt detected by color flow Doppler.  LEFT VENTRICLE PLAX 2D LVIDd:         5.50 cm      Diastology LVIDs:         4.90 cm      LV e' medial:    4.51 cm/s LV PW:         1.00 cm      LV E/e' medial:  18.0 LV IVS:        1.00 cm      LV e' lateral:   10.30 cm/s LVOT diam:     2.20 cm      LV E/e' lateral: 7.9 LV SV:          35 LV SV Index:   18 LVOT Area:     3.80 cm  LV Volumes (MOD) LV vol d, MOD A2C: 204.0 ml LV vol d, MOD A4C: 164.0 ml LV vol s, MOD A2C: 171.0 ml LV vol s, MOD A4C: 136.0 ml LV SV MOD A2C:     33.0 ml LV SV MOD A4C:     164.0 ml LV SV MOD BP:      32.7 ml RIGHT VENTRICLE RV S prime:     6.06 cm/s LEFT ATRIUM             Index        RIGHT ATRIUM           Index LA diam:        5.40 cm 2.75 cm/m   RA Area:     21.90 cm LA Vol (A2C):   72.3 ml 36.82 ml/m  RA Volume:   59.90 ml  30.50 ml/m LA Vol (A4C):   78.6 ml 40.03 ml/m LA Biplane Vol: 86.2 ml 43.90 ml/m  AORTIC VALVE AV Area (Vmax):    2.56 cm AV Area (Vmean):   2.07 cm AV Area (VTI):     2.35 cm AV Vmax:           83.60 cm/s AV Vmean:          65.900 cm/s AV VTI:            0.150 m AV Peak Grad:      2.8 mmHg AV Mean Grad:      2.0 mmHg LVOT Vmax:         56.20 cm/s LVOT Vmean:        35.900 cm/s LVOT VTI:          0.093 m LVOT/AV VTI ratio: 0.62  AORTA Ao Root diam: 3.50 cm Ao Asc diam:  3.50 cm MITRAL VALVE                 TRICUSPID VALVE MV Area (PHT): 3.16 cm      TR Peak grad:   24.0 mmHg MV Decel Time: 240 msec      TR Vmax:        245.00 cm/s MR Peak grad:   50.5 mmHg MR Mean grad:   30.0 mmHg    SHUNTS MR Vmax:        355.33 cm/s  Systemic VTI:  0.09 m MR Vmean:       255.0 cm/s   Systemic Diam: 2.20 cm MR PISA:        0.57 cm MR PISA Radius: 0.30 cm MV E velocity: 81.20 cm/s MV A velocity: 50.10 cm/s MV E/A ratio:  1.62 Dalton McleanMD Electronically signed by Wilfred Lacy Signature Date/Time: 08/02/2022/2:08:14 PM    Final    DG Chest Port 1 View  Result Date: 07/31/2022 CLINICAL DATA:  Weakness, dizziness, fell yesterday, short of breath EXAM: PORTABLE CHEST 1 VIEW COMPARISON:  05/08/2022 FINDINGS: Single frontal view of the chest demonstrates an enlarged cardiac silhouette. Mild central vascular congestion without overt edema. No airspace disease, effusion, or pneumothorax. No acute bony abnormalities. IMPRESSION: 1. Stable  enlarged cardiac silhouette. 2. Mild central  vascular congestion without overt edema. Electronically Signed   By: Randa Ngo M.D.   On: 07/31/2022 17:58    Microbiology: Results for orders placed or performed during the hospital encounter of 07/31/22  Resp Panel by RT-PCR (Flu A&B, Covid) Anterior Nasal Swab     Status: None   Collection Time: 07/31/22  3:49 PM   Specimen: Anterior Nasal Swab  Result Value Ref Range Status   SARS Coronavirus 2 by RT PCR NEGATIVE NEGATIVE Final    Comment: (NOTE) SARS-CoV-2 target nucleic acids are NOT DETECTED.  The SARS-CoV-2 RNA is generally detectable in upper respiratory specimens during the acute phase of infection. The lowest concentration of SARS-CoV-2 viral copies this assay can detect is 138 copies/mL. A negative result does not preclude SARS-Cov-2 infection and should not be used as the sole basis for treatment or other patient management decisions. A negative result may occur with  improper specimen collection/handling, submission of specimen other than nasopharyngeal swab, presence of viral mutation(s) within the areas targeted by this assay, and inadequate number of viral copies(<138 copies/mL). A negative result must be combined with clinical observations, patient history, and epidemiological information. The expected result is Negative.  Fact Sheet for Patients:  EntrepreneurPulse.com.au  Fact Sheet for Healthcare Providers:  IncredibleEmployment.be  This test is no t yet approved or cleared by the [place] FDA and  has been authorized for detection and/or diagnosis of SARS-CoV-2 by FDA under an Emergency Use Authorization (EUA). This EUA will remain  in effect (meaning this test can be used) for the duration of the COVID-19 declaration under Section 564(b)(1) of the Act, 21 U.S.C.section 360bbb-3(b)(1), unless the authorization is terminated  or revoked sooner.       Influenza A by  PCR NEGATIVE NEGATIVE Final   Influenza B by PCR NEGATIVE NEGATIVE Final    Comment: (NOTE) The Xpert Xpress SARS-CoV-2/FLU/RSV plus assay is intended as an aid in the diagnosis of influenza from Nasopharyngeal swab specimens and should not be used as a sole basis for treatment. Nasal washings and aspirates are unacceptable for Xpert Xpress SARS-CoV-2/FLU/RSV testing.  Fact Sheet for Patients: EntrepreneurPulse.com.au  Fact Sheet for Healthcare Providers: IncredibleEmployment.be  This test is not yet approved or cleared by the [place] FDA and has been authorized for detection and/or diagnosis of SARS-CoV-2 by FDA under an Emergency Use Authorization (EUA). This EUA will remain in effect (meaning this test can be used) for the duration of the COVID-19 declaration under Section 564(b)(1) of the Act, 21 U.S.C. section 360bbb-3(b)(1), unless the authorization is terminated or revoked.  Performed at [facility], [place] [address], [place], [place] [address]     Labs: CBC: Recent Labs  Lab 08/02/22 0121  WBC 10.3  HGB 12.6*  HCT 37.7*  MCV 82.3  PLT 123456   Basic Metabolic Panel: Recent Labs  Lab 08/04/22 0057 08/05/22 0115 08/06/22 1131 08/07/22 0009 08/08/22 0308  NA 126* 127* 125* 125* 126*  K 3.7 4.0 4.2 4.1 5.7*  CL 86* 89* 86* 87* 90*  CO2 28 25 22 23  21*  GLUCOSE 132* 111* 136* 140* 131*  BUN 84* 86* 88* 89* 109*  CREATININE 1.54* 1.71* 1.99* 2.49* 3.39*  CALCIUM 8.8* 8.7* 8.6* 8.5* 8.4*  MG 3.0*  --   --   --   --    Liver Function Tests: Recent Labs  Lab 08/02/22 0121  AST 51*  ALT 29  ALKPHOS 89  BILITOT 1.3*  PROT 6.2*  ALBUMIN 3.2*   CBG: Recent Labs  Lab 08/07/22 1107 08/07/22 1715 08/07/22 2053 08/08/22 0642 08/08/22 1125  GLUCAP 386* 337* 236* 102* 228*    Discharge time spent: greater than 30 minutes.  Signed: Tawni Millers, MD [facility] 08/08/2022

## 2022-08-08 NOTE — Progress Notes (Signed)
Hydrologist [facility]) Hospital Liaison: RN note    Notified by Transition of Care Manger of patient/family request for [facility] services at home after discharge. Chart and patient information under review by [facility] physician.   Writer spoke with Webb Silversmith to initiate education related to hospice philosophy, services and team approach to care.   Webb Silversmith  verbalized understanding of information given. Per discussion, plan is for discharge to home by private vehicle.  Please send signed and completed DNR form home with patient/family. Patient will need prescriptions for discharge comfort medications.    DME needs have been discussed, patient currently does not need any DME.   [facility]erral [facility] aware of the above. Please notify [facility] when patient is ready to leave the unit at discharge. (Call [phone] or [phone] after 5pm.) [facility] information and contact numbers given to [facility]      Please call with any hospice related questions.     Thank you for this referral.     Clementeen Hoof, RN, [facility] (listed on AMION under Hospice and [place] [facility] [id number]  [phone]

## 2022-08-08 NOTE — Progress Notes (Signed)
Physical Therapy Treatment Patient Details Name: Aaron Sosa MRN: [id number] DOB: 01-04-1941 Today's Date: 08/08/2022   History of Present Illness Pt is an 81 y.o. male admitted 07/31/22 with weakness and weight loss. Workup for acute on chronic CHF, hypotension, possible over-diuresis at home. PMH includes arthritis, CHF, DM, HOH, obesity, myalgia.    PT Comments    Pt received supine pleasant and agreeable to session with continued progress towards acute goals. Pt grossly min guard for gait training and transfers sit<>stand, however pt continues to fatigue quickly and requires cues throughout for safety and sequencing. Pt able to complete 5x STS from low recliner and complete standing therex with fair tolerance, pt with episode of bowel incontinence and able to maintain standing for increased time for peri-care. Pt daughter present and supportive throughout session. Pt continues to benefit from skilled PT services to progress toward functional mobility goals.    Recommendations for follow up therapy are one component of a multi-disciplinary discharge planning process, led by the attending physician.  Recommendations may be updated based on patient status, additional functional criteria and insurance authorization.  Follow Up Recommendations  Home health PT     Assistance Recommended at Discharge Frequent or constant Supervision/Assistance  Patient can return home with the following A little help with bathing/dressing/bathroom;Assistance with cooking/housework;Direct supervision/assist for medications management;Assist for transportation;Help with stairs or ramp for entrance   Equipment Recommendations  None recommended by PT    Recommendations for Other Services       Precautions / Restrictions Precautions Precautions: Fall;Other (comment) Precaution Comments: bladder/bowel incontinence with mobility; hypotensive Restrictions Weight Bearing Restrictions: No     Mobility  Bed  Mobility Overal bed mobility: Needs Assistance Bed Mobility: Supine to Sit     Supine to sit: Min guard     General bed mobility comments: min guard for safety    Transfers Overall transfer level: Needs assistance Equipment used: Rolling walker (2 wheels) Transfers: Sit to/from Stand Sit to Stand: Min assist, Min guard           General transfer comment: completed x5 throughout session, cues to scoot to edge of chair and for rocking technique    Ambulation/Gait Ambulation/Gait assistance: Min guard Gait Distance (Feet): 15 Feet Assistive device: Rolling walker (2 wheels) Gait Pattern/deviations: Step-through pattern, Decreased stride length, Trunk flexed Gait velocity: Decreased     General Gait Details: slow guarded gait secondary to bil foot pain, cues for RW proximity and upright posture, pt continues to fatigue quickly   Stairs             Wheelchair Mobility    Modified Rankin (Stroke Patients Only)       Balance Overall balance assessment: Needs assistance Sitting-balance support: Feet supported, No upper extremity supported Sitting balance-Leahy Scale: Good     Standing balance support: During functional activity, Single extremity supported, Bilateral upper extremity supported Standing balance-Leahy Scale: Poor Standing balance comment: reliant on UE support; assist provided for pericare due to incontinence                            Cognition Arousal/Alertness: Awake/alert Behavior During Therapy: WFL for tasks assessed/performed Overall Cognitive Status: Within Functional Limits for tasks assessed                                 General Comments: limited by [facility]  Exercises General Exercises - Lower Extremity Hip Flexion/Marching: Standing, 20 reps, Both Other Exercises Other Exercises: STS x5 from low recliner    General Comments General comments (skin integrity, edema, etc.): VSS on RA, pt without  c/o dizziness/lightheadedness      Pertinent Vitals/Pain Pain Assessment Pain Assessment: Faces Faces Pain Scale: Hurts little more Pain Location: bil feet Pain Descriptors / Indicators: Sore, Grimacing Pain Intervention(s): Monitored during session, Limited activity within patient's tolerance, Repositioned    Home Living                          Prior Function            PT Goals (current goals can now be found in the care plan section) Acute Rehab PT Goals Patient Stated Goal: return home with assist from family PT Goal Formulation: With patient/family Time For Goal Achievement: 08/17/22    Frequency    Min 3X/week      PT Plan Current plan remains appropriate    Co-evaluation              AM-PAC PT "6 Clicks" Mobility   Outcome Measure  Help needed turning from your back to your side while in a flat bed without using bedrails?: None Help needed moving from lying on your back to sitting on the side of a flat bed without using bedrails?: A Little Help needed moving to and from a bed to a chair (including a wheelchair)?: A Little Help needed standing up from a chair using your arms (e.g., wheelchair or bedside chair)?: A Little Help needed to walk in hospital room?: A Little Help needed climbing 3-5 steps with a railing? : Total 6 Click Score: 17    End of Session   Activity Tolerance: Patient tolerated treatment well Patient left: with call bell/phone within reach;in chair;with family/visitor present Nurse Communication: Mobility status PT Visit Diagnosis: Other abnormalities of gait and mobility (R26.89);Muscle weakness (generalized) (M62.81)     Time: [id number] PT Time Calculation (min) (ACUTE ONLY): 26 min  Charges:  $Gait Training: 8-22 mins $Therapeutic Activity: 8-22 mins                     Gretna Bergin R. PTA Acute Rehabilitation Services Office: [place] 08/08/2022, 10:34 AM

## 2022-08-08 NOTE — Consult Note (Signed)
  WOC consult:  WOC consult requested for possible Una boots.  Performed remotely after review of progress notes and photos in the EMR. WOC consult was already performed on 10/3. Secure chat message sent to the primary team as follows: "The [facility] team received a consult for possible Una boots.  I would not recommend them for 2 reasons; the patient has necrotic wounds which would be covered for a week at a time, and also they do not have an ABI, which would be necessary to assess perfusion status prior to application.  The [facility] team provided topical treatment orders on 10/3 and I would continue the present plan of care as ordered by my partner at that time.  Thank-you"  Please re-consult if further assistance is needed.  Thank-you,  Julien Girt MSN, [place], [place], [place], [place]

## 2022-08-08 NOTE — Plan of Care (Signed)
  Problem: Education: Goal: Understanding of cardiac disease, CV risk reduction, and recovery process will improve Outcome: Progressing   Problem: Activity: Goal: Ability to tolerate increased activity will improve 08/08/2022 0033 by Jorge Mandril, RN Outcome: Progressing 08/08/2022 0031 by Jorge Mandril, RN Outcome: Progressing   Problem: Cardiac: Goal: Ability to achieve and maintain adequate cardiovascular perfusion will improve 08/08/2022 0033 by Jorge Mandril, RN Outcome: Progressing 08/08/2022 0031 by Jorge Mandril, RN Outcome: Progressing   Problem: Health Behavior/Discharge Planning: Goal: Ability to safely manage health-related needs after discharge will improve 08/08/2022 0033 by Jorge Mandril, RN Outcome: Progressing 08/08/2022 0031 by Jorge Mandril, RN Outcome: Progressing

## 2022-08-11 ENCOUNTER — Other Ambulatory Visit: Payer: Self-pay

## 2022-08-11 MED ORDER — BASAGLAR KWIKPEN 100 UNIT/ML ~~LOC~~ SOPN
8.0000 [IU] | PEN_INJECTOR | Freq: Every day | SUBCUTANEOUS | 0 refills | Status: AC
  Filled 2022-08-11: qty 3, 37d supply, fill #0

## 2022-08-14 ENCOUNTER — Other Ambulatory Visit: Payer: Self-pay

## 2022-08-15 ENCOUNTER — Other Ambulatory Visit: Payer: Self-pay

## 2022-08-16 ENCOUNTER — Other Ambulatory Visit: Payer: Self-pay

## 2022-08-17 ENCOUNTER — Other Ambulatory Visit: Payer: Self-pay

## 2022-08-18 ENCOUNTER — Ambulatory Visit: Payer: Medicare HMO | Admitting: Family

## 2022-08-21 ENCOUNTER — Other Ambulatory Visit: Payer: Self-pay

## 2022-09-12 ENCOUNTER — Other Ambulatory Visit: Payer: Self-pay

## 2022-09-12 MED ORDER — INSULIN GLARGINE-YFGN 100 UNIT/ML ~~LOC~~ SOPN
8.0000 [IU] | PEN_INJECTOR | Freq: Every day | SUBCUTANEOUS | 0 refills | Status: AC
  Filled 2022-09-12: qty 15, 150d supply, fill #0
  Filled 2022-09-14: qty 3, 30d supply, fill #0

## 2022-09-12 MED ORDER — UNIFINE PENTIPS 31G X 5 MM MISC
0 refills | Status: AC
  Filled 2022-09-12: qty 100, 90d supply, fill #0

## 2022-09-14 ENCOUNTER — Other Ambulatory Visit: Payer: Self-pay

## 2022-09-26 ENCOUNTER — Other Ambulatory Visit: Payer: Self-pay

## 2022-09-29 DEATH — deceased

## 2023-07-13 IMAGING — CT CT ABD-PELV W/ CM
3 of 8 series · 11 of 46 positions shown, 18 images · IV contrast (APPLIED)
Comparison: None Available.

CLINICAL DATA: Bowel obstruction suspected

EXAM:
CT ABDOMEN AND PELVIS WITH CONTRAST
TECHNIQUE: Multidetector CT imaging of the abdomen and pelvis was performed
using the standard protocol following bolus administration of
intravenous contrast.

[Series 4: abdomen 5.0 · axial · 0.98mm/px · z∈[-364,-34]mm · 7 of 90 slices shown, 12 images]
[im 12/90  soft-tissue]
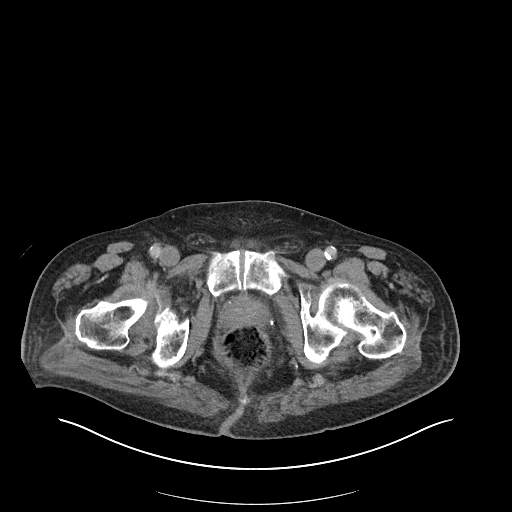
[im 12/90  bone]
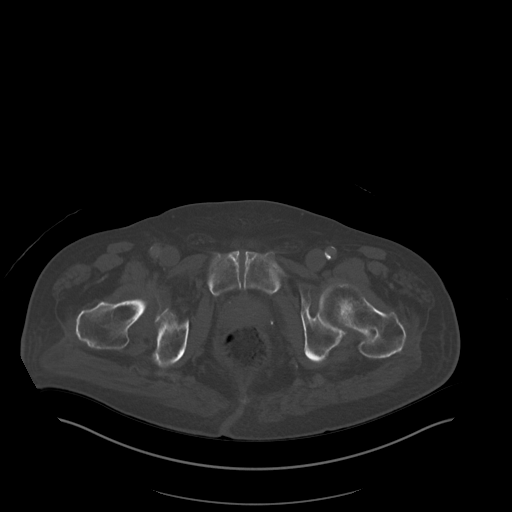
[im 23/90  soft-tissue]
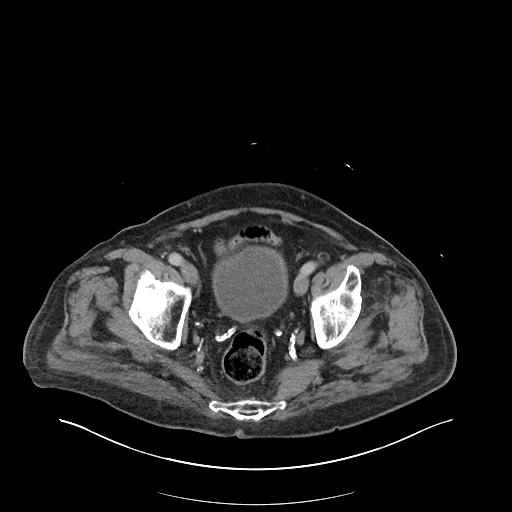
[im 34/90  soft-tissue]
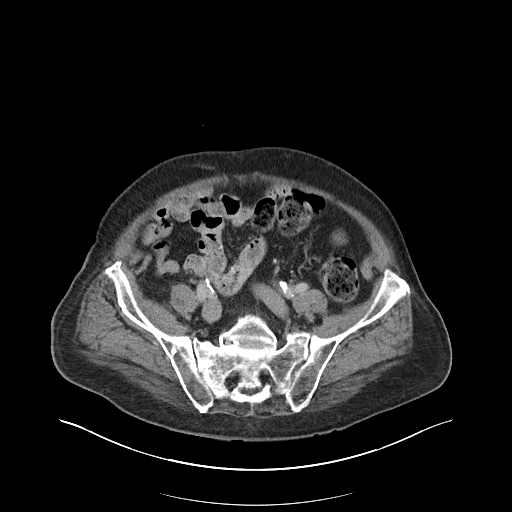
[im 45/90  soft-tissue]
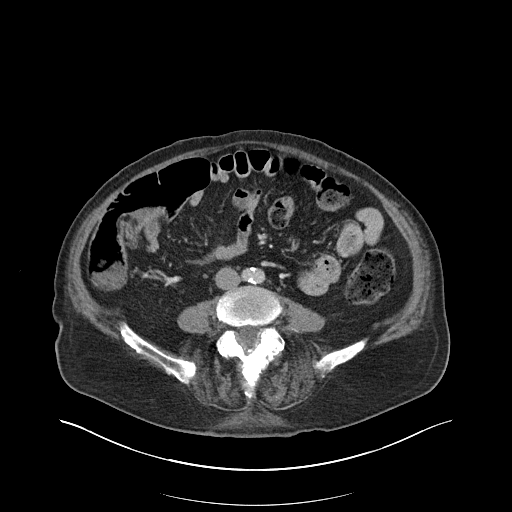
[im 45/90  lung]
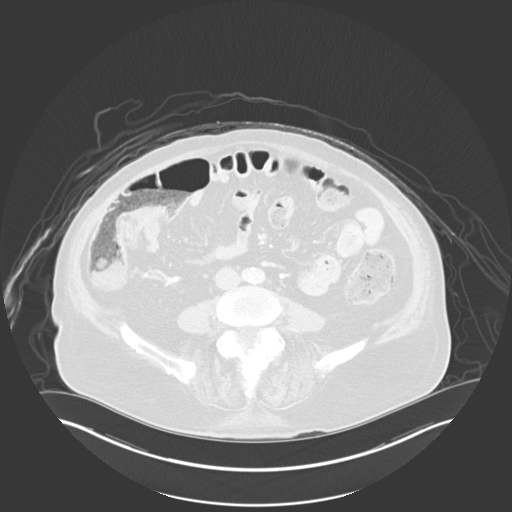
[im 56/90  soft-tissue]
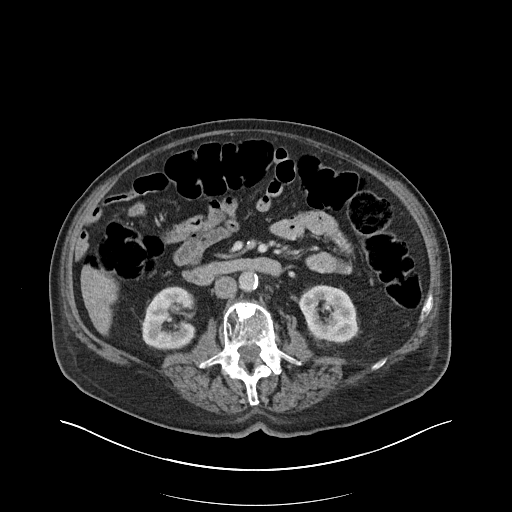
[im 56/90  lung]
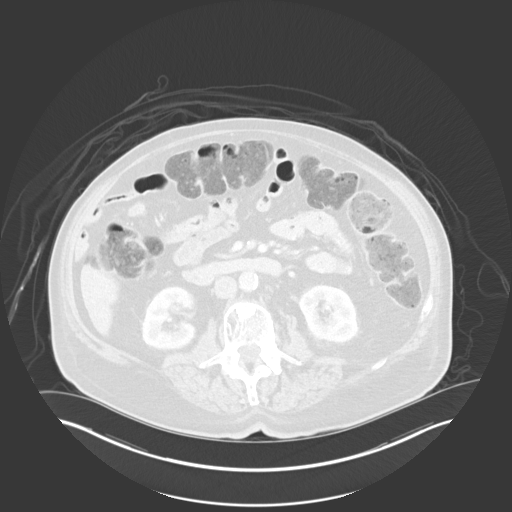
[im 67/90  soft-tissue]
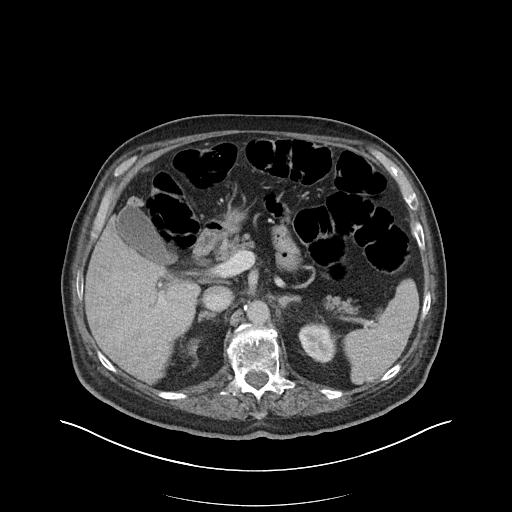
[im 67/90  lung]
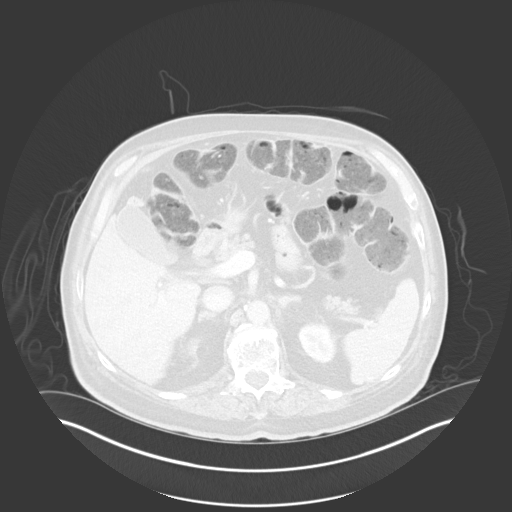
[im 78/90  soft-tissue]
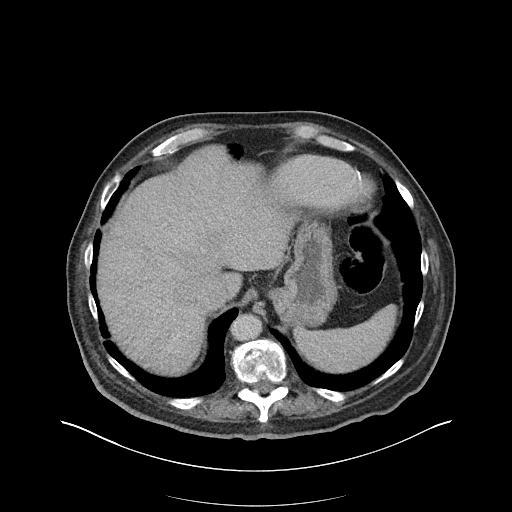
[im 78/90  lung]
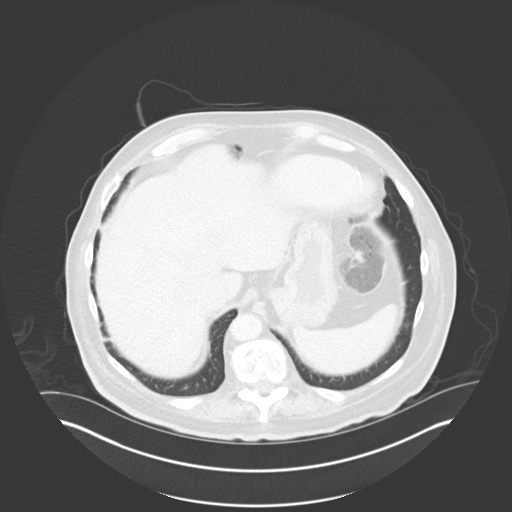

[Series 10: abdomen 3.0 mpr cor · coronal · 0.84mm/px · 3 of 111 slices shown, 4 images]
[im 37/111  soft-tissue]
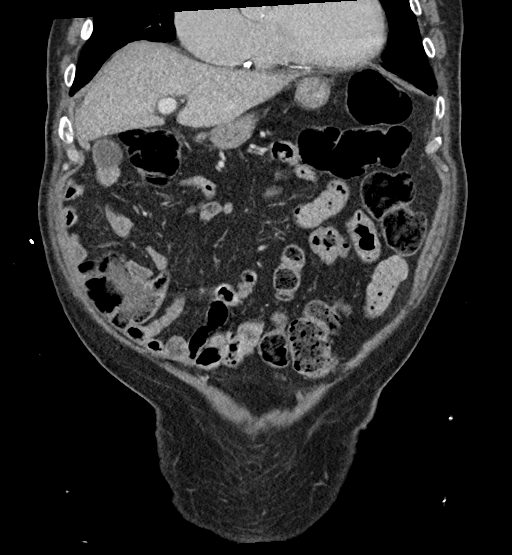
[im 49/111  soft-tissue]
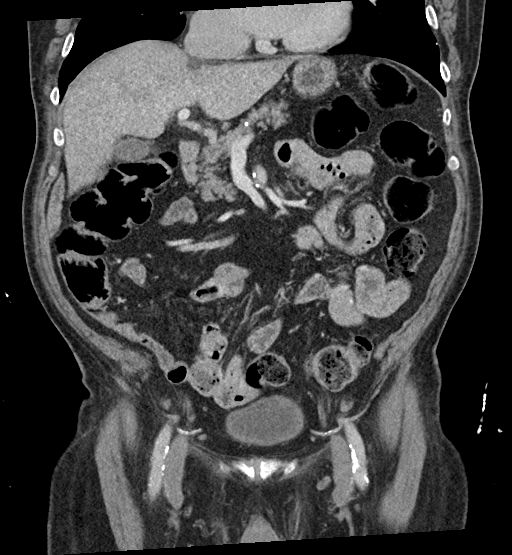
[im 49/111  bone]
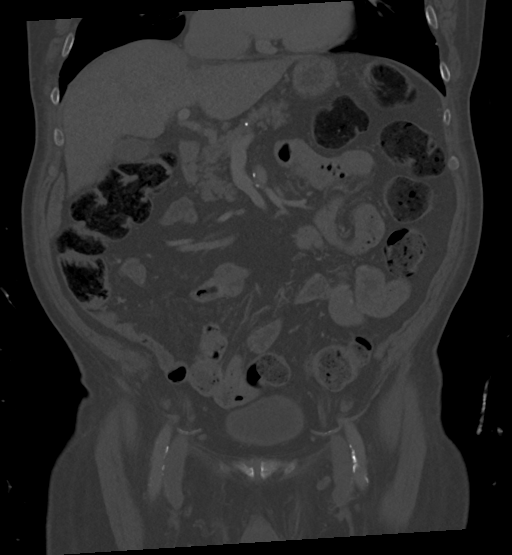
[im 62/111  soft-tissue]
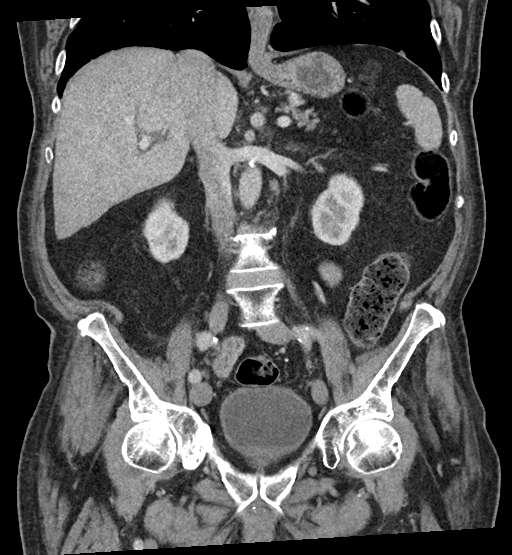

[Series 11: abdomen 3.0 mpr sag · sagittal · 0.69mm/px · 1 of 139 slices shown, 2 images]
[im 47/139  soft-tissue]
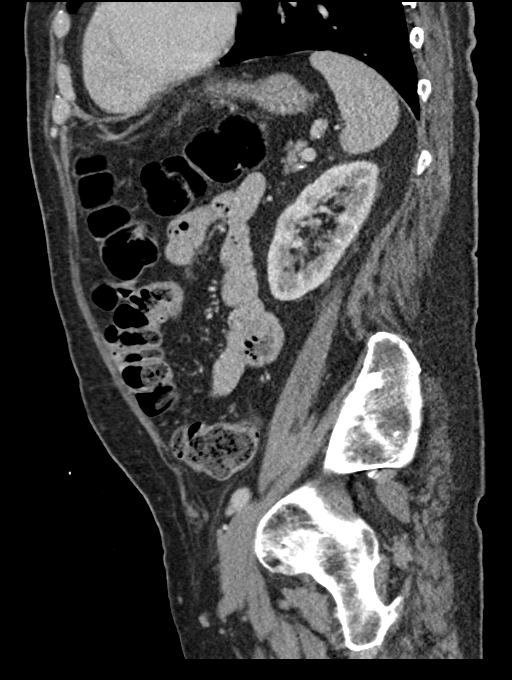
[im 47/139  bone]
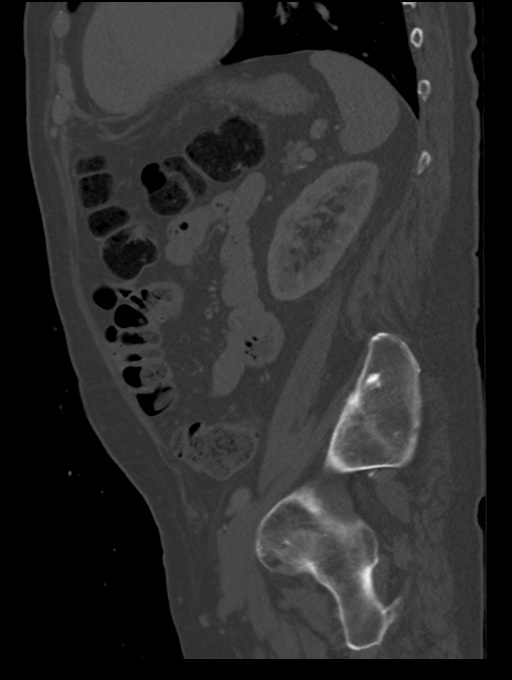

[11 of 46 positions shown; findings below may reference images not displayed]

RADIATION DOSE REDUCTION: This exam was performed according to the
departmental dose-optimization program which includes automated
exposure control, adjustment of the mA and/or kV according to
patient size and/or use of iterative reconstruction technique.

CONTRAST:  80mL OMNIPAQUE IOHEXOL 300 MG/ML  SOLN
FINDINGS: Lower chest: Cardiomegaly. Coronary artery and aortic
calcifications. No acute abnormality.

Hepatobiliary: No focal hepatic abnormality. Gallbladder
unremarkable.

Pancreas: No focal abnormality or ductal dilatation.

Spleen: No focal abnormality.  Normal size.

Adrenals/Urinary Tract: No adrenal abnormality. No focal renal
abnormality. No stones or hydronephrosis. Urinary bladder is
unremarkable.

Stomach/Bowel: Stomach, large and small bowel grossly unremarkable.

Vascular/Lymphatic: Aortic atherosclerosis. No evidence of aneurysm
or adenopathy.

Reproductive: No visible focal abnormality.

Other: No free fluid or free air.

Musculoskeletal: No acute bony abnormality. Old right superior and
inferior pubic rami fractures. Severe degenerative changes in the
lumbar spine. Bilateral L4 pars defects with grade 1
anterolisthesis. Chronic appearing compression fracture at T10 with
partial fusion across the T10-11 disc space.
IMPRESSION: No acute findings in the abdomen or pelvis.

Cardiomegaly, coronary artery disease.  Aortic atherosclerosis.
